# Patient Record
Sex: Male | Born: 1935 | Race: White | Hispanic: No | Marital: Married | Smoking: Former smoker
Health system: Southern US, Community
[De-identification: ages and names within clinical notes are randomized; demographics above are authoritative.]

## PROBLEM LIST (undated history)

## (undated) DIAGNOSIS — Z720 Tobacco use: Secondary | ICD-10-CM

## (undated) DIAGNOSIS — I10 Essential (primary) hypertension: Secondary | ICD-10-CM

## (undated) DIAGNOSIS — C801 Malignant (primary) neoplasm, unspecified: Secondary | ICD-10-CM

## (undated) DIAGNOSIS — R0602 Shortness of breath: Secondary | ICD-10-CM

## (undated) DIAGNOSIS — C19 Malignant neoplasm of rectosigmoid junction: Secondary | ICD-10-CM

## (undated) DIAGNOSIS — Z433 Encounter for attention to colostomy: Secondary | ICD-10-CM

## (undated) DIAGNOSIS — E1140 Type 2 diabetes mellitus with diabetic neuropathy, unspecified: Secondary | ICD-10-CM

## (undated) DIAGNOSIS — I519 Heart disease, unspecified: Secondary | ICD-10-CM

## (undated) DIAGNOSIS — I714 Abdominal aortic aneurysm, without rupture: Secondary | ICD-10-CM

## (undated) DIAGNOSIS — M1990 Unspecified osteoarthritis, unspecified site: Secondary | ICD-10-CM

## (undated) DIAGNOSIS — J449 Chronic obstructive pulmonary disease, unspecified: Secondary | ICD-10-CM

## (undated) DIAGNOSIS — C183 Malignant neoplasm of hepatic flexure: Secondary | ICD-10-CM

## (undated) DIAGNOSIS — N179 Acute kidney failure, unspecified: Secondary | ICD-10-CM

## (undated) DIAGNOSIS — I4891 Unspecified atrial fibrillation: Secondary | ICD-10-CM

## (undated) HISTORY — DX: Heart disease, unspecified: I51.9

## (undated) HISTORY — DX: Shortness of breath: R06.02

## (undated) HISTORY — DX: Malignant (primary) neoplasm, unspecified: C80.1

## (undated) HISTORY — DX: Malignant neoplasm of hepatic flexure: C18.3

## (undated) HISTORY — DX: Unspecified osteoarthritis, unspecified site: M19.90

## (undated) HISTORY — DX: Essential (primary) hypertension: I10

## (undated) HISTORY — PX: COSMETIC SURGERY: SHX468

## (undated) HISTORY — PX: BACK SURGERY: SHX140

## (undated) HISTORY — DX: Malignant neoplasm of rectosigmoid junction: C19

## (undated) HISTORY — PX: OTHER SURGICAL HISTORY: SHX169

---

## 1990-05-04 HISTORY — PX: OTHER SURGICAL HISTORY: SHX169

## 1998-10-15 ENCOUNTER — Ambulatory Visit (HOSPITAL_BASED_OUTPATIENT_CLINIC_OR_DEPARTMENT_OTHER): Admission: RE | Admit: 1998-10-15 | Discharge: 1998-10-15 | Payer: Self-pay | Admitting: Orthopaedic Surgery

## 2003-01-05 ENCOUNTER — Encounter: Payer: Self-pay | Admitting: Cardiovascular Disease

## 2003-01-05 ENCOUNTER — Encounter: Admission: RE | Admit: 2003-01-05 | Discharge: 2003-01-05 | Payer: Self-pay | Admitting: Cardiovascular Disease

## 2005-01-23 ENCOUNTER — Ambulatory Visit (HOSPITAL_COMMUNITY): Admission: RE | Admit: 2005-01-23 | Discharge: 2005-01-23 | Payer: Self-pay | Admitting: Neurology

## 2005-02-11 ENCOUNTER — Ambulatory Visit (HOSPITAL_COMMUNITY): Admission: RE | Admit: 2005-02-11 | Discharge: 2005-02-11 | Payer: Self-pay | Admitting: Internal Medicine

## 2009-12-20 ENCOUNTER — Encounter: Payer: Self-pay | Admitting: Internal Medicine

## 2010-02-01 HISTORY — PX: EXPLORATORY LAPAROTOMY: SUR591

## 2010-02-01 HISTORY — PX: OTHER SURGICAL HISTORY: SHX169

## 2010-02-01 HISTORY — PX: COLOSTOMY: SHX63

## 2010-02-06 ENCOUNTER — Ambulatory Visit: Payer: Self-pay | Admitting: Internal Medicine

## 2010-02-06 ENCOUNTER — Encounter (INDEPENDENT_AMBULATORY_CARE_PROVIDER_SITE_OTHER): Payer: Self-pay | Admitting: *Deleted

## 2010-02-06 DIAGNOSIS — K921 Melena: Secondary | ICD-10-CM

## 2010-02-06 DIAGNOSIS — R634 Abnormal weight loss: Secondary | ICD-10-CM

## 2010-02-06 DIAGNOSIS — K6289 Other specified diseases of anus and rectum: Secondary | ICD-10-CM

## 2010-02-06 DIAGNOSIS — R198 Other specified symptoms and signs involving the digestive system and abdomen: Secondary | ICD-10-CM

## 2010-02-06 LAB — CONVERTED CEMR LAB
ALT: 15 units/L (ref 0–53)
AST: 21 units/L (ref 0–37)
Albumin: 3.1 g/dL — ABNORMAL LOW (ref 3.5–5.2)
Alkaline Phosphatase: 69 units/L (ref 39–117)
BUN: 19 mg/dL (ref 6–23)
Basophils Absolute: 0.1 10*3/uL (ref 0.0–0.1)
Basophils Relative: 0.7 % (ref 0.0–3.0)
CO2: 28 meq/L (ref 19–32)
Calcium: 9 mg/dL (ref 8.4–10.5)
Chloride: 102 meq/L (ref 96–112)
Creatinine, Ser: 1.1 mg/dL (ref 0.4–1.5)
Eosinophils Absolute: 0.3 10*3/uL (ref 0.0–0.7)
Eosinophils Relative: 3.6 % (ref 0.0–5.0)
GFR calc non Af Amer: 73.22 mL/min (ref >60)
Glucose, Bld: 92 mg/dL (ref 70–99)
HCT: 35.3 % — ABNORMAL LOW (ref 39.0–52.0)
Hemoglobin: 12 g/dL — ABNORMAL LOW (ref 13.0–17.0)
INR: 3.2 — ABNORMAL HIGH (ref 0.8–1.0)
Lymphocytes Relative: 21.1 % (ref 12.0–46.0)
Lymphs Abs: 1.6 10*3/uL (ref 0.7–4.0)
MCHC: 34.1 g/dL (ref 30.0–36.0)
MCV: 90.9 fL (ref 78.0–100.0)
Monocytes Absolute: 0.6 10*3/uL (ref 0.1–1.0)
Monocytes Relative: 7.6 % (ref 3.0–12.0)
Neutro Abs: 5.2 10*3/uL (ref 1.4–7.7)
Neutrophils Relative %: 67 % (ref 43.0–77.0)
Platelets: 246 10*3/uL (ref 150.0–400.0)
Potassium: 4.5 meq/L (ref 3.5–5.1)
Prothrombin Time: 33.8 s — ABNORMAL HIGH (ref 9.7–11.8)
RBC: 3.88 M/uL — ABNORMAL LOW (ref 4.22–5.81)
RDW: 16 % — ABNORMAL HIGH (ref 11.5–14.6)
Sodium: 136 meq/L (ref 135–145)
Total Bilirubin: 0.5 mg/dL (ref 0.3–1.2)
Total Protein: 6.4 g/dL (ref 6.0–8.3)
WBC: 7.7 10*3/uL (ref 4.5–10.5)

## 2010-02-07 ENCOUNTER — Encounter: Payer: Self-pay | Admitting: Internal Medicine

## 2010-02-07 ENCOUNTER — Ambulatory Visit: Payer: Self-pay | Admitting: Pulmonary Disease

## 2010-02-07 ENCOUNTER — Ambulatory Visit: Payer: Self-pay | Admitting: Gastroenterology

## 2010-02-07 ENCOUNTER — Telehealth: Payer: Self-pay | Admitting: Internal Medicine

## 2010-02-07 ENCOUNTER — Encounter (INDEPENDENT_AMBULATORY_CARE_PROVIDER_SITE_OTHER): Payer: Self-pay | Admitting: Internal Medicine

## 2010-02-07 ENCOUNTER — Inpatient Hospital Stay (HOSPITAL_COMMUNITY): Admission: EM | Admit: 2010-02-07 | Discharge: 2010-02-24 | Payer: Self-pay | Admitting: Emergency Medicine

## 2010-02-09 ENCOUNTER — Encounter (INDEPENDENT_AMBULATORY_CARE_PROVIDER_SITE_OTHER): Payer: Self-pay | Admitting: Internal Medicine

## 2010-02-10 ENCOUNTER — Encounter (INDEPENDENT_AMBULATORY_CARE_PROVIDER_SITE_OTHER): Payer: Self-pay | Admitting: Internal Medicine

## 2010-02-10 ENCOUNTER — Ambulatory Visit: Payer: Self-pay | Admitting: Vascular Surgery

## 2010-02-11 ENCOUNTER — Ambulatory Visit: Payer: Self-pay | Admitting: Oncology

## 2010-02-11 ENCOUNTER — Encounter: Payer: Self-pay | Admitting: Gastroenterology

## 2010-02-13 ENCOUNTER — Encounter (INDEPENDENT_AMBULATORY_CARE_PROVIDER_SITE_OTHER): Payer: Self-pay | Admitting: Internal Medicine

## 2010-03-03 ENCOUNTER — Encounter: Payer: Self-pay | Admitting: Internal Medicine

## 2010-03-04 ENCOUNTER — Ambulatory Visit: Payer: Self-pay | Admitting: Oncology

## 2010-03-07 ENCOUNTER — Ambulatory Visit: Payer: Self-pay | Admitting: Oncology

## 2010-03-07 ENCOUNTER — Encounter: Payer: Self-pay | Admitting: Internal Medicine

## 2010-03-07 ENCOUNTER — Ambulatory Visit
Admission: RE | Admit: 2010-03-07 | Discharge: 2010-05-30 | Payer: Self-pay | Source: Home / Self Care | Attending: Radiation Oncology | Admitting: Radiation Oncology

## 2010-03-07 LAB — COMPREHENSIVE METABOLIC PANEL
ALT: 10 U/L (ref 0–53)
BUN: 16 mg/dL (ref 6–23)
CO2: 30 mEq/L (ref 19–32)
Creatinine, Ser: 1.25 mg/dL (ref 0.40–1.50)
Total Bilirubin: 0.5 mg/dL (ref 0.3–1.2)

## 2010-03-07 LAB — CBC WITH DIFFERENTIAL/PLATELET
BASO%: 0.8 % (ref 0.0–2.0)
EOS%: 4.1 % (ref 0.0–7.0)
HCT: 32 % — ABNORMAL LOW (ref 38.4–49.9)
LYMPH%: 21.2 % (ref 14.0–49.0)
MCH: 30.5 pg (ref 27.2–33.4)
MCHC: 33.1 g/dL (ref 32.0–36.0)
MONO#: 0.6 10*3/uL (ref 0.1–0.9)
NEUT%: 65.4 % (ref 39.0–75.0)
Platelets: 267 10*3/uL (ref 140–400)

## 2010-03-07 LAB — CEA: CEA: 12.7 ng/mL — ABNORMAL HIGH (ref 0.0–5.0)

## 2010-04-04 ENCOUNTER — Encounter: Payer: Self-pay | Admitting: Internal Medicine

## 2010-04-04 LAB — CBC WITH DIFFERENTIAL/PLATELET
BASO%: 0.8 % (ref 0.0–2.0)
EOS%: 3.2 % (ref 0.0–7.0)
HCT: 34.7 % — ABNORMAL LOW (ref 38.4–49.9)
LYMPH%: 24.3 % (ref 14.0–49.0)
MCH: 30.7 pg (ref 27.2–33.4)
MCHC: 33.5 g/dL (ref 32.0–36.0)
NEUT%: 62.2 % (ref 39.0–75.0)
Platelets: 171 10*3/uL (ref 140–400)

## 2010-04-04 LAB — COMPREHENSIVE METABOLIC PANEL
ALT: 12 U/L (ref 0–53)
AST: 15 U/L (ref 0–37)
Creatinine, Ser: 1.09 mg/dL (ref 0.40–1.50)
Total Bilirubin: 0.6 mg/dL (ref 0.3–1.2)

## 2010-04-18 ENCOUNTER — Ambulatory Visit: Payer: Self-pay | Admitting: Oncology

## 2010-04-24 ENCOUNTER — Encounter: Payer: Self-pay | Admitting: Internal Medicine

## 2010-05-16 ENCOUNTER — Encounter: Payer: Self-pay | Admitting: Internal Medicine

## 2010-05-16 LAB — PROTIME-INR
INR: 3.4 (ref 2.00–3.50)
Protime: 40.8 Seconds — ABNORMAL HIGH (ref 10.6–13.4)

## 2010-05-16 LAB — CBC WITH DIFFERENTIAL/PLATELET
BASO%: 0.5 % (ref 0.0–2.0)
Basophils Absolute: 0 10*3/uL (ref 0.0–0.1)
EOS%: 4.3 % (ref 0.0–7.0)
Eosinophils Absolute: 0.2 10*3/uL (ref 0.0–0.5)
HCT: 31.5 % — ABNORMAL LOW (ref 38.4–49.9)
HGB: 10.9 g/dL — ABNORMAL LOW (ref 13.0–17.1)
LYMPH%: 8.1 % — ABNORMAL LOW (ref 14.0–49.0)
MCH: 32.6 pg (ref 27.2–33.4)
MCHC: 34.6 g/dL (ref 32.0–36.0)
MCV: 94.3 fL (ref 79.3–98.0)
MONO#: 0.5 10*3/uL (ref 0.1–0.9)
MONO%: 10.8 % (ref 0.0–14.0)
NEUT#: 3.4 10*3/uL (ref 1.5–6.5)
NEUT%: 76.3 % — ABNORMAL HIGH (ref 39.0–75.0)
Platelets: 151 10*3/uL (ref 140–400)
RBC: 3.35 10*6/uL — ABNORMAL LOW (ref 4.20–5.82)
RDW: 19.9 % — ABNORMAL HIGH (ref 11.0–14.6)
WBC: 4.5 10*3/uL (ref 4.0–10.3)
lymph#: 0.4 10*3/uL — ABNORMAL LOW (ref 0.9–3.3)

## 2010-05-16 LAB — COMPREHENSIVE METABOLIC PANEL
ALT: 9 U/L (ref 0–53)
AST: 10 U/L (ref 0–37)
Albumin: 3.9 g/dL (ref 3.5–5.2)
Alkaline Phosphatase: 56 U/L (ref 39–117)
BUN: 19 mg/dL (ref 6–23)
CO2: 24 mEq/L (ref 19–32)
Calcium: 8.9 mg/dL (ref 8.4–10.5)
Chloride: 105 mEq/L (ref 96–112)
Creatinine, Ser: 1.14 mg/dL (ref 0.40–1.50)
Glucose, Bld: 116 mg/dL — ABNORMAL HIGH (ref 70–99)
Potassium: 4.1 mEq/L (ref 3.5–5.3)
Sodium: 140 mEq/L (ref 135–145)
Total Bilirubin: 0.7 mg/dL (ref 0.3–1.2)
Total Protein: 6.5 g/dL (ref 6.0–8.3)

## 2010-05-27 ENCOUNTER — Ambulatory Visit (HOSPITAL_COMMUNITY)
Admission: RE | Admit: 2010-05-27 | Discharge: 2010-05-27 | Payer: Self-pay | Source: Home / Self Care | Attending: Oncology | Admitting: Oncology

## 2010-05-27 ENCOUNTER — Ambulatory Visit: Payer: Self-pay | Admitting: Oncology

## 2010-05-29 ENCOUNTER — Encounter: Payer: Self-pay | Admitting: Internal Medicine

## 2010-05-29 LAB — CBC WITH DIFFERENTIAL/PLATELET
BASO%: 0.1 % (ref 0.0–2.0)
Basophils Absolute: 0 10*3/uL (ref 0.0–0.1)
EOS%: 3.7 % (ref 0.0–7.0)
Eosinophils Absolute: 0.1 10*3/uL (ref 0.0–0.5)
HCT: 31.3 % — ABNORMAL LOW (ref 38.4–49.9)
HGB: 10.8 g/dL — ABNORMAL LOW (ref 13.0–17.1)
MCH: 33.2 pg (ref 27.2–33.4)
MCHC: 34.5 g/dL (ref 32.0–36.0)
MCV: 96.2 fL (ref 79.3–98.0)
MONO%: 9 % (ref 0.0–14.0)
NEUT%: 80.1 % — ABNORMAL HIGH (ref 39.0–75.0)
Platelets: 114 10*3/uL — ABNORMAL LOW (ref 140–400)
RDW: 20.8 % — ABNORMAL HIGH (ref 11.0–14.6)
WBC: 3.6 10*3/uL — ABNORMAL LOW (ref 4.0–10.3)

## 2010-05-29 LAB — COMPREHENSIVE METABOLIC PANEL
ALT: 10 U/L (ref 0–53)
AST: 14 U/L (ref 0–37)
Albumin: 3.5 g/dL (ref 3.5–5.2)
Alkaline Phosphatase: 56 U/L (ref 39–117)
BUN: 14 mg/dL (ref 6–23)
CO2: 28 mEq/L (ref 19–32)
Calcium: 8.5 mg/dL (ref 8.4–10.5)
Chloride: 104 mEq/L (ref 96–112)
Creatinine, Ser: 1.13 mg/dL (ref 0.40–1.50)
Potassium: 3.7 mEq/L (ref 3.5–5.3)
Total Bilirubin: 0.6 mg/dL (ref 0.3–1.2)

## 2010-05-29 LAB — PROTIME-INR: INR: 1.8 — ABNORMAL LOW (ref 2.00–3.50)

## 2010-06-03 NOTE — Procedures (Signed)
Summary: Colonoscopy  Patient: [name] Note: All result statuses are Final unless otherwise noted.  Tests: (1) Colonoscopy (COL)   COL Colonoscopy           DONE     [facility]     [address] [place], [place]  [address]           COLONOSCOPY PROCEDURE REPORT           PATIENT:  Vincent [name], Vincent [name]  MR#:  [id number]     BIRTHDATE:  12-05-1932, 76 yrs. old  GENDER:  male     ENDOSCOPIST:  Judie Petit T. Russella Dar, MD, [facility]     Referred by:  Tracey Harries, M.D.     PROCEDURE DATE:  02/07/2010     PROCEDURE:  Incomplete colonoscopy with decompression and     decompression tube placement     ASA CLASS:  Class III     INDICATIONS:  1) Abnormal CT of abdomen with  rectosigmoid mass     and LBO  2) hematochezia     MEDICATIONS:   Fentanyl 25 mcg IV, Versed 1.5 mg IV     DESCRIPTION OF PROCEDURE:   After the risks benefits and     alternatives of the procedure were thoroughly explained, informed     consent was obtained.  Digital rectal exam was performed and     revealed no abnormalities.  The Pentax Colonoscope [id number]     endoscope was introduced through the anus and advanced to the     sigmoid colon, limited by poor preparation, unable to advance     beyond the sigmoid colon due to solid stool.  The quality of the     prep was poor, using MoviPrep.  The instrument was then slowly     withdrawn as the colon was fully examined.     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 It was ulcerated, irregularly shaped and malignant     appearing measuring about 5 cm in length. Multiple biopsies were     obtained and sent to pathology.  Normal colon otherwise in the     sigmoid colon with a large amout of solid stool compromising the     exam. Decompression was performed and then a 10 fr decompression     tube was placed 20-25 cm proximal to the mass.  Retroflexed views     in the rectum revealed no abnormalities.  The scope was  then     withdrawn from the patient and the procedure completed.           COMPLICATIONS:  None           ENDOSCOPIC IMPRESSION:     1) Obstructing malignant appearing mass at the rectosigmoid     junction     2) Normal sigmoid colon with decompression and decompression     tube placed           RECOMMENDATIONS:     1) Await pathology results     2) Surgical consult     3) Post procedure abd films           Malcolm T. Russella Dar, MD, [facility]           CC: Stan Head, MD           n.     Rosalie DoctorVenita Lick. Stark at 02/07/2010 02:44  PM           [name], [id number]  Note: An exclamation mark (!) indicates a result that was not dispersed into the flowsheet. Document Creation Date: 02/07/2010 2:45 PM _______________________________________________________________________  (1) Order result status: Final Collection or observation date-time: 02/07/2010 14:22 Requested date-time:  Receipt date-time:  Reported date-time:  Referring Physician:   Ordering Physician: Claudette Head [phone]) Specimen Source:  Source: Launa Grill Order Number: [id number] Lab site:

## 2010-06-03 NOTE — Letter (Signed)
Summary: [facility] Instructions  [facility]  [address] [place], [place] [address]   Phone: [phone]  Fax: [phone]       EMERIL STILLE    75-28-1934    MRN: [id number]      Procedure Day Dorna Bloom: Farrell Ours, 02/07/10     Arrival Time: 7:30 AM      Procedure Time: 8:00 AM    Location of Procedure:                    _X_  [facility] (4th Floor)  PREPARATION FOR COLONOSCOPY WITH MOVIPREP   Starting today do not take any fiber supplements (e.g. Metamucil, Citrucel, and Benefiber).  THE DAY BEFORE YOUR PROCEDURE         THURSDAY, 02/06/10  1.  Drink clear liquids the entire day-NO SOLID FOOD  2.  Do not drink anything colored red or purple.  Avoid juices with pulp.  No orange juice.  3.  Drink at least 64 oz. (8 glasses) of fluid/clear liquids during the day to prevent dehydration and help the prep work efficiently.  CLEAR LIQUIDS INCLUDE: Water Jello Ice Popsicles Tea (sugar ok, no milk/cream) Powdered fruit flavored drinks Coffee (sugar ok, no milk/cream) Gatorade Juice: apple, white grape, white cranberry  Lemonade Clear bullion, consomm, broth Carbonated beverages (any kind) Strained chicken noodle soup Hard Candy                           4.  In the morning, mix first dose of MoviPrep solution:    Empty 1 Pouch A and 1 Pouch B into the disposable container    Add lukewarm drinking water to the top line of the container. Mix to dissolve    Refrigerate (mixed solution should be used within 24 hrs)  5.  Begin drinking the prep at 5:00 p.m. The MoviPrep container is divided by 4 marks.   Every 15 minutes drink the solution down to the next mark (approximately 8 oz) until the full liter is complete.   6.  Follow completed prep with 16 oz of clear liquid of your choice (Nothing red or purple).  Continue to drink clear liquids.  7.  Mix second dose of MoviPrep solution:    Empty 1 Pouch A and 1 Pouch B into the disposable  container    Add lukewarm drinking water to the top line of the container. Mix to dissolve    Refrigerate  Beginning at 9:00 p.m. (5 hours before procedure):         1. Every 15 minutes, drink the solution down to the next mark (approx 8 oz) until the full liter is complete.         2. Follow completed prep with 16 oz. of clear liquid of your choice.    THE DAY OF YOUR PROCEDURE      FRIDAY, 02/07/10  1. You may drink clear liquids until 6:00AM (2 HOURS BEFORE PROCEDURE).  MEDICATION INSTRUCTIONS  Unless otherwise instructed, you should take regular prescription medications with a small sip of water   as early as possible the morning of your procedure.  Stop taking Coumadin on  _ _  (5 days before procedure).  PER DR. Leone Payor DO NOT STOP YOUR COUMADIN.       OTHER INSTRUCTIONS  You will need a responsible adult at least75 years of age to accompany you and drive you home.   This  person must remain in the waiting room during your procedure.  Wear loose fitting clothing that is easily removed.  Leave jewelry and other valuables at home.  However, you may wish to bring a book to read or  an iPod/MP3 player to listen to music as you wait for your procedure to start.  Remove all body piercing jewelry and leave at home.  Total time from sign-in until discharge is approximately 2-3 hours.  You should go home directly after your procedure and rest.  You can resume normal activities the  day after your procedure.  The day of your procedure you should not:   Drive   Make legal decisions   Operate machinery   Drink alcohol   Return to work  You will receive specific instructions about eating, activities and medications before you leave.  The above instructions have been reviewed and explained to me by   _______________________   I fully understand and can verbalize these instructions _____________________________ Date _________

## 2010-06-03 NOTE — Letter (Signed)
Summary: New Patient letter  [facility]  [address] [place], [place] [address]   Phone: [phone]  Fax: [phone]       12/20/2007 MRN: [id number]  Vincent [name] [address] [place], [place]  [address]  Dear Mr. BROADFOOT,  Welcome to the Gastroenterology Division at [facility].    You are scheduled to see Dr.  Leone Payor on 02-06-10 at 10am on the 3rd floor at [facility], [address]  We ask that you try to arrive at our office 15 minutes prior to your appointment time to allow for check-in.  We would like you to complete the enclosed self-administered evaluation form prior to your visit and bring it with you on the day of your appointment.  We will review it with you.  Also, please bring a complete list of all your medications or, if you prefer, bring the medication bottles and we will list them.  Please bring your insurance card so that we may make a copy of it.  If your insurance requires a referral to see a specialist, please bring your referral form from your primary care physician.  Co-payments are due at the time of your visit and may be paid by cash, check or credit card.     Your office visit will consist of a consult with your physician (includes a physical exam), any laboratory testing he/she may order, scheduling of any necessary diagnostic testing (e.g. x-ray, ultrasound, CT-scan), and scheduling of a procedure (e.g. Endoscopy, Colonoscopy) if required.  Please allow enough time on your schedule to allow for any/all of these possibilities.    If you cannot keep your appointment, please call [phone] to cancel or reschedule prior to your appointment date.  This allows [place] the opportunity to schedule an appointment for another patient in need of care.  If you do not cancel or reschedule by 5 p.m. the business day prior to your appointment date, you will be charged a $50.00 late cancellation/no-show fee.    Thank you for choosing [facility] for your medical needs.  We appreciate the opportunity to care for you.  Please visit [place] at our website  to learn more about our practice.                     Sincerely,                                                             The Gastroenterology Division

## 2010-06-03 NOTE — Letter (Signed)
Summary: [facility]  [facility]   Imported By: Sherian Rein 02/21/2010 08:38:05  _____________________________________________________________________  External Attachment:    Type:   Image     Comment:   External Document

## 2010-06-03 NOTE — Letter (Signed)
Summary: Medication List/[facility]  Medication List/[facility]   Imported By: Sherian Rein 02/21/2010 08:36:48  _____________________________________________________________________  External Attachment:    Type:   Image     Comment:   External Document

## 2010-06-03 NOTE — Assessment & Plan Note (Signed)
Summary: SCREEN FOR COLON-ON BLD THNRS/YF   History of Present Illness Visit Type: Initial Consult Primary GI MD: Stan Head MD [facility] Primary Provider: Tracey Harries, MD Requesting Provider: Tracey Harries, MD Chief Complaint: passing blood History of Present Illness:   77 yo wm with recent unusual bleeding inthat he passed a clump of "dried, clooted blood" Has had hemorroids since teenage years and has seen bright red blood on the yissue paper. Lately he has had increased frequency of stools, (new) loose and incomplete defecation. This disturbs sleep also. "I always have gas real bad". It is new and began in the past couple of months. No new meds and no antibiotics. No new pets or foreign travel. He says he was 288# 3 years ago and had lost 100# in that time. He is also having pain in coccyx and rectal area.    GI Review of Systems    Reports bloating and  weight loss.   Weight loss of 8 pounds over 1 week.   Denies abdominal pain, acid reflux, belching, chest pain, dysphagia with liquids, dysphagia with solids, heartburn, loss of appetite, nausea, vomiting, vomiting blood, and  weight gain.      Reports change in bowel habits, fecal incontinence, hemorrhoids, rectal bleeding, and  rectal pain.     Denies anal fissure, black tarry stools, constipation, diarrhea, diverticulosis, heme positive stool, irritable bowel syndrome, jaundice, light color stool, and  liver problems.    Current Medications (verified): 1)  Altace 2.5 Mg Caps (Ramipril) .... Once Daily 2)  Carvedilol 6.25 Mg Tabs (Carvedilol) .... Take 1 Tablet By Mouth Two Times A Day 3)  Aspirin 81 Mg Tabs (Aspirin) .... Take 1 Tablet By Mouth Once A Day 4)  Digoxin 0.125 Mg Tabs (Digoxin) .... Take 1 Tablet By Mouth Once A Day 5)  Warfarin Sodium 5 Mg Tabs (Warfarin Sodium) .... Take 1 Tablet By Mouth Once Daily 4 Days A Week, Take 1/2 Tablet By Mouth Once Daily 3 Days A Week 6)  Omega-3 1000 Mg Caps (Omega-3 Fatty Acids)  .... Take 1 Capsule By Mouth Once A Day 7)  Advair Diskus 250-50 Mcg/dose Aepb (Fluticasone-Salmeterol) .... One Inhalation Every 12 Hours 8)  Combivent 18-103 Mcg/act Aero (Ipratropium-Albuterol) .... Take Two Inhalations Four Times A Day 9)  Simvastatin 40 Mg Tabs (Simvastatin) .... Take 1 Tablet By Mouth Once A Day 10)  Mytussin Ac 100-10 Mg/68ml Syrp (Guaifenesin-Codeine) .... As Needed 11)  Tramadol Hcl 50 Mg Tabs (Tramadol Hcl) .... As Needed  Allergies (verified): 1)  ! * Swordfish  Past History:  Past Medical History: Reviewed history from 02/05/2010 and no changes required. Heart Disease-Cardiomyopathy Thrombocytopenia Arthritis Asthma Atrial Fibrillation COPD Hemorrhoids Hyperlipidemia Hypertension Sleep Apnea  Past Surgical History: Back Surgery 1973, 1986 Knee Arthroscopy 1993  Family History: Family History of Colon Cancer: Father died at 27, dx 12's Family History of Prostate Cancer: Father Family History of Diabetes: Son Family History of Heart Disease: Mother, MI-deceased 62   Social History: Married to [employer],  Part-Time, Engineering geologist for [employer] Daily Caffeine Use 2-4 glasses Patient is a former smoker. quit in 2004 Alcohol Use - no Illicit Drug Use - no Patient does not get regular exercise.   Review of Systems       The patient complains of arthritis/joint pain, fatigue, shortness of breath, sleeping problems, and swelling of feet/legs.         All other ROS negative except as per HPI.   Vital Signs:  Patient profile:   77 year old male Height:      71 inches Weight:      199 pounds BMI:     27.86 Pulse rate:   64 / minute Pulse rhythm:   regular BP sitting:   100 / 58  (left arm) Cuff size:   regular  Vitals Entered By: June McMurray CMA Duncan Dull) (February 06, 2010 9:59 AM) CC: passing blood   Physical Exam  General:  Well developed, well nourished, no acute distress. Eyes:  PERRLA, no icterus. Mouth:   dentures dry, erythematous mucosa Neck:  Supple; no masses or thyromegaly. Lungs:  decreased BS bilateral.  coarse BS bilateral Heart:  Regular rate and rhythm; no murmurs, rubs,  or bruits. Distant sounds Abdomen:  soft, doughy, no HSM/mass BS+ nontender except mild in groin Rectal:  perianal ok hard mass like-effect palpated at dstal edge of finger, watery brown stool  moderately tender Prostate:  not assessed Extremities:  trce edema ankles and pretibial bilateral Neurologic:  Alert and  oriented x3 Skin:  diffusely dry and scaly Cervical Nodes:  No significant cervical or supraclavicular adenopathy.  Inguinal Nodes:  No significant inguinal adenopathy. Psych:  Alert and cooperative. Normal mood and affect.  Labs from earlier in 2011 and office notes from Dr. Everlene Other have been reviewed.Will bve scanned into record. Hgb has been normal as late as 05/2009  Impression & Recommendations:  Problem # 1:  HEMATOCHEZIA (ICD-578.1) Assessment New He needs colonoscopy to sort out. At higher risk with Afib and warfarin. Rectal exam is suggesting rectal cancer. given overall situation and the palpated rectal mass, proceed to colonoscopy tomorrow. bxs should be ok on coumadin   Orders: Colonoscopy (Colon) Risks, benefits,and indications of endoscopic procedure(s) were reviewed with the patient and all questions answered. Increased risks due to warfarin have been explained.  Problem # 2:  RECTAL MASS (ICD-787.99) Assessment: New  Orders: Colonoscopy (Colon)  Problem # 3:  WEIGHT LOSS, ABNORMAL (ICD-783.21) await colonoscopy Orders: TLB-CBC Platelet - w/Differential (85025-CBCD) TLB-CMP (Comprehensive Metabolic Pnl) (80053-COMP)  Problem # 4:  RECTAL PAIN (TDD-220.25) Assessment: New if he hhas rectal cancer that would explain it  Problem # 5:  COUMADIN THERAPY (ICD-V58.61) Assessment: Comment Only Dr. Allyson Sabal coordinates this for his A fib.   Orders: TLB-PT (Protime)  (85610-PTP)  Patient Instructions: 1)  Please go to the basement to have your lab tests drawn today.  2)  We will see you at your procedure on 02/07/10. 3)  [facility] Patient Information Guide given to patient.  4)  Colonoscopy and Flexible Sigmoidoscopy brochure given.  5)  Copy sent to : Tracey Harries, MD 6)  The medication list was reviewed and reconciled.  All changed / newly prescribed medications were explained.  A complete medication list was provided to the patient / caregiver. also cc Dr. Nanetta Batty

## 2010-06-03 NOTE — Progress Notes (Signed)
Summary: cx fee?  Phone Note From Other Clinic   Caller: Patient Call For: Dr. Leone Payor Caller: Lillia Abed, nurse with [facility] ER Call For: Dr. Leone Payor Summary of Call: nurse called from [facility] ER to report that pt has been in ERall night and this morning for abd pain... pt will not be comingin for COL and will c/b at a later time to resch... pt reported that after taking prep he experienced severe abd pain and never urinated or had a BM since Dr. Leone Payor, do you wish to charge this pt a late cx fee? Initial call taken by: Vallarie Mare,  February 07, 2010 9:13 AM  Follow-up for Phone Call        NO Follow-up by: Iva Boop MD, [facility] Graham,  February 07, 2010 9:21 AM  Additional Follow-up for Phone Call Additional follow up Details #1::        Patient NOT BILLED. Additional Follow-up by: Leanor Kail [facility],  February 12, 2010 12:44 PM

## 2010-06-03 NOTE — Procedures (Signed)
Summary: Flexible Sigmoidoscopy  Patient: Vincent [name] Note: All result statuses are Final unless otherwise noted.  Tests: (1) Flexible Sigmoidoscopy (FLX)  FLX Flexible Sigmoidoscopy                             DONE     [facility]     [address] [place], [place]  [address]           FLEXIBLE SIGMOIDOSCOPY PROCEDURE REPORT           PATIENT:  Vincent, [name]  MR#:  [id number]     BIRTHDATE:  1933/01/07, 76 yrs. old  GENDER:  male     ENDOSCOPIST:  Judie Petit T. 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     MEDICATIONS:   Fentanyl 75 mcg IV, Versed 4 mg IV     DESCRIPTION OF PROCEDURE:   After the risks benefits and     alternatives of the procedure were thoroughly explained, informed     consent was obtained.  Digital rectal exam was performed and     revealed no abnormalities.   The  endoscope was introduced through     the anus and advanced to the descending colon, limited by fair     preparation.  The quality of the prep was fair with tubid liquid     stool above the mass. The instrument was then slowly withdrawn as     the mucosa was fully examined.     <<PROCEDUREIMAGES>>           A mass was found from 10-18 cm at the3 rectosigmoid junction. It     was obstructing, malignant and ulcerated. The indwelling     decompression tube was removed. A 90 mm x 22 mm uncovered     Wallstent was placed under fluoroscopy after marking the proximal     and distal ends of the tumor with  submucosal radiopaque dye. The     stent bridged the tumor by fluoroscopic and endoscopic     visualization. The proximal sigmoid colon and descending colon     were normal in appearance. Retroflexed views in the rectum     revealed no abnormalities.  The scope was then  withdrawn from the     patient and the procedure terminated.           COMPLICATIONS:  None           ENDOSCOPIC IMPRESSION:     1) Obstucting rectosigmoid mass, stented           RECOMMENDATIONS:     1) Surgery     2) Gentle laxatives           Iviona Hole T. Russella Dar, MD, [facility]           CC:  Tracey Harries, MD   Stan Head, MD   Darnell Level, MD           n.     Rosalie DoctorVenita Lick. Daviel Allegretto at 02/11/2010 01:33 PM           [name], [id number]  Note:  An exclamation mark (!) indicates a result that was not dispersed into the flowsheet. Document Creation Date: 02/11/2010 1:33 PM _______________________________________________________________________  (1) Order result status: Final Collection or observation date-time: 02/11/2010 13:20 Requested date-time:  Receipt date-time:  Reported date-time:  Referring Physician:   Ordering Physician: Claudette Head [phone]) Specimen Source:  Source: Launa Grill Order Number: [id number] Lab site:

## 2010-06-03 NOTE — Progress Notes (Signed)
  Phone Note Outgoing Call   Call placed by: Clide Cliff RN,  February 07, 2010 8:29 AM Summary of Call: Attempted to call patient to see shw he had a NOS this am.   No answer at his home phone with no ID.  Called the work phone and the lady stated that he won't be back until Tuesday morning.  Unable to reach patient. Initial call taken by: Clide Cliff RN,  February 07, 2010 8:31 AM

## 2010-06-03 NOTE — Letter (Signed)
Summary: [facility]   Imported By: Sherian Rein 04/02/2010 11:11:38  _____________________________________________________________________  External Attachment:    Type:   Image     Comment:   External Document

## 2010-06-03 NOTE — Letter (Signed)
Summary: [facility]  [facility]   Imported By: Lennie Odor 03/17/2010 14:21:14  _____________________________________________________________________  External Attachment:    Type:   Image     Comment:   External Document

## 2010-06-03 NOTE — Letter (Signed)
Summary: [facility]  [facility]   Imported By: Sherian Rein 04/11/2010 07:22:56  _____________________________________________________________________  External Attachment:    Type:   Image     Comment:   External Document

## 2010-06-04 ENCOUNTER — Ambulatory Visit: Payer: Self-pay | Admitting: Radiation Oncology

## 2010-06-04 ENCOUNTER — Ambulatory Visit (HOSPITAL_COMMUNITY)
Admission: RE | Admit: 2010-06-04 | Discharge: 2010-06-04 | Disposition: A | Discharge status: Home / Self Care | Payer: Medicare HMO | Attending: Surgery | Admitting: Surgery

## 2010-06-04 ENCOUNTER — Ambulatory Visit (HOSPITAL_COMMUNITY): Payer: Medicare HMO

## 2010-06-04 DIAGNOSIS — Z01818 Encounter for other preprocedural examination: Secondary | ICD-10-CM | POA: Insufficient documentation

## 2010-06-04 DIAGNOSIS — C20 Malignant neoplasm of rectum: Secondary | ICD-10-CM | POA: Insufficient documentation

## 2010-06-04 LAB — DIFFERENTIAL
Basophils Absolute: 0 10*3/uL (ref 0.0–0.1)
Basophils Relative: 1 % (ref 0–1)
Eosinophils Absolute: 0.2 10*3/uL (ref 0.0–0.7)
Eosinophils Relative: 4 % (ref 0–5)
Lymphocytes Relative: 8 % — ABNORMAL LOW (ref 12–46)
Lymphs Abs: 0.4 10*3/uL — ABNORMAL LOW (ref 0.7–4.0)
Monocytes Absolute: 0.5 10*3/uL (ref 0.1–1.0)
Monocytes Relative: 10 % (ref 3–12)
Neutro Abs: 3.6 10*3/uL (ref 1.7–7.7)
Neutrophils Relative %: 76 % (ref 43–77)

## 2010-06-04 LAB — PROTIME-INR
INR: 1.07 (ref 0.00–1.49)
Prothrombin Time: 14.1 seconds (ref 11.6–15.2)

## 2010-06-04 LAB — BASIC METABOLIC PANEL
Chloride: 104 mEq/L (ref 96–112)
GFR calc non Af Amer: >60 mL/min (ref >60)
Glucose, Bld: 134 mg/dL — ABNORMAL HIGH (ref 70–99)
Potassium: 4.2 mEq/L (ref 3.5–5.1)
Sodium: 141 mEq/L (ref 135–145)

## 2010-06-04 LAB — CBC
HCT: 36.5 % — ABNORMAL LOW (ref 39.0–52.0)
Hemoglobin: 11.9 g/dL — ABNORMAL LOW (ref 13.0–17.0)
MCH: 32.1 pg (ref 26.0–34.0)
MCHC: 32.6 g/dL (ref 30.0–36.0)
MCV: 98.4 fL (ref 78.0–100.0)
Platelets: 142 10*3/uL — ABNORMAL LOW (ref 150–400)
RBC: 3.71 MIL/uL — ABNORMAL LOW (ref 4.22–5.81)
RDW: 18.6 % — ABNORMAL HIGH (ref 11.5–15.5)
WBC: 4.7 10*3/uL (ref 4.0–10.5)

## 2010-06-04 LAB — SURGICAL PCR SCREEN: Staphylococcus aureus: POSITIVE — AB

## 2010-06-05 NOTE — Letter (Signed)
Summary: [facility]   Imported By: Lennie Odor 05/16/2010 17:13:14  _____________________________________________________________________  External Attachment:    Type:   Image     Comment:   External Document

## 2010-06-05 NOTE — Letter (Signed)
Summary: Antony Blackbird MD/[facility]  Antony Blackbird MD/[facility]   Imported By: Lester Lathrop 05/28/2010 10:56:50  _____________________________________________________________________  External Attachment:    Type:   Image     Comment:   External Document

## 2010-06-10 ENCOUNTER — Other Ambulatory Visit: Payer: Self-pay | Admitting: Oncology

## 2010-06-10 ENCOUNTER — Encounter (HOSPITAL_BASED_OUTPATIENT_CLINIC_OR_DEPARTMENT_OTHER): Payer: Medicare HMO | Admitting: Oncology

## 2010-06-10 DIAGNOSIS — Z7901 Long term (current) use of anticoagulants: Secondary | ICD-10-CM

## 2010-06-10 DIAGNOSIS — Z5111 Encounter for antineoplastic chemotherapy: Secondary | ICD-10-CM

## 2010-06-10 DIAGNOSIS — C19 Malignant neoplasm of rectosigmoid junction: Secondary | ICD-10-CM

## 2010-06-10 DIAGNOSIS — T451X5A Adverse effect of antineoplastic and immunosuppressive drugs, initial encounter: Secondary | ICD-10-CM

## 2010-06-10 LAB — CBC WITH DIFFERENTIAL/PLATELET
Basophils Absolute: 0 10*3/uL (ref 0.0–0.1)
Eosinophils Absolute: 0.2 10*3/uL (ref 0.0–0.5)
HGB: 11.8 g/dL — ABNORMAL LOW (ref 13.0–17.1)
MONO#: 0.4 10*3/uL (ref 0.1–0.9)
NEUT#: 3.3 10*3/uL (ref 1.5–6.5)
RBC: 3.65 10*6/uL — ABNORMAL LOW (ref 4.20–5.82)
RDW: 18 % — ABNORMAL HIGH (ref 11.0–14.6)
WBC: 4.4 10*3/uL (ref 4.0–10.3)
nRBC: 0 % (ref 0–0)

## 2010-06-10 LAB — COMPREHENSIVE METABOLIC PANEL
ALT: 8 U/L (ref 0–53)
Albumin: 3.5 g/dL (ref 3.5–5.2)
CO2: 26 mEq/L (ref 19–32)
Calcium: 8.8 mg/dL (ref 8.4–10.5)
Chloride: 104 mEq/L (ref 96–112)
Potassium: 3.8 mEq/L (ref 3.5–5.3)
Sodium: 141 mEq/L (ref 135–145)
Total Protein: 6.2 g/dL (ref 6.0–8.3)

## 2010-06-12 ENCOUNTER — Encounter (HOSPITAL_BASED_OUTPATIENT_CLINIC_OR_DEPARTMENT_OTHER): Payer: Medicare HMO | Admitting: Oncology

## 2010-06-12 DIAGNOSIS — C19 Malignant neoplasm of rectosigmoid junction: Secondary | ICD-10-CM

## 2010-06-19 ENCOUNTER — Ambulatory Visit: Payer: Medicare HMO | Attending: Radiation Oncology | Admitting: Radiation Oncology

## 2010-06-19 NOTE — Letter (Signed)
Summary: [facility]  [facility]   Imported By: Sherian Rein 06/09/2010 11:40:42  _____________________________________________________________________  External Attachment:    Type:   Image     Comment:   External Document

## 2010-06-24 ENCOUNTER — Encounter: Payer: Self-pay | Admitting: Internal Medicine

## 2010-06-24 ENCOUNTER — Other Ambulatory Visit: Payer: Self-pay | Admitting: Oncology

## 2010-06-24 ENCOUNTER — Encounter (HOSPITAL_BASED_OUTPATIENT_CLINIC_OR_DEPARTMENT_OTHER): Payer: Medicare HMO | Admitting: Oncology

## 2010-06-24 DIAGNOSIS — I4891 Unspecified atrial fibrillation: Secondary | ICD-10-CM

## 2010-06-24 DIAGNOSIS — Z5111 Encounter for antineoplastic chemotherapy: Secondary | ICD-10-CM

## 2010-06-24 DIAGNOSIS — D6481 Anemia due to antineoplastic chemotherapy: Secondary | ICD-10-CM

## 2010-06-24 DIAGNOSIS — C19 Malignant neoplasm of rectosigmoid junction: Secondary | ICD-10-CM

## 2010-06-24 DIAGNOSIS — T451X5A Adverse effect of antineoplastic and immunosuppressive drugs, initial encounter: Secondary | ICD-10-CM

## 2010-06-24 DIAGNOSIS — Z7901 Long term (current) use of anticoagulants: Secondary | ICD-10-CM

## 2010-06-24 LAB — COMPREHENSIVE METABOLIC PANEL
ALT: <8 U/L (ref 0–53)
AST: 14 U/L (ref 0–37)
Calcium: 9.2 mg/dL (ref 8.4–10.5)
Chloride: 108 mEq/L (ref 96–112)
Creatinine, Ser: 1.07 mg/dL (ref 0.40–1.50)

## 2010-06-24 LAB — CBC WITH DIFFERENTIAL/PLATELET
BASO%: 0.4 % (ref 0.0–2.0)
LYMPH%: 8.4 % — ABNORMAL LOW (ref 14.0–49.0)
MCHC: 33.8 g/dL (ref 32.0–36.0)
MONO#: 0.3 10*3/uL (ref 0.1–0.9)
Platelets: 101 10*3/uL — ABNORMAL LOW (ref 140–400)
RBC: 3.49 10*6/uL — ABNORMAL LOW (ref 4.20–5.82)
RDW: 17.1 % — ABNORMAL HIGH (ref 11.0–14.6)
WBC: 4.7 10*3/uL (ref 4.0–10.3)
lymph#: 0.4 10*3/uL — ABNORMAL LOW (ref 0.9–3.3)
nRBC: 0 % (ref 0–0)

## 2010-06-26 ENCOUNTER — Encounter (HOSPITAL_BASED_OUTPATIENT_CLINIC_OR_DEPARTMENT_OTHER): Payer: Medicare HMO | Admitting: Oncology

## 2010-06-26 DIAGNOSIS — C19 Malignant neoplasm of rectosigmoid junction: Secondary | ICD-10-CM

## 2010-06-26 NOTE — Op Note (Signed)
NAMEBARNIE, SOPKO NO.:  [id number]  MEDICAL RECORD NO.:  [id number]           PATIENT TYPE:  O  LOCATION:  SDSC                         FACILITY:  [facility]  PHYSICIAN:  Velora Heckler, MD      DATE OF BIRTH:  07/20/1932  DATE OF PROCEDURE:  06/04/2010 DATE OF DISCHARGE:                              OPERATIVE REPORT   PREOPERATIVE DIAGNOSIS:  Adenocarcinoma of the rectum.  POSTOPERATIVE DIAGNOSIS:  Adenocarcinoma of the rectum.  PROCEDURE:  Placement of left subclavian vein infusion port.  SURGEON:  Velora Heckler, MD  ANESTHESIA:  Local with intravenous sedation.  PREPARATION:  ChloraPrep.  COMPLICATIONS:  None.  BLOOD LOSS:  Minimal.  INDICATIONS:  The patient is a 77 year old white male with known locally advanced rectal carcinoma.  He has received adjuvant radiation therapy. He is now to begin adjuvant chemotherapy.  The Medical Oncology Service desires a chronic venous access device.  BODY OF REPORT:  Procedure was done in OR #5 at the [facility].  The patient was brought to the operating room, placed in a supine position on the operating room table.  Following administration of intravenous sedation, the patient was positioned and then prepped and draped in the usual strict aseptic fashion.  After ascertaining that an adequate level of sedation had been achieved, the skin beneath the left clavicle was anesthetized with local anesthetic. The patient was placed in Trendelenburg.  Using an 18-gauge Seldinger needle, the left subclavian vein was entered in a single pass.  A soft J- shaped guidewire was advanced into position and the needle removed.  Skin of the left chest wall was anesthetized with local anesthetic.  A 2- cm incision was made with a #15 blade.  Subcutaneous pocket was created to accommodate the infusion port.  An 8-French PowerPort single lumen was selected.  It was assembled and flushed with heparinized  saline. Port was inserted into the subcutaneous pocket.  Catheter was tunneled subcutaneously from the pocket to the site of guidewire insertion. Catheter was divided at 26 cm.  Using a dilator and peel-away sleeve, the catheter was introduced into the left subclavian vein without difficulty, and at this point the guidewire, dilator, and peel-away sleeve have all been removed.  C-arm fluoroscopy was employed to confirm guidewire placement and then to confirm catheter placement.  Catheter aspirates blood easily and flushes easily with heparinized saline.  Port was then flushed with 4 mL of 1000 units/mL heparin.  Port was sutured to the chest wall with two interrupted 2-0 Prolene sutures.  Subcutaneous tissues were closed with interrupted 3-0 Vicryl sutures.  Skin was closed with a running 4-0 Vicryl subcuticular suture. Wound was washed and dried, and Benzoin and Steri-Strips were applied. Sterile dressings were applied.  The patient was awakened from sedation and brought to the recovery room.  The patient tolerated the procedure well.     Velora Heckler, MD     TMG/MEDQ  D:  06/04/2010  T:  06/05/2010  Job:  [id number]  cc:   [facility].  Electronically Signed by Darnell Level  MD on 06/26/2010 10:31:02 AM

## 2010-07-01 NOTE — Letter (Signed)
Summary: [name] MD/[facility]  [name] MD/[facility]   Imported By: Lester [place] 06/24/2010 09:57:20  _____________________________________________________________________  External Attachment:    Type:   Image     Comment:   External Document

## 2010-07-08 ENCOUNTER — Encounter (HOSPITAL_BASED_OUTPATIENT_CLINIC_OR_DEPARTMENT_OTHER): Payer: Medicare HMO | Admitting: Oncology

## 2010-07-08 ENCOUNTER — Encounter: Payer: Self-pay | Admitting: Internal Medicine

## 2010-07-08 ENCOUNTER — Other Ambulatory Visit: Payer: Self-pay | Admitting: Oncology

## 2010-07-08 DIAGNOSIS — D6481 Anemia due to antineoplastic chemotherapy: Secondary | ICD-10-CM

## 2010-07-08 DIAGNOSIS — Z5111 Encounter for antineoplastic chemotherapy: Secondary | ICD-10-CM

## 2010-07-08 DIAGNOSIS — C19 Malignant neoplasm of rectosigmoid junction: Secondary | ICD-10-CM

## 2010-07-08 DIAGNOSIS — I4891 Unspecified atrial fibrillation: Secondary | ICD-10-CM

## 2010-07-08 DIAGNOSIS — T451X5A Adverse effect of antineoplastic and immunosuppressive drugs, initial encounter: Secondary | ICD-10-CM

## 2010-07-08 DIAGNOSIS — Z7901 Long term (current) use of anticoagulants: Secondary | ICD-10-CM

## 2010-07-08 LAB — COMPREHENSIVE METABOLIC PANEL
Albumin: 3.5 g/dL (ref 3.5–5.2)
BUN: 15 mg/dL (ref 6–23)
CO2: 21 mEq/L (ref 19–32)
Calcium: 8.6 mg/dL (ref 8.4–10.5)
Chloride: 106 mEq/L (ref 96–112)
Creatinine, Ser: 1.02 mg/dL (ref 0.40–1.50)
Glucose, Bld: 122 mg/dL — ABNORMAL HIGH (ref 70–99)
Potassium: 3.8 mEq/L (ref 3.5–5.3)

## 2010-07-08 LAB — CBC WITH DIFFERENTIAL/PLATELET
Basophils Absolute: 0 10*3/uL (ref 0.0–0.1)
Eosinophils Absolute: 0.1 10*3/uL (ref 0.0–0.5)
HCT: 32.7 % — ABNORMAL LOW (ref 38.4–49.9)
HGB: 11.1 g/dL — ABNORMAL LOW (ref 13.0–17.1)
MCH: 32.5 pg (ref 27.2–33.4)
MONO#: 0.4 10*3/uL (ref 0.1–0.9)
NEUT#: 3.1 10*3/uL (ref 1.5–6.5)
NEUT%: 74.1 % (ref 39.0–75.0)
lymph#: 0.5 10*3/uL — ABNORMAL LOW (ref 0.9–3.3)

## 2010-07-10 ENCOUNTER — Encounter (HOSPITAL_BASED_OUTPATIENT_CLINIC_OR_DEPARTMENT_OTHER): Payer: Medicare HMO | Admitting: Oncology

## 2010-07-10 DIAGNOSIS — Z7901 Long term (current) use of anticoagulants: Secondary | ICD-10-CM

## 2010-07-10 DIAGNOSIS — C19 Malignant neoplasm of rectosigmoid junction: Secondary | ICD-10-CM

## 2010-07-15 NOTE — Letter (Signed)
Summary: [facility]  [facility]   Imported By: Lennie Odor 07/10/2010 11:41:19  _____________________________________________________________________  External Attachment:    Type:   Image     Comment:   External Document

## 2010-07-16 LAB — CBC
HCT: 24.9 % — ABNORMAL LOW (ref 39.0–52.0)
HCT: 26.4 % — ABNORMAL LOW (ref 39.0–52.0)
HCT: 26.7 % — ABNORMAL LOW (ref 39.0–52.0)
HCT: 27.3 % — ABNORMAL LOW (ref 39.0–52.0)
Hemoglobin: 8.1 g/dL — ABNORMAL LOW (ref 13.0–17.0)
Hemoglobin: 8.3 g/dL — ABNORMAL LOW (ref 13.0–17.0)
Hemoglobin: 8.9 g/dL — ABNORMAL LOW (ref 13.0–17.0)
Hemoglobin: 9.2 g/dL — ABNORMAL LOW (ref 13.0–17.0)
MCHC: 33.8 g/dL (ref 30.0–36.0)
MCV: 90.8 fL (ref 78.0–100.0)
MCV: 91 fL (ref 78.0–100.0)
Platelets: 201 10*3/uL (ref 150–400)
Platelets: 218 10*3/uL (ref 150–400)
Platelets: 245 10*3/uL (ref 150–400)
RBC: 2.6 MIL/uL — ABNORMAL LOW (ref 4.22–5.81)
RBC: 2.66 MIL/uL — ABNORMAL LOW (ref 4.22–5.81)
RBC: 2.76 MIL/uL — ABNORMAL LOW (ref 4.22–5.81)
RBC: 2.93 MIL/uL — ABNORMAL LOW (ref 4.22–5.81)
RBC: 2.94 MIL/uL — ABNORMAL LOW (ref 4.22–5.81)
RDW: 16.2 % — ABNORMAL HIGH (ref 11.5–15.5)
RDW: 16.5 % — ABNORMAL HIGH (ref 11.5–15.5)
RDW: 16.5 % — ABNORMAL HIGH (ref 11.5–15.5)
WBC: 12.1 10*3/uL — ABNORMAL HIGH (ref 4.0–10.5)
WBC: 5.7 10*3/uL (ref 4.0–10.5)
WBC: 5.7 10*3/uL (ref 4.0–10.5)
WBC: 6.8 10*3/uL (ref 4.0–10.5)
WBC: 7.5 10*3/uL (ref 4.0–10.5)
WBC: 9.8 10*3/uL (ref 4.0–10.5)

## 2010-07-16 LAB — GLUCOSE, CAPILLARY
Glucose-Capillary: 105 mg/dL — ABNORMAL HIGH (ref 70–99)
Glucose-Capillary: 113 mg/dL — ABNORMAL HIGH (ref 70–99)
Glucose-Capillary: 114 mg/dL — ABNORMAL HIGH (ref 70–99)
Glucose-Capillary: 121 mg/dL — ABNORMAL HIGH (ref 70–99)
Glucose-Capillary: 130 mg/dL — ABNORMAL HIGH (ref 70–99)
Glucose-Capillary: 130 mg/dL — ABNORMAL HIGH (ref 70–99)
Glucose-Capillary: 135 mg/dL — ABNORMAL HIGH (ref 70–99)
Glucose-Capillary: 145 mg/dL — ABNORMAL HIGH (ref 70–99)
Glucose-Capillary: 155 mg/dL — ABNORMAL HIGH (ref 70–99)
Glucose-Capillary: 174 mg/dL — ABNORMAL HIGH (ref 70–99)
Glucose-Capillary: 175 mg/dL — ABNORMAL HIGH (ref 70–99)
Glucose-Capillary: 177 mg/dL — ABNORMAL HIGH (ref 70–99)
Glucose-Capillary: 184 mg/dL — ABNORMAL HIGH (ref 70–99)
Glucose-Capillary: 185 mg/dL — ABNORMAL HIGH (ref 70–99)
Glucose-Capillary: 193 mg/dL — ABNORMAL HIGH (ref 70–99)
Glucose-Capillary: 89 mg/dL (ref 70–99)
Glucose-Capillary: 92 mg/dL (ref 70–99)

## 2010-07-16 LAB — COMPREHENSIVE METABOLIC PANEL
ALT: 11 U/L (ref 0–53)
ALT: 24 U/L (ref 0–53)
ALT: 27 U/L (ref 0–53)
AST: 17 U/L (ref 0–37)
AST: 19 U/L (ref 0–37)
AST: 19 U/L (ref 0–37)
Albumin: 1.7 g/dL — ABNORMAL LOW (ref 3.5–5.2)
Albumin: 1.9 g/dL — ABNORMAL LOW (ref 3.5–5.2)
Alkaline Phosphatase: 36 U/L — ABNORMAL LOW (ref 39–117)
Alkaline Phosphatase: 39 U/L (ref 39–117)
Alkaline Phosphatase: 49 U/L (ref 39–117)
Alkaline Phosphatase: 66 U/L (ref 39–117)
Alkaline Phosphatase: 69 U/L (ref 39–117)
BUN: 15 mg/dL (ref 6–23)
BUN: 15 mg/dL (ref 6–23)
BUN: 18 mg/dL (ref 6–23)
CO2: 27 mEq/L (ref 19–32)
CO2: 28 mEq/L (ref 19–32)
Calcium: 7.6 mg/dL — ABNORMAL LOW (ref 8.4–10.5)
Calcium: 8.3 mg/dL — ABNORMAL LOW (ref 8.4–10.5)
Chloride: 94 mEq/L — ABNORMAL LOW (ref 96–112)
Chloride: 99 mEq/L (ref 96–112)
Creatinine, Ser: 1.02 mg/dL (ref 0.4–1.5)
GFR calc Af Amer: >60 mL/min (ref >60)
GFR calc Af Amer: >60 mL/min (ref >60)
GFR calc Af Amer: >60 mL/min (ref >60)
GFR calc non Af Amer: 58 mL/min — ABNORMAL LOW (ref >60)
GFR calc non Af Amer: 58 mL/min — ABNORMAL LOW (ref >60)
Glucose, Bld: 100 mg/dL — ABNORMAL HIGH (ref 70–99)
Glucose, Bld: 124 mg/dL — ABNORMAL HIGH (ref 70–99)
Glucose, Bld: 175 mg/dL — ABNORMAL HIGH (ref 70–99)
Glucose, Bld: 176 mg/dL — ABNORMAL HIGH (ref 70–99)
Potassium: 3.9 mEq/L (ref 3.5–5.1)
Potassium: 3.9 mEq/L (ref 3.5–5.1)
Potassium: 3.9 mEq/L (ref 3.5–5.1)
Potassium: 4.5 mEq/L (ref 3.5–5.1)
Potassium: 4.8 mEq/L (ref 3.5–5.1)
Sodium: 135 mEq/L (ref 135–145)
Sodium: 135 mEq/L (ref 135–145)
Total Bilirubin: 0.9 mg/dL (ref 0.3–1.2)
Total Bilirubin: 0.9 mg/dL (ref 0.3–1.2)
Total Protein: 3.5 g/dL — ABNORMAL LOW (ref 6.0–8.3)
Total Protein: 4 g/dL — ABNORMAL LOW (ref 6.0–8.3)
Total Protein: 4.5 g/dL — ABNORMAL LOW (ref 6.0–8.3)

## 2010-07-16 LAB — MAGNESIUM
Magnesium: 1.8 mg/dL (ref 1.5–2.5)
Magnesium: 1.8 mg/dL (ref 1.5–2.5)
Magnesium: 1.9 mg/dL (ref 1.5–2.5)
Magnesium: 1.9 mg/dL (ref 1.5–2.5)

## 2010-07-16 LAB — PROTIME-INR
INR: 1.27 (ref 0.00–1.49)
INR: 1.31 (ref 0.00–1.49)
INR: 1.38 (ref 0.00–1.49)
INR: 1.49 (ref 0.00–1.49)
Prothrombin Time: 14.8 seconds (ref 11.6–15.2)
Prothrombin Time: 15.5 seconds — ABNORMAL HIGH (ref 11.6–15.2)
Prothrombin Time: 16.1 seconds — ABNORMAL HIGH (ref 11.6–15.2)
Prothrombin Time: 17.2 seconds — ABNORMAL HIGH (ref 11.6–15.2)

## 2010-07-16 LAB — DIFFERENTIAL
Basophils Absolute: 0 10*3/uL (ref 0.0–0.1)
Basophils Relative: 0 % (ref 0–1)
Basophils Relative: 0 % (ref 0–1)
Basophils Relative: 1 % (ref 0–1)
Eosinophils Absolute: 0.2 10*3/uL (ref 0.0–0.7)
Eosinophils Relative: 2 % (ref 0–5)
Eosinophils Relative: 4 % (ref 0–5)
Lymphocytes Relative: 10 % — ABNORMAL LOW (ref 12–46)
Lymphocytes Relative: 8 % — ABNORMAL LOW (ref 12–46)
Lymphs Abs: 1.2 10*3/uL (ref 0.7–4.0)
Monocytes Absolute: 0.6 10*3/uL (ref 0.1–1.0)
Monocytes Relative: 12 % (ref 3–12)
Monocytes Relative: 6 % (ref 3–12)
Neutro Abs: 4.9 10*3/uL (ref 1.7–7.7)
Neutro Abs: 8.2 10*3/uL — ABNORMAL HIGH (ref 1.7–7.7)
Neutrophils Relative %: 62 % (ref 43–77)
Neutrophils Relative %: 77 % (ref 43–77)

## 2010-07-16 LAB — BLOOD GAS, ARTERIAL
Drawn by: 31276
O2 Content: 4 L/min
Patient temperature: 98.6
TCO2: 22.7 mmol/L (ref 0–100)
pCO2 arterial: 42.3 mmHg (ref 35.0–45.0)
pCO2 arterial: 49.5 mmHg — ABNORMAL HIGH (ref 35.0–45.0)
pH, Arterial: 7.305 — ABNORMAL LOW (ref 7.350–7.450)
pH, Arterial: 7.432 (ref 7.350–7.450)
pO2, Arterial: 75.3 mmHg — ABNORMAL LOW (ref 80.0–100.0)

## 2010-07-16 LAB — TRIGLYCERIDES: Triglycerides: 123 mg/dL (ref <150)

## 2010-07-16 LAB — PREALBUMIN
Prealbumin: 12.3 mg/dL — ABNORMAL LOW (ref 18.0–45.0)
Prealbumin: 7 mg/dL — ABNORMAL LOW (ref 18.0–45.0)

## 2010-07-16 LAB — BASIC METABOLIC PANEL
BUN: 15 mg/dL (ref 6–23)
BUN: 16 mg/dL (ref 6–23)
Creatinine, Ser: 1.03 mg/dL (ref 0.4–1.5)
GFR calc Af Amer: >60 mL/min (ref >60)
GFR calc non Af Amer: >60 mL/min (ref >60)
GFR calc non Af Amer: >60 mL/min (ref >60)
Potassium: 3.9 mEq/L (ref 3.5–5.1)
Potassium: 4.3 mEq/L (ref 3.5–5.1)
Sodium: 133 mEq/L — ABNORMAL LOW (ref 135–145)

## 2010-07-16 LAB — PROCALCITONIN: Procalcitonin: 1.24 ng/mL

## 2010-07-16 LAB — CHOLESTEROL, TOTAL
Cholesterol: 58 mg/dL (ref 0–200)
Cholesterol: 96 mg/dL (ref 0–200)

## 2010-07-16 LAB — HEMOGLOBIN AND HEMATOCRIT, BLOOD
HCT: 27.4 % — ABNORMAL LOW (ref 39.0–52.0)
Hemoglobin: 9.6 g/dL — ABNORMAL LOW (ref 13.0–17.0)

## 2010-07-16 LAB — PHOSPHORUS
Phosphorus: 2.8 mg/dL (ref 2.3–4.6)
Phosphorus: 3.5 mg/dL (ref 2.3–4.6)
Phosphorus: 3.6 mg/dL (ref 2.3–4.6)

## 2010-07-17 LAB — COMPREHENSIVE METABOLIC PANEL
ALT: 15 U/L (ref 0–53)
AST: 17 U/L (ref 0–37)
AST: 28 U/L (ref 0–37)
Albumin: 1.9 g/dL — ABNORMAL LOW (ref 3.5–5.2)
Albumin: 1.9 g/dL — ABNORMAL LOW (ref 3.5–5.2)
Albumin: 2.1 g/dL — ABNORMAL LOW (ref 3.5–5.2)
Albumin: 3.4 g/dL — ABNORMAL LOW (ref 3.5–5.2)
Alkaline Phosphatase: 47 U/L (ref 39–117)
Alkaline Phosphatase: 67 U/L (ref 39–117)
BUN: 14 mg/dL (ref 6–23)
BUN: 20 mg/dL (ref 6–23)
BUN: 25 mg/dL — ABNORMAL HIGH (ref 6–23)
BUN: 32 mg/dL — ABNORMAL HIGH (ref 6–23)
CO2: 27 mEq/L (ref 19–32)
CO2: 27 mEq/L (ref 19–32)
CO2: 28 mEq/L (ref 19–32)
Calcium: 7.7 mg/dL — ABNORMAL LOW (ref 8.4–10.5)
Calcium: 9 mg/dL (ref 8.4–10.5)
Chloride: 104 mEq/L (ref 96–112)
Chloride: 105 mEq/L (ref 96–112)
Creatinine, Ser: 0.98 mg/dL (ref 0.4–1.5)
Creatinine, Ser: 1.11 mg/dL (ref 0.4–1.5)
Creatinine, Ser: 1.28 mg/dL (ref 0.4–1.5)
GFR calc Af Amer: >60 mL/min (ref >60)
GFR calc Af Amer: >60 mL/min (ref >60)
GFR calc Af Amer: >60 mL/min (ref >60)
GFR calc non Af Amer: 55 mL/min — ABNORMAL LOW (ref >60)
GFR calc non Af Amer: 56 mL/min — ABNORMAL LOW (ref >60)
GFR calc non Af Amer: >60 mL/min (ref >60)
GFR calc non Af Amer: >60 mL/min (ref >60)
Glucose, Bld: 204 mg/dL — ABNORMAL HIGH (ref 70–99)
Glucose, Bld: 99 mg/dL (ref 70–99)
Potassium: 3.5 mEq/L (ref 3.5–5.1)
Potassium: 4.5 mEq/L (ref 3.5–5.1)
Sodium: 143 mEq/L (ref 135–145)
Total Bilirubin: 0.5 mg/dL (ref 0.3–1.2)
Total Bilirubin: 1.1 mg/dL (ref 0.3–1.2)
Total Bilirubin: 1.4 mg/dL — ABNORMAL HIGH (ref 0.3–1.2)
Total Protein: 5.3 g/dL — ABNORMAL LOW (ref 6.0–8.3)
Total Protein: 6.8 g/dL (ref 6.0–8.3)

## 2010-07-17 LAB — CBC
HCT: 32.2 % — ABNORMAL LOW (ref 39.0–52.0)
HCT: 32.5 % — ABNORMAL LOW (ref 39.0–52.0)
HCT: 37.8 % — ABNORMAL LOW (ref 39.0–52.0)
HCT: 38.6 % — ABNORMAL LOW (ref 39.0–52.0)
Hemoglobin: 10.8 g/dL — ABNORMAL LOW (ref 13.0–17.0)
Hemoglobin: 10.8 g/dL — ABNORMAL LOW (ref 13.0–17.0)
Hemoglobin: 12.8 g/dL — ABNORMAL LOW (ref 13.0–17.0)
MCH: 30.2 pg (ref 26.0–34.0)
MCH: 30.2 pg (ref 26.0–34.0)
MCH: 30.5 pg (ref 26.0–34.0)
MCH: 30.6 pg (ref 26.0–34.0)
MCHC: 33 g/dL (ref 30.0–36.0)
MCHC: 33.4 g/dL (ref 30.0–36.0)
MCHC: 33.5 g/dL (ref 30.0–36.0)
MCV: 90 fL (ref 78.0–100.0)
MCV: 90.3 fL (ref 78.0–100.0)
MCV: 90.4 fL (ref 78.0–100.0)
MCV: 90.9 fL (ref 78.0–100.0)
MCV: 92.1 fL (ref 78.0–100.0)
Platelets: 180 10*3/uL (ref 150–400)
Platelets: 191 10*3/uL (ref 150–400)
Platelets: 192 10*3/uL (ref 150–400)
Platelets: 199 10*3/uL (ref 150–400)
Platelets: 206 10*3/uL (ref 150–400)
RBC: 3.42 MIL/uL — ABNORMAL LOW (ref 4.22–5.81)
RBC: 3.47 MIL/uL — ABNORMAL LOW (ref 4.22–5.81)
RBC: 3.54 MIL/uL — ABNORMAL LOW (ref 4.22–5.81)
RBC: 3.64 MIL/uL — ABNORMAL LOW (ref 4.22–5.81)
RDW: 15.9 % — ABNORMAL HIGH (ref 11.5–15.5)
RDW: 16.1 % — ABNORMAL HIGH (ref 11.5–15.5)
RDW: 16.2 % — ABNORMAL HIGH (ref 11.5–15.5)
RDW: 16.4 % — ABNORMAL HIGH (ref 11.5–15.5)
RDW: 16.4 % — ABNORMAL HIGH (ref 11.5–15.5)
RDW: 16.7 % — ABNORMAL HIGH (ref 11.5–15.5)
WBC: 12.6 10*3/uL — ABNORMAL HIGH (ref 4.0–10.5)
WBC: 12.7 10*3/uL — ABNORMAL HIGH (ref 4.0–10.5)
WBC: 12.9 10*3/uL — ABNORMAL HIGH (ref 4.0–10.5)
WBC: 8.2 10*3/uL (ref 4.0–10.5)
WBC: 8.3 10*3/uL (ref 4.0–10.5)
WBC: 9.4 10*3/uL (ref 4.0–10.5)

## 2010-07-17 LAB — PROTIME-INR
INR: 1.21 (ref 0.00–1.49)
INR: 1.25 (ref 0.00–1.49)
INR: 1.28 (ref 0.00–1.49)
INR: 1.3 (ref 0.00–1.49)
INR: 1.36 (ref 0.00–1.49)
INR: 1.52 — ABNORMAL HIGH (ref 0.00–1.49)
INR: 2.08 — ABNORMAL HIGH (ref 0.00–1.49)
INR: 2.74 — ABNORMAL HIGH (ref 0.00–1.49)
Prothrombin Time: 15.9 seconds — ABNORMAL HIGH (ref 11.6–15.2)
Prothrombin Time: 16.4 seconds — ABNORMAL HIGH (ref 11.6–15.2)
Prothrombin Time: 23.5 seconds — ABNORMAL HIGH (ref 11.6–15.2)
Prothrombin Time: 29.1 seconds — ABNORMAL HIGH (ref 11.6–15.2)

## 2010-07-17 LAB — RENAL FUNCTION PANEL
Albumin: 1.9 g/dL — ABNORMAL LOW (ref 3.5–5.2)
Calcium: 8.1 mg/dL — ABNORMAL LOW (ref 8.4–10.5)
Creatinine, Ser: 0.93 mg/dL (ref 0.4–1.5)
GFR calc Af Amer: >60 mL/min (ref >60)
GFR calc non Af Amer: >60 mL/min (ref >60)

## 2010-07-17 LAB — URINALYSIS, ROUTINE W REFLEX MICROSCOPIC
Bilirubin Urine: NEGATIVE
Hgb urine dipstick: NEGATIVE
Nitrite: NEGATIVE
Specific Gravity, Urine: 1.023 (ref 1.005–1.030)
Urobilinogen, UA: 1 mg/dL (ref 0.0–1.0)

## 2010-07-17 LAB — CARDIAC PANEL(CRET KIN+CKTOT+MB+TROPI)
CK, MB: 1.7 ng/mL (ref 0.3–4.0)
Relative Index: INVALID (ref 0.0–2.5)
Total CK: 19 U/L (ref 7–232)
Total CK: 24 U/L (ref 7–232)
Troponin I: 0.02 ng/mL (ref 0.00–0.06)
Troponin I: 0.02 ng/mL (ref 0.00–0.06)

## 2010-07-17 LAB — BASIC METABOLIC PANEL
BUN: 14 mg/dL (ref 6–23)
BUN: 29 mg/dL — ABNORMAL HIGH (ref 6–23)
CO2: 29 mEq/L (ref 19–32)
Calcium: 7.9 mg/dL — ABNORMAL LOW (ref 8.4–10.5)
Chloride: 108 mEq/L (ref 96–112)
Creatinine, Ser: 0.88 mg/dL (ref 0.4–1.5)
GFR calc Af Amer: >60 mL/min (ref >60)
GFR calc non Af Amer: 56 mL/min — ABNORMAL LOW (ref >60)
GFR calc non Af Amer: 59 mL/min — ABNORMAL LOW (ref >60)
GFR calc non Af Amer: >60 mL/min (ref >60)
Glucose, Bld: 123 mg/dL — ABNORMAL HIGH (ref 70–99)
Potassium: 4.2 mEq/L (ref 3.5–5.1)
Sodium: 140 mEq/L (ref 135–145)
Sodium: 141 mEq/L (ref 135–145)

## 2010-07-17 LAB — DIFFERENTIAL
Basophils Absolute: 0 10*3/uL (ref 0.0–0.1)
Basophils Absolute: 0 10*3/uL (ref 0.0–0.1)
Basophils Relative: 0 % (ref 0–1)
Eosinophils Absolute: 0 10*3/uL (ref 0.0–0.7)
Eosinophils Relative: 1 % (ref 0–5)
Lymphocytes Relative: 7 % — ABNORMAL LOW (ref 12–46)
Lymphs Abs: 1 10*3/uL (ref 0.7–4.0)
Lymphs Abs: 1.4 10*3/uL (ref 0.7–4.0)
Monocytes Absolute: 0.1 10*3/uL (ref 0.1–1.0)
Monocytes Relative: 1 % — ABNORMAL LOW (ref 3–12)
Neutro Abs: 17.6 10*3/uL — ABNORMAL HIGH (ref 1.7–7.7)
Neutrophils Relative %: 88 % — ABNORMAL HIGH (ref 43–77)

## 2010-07-17 LAB — MRSA PCR SCREENING: MRSA by PCR: NEGATIVE

## 2010-07-17 LAB — POCT I-STAT, CHEM 8
BUN: 32 mg/dL — ABNORMAL HIGH (ref 6–23)
Calcium, Ion: 1.1 mmol/L — ABNORMAL LOW (ref 1.12–1.32)
Creatinine, Ser: 1.3 mg/dL (ref 0.4–1.5)
Glucose, Bld: 196 mg/dL — ABNORMAL HIGH (ref 70–99)
HCT: 42 % (ref 39.0–52.0)
Hemoglobin: 14.3 g/dL (ref 13.0–17.0)
Sodium: 140 mEq/L (ref 135–145)
TCO2: 28 mmol/L (ref 0–100)

## 2010-07-17 LAB — TSH: TSH: 1.421 u[IU]/mL (ref 0.350–4.500)

## 2010-07-17 LAB — TYPE AND SCREEN
Antibody Screen: NEGATIVE
Unit division: 0
Unit division: 0
Unit division: 0

## 2010-07-17 LAB — BLOOD GAS, ARTERIAL
Acid-base deficit: 1.6 mmol/L (ref 0.0–2.0)
Drawn by: 23588
O2 Content: 10 L/min
O2 Saturation: 96.8 %
TCO2: 24.4 mmol/L (ref 0–100)
pO2, Arterial: 104 mmHg — ABNORMAL HIGH (ref 80.0–100.0)

## 2010-07-17 LAB — CULTURE, ROUTINE-ABSCESS

## 2010-07-17 LAB — MAGNESIUM
Magnesium: 1.9 mg/dL (ref 1.5–2.5)
Magnesium: 2.1 mg/dL (ref 1.5–2.5)

## 2010-07-17 LAB — CEA: CEA: 5.4 ng/mL — ABNORMAL HIGH (ref 0.0–5.0)

## 2010-07-17 LAB — ANAEROBIC CULTURE

## 2010-07-17 LAB — HEMOGLOBIN A1C: Mean Plasma Glucose: 126 mg/dL — ABNORMAL HIGH (ref <117)

## 2010-07-17 LAB — DIGOXIN LEVEL: Digoxin Level: 0.3 ng/mL — ABNORMAL LOW (ref 0.8–2.0)

## 2010-07-17 LAB — LIPASE, BLOOD: Lipase: 22 U/L (ref 11–59)

## 2010-07-17 LAB — HEMOGLOBIN AND HEMATOCRIT, BLOOD: HCT: 32.6 % — ABNORMAL LOW (ref 39.0–52.0)

## 2010-07-22 ENCOUNTER — Encounter (HOSPITAL_BASED_OUTPATIENT_CLINIC_OR_DEPARTMENT_OTHER): Payer: Medicare HMO | Admitting: Oncology

## 2010-07-22 ENCOUNTER — Other Ambulatory Visit: Payer: Self-pay | Admitting: Oncology

## 2010-07-22 DIAGNOSIS — Z7901 Long term (current) use of anticoagulants: Secondary | ICD-10-CM

## 2010-07-22 DIAGNOSIS — C19 Malignant neoplasm of rectosigmoid junction: Secondary | ICD-10-CM

## 2010-07-22 DIAGNOSIS — Z5111 Encounter for antineoplastic chemotherapy: Secondary | ICD-10-CM

## 2010-07-22 LAB — CBC WITH DIFFERENTIAL/PLATELET
BASO%: 0.9 % (ref 0.0–2.0)
EOS%: 3.9 % (ref 0.0–7.0)
HCT: 31.7 % — ABNORMAL LOW (ref 38.4–49.9)
HGB: 10.4 g/dL — ABNORMAL LOW (ref 13.0–17.1)
MCH: 32.2 pg (ref 27.2–33.4)
MCHC: 32.8 g/dL (ref 32.0–36.0)
MONO#: 0.4 10*3/uL (ref 0.1–0.9)
NEUT%: 71.2 % (ref 39.0–75.0)
RDW: 17.2 % — ABNORMAL HIGH (ref 11.0–14.6)
WBC: 3.3 10*3/uL — ABNORMAL LOW (ref 4.0–10.3)
lymph#: 0.4 10*3/uL — ABNORMAL LOW (ref 0.9–3.3)

## 2010-07-24 ENCOUNTER — Encounter (HOSPITAL_BASED_OUTPATIENT_CLINIC_OR_DEPARTMENT_OTHER): Payer: Medicare HMO | Admitting: Oncology

## 2010-07-24 DIAGNOSIS — C19 Malignant neoplasm of rectosigmoid junction: Secondary | ICD-10-CM

## 2010-07-31 NOTE — Letter (Signed)
Summary: [facility]  [facility]   Imported By: Sherian Rein 07/21/2010 12:58:57  _____________________________________________________________________  External Attachment:    Type:   Image     Comment:   External Document

## 2010-08-02 ENCOUNTER — Observation Stay (HOSPITAL_COMMUNITY)
Admission: EM | Admit: 2010-08-02 | Discharge: 2010-08-04 | Disposition: A | Discharge status: Home / Self Care | Payer: Medicare HMO | Attending: Internal Medicine | Admitting: Internal Medicine

## 2010-08-02 DIAGNOSIS — R609 Edema, unspecified: Secondary | ICD-10-CM | POA: Insufficient documentation

## 2010-08-02 DIAGNOSIS — Z79899 Other long term (current) drug therapy: Secondary | ICD-10-CM | POA: Insufficient documentation

## 2010-08-02 DIAGNOSIS — I499 Cardiac arrhythmia, unspecified: Secondary | ICD-10-CM | POA: Insufficient documentation

## 2010-08-02 DIAGNOSIS — I4891 Unspecified atrial fibrillation: Secondary | ICD-10-CM | POA: Insufficient documentation

## 2010-08-02 DIAGNOSIS — G4730 Sleep apnea, unspecified: Secondary | ICD-10-CM | POA: Insufficient documentation

## 2010-08-02 DIAGNOSIS — S81009A Unspecified open wound, unspecified knee, initial encounter: Secondary | ICD-10-CM | POA: Insufficient documentation

## 2010-08-02 DIAGNOSIS — D649 Anemia, unspecified: Secondary | ICD-10-CM | POA: Insufficient documentation

## 2010-08-02 DIAGNOSIS — M25469 Effusion, unspecified knee: Secondary | ICD-10-CM | POA: Insufficient documentation

## 2010-08-02 DIAGNOSIS — W298XXA Contact with other powered powered hand tools and household machinery, initial encounter: Secondary | ICD-10-CM | POA: Insufficient documentation

## 2010-08-02 DIAGNOSIS — R5381 Other malaise: Secondary | ICD-10-CM | POA: Insufficient documentation

## 2010-08-02 DIAGNOSIS — R5383 Other fatigue: Secondary | ICD-10-CM | POA: Insufficient documentation

## 2010-08-02 DIAGNOSIS — I9589 Other hypotension: Principal | ICD-10-CM | POA: Insufficient documentation

## 2010-08-02 DIAGNOSIS — J449 Chronic obstructive pulmonary disease, unspecified: Secondary | ICD-10-CM | POA: Insufficient documentation

## 2010-08-02 DIAGNOSIS — D696 Thrombocytopenia, unspecified: Secondary | ICD-10-CM | POA: Insufficient documentation

## 2010-08-02 DIAGNOSIS — E785 Hyperlipidemia, unspecified: Secondary | ICD-10-CM | POA: Insufficient documentation

## 2010-08-02 DIAGNOSIS — C189 Malignant neoplasm of colon, unspecified: Secondary | ICD-10-CM | POA: Insufficient documentation

## 2010-08-02 DIAGNOSIS — Z7901 Long term (current) use of anticoagulants: Secondary | ICD-10-CM | POA: Insufficient documentation

## 2010-08-02 DIAGNOSIS — M25069 Hemarthrosis, unspecified knee: Secondary | ICD-10-CM | POA: Insufficient documentation

## 2010-08-02 DIAGNOSIS — I868 Varicose veins of other specified sites: Secondary | ICD-10-CM | POA: Insufficient documentation

## 2010-08-02 DIAGNOSIS — J4489 Other specified chronic obstructive pulmonary disease: Secondary | ICD-10-CM | POA: Insufficient documentation

## 2010-08-02 DIAGNOSIS — I10 Essential (primary) hypertension: Secondary | ICD-10-CM | POA: Insufficient documentation

## 2010-08-02 LAB — CBC
HCT: 25.9 % — ABNORMAL LOW (ref 39.0–52.0)
Hemoglobin: 8.5 g/dL — ABNORMAL LOW (ref 13.0–17.0)
MCV: 100 fL (ref 78.0–100.0)
RBC: 2.59 MIL/uL — ABNORMAL LOW (ref 4.22–5.81)
WBC: 4.1 10*3/uL (ref 4.0–10.5)

## 2010-08-02 LAB — BASIC METABOLIC PANEL
BUN: 24 mg/dL — ABNORMAL HIGH (ref 6–23)
CO2: 28 mEq/L (ref 19–32)
Chloride: 102 mEq/L (ref 96–112)
GFR calc non Af Amer: 49 mL/min — ABNORMAL LOW (ref >60)
Glucose, Bld: 127 mg/dL — ABNORMAL HIGH (ref 70–99)
Potassium: 3.7 mEq/L (ref 3.5–5.1)
Sodium: 135 mEq/L (ref 135–145)

## 2010-08-02 LAB — DIGOXIN LEVEL: Digoxin Level: 0.4 ng/mL — ABNORMAL LOW (ref 0.8–2.0)

## 2010-08-02 LAB — DIFFERENTIAL
Basophils Absolute: 0 10*3/uL (ref 0.0–0.1)
Eosinophils Relative: 3 % (ref 0–5)
Lymphocytes Relative: 9 % — ABNORMAL LOW (ref 12–46)
Lymphs Abs: 0.4 10*3/uL — ABNORMAL LOW (ref 0.7–4.0)
Neutro Abs: 2.8 10*3/uL (ref 1.7–7.7)

## 2010-08-02 LAB — OCCULT BLOOD, POC DEVICE: Fecal Occult Bld: NEGATIVE

## 2010-08-02 LAB — URINALYSIS, ROUTINE W REFLEX MICROSCOPIC
Hgb urine dipstick: NEGATIVE
Nitrite: NEGATIVE
Protein, ur: NEGATIVE mg/dL
Specific Gravity, Urine: 1.022 (ref 1.005–1.030)
Urobilinogen, UA: 1 mg/dL (ref 0.0–1.0)

## 2010-08-02 LAB — TROPONIN I: Troponin I: 0.02 ng/mL (ref 0.00–0.06)

## 2010-08-02 LAB — CK TOTAL AND CKMB (NOT AT ARMC): CK, MB: 0.9 ng/mL (ref 0.3–4.0)

## 2010-08-03 ENCOUNTER — Observation Stay (HOSPITAL_COMMUNITY): Payer: Medicare HMO

## 2010-08-03 LAB — COMPREHENSIVE METABOLIC PANEL
Albumin: 2.3 g/dL — ABNORMAL LOW (ref 3.5–5.2)
BUN: 19 mg/dL (ref 6–23)
Calcium: 7.9 mg/dL — ABNORMAL LOW (ref 8.4–10.5)
Glucose, Bld: 102 mg/dL — ABNORMAL HIGH (ref 70–99)
Sodium: 138 mEq/L (ref 135–145)
Total Protein: 5 g/dL — ABNORMAL LOW (ref 6.0–8.3)

## 2010-08-03 LAB — CBC
HCT: 23 % — ABNORMAL LOW (ref 39.0–52.0)
MCHC: 32.6 g/dL (ref 30.0–36.0)
MCV: 101.3 fL — ABNORMAL HIGH (ref 78.0–100.0)
Platelets: 95 10*3/uL — ABNORMAL LOW (ref 150–400)
RDW: 16.7 % — ABNORMAL HIGH (ref 11.5–15.5)

## 2010-08-03 LAB — CORTISOL-AM, BLOOD: Cortisol - AM: 3.2 ug/dL — ABNORMAL LOW (ref 4.3–22.4)

## 2010-08-03 MED ORDER — IOHEXOL 300 MG/ML  SOLN
125.0000 mL | Freq: Once | INTRAMUSCULAR | Status: AC | PRN
  Administered 2010-08-03: 125 mL via INTRAVENOUS

## 2010-08-04 LAB — CBC
HCT: 26.6 % — ABNORMAL LOW (ref 39.0–52.0)
HCT: 27.1 % — ABNORMAL LOW (ref 39.0–52.0)
Hemoglobin: 8.9 g/dL — ABNORMAL LOW (ref 13.0–17.0)
MCH: 31.9 pg (ref 26.0–34.0)
MCH: 31.9 pg (ref 26.0–34.0)
MCHC: 32.8 g/dL (ref 30.0–36.0)
MCHC: 33.1 g/dL (ref 30.0–36.0)
MCV: 96.4 fL (ref 78.0–100.0)
MCV: 97.1 fL (ref 78.0–100.0)
RBC: 2.79 MIL/uL — ABNORMAL LOW (ref 4.22–5.81)
RDW: 17.8 % — ABNORMAL HIGH (ref 11.5–15.5)

## 2010-08-04 LAB — BASIC METABOLIC PANEL
BUN: 13 mg/dL (ref 6–23)
CO2: 27 mEq/L (ref 19–32)
Calcium: 8.4 mg/dL (ref 8.4–10.5)
Chloride: 110 mEq/L (ref 96–112)
Creatinine, Ser: 1.27 mg/dL (ref 0.4–1.5)
Glucose, Bld: 123 mg/dL — ABNORMAL HIGH (ref 70–99)

## 2010-08-04 LAB — CROSSMATCH
ABO/RH(D): O POS
Unit division: 0

## 2010-08-04 LAB — APTT: aPTT: 58 seconds — ABNORMAL HIGH (ref 24–37)

## 2010-08-04 LAB — PROTIME-INR: INR: 2.41 — ABNORMAL HIGH (ref 0.00–1.49)

## 2010-08-04 NOTE — H&P (Signed)
[name], [name] NO.:  [id number]  MEDICAL RECORD NO.:  [id number]           PATIENT TYPE:  E  LOCATION:  WLED                         FACILITY:  [facility]  PHYSICIAN:  Mariea Stable, MD   DATE OF BIRTH:  [birth year]  DATE OF ADMISSION:  08/02/2010 DATE OF DISCHARGE:                             HISTORY & PHYSICAL   PRIMARY CARE PHYSICIAN:  Tracey Harries, M.D.  CHIEF COMPLAINT:  Feeling lousy.  HISTORY OF PRESENT ILLNESS:  Mr. Vincent [name] is a pleasant 77 year old gentleman with past medical history significant for locally advanced rectal cancer, status post sigmoid colectomy, Hartmann pouch procedure, now undergoing chemotherapy who presents with chief complaint as mentioned above.  The patient reports that he was seen in his primary care physician's office yesterday and was told that he should go get further evaluation because his blood pressure was low.  The patient decided not to and states that today he woke up feeling lousy.  This was further elucidated to be just a generalized weak feeling, easily fatigued, and actually fell asleep on the table this morning while reading the paper.  He denies any other symptoms associated with this. He does report that this morning he checked his blood pressure and noticed it was 74/35, which prompted the visit to the emergency department.  Of Wednesday, 3 days prior to admission, the patient cut his leg with a chain saw and required approximately 20 or 21 stitches.  He is doing fine from that standpoint using Vicodin p.r.n. pain.  Upon evaluation in the emergency department, he was noted to have a blood pressure of 87/48 with a heart rate of 71 that responded to fluids and came up to 117/39, but was noted to continue to have some orthostasis with lying blood pressure of 103/76 trending down to 89/42 and pulse that increased from 65 to 81 from lying to standing.  At that point, at [facility] was called to  admit for observation and ensure that orthostasis resolve.  Furthermore, he was noted to have a hemoglobin of 8.9, which appears to be somewhat close to his baseline when older records reviewed.  PAST MEDICAL HISTORY: 1. Locally advanced rectal cancer, status post sigmoid colectomy,     Hartmann pouch procedure, and currently on chemotherapy every other     Tuesday. 2. Paroxysmal atrial fibrillation. 3. Anemia. 4. COPD. 5. Obstructive sleep apnea on CPAP. 6. Hypertension. 7. Dyslipidemia.  MEDICATIONS: 1. Vicodin 7.5/325 mg p.o. p.r.n. pain. 2. Carvedilol 6.25 mg p.o. b.i.d. 3. Keflex 500 mg p.o. b.i.d. 4. Ramipril 2.5 mg p.o. daily. 5. Aspirin 81 mg p.o. daily. 6. Digoxin 0.125 mg p.o. daily. 7. Vitamin D3 8. Advair 1 puff b.i.d. 9. Combivent inhaled 4 times daily. 10.Simvastatin 40 mg p.o. daily. 11.Omega-3 daily.  ALLERGIES:  No known drug allergies.  SOCIAL HISTORY:  The patient is married.  He is a prior smoker, but quit about 4 years ago.  He denies virtually any alcohol use, reports maybe to beers a year and denies any drug use.  He is married, wife's phone number is [phone].  He has 2  children.  FAMILY HISTORY:  Noncontributory, father died of an unknown cancer.  REVIEW OF SYSTEMS:  As per HPI, all others reviewed and negative.  PHYSICAL EXAMINATION:  GENERAL:  This is an older gentleman who is very pleasant, lying in bed, in no acute distress. VITAL SIGNS:  Most recent vitals, temperature 97.6, blood pressure 100/42, pulse of 80, respiratory rate of 16.  O2 saturation 98% on room air. HEENT:  Normocephalic, atraumatic.  Pupils equally round and reactive to light.  Extraocular movements are intact.  Sclerae anicteric.  Mucous membranes are moist.  There are no oropharyngeal lesions. NECK:  Supple.  There is no JVD.  There are no carotid bruits. HEART:  Distant heart sounds with S1 and S2.  Regular rate and rhythm and no appreciable murmurs, gallops, or  rubs. LUNGS:  Rather distant breath sounds.  There are end expiratory wheezes noted. ABDOMEN:  Normal active bowel sounds.  Nontender and nondistended.  No guarding. EXTREMITIES:  There is no cyanosis, clubbing, or edema.  His right knee is bandaged and painful. NEUROLOGIC:  The patient is awake, alert, and oriented x3.  Cranial nerves II through XII are intact.  Motor is intact.  Sensation is grossly intact.  LABORATORY DATA:  WBC 4.1, hemoglobin 8.5, platelets 114.  Sodium 135, potassium 3.7, chloride 102, bicarbonate of 28, glucose 127, BUN 24, creatinine 1.4, calcium 8.4, CK of 33, CK-MB 0.9, troponin 0.02. Digoxin 0.4.  Urinalysis is negative and point-of-care fecal occult blood test is negative.  ASSESSMENT AND PLAN: 1. Hypotension, currently of unclear etiology, but presume that this     is secondary to possible decreased oral intake with the patient's     recent diagnosis of cancer and ongoing chemotherapy.  He also had     the accident where he sustained a cut with a chain saw to his right     lower extremity that required fishing.  His hemoglobin appears to     be stable compared to prior values, although the most recent CBC in     February had a documented hemoglobin of 11.9, which is     significantly higher than previous ones and unclear if this was a     spurious result or if he had actually had an increase in  his     hemoglobin to 11.9 and has had some acute blood loss anemia on top     of chronic component.  At this point, we will go ahead and hold     antihypertensive medications and hydrate gently given his ongoing     orthostatic vital signs.  The patient currently does not have any     symptoms to suggest infection and is afebrile and without a     leukocytosis.  If the patient's blood pressure responds to IV     fluids and hemoglobin remains stable, we will consider discharge     tomorrow. 2. Anemia, as mentioned above, it is unclear if this is his chronic      baseline or if there was an acute-on-chronic drop compared to the     value of February 1st.  I will go ahead and repeat hemoglobin in     the morning and check an anemia panel to further elucidate the     etiology of his anemia.  Of note, he has had surgery, which     probably contributed to some blood loss anemia and is currently on     chemotherapy, which may  be contributing. 3. Thrombocytopenia likely secondary to chemotherapy. 4. Rectal cancer, status post resection and currently on chemotherapy.     The patient is getting chemotherapy every other Tuesday and is due     for a another cycle this upcoming Tuesday.  We will hope that the     patient will be discharged prior to that. 5. Coronary artery disease.  We will continue the patient's aspirin 81     mg along with statin.  We will go ahead and temporarily hold his     carvedilol as well as his ramipril given hypotension.  We should be     continued once the patient's blood pressure stable.  Of note, the     patient is also on digoxin and his dig level was 0.4. 6. Paroxysmal atrial fibrillation.  The patient appears to be in     normal sinus rhythm at this point.  We will just go ahead and     monitor vitals. 7. Obstructive sleep apnea.  We will go ahead and continue with CPAP     q.h.s.  CODE STATUS:  The patient is a full code and wife's name is Ms. Kathie Rhodes, phone number is [phone].     Mariea Stable, MD     MA/MEDQ  D:  08/02/2010  T:  08/02/2010  Job:  [id number]  Electronically Signed by Mariea Stable MD on 08/04/2010 03:25:50 PM

## 2010-08-05 ENCOUNTER — Other Ambulatory Visit: Payer: Self-pay | Admitting: Oncology

## 2010-08-05 ENCOUNTER — Encounter (HOSPITAL_BASED_OUTPATIENT_CLINIC_OR_DEPARTMENT_OTHER): Payer: Medicare HMO | Admitting: Oncology

## 2010-08-05 DIAGNOSIS — Z7901 Long term (current) use of anticoagulants: Secondary | ICD-10-CM

## 2010-08-05 DIAGNOSIS — C19 Malignant neoplasm of rectosigmoid junction: Secondary | ICD-10-CM

## 2010-08-05 LAB — CBC WITH DIFFERENTIAL/PLATELET
BASO%: 0.6 % (ref 0.0–2.0)
Basophils Absolute: 0 10*3/uL (ref 0.0–0.1)
EOS%: 3 % (ref 0.0–7.0)
HCT: 34.2 % — ABNORMAL LOW (ref 38.4–49.9)
HGB: 11.5 g/dL — ABNORMAL LOW (ref 13.0–17.1)
MCH: 31.9 pg (ref 27.2–33.4)
MONO#: 0.8 10*3/uL (ref 0.1–0.9)
NEUT%: 75.1 % — ABNORMAL HIGH (ref 39.0–75.0)
RDW: 17.7 % — ABNORMAL HIGH (ref 11.0–14.6)
WBC: 5 10*3/uL (ref 4.0–10.3)
lymph#: 0.3 10*3/uL — ABNORMAL LOW (ref 0.9–3.3)

## 2010-08-14 NOTE — Discharge Summary (Signed)
NAMEORVA, Vincent [name] NO.:  [id number]  MEDICAL RECORD NO.:  [id number]           PATIENT TYPE:  O  LOCATION:  1341                         FACILITY:  [facility]  PHYSICIAN:  Osvaldo Shipper, MD     DATE OF BIRTH:  February 13, 1933  DATE OF ADMISSION:  08/02/2010 DATE OF DISCHARGE:                              DISCHARGE SUMMARY   PRIMARY CARE PHYSICIAN:  Tracey Harries, M.D.  CONSULTATION DURING THIS ADMISSION:  I discussed the patient's CT findings with Dr. Lajoyce Corners over the phone.  IMAGING STUDIES:  Imaging studies done include the following, CT of the right knee which showed a moderate-sized hemarthrosis.  Small-to- moderate size hematoma within and predominately superficial to the distal vastus medialis muscle and the adjacent portion of the medial patellar retinaculum.  Some spurring was noted, diffuse subcutaneous edema around the knee was seen.  Venous varicosities were noted. Degenerative articular space narrowing in the medial compartment of the knee was also seen.  LABORATORY DATA:  Pertinent labs include a hemoglobin initially of 8.5 which dropped to 7.5.  He was transfused 2 units and it is 8.9 this morning, platelet count is 103,000 this morning.  INR was 4.9 yesterday. He was given vitamin K 2.5 mg p.o. and then his INR is 2.41 this morning.  His BUN was 24, creatinine 1.4 yesterday and is normal today. Troponin was normal.  Cortisol level was 3.2 yesterday morning.  Digoxin level 0.4.  Occult blood testing was negative in the feces.  DISCHARGE DIAGNOSES: 1. Hypotension probably from acute blood loss, stable. 2. Anemia secondary to acute blood loss, status post transfusion. 3. Chain saw injury to the right knee with hemarthrosis, requiring no     intervention. 4. History of atrial fibrillation, on Coumadin. 5. History of colon cancer, currently on chemotherapy.  BRIEF HOSPITAL COURSE: 1. Hypotension.  This is a 77 year old Caucasian male who presented to     the hospital for hypotension and weakness.  He was sent over from     his doctor's office.  Blood pressure in the 70s in the ED, was     given fluids and it came up temporarily but had to be admitted for     further workup and evaluation.  When I saw him yesterday morning,     his right knee was significantly swollen.  When I asked him about     this, he told me that he had an incident with a chain saw a few     days ago and as a result required stitches and whole knee was     dressed.  I removed the dressing, the knee was significantly     enlarged compared to the left.  This prompted a CT which has been     discussed above.  Because of the CT findings, I called Dr. Lajoyce Corners who     was the orthopedic on-call yesterday, discussed the CT findings     with him.  He said there was no role for intervention at this time     since this was 3 days post event.  He  recommended keeping the leg     elevated.  He mentioned that antibiotics may be helpful and the     patient is already on Keflex.  I have provided the patient with Dr.     Audrie Lia phone number in case his knee starts getting worse.  He is     able to ambulate on the knee at this time. 2. Anemia, likely secondary to acute blood loss.  His hemoglobin when     he came in appeared to be close to his baseline but then it dropped     to 7.5 yesterday morning.  Because of the significant hemarthrosis     and hematoma, he was transfused 2 units of blood.  His hemoglobin     came up to 8.8 and 8.9 this morning. 3. Supratherapeutic INR.  The patient has a history of AFIB and is on     Coumadin.  When he saw his doctor on Thursday, his INR was     elevated.  He was asked to stop Coumadin.  We rechecked the INR     yesterday, was still at 4.9, which is extremely surprising but     probably related to antibiotic use.  We gave him a dose of vitamin     K.  His INR is down to 2.4 this morning.  Dr. Lajoyce Corners mentioned that     it was okay to resume  Coumadin.  However, I will defer this issue     to the patient's PCP whom he is seeing this afternoon. 4. History of colon cancer.  He is status post resection last year and     is currently on chemotherapeutic regimen.  He has this usual     chemotherapy tomorrow that is Tuesday.  I will let Dr. Clelia Croft know     of this patient. 5. He has a history of hyperlipidemia, sleep apnea, all of which are     quite stable. 6. He had a cortisol level of 3.2 which was ordered because of     hypotension.  However, we do have other reasons for hypotension.  I     am not quite sure why the cortisol level is low at this point.  I     do not plan to do any other inpatient workup.  PCP to please     followup on this. 7. Chronic thrombocytopenia which is stable. 8. On the day of discharge the patient is feeling well.  He is keen on     going home.  Denies any new complaints.  Denied any shortness of     breath.  PHYSICAL EXAMINATION ON DISCHARGE:  VITAL SIGNS:  His vital signs revealed that his temperature is 99.2.  His pulse rate is 81.  His respiratory rate is 18, blood pressure 133/66, saturation 94% on room air. LUNGS:  Clear to auscultation bilaterally with diminished air entry at the bases.  No wheezing is present.  CARDIOVASCULAR:  S1 and S2 is irregular.  No S3, S4 rubs, murmurs or bruit. ABDOMEN:  Soft, nontender, nondistended.  Bowel sounds are present.  No masses or organomegaly appreciated. GU: Deferred. MUSCULOSKELETAL:  His right leg is swollen with good peripheral pulses. NEUROLOGIC:  He is alert, oriented x 3.  No focal neurological deficits are present.  LABORATORY DATA:  Discussed above.  We did order a venous Doppler yesterday initially but considering his therapeutic INR and the abnormal CT findings, we decided that this was not likely  repeat, so we cancelled the Doppler study.  DISCHARGE MEDICATIONS: 1. Aspirin 81 mg p.o. daily, resume only after discussing with PMD. 2.  Coumadin, resume only after discussing with PMD. 3. Advair Diskus 250/50 every 12 hours. 4. Combivent 2 puffs 4 times daily as needed. 5. Coreg 6.25 mg twice daily with meals. 6. Digoxin 0.125 mg daily. 7. Hydrocodone/acetaminophen 7.5/325 every 4 hours as needed for pain. 8. Keflex 500 mg p.o. b.i.d. as before. 9. Omega-3 twice daily. 10.Ramipril 2.5 mg once daily. 11.Simvastatin 40 mg once daily. 12.Vitamin D 50,000 units weekly.  FOLLOWUP: 1. Follow up with Dr. Everlene Other, the patient has an appointment at 1     o'clock this afternoon. 2. With Dr. Clelia Croft for chemo tomorrow. 3. We have given him phone number for Dr. Audrie Lia home.  He may call if     the patient has trouble with his right knee.  DIET:  Heart-healthy.  PHYSICAL ACTIVITY:  Increase activity slowly.  No exertion.  No heavy lifting.  Total time on this discharge encounter is 35 minutes.   Osvaldo Shipper, MD     GK/MEDQ  D:  08/04/2010  T:  08/04/2010  Job:  [id number]  cc:   Blenda Nicely. Clelia Croft, M.D. Fax: [phone]  Tracey Harries, M.D. Fax: [phone]  Nadara Mustard, MD Fax: [phone]  Electronically Signed by Osvaldo Shipper MD on 08/14/2010 11:23:33 PM

## 2010-08-19 ENCOUNTER — Other Ambulatory Visit: Payer: Self-pay | Admitting: Oncology

## 2010-08-19 ENCOUNTER — Encounter (HOSPITAL_BASED_OUTPATIENT_CLINIC_OR_DEPARTMENT_OTHER): Payer: Medicare HMO | Admitting: Oncology

## 2010-08-19 DIAGNOSIS — Z5111 Encounter for antineoplastic chemotherapy: Secondary | ICD-10-CM

## 2010-08-19 DIAGNOSIS — I4891 Unspecified atrial fibrillation: Secondary | ICD-10-CM

## 2010-08-19 DIAGNOSIS — C19 Malignant neoplasm of rectosigmoid junction: Secondary | ICD-10-CM

## 2010-08-19 DIAGNOSIS — Z7901 Long term (current) use of anticoagulants: Secondary | ICD-10-CM

## 2010-08-19 LAB — COMPREHENSIVE METABOLIC PANEL
ALT: 12 U/L (ref 0–53)
Albumin: 3.6 g/dL (ref 3.5–5.2)
CO2: 24 mEq/L (ref 19–32)
Calcium: 9 mg/dL (ref 8.4–10.5)
Chloride: 105 mEq/L (ref 96–112)
Glucose, Bld: 95 mg/dL (ref 70–99)
Sodium: 137 mEq/L (ref 135–145)
Total Protein: 5.9 g/dL — ABNORMAL LOW (ref 6.0–8.3)

## 2010-08-19 LAB — CBC WITH DIFFERENTIAL/PLATELET
Basophils Absolute: 0.1 10*3/uL (ref 0.0–0.1)
Eosinophils Absolute: 0.2 10*3/uL (ref 0.0–0.5)
HGB: 10.6 g/dL — ABNORMAL LOW (ref 13.0–17.1)
LYMPH%: 11.8 % — ABNORMAL LOW (ref 14.0–49.0)
MCV: 98.5 fL — ABNORMAL HIGH (ref 79.3–98.0)
MONO#: 0.4 10*3/uL (ref 0.1–0.9)
NEUT#: 3 10*3/uL (ref 1.5–6.5)
Platelets: 159 10*3/uL (ref 140–400)
RBC: 3.28 10*6/uL — ABNORMAL LOW (ref 4.20–5.82)
RDW: 16.9 % — ABNORMAL HIGH (ref 11.0–14.6)
WBC: 4.2 10*3/uL (ref 4.0–10.3)
nRBC: 0 % (ref 0–0)

## 2010-08-21 ENCOUNTER — Encounter (HOSPITAL_BASED_OUTPATIENT_CLINIC_OR_DEPARTMENT_OTHER): Payer: Medicare HMO | Admitting: Oncology

## 2010-08-21 DIAGNOSIS — C19 Malignant neoplasm of rectosigmoid junction: Secondary | ICD-10-CM

## 2010-09-01 ENCOUNTER — Other Ambulatory Visit: Payer: Self-pay | Admitting: Oncology

## 2010-09-01 ENCOUNTER — Encounter (HOSPITAL_BASED_OUTPATIENT_CLINIC_OR_DEPARTMENT_OTHER): Payer: Medicare HMO | Admitting: Oncology

## 2010-09-01 DIAGNOSIS — Z7901 Long term (current) use of anticoagulants: Secondary | ICD-10-CM

## 2010-09-01 DIAGNOSIS — Z5111 Encounter for antineoplastic chemotherapy: Secondary | ICD-10-CM

## 2010-09-01 DIAGNOSIS — C19 Malignant neoplasm of rectosigmoid junction: Secondary | ICD-10-CM

## 2010-09-01 LAB — COMPREHENSIVE METABOLIC PANEL
ALT: 13 U/L (ref 0–53)
AST: 16 U/L (ref 0–37)
Calcium: 9.3 mg/dL (ref 8.4–10.5)
Chloride: 108 mEq/L (ref 96–112)
Creatinine, Ser: 1.2 mg/dL (ref 0.40–1.50)
Sodium: 143 mEq/L (ref 135–145)
Total Protein: 5.9 g/dL — ABNORMAL LOW (ref 6.0–8.3)

## 2010-09-01 LAB — CBC WITH DIFFERENTIAL/PLATELET
BASO%: 1.3 % (ref 0.0–2.0)
LYMPH%: 14.4 % (ref 14.0–49.0)
MCHC: 33.3 g/dL (ref 32.0–36.0)
MONO#: 0.5 10*3/uL (ref 0.1–0.9)
NEUT#: 2.7 10*3/uL (ref 1.5–6.5)
RBC: 3.34 10*6/uL — ABNORMAL LOW (ref 4.20–5.82)
RDW: 17.1 % — ABNORMAL HIGH (ref 11.0–14.6)
WBC: 3.9 10*3/uL — ABNORMAL LOW (ref 4.0–10.3)
lymph#: 0.6 10*3/uL — ABNORMAL LOW (ref 0.9–3.3)
nRBC: 0 % (ref 0–0)

## 2010-09-02 ENCOUNTER — Encounter (HOSPITAL_BASED_OUTPATIENT_CLINIC_OR_DEPARTMENT_OTHER): Payer: Medicare HMO | Admitting: Oncology

## 2010-09-02 DIAGNOSIS — Z5111 Encounter for antineoplastic chemotherapy: Secondary | ICD-10-CM

## 2010-09-02 DIAGNOSIS — C19 Malignant neoplasm of rectosigmoid junction: Secondary | ICD-10-CM

## 2010-09-03 ENCOUNTER — Encounter (INDEPENDENT_AMBULATORY_CARE_PROVIDER_SITE_OTHER): Payer: Self-pay | Admitting: General Surgery

## 2010-09-04 ENCOUNTER — Encounter (HOSPITAL_BASED_OUTPATIENT_CLINIC_OR_DEPARTMENT_OTHER): Payer: Medicare HMO | Admitting: Oncology

## 2010-09-04 DIAGNOSIS — Z452 Encounter for adjustment and management of vascular access device: Secondary | ICD-10-CM

## 2010-09-04 DIAGNOSIS — C19 Malignant neoplasm of rectosigmoid junction: Secondary | ICD-10-CM

## 2010-09-16 ENCOUNTER — Other Ambulatory Visit: Payer: Self-pay | Admitting: Oncology

## 2010-09-16 ENCOUNTER — Encounter (HOSPITAL_BASED_OUTPATIENT_CLINIC_OR_DEPARTMENT_OTHER): Payer: Medicare HMO | Admitting: Oncology

## 2010-09-16 DIAGNOSIS — Z5111 Encounter for antineoplastic chemotherapy: Secondary | ICD-10-CM

## 2010-09-16 DIAGNOSIS — C189 Malignant neoplasm of colon, unspecified: Secondary | ICD-10-CM

## 2010-09-16 DIAGNOSIS — I4891 Unspecified atrial fibrillation: Secondary | ICD-10-CM

## 2010-09-16 DIAGNOSIS — Z7901 Long term (current) use of anticoagulants: Secondary | ICD-10-CM

## 2010-09-16 DIAGNOSIS — D696 Thrombocytopenia, unspecified: Secondary | ICD-10-CM

## 2010-09-16 DIAGNOSIS — C19 Malignant neoplasm of rectosigmoid junction: Secondary | ICD-10-CM

## 2010-09-16 LAB — CBC WITH DIFFERENTIAL/PLATELET
BASO%: 1.1 % (ref 0.0–2.0)
EOS%: 2.4 % (ref 0.0–7.0)
HCT: 33.9 % — ABNORMAL LOW (ref 38.4–49.9)
LYMPH%: 10.7 % — ABNORMAL LOW (ref 14.0–49.0)
MCH: 32.9 pg (ref 27.2–33.4)
MCHC: 33.6 g/dL (ref 32.0–36.0)
NEUT%: 71.6 % (ref 39.0–75.0)
Platelets: 94 10*3/uL — ABNORMAL LOW (ref 140–400)
lymph#: 0.4 10*3/uL — ABNORMAL LOW (ref 0.9–3.3)

## 2010-09-16 LAB — COMPREHENSIVE METABOLIC PANEL
AST: 17 U/L (ref 0–37)
Alkaline Phosphatase: 55 U/L (ref 39–117)
BUN: 17 mg/dL (ref 6–23)
Creatinine, Ser: 1.16 mg/dL (ref 0.40–1.50)

## 2010-09-18 ENCOUNTER — Encounter (HOSPITAL_BASED_OUTPATIENT_CLINIC_OR_DEPARTMENT_OTHER): Payer: Medicare HMO | Admitting: Oncology

## 2010-09-18 DIAGNOSIS — C19 Malignant neoplasm of rectosigmoid junction: Secondary | ICD-10-CM

## 2010-09-18 DIAGNOSIS — Z452 Encounter for adjustment and management of vascular access device: Secondary | ICD-10-CM

## 2010-10-01 ENCOUNTER — Other Ambulatory Visit: Payer: Self-pay | Admitting: Oncology

## 2010-10-01 ENCOUNTER — Encounter (HOSPITAL_BASED_OUTPATIENT_CLINIC_OR_DEPARTMENT_OTHER): Payer: Medicare HMO | Admitting: Oncology

## 2010-10-01 DIAGNOSIS — I4891 Unspecified atrial fibrillation: Secondary | ICD-10-CM

## 2010-10-01 DIAGNOSIS — Z7901 Long term (current) use of anticoagulants: Secondary | ICD-10-CM

## 2010-10-01 DIAGNOSIS — Z5111 Encounter for antineoplastic chemotherapy: Secondary | ICD-10-CM

## 2010-10-01 DIAGNOSIS — C19 Malignant neoplasm of rectosigmoid junction: Secondary | ICD-10-CM

## 2010-10-01 DIAGNOSIS — D696 Thrombocytopenia, unspecified: Secondary | ICD-10-CM

## 2010-10-01 LAB — CBC WITH DIFFERENTIAL/PLATELET
Basophils Absolute: 0.1 10*3/uL (ref 0.0–0.1)
Eosinophils Absolute: 0.1 10*3/uL (ref 0.0–0.5)
HGB: 10.7 g/dL — ABNORMAL LOW (ref 13.0–17.1)
LYMPH%: 15.5 % (ref 14.0–49.0)
MCV: 98.2 fL — ABNORMAL HIGH (ref 79.3–98.0)
MONO%: 16.1 % — ABNORMAL HIGH (ref 0.0–14.0)
NEUT#: 2.1 10*3/uL (ref 1.5–6.5)
Platelets: 106 10*3/uL — ABNORMAL LOW (ref 140–400)

## 2010-10-01 LAB — COMPREHENSIVE METABOLIC PANEL
CO2: 22 mEq/L (ref 19–32)
Creatinine, Ser: 1.27 mg/dL (ref 0.40–1.50)
Glucose, Bld: 197 mg/dL — ABNORMAL HIGH (ref 70–99)
Sodium: 139 mEq/L (ref 135–145)
Total Bilirubin: 0.5 mg/dL (ref 0.3–1.2)
Total Protein: 5.8 g/dL — ABNORMAL LOW (ref 6.0–8.3)

## 2010-10-03 ENCOUNTER — Encounter (HOSPITAL_BASED_OUTPATIENT_CLINIC_OR_DEPARTMENT_OTHER): Payer: Medicare HMO | Admitting: Oncology

## 2010-10-03 DIAGNOSIS — Z452 Encounter for adjustment and management of vascular access device: Secondary | ICD-10-CM

## 2010-10-03 DIAGNOSIS — C19 Malignant neoplasm of rectosigmoid junction: Secondary | ICD-10-CM

## 2010-10-03 DIAGNOSIS — Z5111 Encounter for antineoplastic chemotherapy: Secondary | ICD-10-CM

## 2010-10-20 ENCOUNTER — Other Ambulatory Visit: Payer: Self-pay | Admitting: Oncology

## 2010-10-20 ENCOUNTER — Encounter (HOSPITAL_BASED_OUTPATIENT_CLINIC_OR_DEPARTMENT_OTHER): Payer: Medicare HMO | Admitting: Oncology

## 2010-10-20 ENCOUNTER — Ambulatory Visit (HOSPITAL_COMMUNITY)
Admission: RE | Admit: 2010-10-20 | Discharge: 2010-10-20 | Disposition: A | Discharge status: Home / Self Care | Payer: Medicare HMO | Source: Ambulatory Visit | Attending: Oncology | Admitting: Oncology

## 2010-10-20 ENCOUNTER — Encounter (HOSPITAL_COMMUNITY): Payer: Self-pay

## 2010-10-20 DIAGNOSIS — I714 Abdominal aortic aneurysm, without rupture, unspecified: Secondary | ICD-10-CM | POA: Insufficient documentation

## 2010-10-20 DIAGNOSIS — K7689 Other specified diseases of liver: Secondary | ICD-10-CM | POA: Insufficient documentation

## 2010-10-20 DIAGNOSIS — C19 Malignant neoplasm of rectosigmoid junction: Secondary | ICD-10-CM

## 2010-10-20 DIAGNOSIS — R5381 Other malaise: Secondary | ICD-10-CM | POA: Insufficient documentation

## 2010-10-20 DIAGNOSIS — C189 Malignant neoplasm of colon, unspecified: Secondary | ICD-10-CM | POA: Insufficient documentation

## 2010-10-20 DIAGNOSIS — D72819 Decreased white blood cell count, unspecified: Secondary | ICD-10-CM | POA: Insufficient documentation

## 2010-10-20 DIAGNOSIS — K5730 Diverticulosis of large intestine without perforation or abscess without bleeding: Secondary | ICD-10-CM | POA: Insufficient documentation

## 2010-10-20 LAB — CMP (CANCER CENTER ONLY)
ALT(SGPT): 21 U/L (ref 10–47)
Albumin: 3.1 g/dL — ABNORMAL LOW (ref 3.3–5.5)
CO2: 32 mEq/L (ref 18–33)
Calcium: 9.1 mg/dL (ref 8.0–10.3)
Chloride: 105 mEq/L (ref 98–108)
Potassium: 4.2 mEq/L (ref 3.3–4.7)
Sodium: 140 mEq/L (ref 128–145)
Total Protein: 6.1 g/dL — ABNORMAL LOW (ref 6.4–8.1)

## 2010-10-20 LAB — CBC WITH DIFFERENTIAL/PLATELET
Eosinophils Absolute: 0.1 10*3/uL (ref 0.0–0.5)
LYMPH%: 15.5 % (ref 14.0–49.0)
MONO#: 0.6 10*3/uL (ref 0.1–0.9)
NEUT#: 1.2 10*3/uL — ABNORMAL LOW (ref 1.5–6.5)
Platelets: 121 10*3/uL — ABNORMAL LOW (ref 140–400)
RBC: 3.25 10*6/uL — ABNORMAL LOW (ref 4.20–5.82)
RDW: 17.6 % — ABNORMAL HIGH (ref 11.0–14.6)
WBC: 2.2 10*3/uL — ABNORMAL LOW (ref 4.0–10.3)
lymph#: 0.3 10*3/uL — ABNORMAL LOW (ref 0.9–3.3)
nRBC: 0 % (ref 0–0)

## 2010-10-20 LAB — CEA: CEA: 2.5 ng/mL (ref 0.0–5.0)

## 2010-10-20 MED ORDER — IOHEXOL 300 MG/ML  SOLN
100.0000 mL | Freq: Once | INTRAMUSCULAR | Status: AC | PRN
  Administered 2010-10-20: 100 mL via INTRAVENOUS

## 2010-10-21 ENCOUNTER — Encounter (HOSPITAL_BASED_OUTPATIENT_CLINIC_OR_DEPARTMENT_OTHER): Payer: Medicare HMO | Admitting: Oncology

## 2010-10-21 DIAGNOSIS — I4891 Unspecified atrial fibrillation: Secondary | ICD-10-CM

## 2010-10-21 DIAGNOSIS — I10 Essential (primary) hypertension: Secondary | ICD-10-CM

## 2010-10-21 DIAGNOSIS — C19 Malignant neoplasm of rectosigmoid junction: Secondary | ICD-10-CM

## 2010-12-04 ENCOUNTER — Encounter (HOSPITAL_BASED_OUTPATIENT_CLINIC_OR_DEPARTMENT_OTHER): Payer: Medicare HMO | Admitting: Oncology

## 2010-12-04 DIAGNOSIS — C19 Malignant neoplasm of rectosigmoid junction: Secondary | ICD-10-CM

## 2010-12-04 DIAGNOSIS — Z452 Encounter for adjustment and management of vascular access device: Secondary | ICD-10-CM

## 2011-01-12 ENCOUNTER — Encounter (INDEPENDENT_AMBULATORY_CARE_PROVIDER_SITE_OTHER): Payer: Self-pay | Admitting: Surgery

## 2011-01-12 ENCOUNTER — Ambulatory Visit (INDEPENDENT_AMBULATORY_CARE_PROVIDER_SITE_OTHER): Payer: Medicare HMO | Admitting: Surgery

## 2011-01-12 VITALS — BP 132/66 | HR 62 | Temp 96.8°F | Wt 206.8 lb

## 2011-01-12 DIAGNOSIS — C20 Malignant neoplasm of rectum: Secondary | ICD-10-CM

## 2011-01-12 NOTE — Progress Notes (Signed)
Visit Diagnoses: 1. Rectal carcinoma     HISTORY: Patient is a 77 year old white male who underwent exploratory laparotomy and low anterior resection with incomplete resection of a large locally advanced rectal carcinoma. Patient did have positive lymph nodes. Postoperatively he received adjuvant radiation therapy and chemotherapy. Recently his CEA level remains normal at 2.5. A CT scan of the chest abdomen and pelvis from May 2012 shows no evidence of recurrent disease. He returns today for scheduled followup.   PERTINENT REVIEW OF SYSTEMS: Patient denies pain. He remains anemic but without evident blood loss. He does have some clear thin discharge from the rectum on occasion.  He has minor incontinence of rectal secretions.   EXAM: HEENT: normocephalic; pupils equal and reactive; sclerae clear; dentition good; mucous membranes moist NECK:  symmetric on extension; no palpable anterior or posterior cervical lymphadenopathy; no supraclavicular masses; no tenderness CHEST: clear to auscultation bilaterally without rales, rhonchi, or wheezes CARDIAC: regular rate and rhythm without significant murmur; peripheral pulses are full EXT:  non-tender without edema; no deformity NEURO: no gross focal deficits; no sign of tremor RECTAL: No palpable mass; minor discomfort; no sign of blood   IMPRESSION: #1 Personal history of rectal carcinoma with extensive tumor and lymph node metastasis, no evidence of persistent or recurrent disease on present staging. #2 probable mild rectal incontinence #3 chronic anemia   PLAN: The patient and I again discussed the possibility of reversal of his colostomy with complete resection of the upper rectum. Certainly this would be an extensive and challenging procedure for this debilitated patient. At this point the patient is not interested in further surgery and is doing well with management of his colostomy.  Patient will continue close followup with medical  oncology he will continued to be monitored closely for signs of recurrence.  Patient will return to see me in 6 months.   Velora Heckler, MD, FACS General & Endocrine Surgery [facility]

## 2011-01-15 ENCOUNTER — Encounter (HOSPITAL_BASED_OUTPATIENT_CLINIC_OR_DEPARTMENT_OTHER): Payer: Medicare HMO | Admitting: Oncology

## 2011-01-15 ENCOUNTER — Other Ambulatory Visit: Payer: Self-pay | Admitting: Oncology

## 2011-01-15 DIAGNOSIS — I4891 Unspecified atrial fibrillation: Secondary | ICD-10-CM

## 2011-01-15 DIAGNOSIS — I10 Essential (primary) hypertension: Secondary | ICD-10-CM

## 2011-01-15 DIAGNOSIS — C19 Malignant neoplasm of rectosigmoid junction: Secondary | ICD-10-CM

## 2011-01-15 DIAGNOSIS — Z452 Encounter for adjustment and management of vascular access device: Secondary | ICD-10-CM

## 2011-01-15 DIAGNOSIS — C20 Malignant neoplasm of rectum: Secondary | ICD-10-CM

## 2011-01-15 LAB — CBC WITH DIFFERENTIAL/PLATELET
Basophils Absolute: 0 10*3/uL (ref 0.0–0.1)
Eosinophils Absolute: 0.1 10*3/uL (ref 0.0–0.5)
HCT: 35.5 % — ABNORMAL LOW (ref 38.4–49.9)
HGB: 12.3 g/dL — ABNORMAL LOW (ref 13.0–17.1)
MCH: 34.2 pg — ABNORMAL HIGH (ref 27.2–33.4)
MCV: 98.9 fL — ABNORMAL HIGH (ref 79.3–98.0)
NEUT#: 2.6 10*3/uL (ref 1.5–6.5)
NEUT%: 74.9 % (ref 39.0–75.0)
RDW: 14.4 % (ref 11.0–14.6)
lymph#: 0.5 10*3/uL — ABNORMAL LOW (ref 0.9–3.3)

## 2011-01-15 LAB — CEA: CEA: 1.4 ng/mL (ref 0.0–5.0)

## 2011-01-15 LAB — COMPREHENSIVE METABOLIC PANEL
Albumin: 3.8 g/dL (ref 3.5–5.2)
BUN: 20 mg/dL (ref 6–23)
Calcium: 9.2 mg/dL (ref 8.4–10.5)
Chloride: 104 mEq/L (ref 96–112)
Creatinine, Ser: 1.34 mg/dL (ref 0.50–1.35)
Glucose, Bld: 210 mg/dL — ABNORMAL HIGH (ref 70–99)
Potassium: 4 mEq/L (ref 3.5–5.3)

## 2011-03-31 ENCOUNTER — Encounter: Payer: Self-pay | Admitting: *Deleted

## 2011-03-31 ENCOUNTER — Telehealth: Payer: Self-pay | Admitting: *Deleted

## 2011-03-31 NOTE — Telephone Encounter (Signed)
Called and asked patient to come in earlier than his ct scan to have labs drawn, patient to come in at 7:45 on 04-09-11 for labs, per c.l. Hickerson, patient notified.

## 2011-04-03 ENCOUNTER — Encounter: Payer: Self-pay | Admitting: *Deleted

## 2011-04-09 ENCOUNTER — Encounter (HOSPITAL_COMMUNITY)
Admission: RE | Admit: 2011-04-09 | Discharge: 2011-04-09 | Disposition: A | Discharge status: Home / Self Care | Payer: Medicare HMO | Source: Ambulatory Visit | Attending: Oncology | Admitting: Oncology

## 2011-04-09 ENCOUNTER — Other Ambulatory Visit (HOSPITAL_BASED_OUTPATIENT_CLINIC_OR_DEPARTMENT_OTHER): Payer: Medicare HMO | Admitting: Lab

## 2011-04-09 ENCOUNTER — Other Ambulatory Visit: Payer: Self-pay | Admitting: Medical

## 2011-04-09 ENCOUNTER — Other Ambulatory Visit: Payer: Self-pay | Admitting: Oncology

## 2011-04-09 DIAGNOSIS — R0602 Shortness of breath: Secondary | ICD-10-CM | POA: Insufficient documentation

## 2011-04-09 DIAGNOSIS — I10 Essential (primary) hypertension: Secondary | ICD-10-CM

## 2011-04-09 DIAGNOSIS — C19 Malignant neoplasm of rectosigmoid junction: Secondary | ICD-10-CM

## 2011-04-09 DIAGNOSIS — K5730 Diverticulosis of large intestine without perforation or abscess without bleeding: Secondary | ICD-10-CM | POA: Insufficient documentation

## 2011-04-09 DIAGNOSIS — D696 Thrombocytopenia, unspecified: Secondary | ICD-10-CM

## 2011-04-09 DIAGNOSIS — I4891 Unspecified atrial fibrillation: Secondary | ICD-10-CM

## 2011-04-09 DIAGNOSIS — I723 Aneurysm of iliac artery: Secondary | ICD-10-CM | POA: Insufficient documentation

## 2011-04-09 DIAGNOSIS — C20 Malignant neoplasm of rectum: Secondary | ICD-10-CM

## 2011-04-09 DIAGNOSIS — I714 Abdominal aortic aneurysm, without rupture, unspecified: Secondary | ICD-10-CM | POA: Insufficient documentation

## 2011-04-09 DIAGNOSIS — K7689 Other specified diseases of liver: Secondary | ICD-10-CM | POA: Insufficient documentation

## 2011-04-09 DIAGNOSIS — K828 Other specified diseases of gallbladder: Secondary | ICD-10-CM | POA: Insufficient documentation

## 2011-04-09 DIAGNOSIS — D1779 Benign lipomatous neoplasm of other sites: Secondary | ICD-10-CM | POA: Insufficient documentation

## 2011-04-09 DIAGNOSIS — R609 Edema, unspecified: Secondary | ICD-10-CM | POA: Insufficient documentation

## 2011-04-09 DIAGNOSIS — K4090 Unilateral inguinal hernia, without obstruction or gangrene, not specified as recurrent: Secondary | ICD-10-CM | POA: Insufficient documentation

## 2011-04-09 DIAGNOSIS — Z933 Colostomy status: Secondary | ICD-10-CM | POA: Insufficient documentation

## 2011-04-09 DIAGNOSIS — R197 Diarrhea, unspecified: Secondary | ICD-10-CM | POA: Insufficient documentation

## 2011-04-09 DIAGNOSIS — Z452 Encounter for adjustment and management of vascular access device: Secondary | ICD-10-CM

## 2011-04-09 LAB — CMP (CANCER CENTER ONLY)
Albumin: 3.2 g/dL — ABNORMAL LOW (ref 3.3–5.5)
Alkaline Phosphatase: 67 U/L (ref 26–84)
Calcium: 8.8 mg/dL (ref 8.0–10.3)
Chloride: 98 mEq/L (ref 98–108)
Glucose, Bld: 136 mg/dL — ABNORMAL HIGH (ref 73–118)
Potassium: 4.6 mEq/L (ref 3.3–4.7)
Sodium: 133 mEq/L (ref 128–145)
Total Protein: 6.5 g/dL (ref 6.4–8.1)

## 2011-04-09 LAB — CBC WITH DIFFERENTIAL/PLATELET
Eosinophils Absolute: 0.2 10*3/uL (ref 0.0–0.5)
HGB: 13.8 g/dL (ref 13.0–17.1)
MCV: 97.7 fL (ref 79.3–98.0)
MONO#: 0.4 10*3/uL (ref 0.1–0.9)
MONO%: 8.4 % (ref 0.0–14.0)
NEUT#: 3.8 10*3/uL (ref 1.5–6.5)
RBC: 4.13 10*6/uL — ABNORMAL LOW (ref 4.20–5.82)
RDW: 15 % — ABNORMAL HIGH (ref 11.0–14.6)
WBC: 5 10*3/uL (ref 4.0–10.3)
lymph#: 0.6 10*3/uL — ABNORMAL LOW (ref 0.9–3.3)

## 2011-04-09 MED ORDER — IOHEXOL 300 MG/ML  SOLN
100.0000 mL | Freq: Once | INTRAMUSCULAR | Status: AC | PRN
  Administered 2011-04-09: 100 mL via INTRAVENOUS

## 2011-04-16 ENCOUNTER — Telehealth: Payer: Self-pay | Admitting: Oncology

## 2011-04-16 ENCOUNTER — Ambulatory Visit (HOSPITAL_BASED_OUTPATIENT_CLINIC_OR_DEPARTMENT_OTHER): Payer: Medicare HMO | Admitting: Oncology

## 2011-04-16 VITALS — BP 114/65 | HR 70 | Temp 97.7°F | Ht 71.0 in | Wt 207.3 lb

## 2011-04-16 DIAGNOSIS — C19 Malignant neoplasm of rectosigmoid junction: Secondary | ICD-10-CM

## 2011-04-16 DIAGNOSIS — I4891 Unspecified atrial fibrillation: Secondary | ICD-10-CM

## 2011-04-16 DIAGNOSIS — R5381 Other malaise: Secondary | ICD-10-CM

## 2011-04-16 DIAGNOSIS — K6289 Other specified diseases of anus and rectum: Secondary | ICD-10-CM

## 2011-04-16 MED ORDER — SODIUM CHLORIDE 0.9 % IJ SOLN
10.0000 mL | INTRAMUSCULAR | Status: DC | PRN
  Administered 2011-04-16: 10 mL via INTRAVENOUS
  Filled 2011-04-16: qty 10

## 2011-04-16 MED ORDER — HEPARIN SOD (PORK) LOCK FLUSH 100 UNIT/ML IV SOLN
500.0000 [IU] | Freq: Once | INTRAVENOUS | Status: AC | PRN
  Administered 2011-04-16: 500 [IU] via INTRAVENOUS
  Filled 2011-04-16: qty 5

## 2011-04-16 NOTE — Progress Notes (Signed)
Hematology and Oncology Follow Up Visit  Vincent [name] [id number] 01-08-1933 77 y.o. 04/16/2011 12:28 PM  CC: Velora Heckler, MD  Tracey Harries, M.D.  Iva Boop, MD,FACG  Billie Lade, Ph.D., M.D.    Principle Diagnosis: This is a 77 year old gentleman diagnosed with adenocarcinoma of the rectum.  He had T4a N2 disease diagnosed in 2011.  He presented obstruction of the distal rectal area.   Prior Therapy: 1. Status post rectosigmoid resection and segmental resection on February 13, 2010.  Tumor was poorly differentiated adenocarcinoma, 8 out of 16 lymph nodes involved, tumor not completely resected at that time.  2. The patient received radiation therapy adamantly concomitantly with Xeloda.  Therapy concluded in January 2012.  3. Treated with adjuvant FOLFOX for a total of 7 cycles of therapy.  The therapy concluded Sep 16, 2010.  Current therapy: Active surveillance and watchful observation.  Interim History:  Vincent [name] presents today for an office followup visit.  Overall he reports that he is doing quite well.  He still continues to have some fatigue, however, he is able to go in and work at times without difficulty.  Every now and then he has to take a break and rest.  He is not reporting any worsening peripheral neuropathy.  He is not reporting any major complications.  He is not reporting any nausea, vomiting, diarrhea, or constipation, any type of the abdominal pain or distention.  Overall his performance status and activity level remain stable.  He denies any headaches, any visual changes, any dysphagia, odynophagia, any nausea, vomiting, diarrhea, constipation, chest pain, shortness of breath, productive cough, abdominal pain, abdominal swelling, lower extremity paresthesia, bowel or bladder incontinence, any obvious bleeding such as melena, hematochezia, or hematuria.  He denies any fevers, chills, or night sweats, any type of weight loss or anorexia, any palpable lymph node  swelling.  Overall reports he has a good appetite and is eating and drinking quite well.  Medications: I have reviewed the patient's current medications. Current outpatient prescriptions:albuterol-ipratropium (COMBIVENT) 18-103 MCG/ACT inhaler, Inhale 1 puff into the lungs 4 (four) times daily.  , Disp: , Rfl: ;  aspirin 81 MG tablet, Take 81 mg by mouth daily.  , Disp: , Rfl: ;  carvedilol (COREG) 6.25 MG tablet, Take 6.25 mg by mouth 2 (two) times daily with a meal.  , Disp: , Rfl: ;  digoxin (LANOXIN) 0.125 MG tablet, Take 125 mcg by mouth daily.  , Disp: , Rfl:  diphenoxylate-atropine (LOMOTIL) 2.5-0.025 MG per tablet, Take 1 tablet by mouth 4 (four) times daily as needed.  , Disp: , Rfl: ;  fish oil-omega-3 fatty acids 1000 MG capsule, Take 1 g by mouth daily.  , Disp: , Rfl: ;  Fluticasone-Salmeterol (ADVAIR) 250-50 MCG/DOSE AEPB, Inhale 1 puff into the lungs every 12 (twelve) hours.  , Disp: , Rfl: ;  lidocaine-prilocaine (EMLA) cream, Apply 1 application topically as needed.  , Disp: , Rfl:  ondansetron (ZOFRAN) 8 MG tablet, Take by mouth every 8 (eight) hours as needed.  , Disp: , Rfl: ;  simvastatin (ZOCOR) 40 MG tablet, Take 40 mg by mouth at bedtime.  , Disp: , Rfl: ;  traMADol (ULTRAM) 50 MG tablet, Take 50 mg by mouth every 6 (six) hours as needed. Maximum dose= 8 tablets per day , Disp: , Rfl: ;  warfarin (COUMADIN) 2.5 MG tablet, Take 2.5 mg by mouth daily.  , Disp: , Rfl:  Current facility-administered medications:heparin lock flush 100  unit/mL, 500 Units, Intravenous, Once PRN, [name], MD;  sodium chloride 0.9 % injection 10 mL, 10 mL, Intravenous, PRN, [name], MD  Allergies: No Known Allergies  Past Medical History, Surgical history, Social history, and Family History were reviewed and updated.  Review of Systems: Constitutional:  Negative for fever, chills, night sweats, anorexia, weight loss, pain. Cardiovascular: no chest pain or dyspnea on exertion Respiratory: no  cough, shortness of breath, or wheezing Neurological: no TIA or stroke symptoms Dermatological: negative ENT: negative Skin: Negative. Gastrointestinal: no abdominal pain, change in bowel habits, or black or bloody stools Genito-Urinary: no dysuria, trouble voiding, or hematuria Hematological and Lymphatic: negative Breast: negative Musculoskeletal: negative Remaining ROS negative. Physical Exam: Blood pressure 114/65, pulse 70, temperature 97.7 F (36.5 C), temperature source Oral, height 5\' 11"  (1.803 m), weight 207 lb 4.8 oz (94.031 kg). ECOG: 1 General appearance: alert Head: Normocephalic, without obvious abnormality, atraumatic Neck: no adenopathy, no carotid bruit, no JVD, supple, symmetrical, trachea midline and thyroid not enlarged, symmetric, no tenderness/mass/nodules Lymph nodes: Cervical, supraclavicular, and axillary nodes normal. Heart:regular rate and rhythm, S1, S2 normal, no murmur, click, rub or gallop Lung:chest clear, no wheezing, rales, normal symmetric air entry Abdomin: soft, non-tender, without masses or organomegaly EXT:no erythema, induration, or nodules   Lab Results: Lab Results  Component Value Date   WBC 5.0 04/09/2011   HGB 13.8 04/09/2011   HCT 40.4 04/09/2011   MCV 97.7 04/09/2011   PLT 126* 04/09/2011     Chemistry      Component Value Date/Time   NA 133 04/09/2011 0838   NA 141 01/15/2011 0913   NA 141 01/15/2011 0913   NA 141 01/15/2011 0913   K 4.6 04/09/2011 0838   K 4.0 01/15/2011 0913   K 4.0 01/15/2011 0913   K 4.0 01/15/2011 0913   CL 98 04/09/2011 0838   CL 104 01/15/2011 0913   CL 104 01/15/2011 0913   CL 104 01/15/2011 0913   CO2 31 04/09/2011 0838   CO2 25 01/15/2011 0913   CO2 25 01/15/2011 0913   CO2 25 01/15/2011 0913   BUN 18 04/09/2011 0838   BUN 20 01/15/2011 0913   BUN 20 01/15/2011 0913   BUN 20 01/15/2011 0913   CREATININE 1.3* 04/09/2011 0838   CREATININE 1.34 01/15/2011 0913   CREATININE 1.34 01/15/2011 0913   CREATININE 1.34  01/15/2011 0913      Component Value Date/Time   CALCIUM 8.8 04/09/2011 0838   CALCIUM 9.2 01/15/2011 0913   CALCIUM 9.2 01/15/2011 0913   CALCIUM 9.2 01/15/2011 0913   ALKPHOS 67 04/09/2011 0838   ALKPHOS 54 01/15/2011 0913   ALKPHOS 54 01/15/2011 0913   ALKPHOS 54 01/15/2011 0913   AST 18 04/09/2011 0838   AST 13 01/15/2011 0913   AST 13 01/15/2011 0913   AST 13 01/15/2011 0913   ALT 10 01/15/2011 0913   ALT 10 01/15/2011 0913   ALT 10 01/15/2011 0913   BILITOT 0.70 04/09/2011 0838   BILITOT 0.6 01/15/2011 0913   BILITOT 0.6 01/15/2011 0913   BILITOT 0.6 01/15/2011 0913     Results for Vincent [name], Vincent [name] (MRN [id number]) as of 04/16/2011 12:05  Ref. Range 04/09/2011 08:03  CEA Latest Range: 0.0-5.0 ng/mL 2.5    Radiological Studies: 04/09/2011  CT CHEST, ABDOMEN AND PELVIS WITH CONTRAST  Technique: Contiguous axial images of the chest abdomen and pelvis  were obtained after IV contrast administration.  Contrast: 100 ml Omnipaque-300  Comparison:  10/20/2010  CT CHEST  Findings: Lung windows demonstrate no nodules or airspace  opacities.  Soft tissue windows demonstrate a left-sided Port-A-Cath which  terminates at the mid to low SVC. Mild lipomatous hypertrophy of  the interatrial septum. Normal heart size with coronary artery  atherosclerosis. Focal anterior pericardial fluid/thickening is  unchanged. No pleural fluid. Stable small mediastinal lymph nodes  without adenopathy. No central pulmonary embolism, on this non-  dedicated study.  A right intercostal muscular lipoma on image 40 is unchanged at 2.3  cm.  IMPRESSION:  1. No acute process or evidence of metastatic disease in the chest.  2. Similar anterior pericardial fluid/thickening.  CT ABDOMEN AND PELVIS  Findings: Scattered too small to characterize liver lesions which  are unchanged. All present back 10/11 and likely small cysts.  Normal spleen. Underdistended gastric body. Normal pancreas,  gallbladder, biliary tract, adrenal  glands. Interpolar left renal  cyst. Normal right kidney. Infrarenal abdominal aortic aneurysm  is not significantly changed. Somewhat bilobed, measuring 3.3 cm  in its superior portion and 4.3 cm just above the bifurcation. No  surrounding hemorrhage. No retroperitoneal or retrocrural  adenopathy.  Perianal/perirectal edema is moderate and not significantly  changed. Hartmann's pouch. Persistent moderate wall thickening of  the rectum just inferior to the surgical sutures. A perirectal  node at the 10 o'clock position measures 6 mm on image 114 and is  not definitely present on the prior. 8 mm perirectal node on image  116 at the 9 o'clock position is also new or enlarged since the  prior. Overall perirectal nodal burden is, however decreased from  05/27/2010.  Left lower quadrant colostomy. Extensive colonic diverticulosis.  Normal terminal ileum. Normal small bowel without abdominal  ascites.  The bilateral common iliac arteries measure 1.4 cm maximally,  unchanged. A tiny fat containing left inguinal hernia. No pelvic  sidewall adenopathy. 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  1.4 cm on image 105. Unchanged  Bladder wall thickening and pericystic edema are similar. Normal  prostate, without significant free pelvic fluid.  Prior lumbar laminectomy.  IMPRESSION:  1. Redemonstration of rectal and perirectal edema, presumably  related to radiation change. Increased size of perirectal nodes  since 10/20/2010. Slightly decreased since 05/27/2010. Although  these could be radiation related, localized nodal disease cannot be  excluded.  2. Otherwise, no evidence of metastatic disease within the abdomen  or pelvis.  3. Similar infrarenal abdominal aortic aneurysm. Left internal  iliac artery aneurysm. Bilateral common iliac artery ectasia.  4. Edema within and surrounding urinary bladder is likely related  to radiation cystitis.    Impression and Plan: This is a pleasant  77 year old gentleman with the following issues:   1. Advanced rectal carcinoma, presented with a T4 N2 disease.  He presented with a complete obstruction.  After segmental resection, he underwent radiation therapy with Xeloda and subsequently systemic chemotherapy.  At this time, he has really no evidence of any recurrent disease, at least that is grossly evident.  He is certainly at high risk of local recurrence, as well as systemic recurrence.  His last CT scanof the chest, abdomen, and pelvis did not reveal any type of recurrent or metastatic disease.  His next CT scan is due in 6-8 months. 2. Port-A-Cath management will be  Flushed every 6-8 weeks.   3. Atrial fibrillation.  He is currently anticoagulated on Coumadin.  4. Hypertension.  This is stable.  5. Followup:  Vincent [name] will follow up in 4  months.   [facility], MD 12/13/201212:28 PM

## 2011-04-16 NOTE — Telephone Encounter (Signed)
gve the pt his feb,april 2013 appt calendar °

## 2011-06-17 ENCOUNTER — Ambulatory Visit (HOSPITAL_BASED_OUTPATIENT_CLINIC_OR_DEPARTMENT_OTHER): Payer: Medicare Other

## 2011-06-17 DIAGNOSIS — C20 Malignant neoplasm of rectum: Secondary | ICD-10-CM

## 2011-06-17 MED ORDER — HEPARIN SOD (PORK) LOCK FLUSH 100 UNIT/ML IV SOLN
500.0000 [IU] | Freq: Once | INTRAVENOUS | Status: AC
  Administered 2011-06-17: 500 [IU] via INTRAVENOUS
  Filled 2011-06-17: qty 5

## 2011-06-17 MED ORDER — SODIUM CHLORIDE 0.9 % IJ SOLN
10.0000 mL | INTRAMUSCULAR | Status: DC | PRN
  Administered 2011-06-17: 10 mL via INTRAVENOUS
  Filled 2011-06-17: qty 10

## 2011-07-09 ENCOUNTER — Encounter (INDEPENDENT_AMBULATORY_CARE_PROVIDER_SITE_OTHER): Payer: Self-pay | Admitting: Surgery

## 2011-07-13 ENCOUNTER — Encounter (INDEPENDENT_AMBULATORY_CARE_PROVIDER_SITE_OTHER): Payer: Self-pay | Admitting: Surgery

## 2011-07-13 ENCOUNTER — Ambulatory Visit (INDEPENDENT_AMBULATORY_CARE_PROVIDER_SITE_OTHER): Payer: Medicare Other | Admitting: Surgery

## 2011-07-13 VITALS — BP 116/62 | HR 70 | Temp 96.8°F | Resp 18 | Ht 71.0 in | Wt 214.2 lb

## 2011-07-13 DIAGNOSIS — C20 Malignant neoplasm of rectum: Secondary | ICD-10-CM

## 2011-07-13 NOTE — Progress Notes (Signed)
Visit Diagnoses: 1. Rectal carcinoma     HISTORY: Patient returns for a scheduled followup. Vincent [name] continues to do well. He is scheduled to be seen at the cancer Center next week. His only complaint today is mucous discharge from the rectum with physical activity. On rare occasion he is seen a small amount of blood in the discharge. He denies any rectal pain. Colostomy is working well. He does take Vicodin on occasion for low back pain. He is asked for a refill of his prescription today. His last prescription lasted for several months.  CT scans in December 2012 showed some adenopathy in the pelvis. There was edema around the rectum. There was no gross tumor. There was no sign of metastatic disease.  PERTINENT REVIEW OF SYSTEMS: Occasional mucus discharge from the rectum. Occasional discomfort at the site of Port-A-Cath left chest wall  EXAM: HEENT: normocephalic; pupils equal and reactive; sclerae clear; dentition good; mucous membranes moist NECK:  symmetric on extension; no palpable anterior or posterior cervical lymphadenopathy; no supraclavicular masses; no tenderness CHEST: clear to auscultation bilaterally without rales, rhonchi, or wheezes;  Slight tenderness at Port-A-Cath due to underlying suture material CARDIAC: regular rate and rhythm without significant murmur; peripheral pulses are full ABDOMEN: Soft without distention. Midline incision well healed without evidence of hernia. Stoma is viable. No sign of ulceration. Small parastomal hernia which is spontaneously reducible. EXT:  non-tender without edema; no deformity NEURO: no gross focal deficits; no sign of tremor   IMPRESSION: Rectal carcinoma, clinical remission following adjuvant chemotherapy and radiation treatment  PLAN: At this point, the patient is going well overall. I do not have a good remedy for the mucus discharge from the rectum. I suspect this as a consequence of residual tumor and radiation change. The  patient is not interested in colostomy reversal at this time.  Patient is scheduled to be seen at the cancer Center next week.  Patient will return to see me as needed.  Velora Heckler, MD, FACS General & Endocrine Surgery [facility]

## 2011-07-27 IMAGING — CR DG CHEST 1V PORT
1 series · 1 of 1 positions shown · non-contrast
Comparison: 02/16/2010

CLINICAL DATA: COPD, colon mass.

PORTABLE CHEST - 1 VIEW

[series [date]]
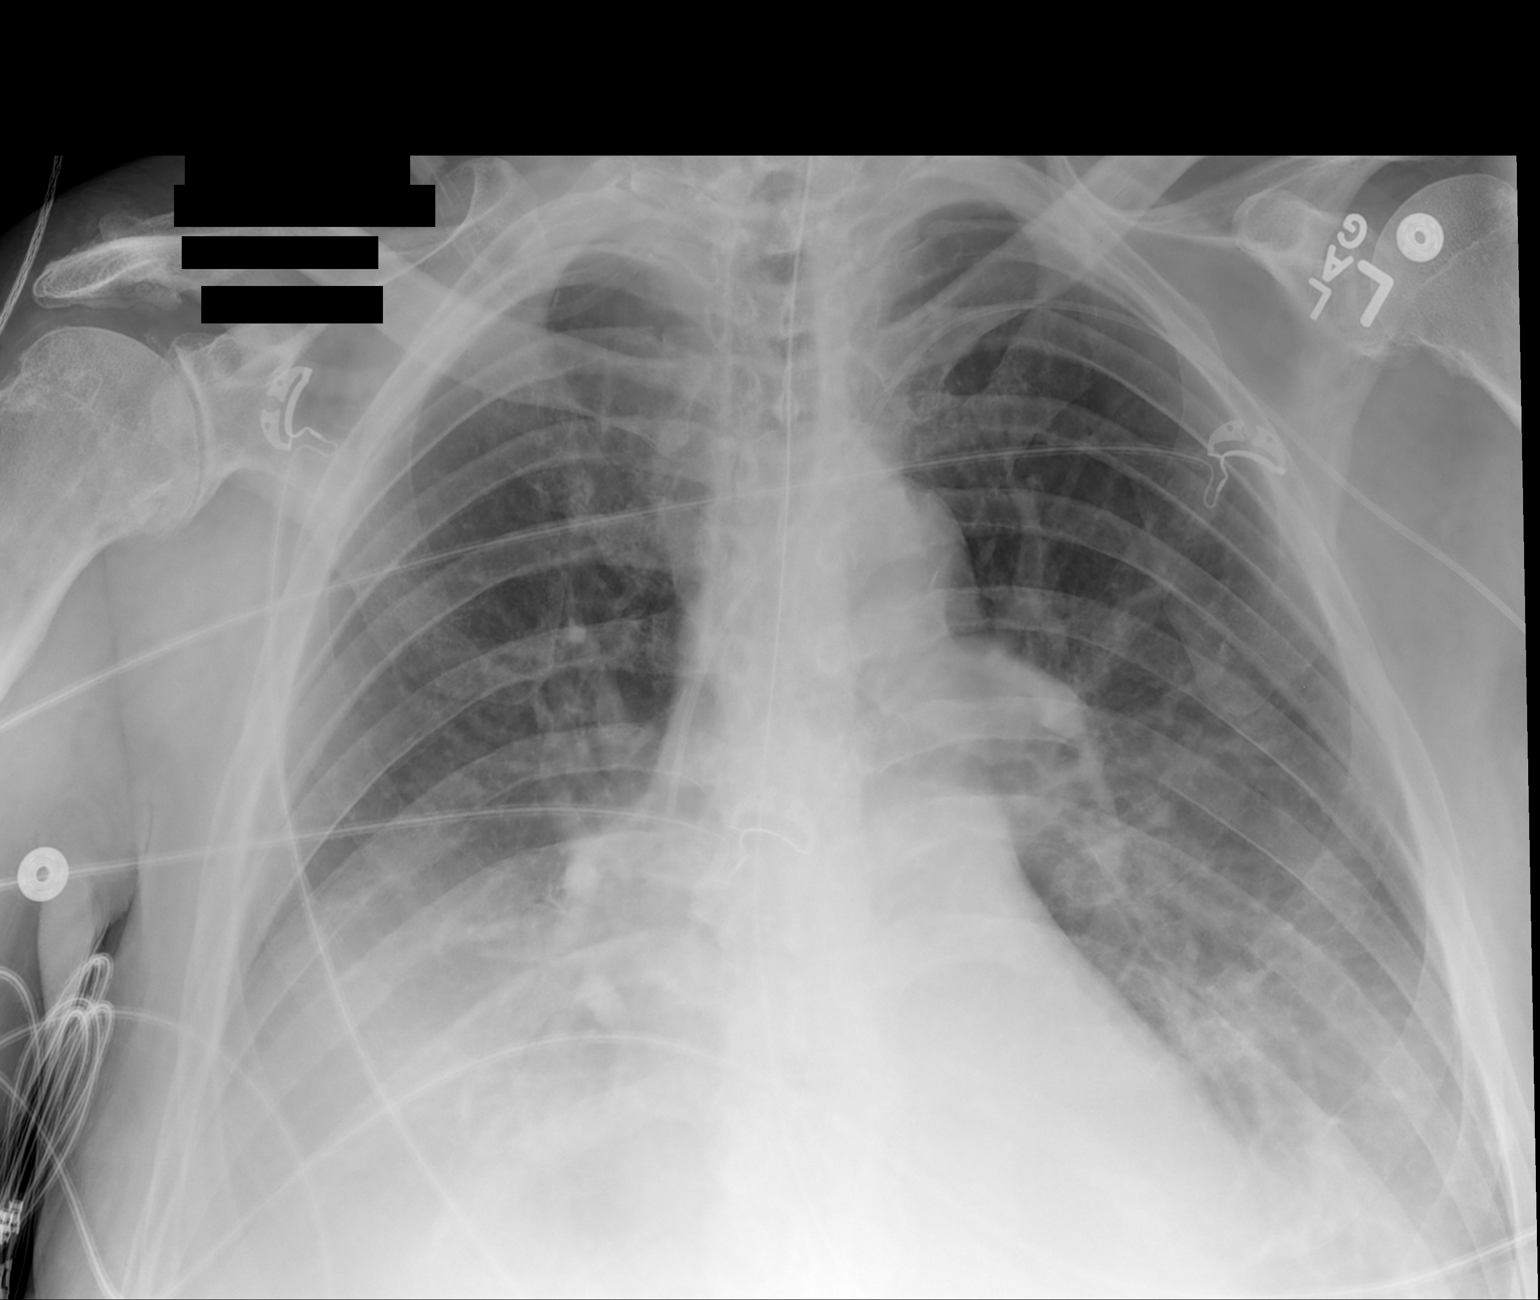

[1 of 1 positions shown; findings below may reference images not displayed]

FINDINGS: NG tube remains in the stomach.  Continued bibasilar
atelectasis and bilateral effusions, unchanged.  Mild vascular
congestion noted.
IMPRESSION: No significant change since prior study.

## 2011-07-28 IMAGING — CR DG CHEST 1V PORT
1 series · 1 of 1 positions shown · non-contrast
Comparison: 1 day prior

CLINICAL DATA: Airspace disease.  COPD.  Pleural effusions.

PORTABLE CHEST - 1 VIEW

[series 1]
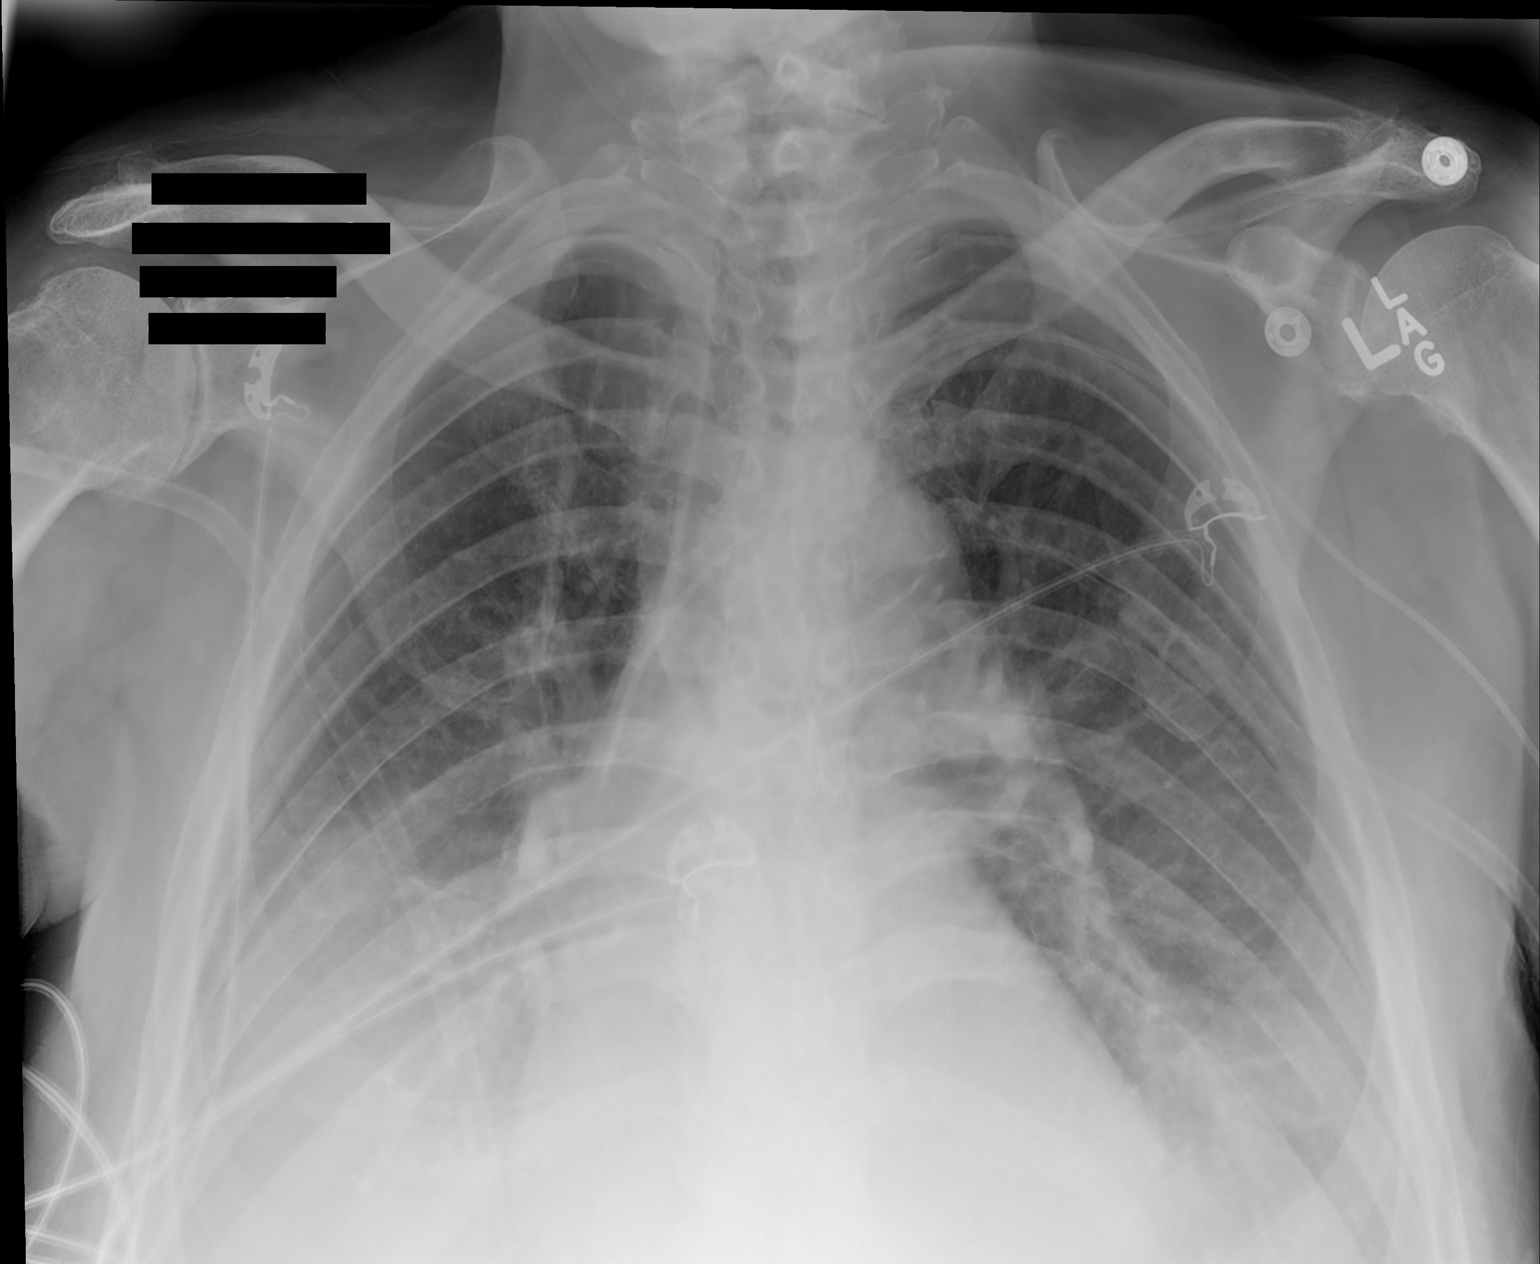

[1 of 1 positions shown; findings below may reference images not displayed]

FINDINGS: Left-sided PICC line unchanged.  Interval removal of
nasogastric tube.  Underlying hyperinflation.  Mild cardiomegaly.
Small right greater than left pleural effusions and bibasilar
airspace disease are similar. No pneumothorax.
IMPRESSION: 1. No significant change since one day prior.
2.  Bilateral pleural effusions and bibasilar airspace disease.
3.  Interval removal of nasogastric tube.

## 2011-08-19 ENCOUNTER — Other Ambulatory Visit (HOSPITAL_BASED_OUTPATIENT_CLINIC_OR_DEPARTMENT_OTHER): Payer: Medicare Other | Admitting: Lab

## 2011-08-19 ENCOUNTER — Ambulatory Visit (HOSPITAL_BASED_OUTPATIENT_CLINIC_OR_DEPARTMENT_OTHER): Payer: Medicare Other | Admitting: Oncology

## 2011-08-19 ENCOUNTER — Telehealth: Payer: Self-pay | Admitting: Oncology

## 2011-08-19 VITALS — BP 112/62 | HR 72 | Temp 96.9°F | Ht 71.0 in | Wt 211.9 lb

## 2011-08-19 DIAGNOSIS — I10 Essential (primary) hypertension: Secondary | ICD-10-CM

## 2011-08-19 DIAGNOSIS — K6289 Other specified diseases of anus and rectum: Secondary | ICD-10-CM

## 2011-08-19 DIAGNOSIS — C20 Malignant neoplasm of rectum: Secondary | ICD-10-CM

## 2011-08-19 DIAGNOSIS — I4891 Unspecified atrial fibrillation: Secondary | ICD-10-CM

## 2011-08-19 DIAGNOSIS — Z7901 Long term (current) use of anticoagulants: Secondary | ICD-10-CM

## 2011-08-19 DIAGNOSIS — R232 Flushing: Secondary | ICD-10-CM

## 2011-08-19 LAB — CBC WITH DIFFERENTIAL/PLATELET
BASO%: 0.7 % (ref 0.0–2.0)
LYMPH%: 10.7 % — ABNORMAL LOW (ref 14.0–49.0)
MCHC: 34.1 g/dL (ref 32.0–36.0)
MCV: 98.1 fL — ABNORMAL HIGH (ref 79.3–98.0)
MONO#: 0.6 10*3/uL (ref 0.1–0.9)
MONO%: 8.7 % (ref 0.0–14.0)
Platelets: 147 10*3/uL (ref 140–400)
RBC: 3.93 10*6/uL — ABNORMAL LOW (ref 4.20–5.82)
WBC: 6.3 10*3/uL (ref 4.0–10.3)
nRBC: 0 % (ref 0–0)

## 2011-08-19 LAB — COMPREHENSIVE METABOLIC PANEL
ALT: 11 U/L (ref 0–53)
AST: 13 U/L (ref 0–37)
Creatinine, Ser: 1.21 mg/dL (ref 0.50–1.35)
Total Bilirubin: 0.7 mg/dL (ref 0.3–1.2)

## 2011-08-19 MED ORDER — SODIUM CHLORIDE 0.9 % IJ SOLN
10.0000 mL | Freq: Once | INTRAMUSCULAR | Status: AC
  Administered 2011-08-19: 10 mL via INTRAVENOUS
  Filled 2011-08-19: qty 10

## 2011-08-19 MED ORDER — HEPARIN SOD (PORK) LOCK FLUSH 100 UNIT/ML IV SOLN
500.0000 [IU] | Freq: Once | INTRAVENOUS | Status: AC
  Administered 2011-08-19: 500 [IU] via INTRAVENOUS
  Filled 2011-08-19: qty 5

## 2011-08-19 NOTE — Telephone Encounter (Signed)
Gave pt calendar for June ,port flush, then pt will see MD on 12/18/11 after CT/lab//port flush. Gave pt oral contrast, NPO after 7am

## 2011-08-19 NOTE — Progress Notes (Signed)
Hematology and Oncology Follow Up Visit  Vincent [name] [id number] 1932-08-24 78 y.o. 08/19/2011 12:02 PM  CC: Velora Heckler, MD  Tracey Harries, M.D.  Iva Boop, MD,FACG  Billie Lade, Ph.D., M.D.    Principle Diagnosis: This is a 78 year old gentleman diagnosed with adenocarcinoma of the rectum.  He had T4a N2 disease diagnosed in 2011.  He presented obstruction of the distal rectal area.   Prior Therapy: 1. Status post rectosigmoid resection and segmental resection on February 13, 2010.  Tumor was poorly differentiated adenocarcinoma, 8 out of 16 lymph nodes involved, tumor not completely resected at that time.  2. The patient received radiation therapy adamantly concomitantly with Xeloda.  Therapy concluded in January 2012.  3. Treated with adjuvant FOLFOX for a total of 7 cycles of therapy.  The therapy concluded Sep 16, 2010.  Current therapy: Active surveillance and watchful observation.  Interim History:  Vincent [name] presents today for an office followup visit.  Overall he reports that he is doing quite well.  He still continues to have some fatigue, however, he is able to go in and work at times without difficulty.  He is not reporting any worsening peripheral neuropathy.  He is not reporting any major complications.  He is not reporting any nausea, vomiting, diarrhea, he is reporting constipation, but no abdominal pain or distention.  Overall his performance status and activity level remain stable.  He denies any headaches, any visual changes, any dysphagia, odynophagia, any nausea, vomiting, diarrhea, constipation, chest pain, shortness of breath, productive cough, abdominal pain, abdominal swelling, lower extremity paresthesia, bowel or bladder incontinence, any obvious bleeding such as melena, hematochezia, or hematuria.  He denies any fevers, chills, or night sweats, any type of weight loss or anorexia, any palpable lymph node swelling.  Overall reports he has a good appetite and  is eating and drinking quite well.  Medications: I have reviewed the patient's current medications. Current outpatient prescriptions:albuterol-ipratropium (COMBIVENT) 18-103 MCG/ACT inhaler, Inhale 1 puff into the lungs 4 (four) times daily.  , Disp: , Rfl: ;  aspirin 81 MG tablet, Take 81 mg by mouth daily.  , Disp: , Rfl: ;  carvedilol (COREG) 6.25 MG tablet, Take 6.25 mg by mouth 2 (two) times daily with a meal.  , Disp: , Rfl: ;  digoxin (LANOXIN) 0.125 MG tablet, Take 125 mcg by mouth daily.  , Disp: , Rfl:  diphenoxylate-atropine (LOMOTIL) 2.5-0.025 MG per tablet, Take 1 tablet by mouth 4 (four) times daily as needed.  , Disp: , Rfl: ;  fish oil-omega-3 fatty acids 1000 MG capsule, Take 1 g by mouth daily.  , Disp: , Rfl: ;  Fluticasone-Salmeterol (ADVAIR) 250-50 MCG/DOSE AEPB, Inhale 1 puff into the lungs every 12 (twelve) hours.  , Disp: , Rfl: ;  lidocaine-prilocaine (EMLA) cream, Apply 1 application topically as needed.  , Disp: , Rfl:  ondansetron (ZOFRAN) 8 MG tablet, Take by mouth every 8 (eight) hours as needed.  , Disp: , Rfl: ;  simvastatin (ZOCOR) 40 MG tablet, Take 40 mg by mouth at bedtime.  , Disp: , Rfl: ;  traMADol (ULTRAM) 50 MG tablet, Take 50 mg by mouth every 6 (six) hours as needed. Maximum dose= 8 tablets per day , Disp: , Rfl: ;  warfarin (COUMADIN) 2.5 MG tablet, Take 2.5 mg by mouth daily.  , Disp: , Rfl:  Current facility-administered medications:heparin lock flush 100 unit/mL, 500 Units, Intravenous, Once, Benjiman Core, MD, 500 Units at 08/19/11  1200;  sodium chloride 0.9 % injection 10 mL, 10 mL, Intravenous, Once, Benjiman Core, MD, 10 mL at 08/19/11 1200  Allergies: No Known Allergies  Past Medical History, Surgical history, Social history, and Family History were reviewed and updated.  Review of Systems: Constitutional:  Negative for fever, chills, night sweats, anorexia, weight loss, pain. Cardiovascular: no chest pain or dyspnea on exertion Respiratory: no  cough, shortness of breath, or wheezing Neurological: no TIA or stroke symptoms Dermatological: negative ENT: negative Skin: Negative. Gastrointestinal: no abdominal pain, change in bowel habits, or black or bloody stools Genito-Urinary: no dysuria, trouble voiding, or hematuria Hematological and Lymphatic: negative Breast: negative Musculoskeletal: negative Remaining ROS negative. Physical Exam: Blood pressure 112/62, pulse 72, temperature 96.9 F (36.1 C), temperature source Oral, height 5\' 11"  (1.803 m), weight 211 lb 14.4 oz (96.117 kg). ECOG: 1 General appearance: alert Head: Normocephalic, without obvious abnormality, atraumatic Neck: no adenopathy, no carotid bruit, no JVD, supple, symmetrical, trachea midline and thyroid not enlarged, symmetric, no tenderness/mass/nodules Lymph nodes: Cervical, supraclavicular, and axillary nodes normal. Heart:regular rate and rhythm, S1, S2 normal, no murmur, click, rub or gallop Lung:chest clear, no wheezing, rales, normal symmetric air entry Abdomin: soft, non-tender, without masses or organomegaly EXT:no erythema, induration, or nodules   Lab Results: Lab Results  Component Value Date   WBC 6.3 08/19/2011   HGB 13.1 08/19/2011   HCT 38.6 08/19/2011   MCV 98.1* 08/19/2011   PLT 147 08/19/2011     Ref. Range 04/09/2011 08:03  CEA Latest Range: 0.0-5.0 ng/mL 2.5     Impression and Plan: This is a pleasant 78 year old gentleman with the following issues:   1. Advanced rectal carcinoma, presented with a T4 N2 disease.  He presented with a complete obstruction.  After segmental resection, he underwent radiation therapy with Xeloda and subsequently systemic chemotherapy.  At this time, he has really no evidence of any recurrent disease, at least that is grossly evident.  He is certainly at high risk of local recurrence, as well as systemic recurrence.  His last CT scanof the chest, abdomen, and pelvis did not reveal any type of recurrent or  metastatic disease.  His next CT scan is due in 4 months. 2. Port-A-Cath management will be  Flushed today and every 6-8 weeks.   3. Atrial fibrillation.  He is currently anticoagulated on Coumadin.  4. Hypertension.  This is stable.  5. Followup:  Vincent [name] will follow up in 4 months.   [facility], MD 4/17/201312:02 PM

## 2011-12-03 HISTORY — PX: COLON SURGERY: SHX602

## 2011-12-11 ENCOUNTER — Emergency Department (HOSPITAL_COMMUNITY): Payer: Medicare Other

## 2011-12-11 ENCOUNTER — Inpatient Hospital Stay (HOSPITAL_COMMUNITY)
Admission: EM | Admit: 2011-12-11 | Discharge: 2011-12-28 | DRG: 374 | Disposition: A | Discharge status: Home Health | Payer: Medicare Other | Attending: Internal Medicine | Admitting: Internal Medicine

## 2011-12-11 ENCOUNTER — Encounter (HOSPITAL_COMMUNITY): Payer: Self-pay | Admitting: Emergency Medicine

## 2011-12-11 DIAGNOSIS — T45515A Adverse effect of anticoagulants, initial encounter: Secondary | ICD-10-CM | POA: Diagnosis present

## 2011-12-11 DIAGNOSIS — Z9049 Acquired absence of other specified parts of digestive tract: Secondary | ICD-10-CM

## 2011-12-11 DIAGNOSIS — Z7901 Long term (current) use of anticoagulants: Secondary | ICD-10-CM

## 2011-12-11 DIAGNOSIS — K651 Peritoneal abscess: Secondary | ICD-10-CM | POA: Diagnosis present

## 2011-12-11 DIAGNOSIS — R531 Weakness: Secondary | ICD-10-CM | POA: Diagnosis present

## 2011-12-11 DIAGNOSIS — G893 Neoplasm related pain (acute) (chronic): Secondary | ICD-10-CM | POA: Diagnosis present

## 2011-12-11 DIAGNOSIS — E876 Hypokalemia: Secondary | ICD-10-CM | POA: Diagnosis not present

## 2011-12-11 DIAGNOSIS — D72829 Elevated white blood cell count, unspecified: Secondary | ICD-10-CM | POA: Diagnosis not present

## 2011-12-11 DIAGNOSIS — K921 Melena: Secondary | ICD-10-CM

## 2011-12-11 DIAGNOSIS — I4891 Unspecified atrial fibrillation: Secondary | ICD-10-CM | POA: Diagnosis present

## 2011-12-11 DIAGNOSIS — K625 Hemorrhage of anus and rectum: Secondary | ICD-10-CM | POA: Diagnosis not present

## 2011-12-11 DIAGNOSIS — B965 Pseudomonas (aeruginosa) (mallei) (pseudomallei) as the cause of diseases classified elsewhere: Secondary | ICD-10-CM | POA: Diagnosis present

## 2011-12-11 DIAGNOSIS — R634 Abnormal weight loss: Secondary | ICD-10-CM

## 2011-12-11 DIAGNOSIS — I10 Essential (primary) hypertension: Secondary | ICD-10-CM | POA: Diagnosis present

## 2011-12-11 DIAGNOSIS — E119 Type 2 diabetes mellitus without complications: Secondary | ICD-10-CM | POA: Diagnosis present

## 2011-12-11 DIAGNOSIS — D696 Thrombocytopenia, unspecified: Secondary | ICD-10-CM

## 2011-12-11 DIAGNOSIS — Z79899 Other long term (current) drug therapy: Secondary | ICD-10-CM

## 2011-12-11 DIAGNOSIS — C20 Malignant neoplasm of rectum: Principal | ICD-10-CM | POA: Diagnosis present

## 2011-12-11 DIAGNOSIS — E871 Hypo-osmolality and hyponatremia: Secondary | ICD-10-CM | POA: Diagnosis present

## 2011-12-11 DIAGNOSIS — D6959 Other secondary thrombocytopenia: Secondary | ICD-10-CM | POA: Diagnosis present

## 2011-12-11 DIAGNOSIS — K6289 Other specified diseases of anus and rectum: Secondary | ICD-10-CM

## 2011-12-11 DIAGNOSIS — R791 Abnormal coagulation profile: Secondary | ICD-10-CM | POA: Diagnosis present

## 2011-12-11 DIAGNOSIS — D509 Iron deficiency anemia, unspecified: Secondary | ICD-10-CM | POA: Diagnosis present

## 2011-12-11 DIAGNOSIS — D6832 Hemorrhagic disorder due to extrinsic circulating anticoagulants: Secondary | ICD-10-CM | POA: Diagnosis present

## 2011-12-11 DIAGNOSIS — Z923 Personal history of irradiation: Secondary | ICD-10-CM

## 2011-12-11 DIAGNOSIS — R109 Unspecified abdominal pain: Secondary | ICD-10-CM

## 2011-12-11 DIAGNOSIS — R5381 Other malaise: Secondary | ICD-10-CM | POA: Diagnosis present

## 2011-12-11 DIAGNOSIS — Z85038 Personal history of other malignant neoplasm of large intestine: Secondary | ICD-10-CM

## 2011-12-11 DIAGNOSIS — D62 Acute posthemorrhagic anemia: Secondary | ICD-10-CM | POA: Diagnosis not present

## 2011-12-11 DIAGNOSIS — Z9221 Personal history of antineoplastic chemotherapy: Secondary | ICD-10-CM

## 2011-12-11 DIAGNOSIS — E236 Other disorders of pituitary gland: Secondary | ICD-10-CM | POA: Diagnosis present

## 2011-12-11 DIAGNOSIS — D649 Anemia, unspecified: Secondary | ICD-10-CM | POA: Diagnosis present

## 2011-12-11 DIAGNOSIS — Z933 Colostomy status: Secondary | ICD-10-CM

## 2011-12-11 DIAGNOSIS — C775 Secondary and unspecified malignant neoplasm of intrapelvic lymph nodes: Secondary | ICD-10-CM | POA: Diagnosis present

## 2011-12-11 HISTORY — DX: Unspecified atrial fibrillation: I48.91

## 2011-12-11 LAB — URINALYSIS, ROUTINE W REFLEX MICROSCOPIC
Glucose, UA: NEGATIVE mg/dL
Ketones, ur: NEGATIVE mg/dL
pH: 5.5 (ref 5.0–8.0)

## 2011-12-11 LAB — CBC WITH DIFFERENTIAL/PLATELET
Basophils Absolute: 0 10*3/uL (ref 0.0–0.1)
Basophils Relative: 0 % (ref 0–1)
Eosinophils Absolute: 0.1 10*3/uL (ref 0.0–0.7)
MCH: 32.6 pg (ref 26.0–34.0)
MCHC: 34.9 g/dL (ref 30.0–36.0)
Monocytes Relative: 11 % (ref 3–12)
Neutrophils Relative %: 82 % — ABNORMAL HIGH (ref 43–77)
Platelets: 157 10*3/uL (ref 150–400)
RDW: 14.8 % (ref 11.5–15.5)

## 2011-12-11 LAB — COMPREHENSIVE METABOLIC PANEL
Albumin: 2.8 g/dL — ABNORMAL LOW (ref 3.5–5.2)
Alkaline Phosphatase: 74 U/L (ref 39–117)
BUN: 21 mg/dL (ref 6–23)
Potassium: 3.7 mEq/L (ref 3.5–5.1)
Sodium: 132 mEq/L — ABNORMAL LOW (ref 135–145)
Total Protein: 6.7 g/dL (ref 6.0–8.3)

## 2011-12-11 LAB — URINE MICROSCOPIC-ADD ON

## 2011-12-11 MED ORDER — FLUTICASONE-SALMETEROL 500-50 MCG/DOSE IN AEPB
1.0000 | INHALATION_SPRAY | Freq: Two times a day (BID) | RESPIRATORY_TRACT | Status: DC
  Administered 2011-12-11 – 2011-12-28 (×33): 1 via RESPIRATORY_TRACT
  Filled 2011-12-11 (×3): qty 14

## 2011-12-11 MED ORDER — ONDANSETRON HCL 4 MG/2ML IJ SOLN
4.0000 mg | Freq: Four times a day (QID) | INTRAMUSCULAR | Status: DC | PRN
  Administered 2011-12-13 – 2011-12-28 (×10): 4 mg via INTRAVENOUS
  Filled 2011-12-11 (×10): qty 2

## 2011-12-11 MED ORDER — IPRATROPIUM-ALBUTEROL 18-103 MCG/ACT IN AERO
1.0000 | INHALATION_SPRAY | Freq: Four times a day (QID) | RESPIRATORY_TRACT | Status: DC
  Filled 2011-12-11: qty 14.7

## 2011-12-11 MED ORDER — SIMVASTATIN 40 MG PO TABS
40.0000 mg | ORAL_TABLET | Freq: Every day | ORAL | Status: DC
  Administered 2011-12-12 – 2011-12-19 (×9): 40 mg via ORAL
  Filled 2011-12-11 (×10): qty 1

## 2011-12-11 MED ORDER — WARFARIN - PHARMACIST DOSING INPATIENT
Freq: Every day | Status: DC
  Administered 2011-12-18: 18:00:00

## 2011-12-11 MED ORDER — AMPICILLIN-SULBACTAM SODIUM 1.5 (1-0.5) G IJ SOLR
1.5000 g | Freq: Four times a day (QID) | INTRAMUSCULAR | Status: DC
  Administered 2011-12-12 – 2011-12-26 (×57): 1.5 g via INTRAVENOUS
  Filled 2011-12-11 (×60): qty 1.5

## 2011-12-11 MED ORDER — WARFARIN SODIUM 5 MG PO TABS
5.0000 mg | ORAL_TABLET | ORAL | Status: DC
  Filled 2011-12-11: qty 1

## 2011-12-11 MED ORDER — ALBUTEROL SULFATE HFA 108 (90 BASE) MCG/ACT IN AERS
2.0000 | INHALATION_SPRAY | RESPIRATORY_TRACT | Status: DC | PRN
  Administered 2011-12-18: 2 via RESPIRATORY_TRACT
  Filled 2011-12-11: qty 6.7

## 2011-12-11 MED ORDER — HYDROMORPHONE HCL PF 1 MG/ML IJ SOLN
0.5000 mg | INTRAMUSCULAR | Status: DC | PRN
  Administered 2011-12-11 – 2011-12-12 (×5): 1 mg via INTRAVENOUS
  Administered 2011-12-12: 0.5 mg via INTRAVENOUS
  Administered 2011-12-12 – 2011-12-17 (×22): 1 mg via INTRAVENOUS
  Administered 2011-12-17: 0.5 mg via INTRAVENOUS
  Administered 2011-12-17 – 2011-12-18 (×3): 1 mg via INTRAVENOUS
  Administered 2011-12-18: 0.5 mg via INTRAVENOUS
  Administered 2011-12-18 – 2011-12-23 (×28): 1 mg via INTRAVENOUS
  Filled 2011-12-11 (×63): qty 1

## 2011-12-11 MED ORDER — IPRATROPIUM-ALBUTEROL 20-100 MCG/ACT IN AERS
1.0000 | INHALATION_SPRAY | Freq: Four times a day (QID) | RESPIRATORY_TRACT | Status: DC
  Administered 2011-12-11 – 2011-12-13 (×6): 1 via RESPIRATORY_TRACT
  Filled 2011-12-11: qty 4

## 2011-12-11 MED ORDER — OXYCODONE HCL 5 MG PO TABS
5.0000 mg | ORAL_TABLET | ORAL | Status: DC | PRN
  Administered 2011-12-12 – 2011-12-27 (×11): 5 mg via ORAL
  Filled 2011-12-11 (×13): qty 1

## 2011-12-11 MED ORDER — IOHEXOL 300 MG/ML  SOLN
100.0000 mL | Freq: Once | INTRAMUSCULAR | Status: AC | PRN
  Administered 2011-12-11: 100 mL via INTRAVENOUS

## 2011-12-11 MED ORDER — ACETAMINOPHEN 650 MG RE SUPP: 650.0000 mg | Freq: Four times a day (QID) | RECTAL | Status: DC | PRN

## 2011-12-11 MED ORDER — IPRATROPIUM-ALBUTEROL 18-103 MCG/ACT IN AERO
2.0000 | INHALATION_SPRAY | Freq: Four times a day (QID) | RESPIRATORY_TRACT | Status: DC | PRN
  Filled 2011-12-11: qty 14.7

## 2011-12-11 MED ORDER — WARFARIN SODIUM 2.5 MG PO TABS
2.5000 mg | ORAL_TABLET | ORAL | Status: DC
  Administered 2011-12-12: 2.5 mg via ORAL
  Filled 2011-12-11: qty 1

## 2011-12-11 MED ORDER — ONDANSETRON HCL 4 MG PO TABS: 4.0000 mg | ORAL_TABLET | Freq: Four times a day (QID) | ORAL | Status: DC | PRN

## 2011-12-11 MED ORDER — SODIUM CHLORIDE 0.9 % IV SOLN: INTRAVENOUS | Status: DC

## 2011-12-11 MED ORDER — SODIUM CHLORIDE 0.9 % IV SOLN
INTRAVENOUS | Status: DC
  Administered 2011-12-12 – 2011-12-13 (×4): via INTRAVENOUS
  Administered 2011-12-16 – 2011-12-18 (×3): 1000 mL via INTRAVENOUS
  Administered 2011-12-19: 20:00:00 via INTRAVENOUS

## 2011-12-11 MED ORDER — MORPHINE SULFATE 4 MG/ML IJ SOLN
4.0000 mg | Freq: Once | INTRAMUSCULAR | Status: AC
  Administered 2011-12-11: 4 mg via INTRAVENOUS
  Filled 2011-12-11: qty 1

## 2011-12-11 MED ORDER — IPRATROPIUM-ALBUTEROL 20-100 MCG/ACT IN AERS
1.0000 | INHALATION_SPRAY | Freq: Four times a day (QID) | RESPIRATORY_TRACT | Status: DC | PRN
  Administered 2011-12-18: 1 via RESPIRATORY_TRACT
  Filled 2011-12-11: qty 4

## 2011-12-11 MED ORDER — ZOLPIDEM TARTRATE 5 MG PO TABS
5.0000 mg | ORAL_TABLET | Freq: Every evening | ORAL | Status: DC | PRN
  Administered 2011-12-22: 5 mg via ORAL
  Filled 2011-12-11: qty 1

## 2011-12-11 MED ORDER — ALUM & MAG HYDROXIDE-SIMETH 200-200-20 MG/5ML PO SUSP
30.0000 mL | Freq: Four times a day (QID) | ORAL | Status: DC | PRN
  Administered 2011-12-14 – 2011-12-27 (×3): 30 mL via ORAL
  Filled 2011-12-11 (×3): qty 30

## 2011-12-11 MED ORDER — DIGOXIN 125 MCG PO TABS
125.0000 ug | ORAL_TABLET | Freq: Every day | ORAL | Status: DC
  Administered 2011-12-12 – 2011-12-28 (×17): 125 ug via ORAL
  Filled 2011-12-11 (×17): qty 1

## 2011-12-11 MED ORDER — ASPIRIN EC 81 MG PO TBEC
81.0000 mg | DELAYED_RELEASE_TABLET | Freq: Every day | ORAL | Status: DC
  Administered 2011-12-12 – 2011-12-21 (×10): 81 mg via ORAL
  Filled 2011-12-11 (×10): qty 1

## 2011-12-11 MED ORDER — DEXTROSE 5 % IV SOLN
1.0000 g | Freq: Once | INTRAVENOUS | Status: AC
  Administered 2011-12-11: 1 g via INTRAVENOUS
  Filled 2011-12-11: qty 10

## 2011-12-11 MED ORDER — ONDANSETRON HCL 4 MG/2ML IJ SOLN
4.0000 mg | INTRAMUSCULAR | Status: DC | PRN
  Administered 2011-12-11 (×2): 4 mg via INTRAVENOUS
  Filled 2011-12-11 (×2): qty 2

## 2011-12-11 MED ORDER — WARFARIN SODIUM 2.5 MG PO TABS
2.5000 mg | ORAL_TABLET | ORAL | Status: DC
  Filled 2011-12-11: qty 1

## 2011-12-11 MED ORDER — ACETAMINOPHEN 325 MG PO TABS: 650.0000 mg | ORAL_TABLET | Freq: Four times a day (QID) | ORAL | Status: DC | PRN

## 2011-12-11 MED ORDER — METFORMIN HCL 500 MG PO TABS
500.0000 mg | ORAL_TABLET | Freq: Every day | ORAL | Status: DC
  Administered 2011-12-12 – 2011-12-22 (×12): 500 mg via ORAL
  Filled 2011-12-11 (×13): qty 1

## 2011-12-11 MED ORDER — OMEGA-3-ACID ETHYL ESTERS 1 G PO CAPS
1.0000 g | ORAL_CAPSULE | Freq: Every day | ORAL | Status: DC
  Administered 2011-12-12 – 2011-12-21 (×3): 1 g via ORAL
  Filled 2011-12-11 (×17): qty 1

## 2011-12-11 NOTE — ED Notes (Signed)
ZOX:[id number]<AV> Expected date:12/11/11<BR> Expected time:12:03 PM<BR> Means of arrival:Ambulance<BR> Comments:<BR> Difficulty urinating

## 2011-12-11 NOTE — Progress Notes (Signed)
ANTICOAGULATION CONSULT NOTE - Initial Consult  Pharmacy Consult for warfarin Indication: VTE treatment  No Known Allergies  Patient Measurements: Height: 5\' 11"  (180.3 cm) Weight: 201 lb 9.6 oz (91.445 kg) IBW/kg (Calculated) : 75.3  Heparin Dosing Weight:   Vital Signs: Temp: 99.2 F (37.3 C) (08/09 2124) Temp src: Oral (08/09 2124) BP: 135/67 mmHg (08/09 2124) Pulse Rate: 89  (08/09 2124)  Labs:  Basename 12/11/11 1423  HGB 11.1*  HCT 31.8*  PLT 157  APTT --  LABPROT 28.6*  INR 2.64*  HEPARINUNFRC --  CREATININE 1.08  CKTOTAL --  CKMB --  TROPONINI --    Estimated Creatinine Clearance: 67.2 ml/min (by C-G formula based on Cr of 1.08).   Medical History: Past Medical History  Diagnosis Date  . Hypertension   . Arthritis   . Heart disease   . SOB (shortness of breath)   . Malignant neoplasm of hepatic flexure   . Malignant neoplasm of rectosigmoid junction   . Cancer     and/or colon polyp  . Malignant neoplasm of hepatic flexure   . Malignant neoplasm of rectosigmoid junction   . Diabetes mellitus     Medications:  Prescriptions prior to admission  Medication Sig Dispense Refill  . albuterol (PROAIR HFA) 108 (90 BASE) MCG/ACT inhaler Inhale 2 puffs into the lungs every 4 (four) hours as needed. WHEEZING      . albuterol-ipratropium (COMBIVENT) 18-103 MCG/ACT inhaler Inhale 1 puff into the lungs 4 (four) times daily. WHEEZING      . aspirin 81 MG tablet Take 81 mg by mouth daily.        . digoxin (LANOXIN) 0.125 MG tablet Take 125 mcg by mouth daily.        . fish oil-omega-3 fatty acids 1000 MG capsule Take 1 g by mouth daily.        . Fluticasone-Salmeterol (ADVAIR DISKUS) 500-50 MCG/DOSE AEPB Inhale 1 puff into the lungs every 12 (twelve) hours.      . metFORMIN (GLUCOPHAGE) 500 MG tablet Take 500 mg by mouth at bedtime.      . simvastatin (ZOCOR) 40 MG tablet Take 40 mg by mouth at bedtime.        Marland Kitchen warfarin (COUMADIN) 5 MG tablet Take 2.5-5 mg  by mouth daily. Pt takes 5 mg every day except on Monday and Friday pt takes 2.5mg  ( 1/2 tab of 5 mg tablet)        Assessment: Patient with chronic warfarin use.  INR at goal.  Goal of Therapy:  INR 2-3 Monitor platelets by anticoagulation protocol: Yes   Plan:  Continue home dose.  Daily INR.  Aleene Davidson Crowford 12/11/2011,11:09 PM

## 2011-12-11 NOTE — ED Provider Notes (Signed)
History     CSN: [id number]  Arrival date & time 12/11/11  1223   First MD Initiated Contact with Patient 12/11/11 1358      Chief Complaint  Patient presents with  . Abdominal Pain   HPI 78 yo male with h/o rectal cancer 2 years ago s/p colectomy, chemo-radiation who presents with suprapubic abdominal pain that started last Tuesday and has been getting worst since then. Rates pain as a 10/10, constant dull pain with sharp shooting intermittent pain and cramping. Pain is severe to the point of not being able to walk. Denies any nausea or vomiting. Output from ostomy has been unchanged in color, consistency and frequency. He did note some hematuria 2 days ago with clots. Dysuria present as well. Reports Difficulty completely voiding is present but is chronic and unchanged from baseline. Also reports noticing blood clots from rectum 4 days ago. No blood from ostomy output however.  Denies any chills, or fevers.   Oncologist: Dr. Clelia Croft PCP: Dr. Everlene Other  Past Medical History  Diagnosis Date  . Hypertension   . Arthritis   . Heart disease   . SOB (shortness of breath)   . Malignant neoplasm of hepatic flexure   . Malignant neoplasm of rectosigmoid junction   . Cancer     and/or colon polyp  . Malignant neoplasm of hepatic flexure   . Malignant neoplasm of rectosigmoid junction   . Diabetes mellitus     Past Surgical History  Procedure Date  . Back surgery [phone]  . Arthroscopic knee 1992  . Adenocarcinoma of the rectum [id number]  . Exploratory laparotomy 02/2010  . Rectosigmoid colon resection 02/2010  . Colostomy 02/2010  . Drainage of intra-abdominal abscess 02/2010    History reviewed. No pertinent family history.  History  Substance Use Topics  . Smoking status: Never Smoker   . Smokeless tobacco: Never Used  . Alcohol Use: No      Review of Systems  All other systems reviewed and are negative.    Allergies  Review of patient's allergies indicates no known  allergies.  Home Medications   Current Outpatient Rx  Name Route Sig Dispense Refill  . ALBUTEROL SULFATE HFA 108 (90 BASE) MCG/ACT IN AERS Inhalation Inhale 2 puffs into the lungs every 4 (four) hours as needed. WHEEZING    . IPRATROPIUM-ALBUTEROL 18-103 MCG/ACT IN AERO Inhalation Inhale 1 puff into the lungs 4 (four) times daily. WHEEZING    . ASPIRIN 81 MG PO TABS Oral Take 81 mg by mouth daily.      Marland Kitchen DIGOXIN 0.125 MG PO TABS Oral Take 125 mcg by mouth daily.      . OMEGA-3 FATTY ACIDS 1000 MG PO CAPS Oral Take 1 g by mouth daily.      Marland Kitchen FLUTICASONE-SALMETEROL 500-50 MCG/DOSE IN AEPB Inhalation Inhale 1 puff into the lungs every 12 (twelve) hours.    Marland Kitchen METFORMIN HCL 500 MG PO TABS Oral Take 500 mg by mouth at bedtime.    Marland Kitchen SIMVASTATIN 40 MG PO TABS Oral Take 40 mg by mouth at bedtime.      . WARFARIN SODIUM 5 MG PO TABS Oral Take 2.5-5 mg by mouth daily. Pt takes 5 mg every day except on Monday and Friday pt takes 2.5mg  ( 1/2 tab of 5 mg tablet)      BP 104/48  Pulse 83  Temp 98.6 F (37 C) (Oral)  Resp 20  SpO2 96%  Physical Exam  Vitals reviewed. Constitutional: He  is oriented to person, place, and time.  Cardiovascular: Normal rate, regular rhythm and normal heart sounds.   Pulmonary/Chest:       Labored breathing, scattered breath sounds throughout lung fields.   Abdominal: Soft.       Hypoactive bowel sounds Tender along mid lower and left lower quadrant around ostomy site. No erythema or warmth. Dark green liquid present in ostomy.  Negative CVA.  Suprapubic distention  Neurological: He is alert and oriented to person, place, and time. No cranial nerve deficit.  Skin: Skin is warm and dry.    ED Course  Procedures (including critical care time)  Labs Reviewed  CBC WITH DIFFERENTIAL - Abnormal; Notable for the following:    RBC 3.40 (*)     Hemoglobin 11.1 (*)     HCT 31.8 (*)     Neutrophils Relative 82 (*)     Neutro Abs 8.4 (*)     Lymphocytes Relative 7  (*)     Monocytes Absolute 1.1 (*)     All other components within normal limits  COMPREHENSIVE METABOLIC PANEL - Abnormal; Notable for the following:    Sodium 132 (*)     Glucose, Bld 124 (*)     Albumin 2.8 (*)     GFR calc non Af Amer 65 (*)     GFR calc Af Amer 75 (*)     All other components within normal limits  URINALYSIS, ROUTINE W REFLEX MICROSCOPIC - Abnormal; Notable for the following:    Color, Urine AMBER (*)  BIOCHEMICALS MAY BE AFFECTED BY COLOR   Bilirubin Urine SMALL (*)     Protein, ur 30 (*)     Leukocytes, UA SMALL (*)     All other components within normal limits  URINE MICROSCOPIC-ADD ON - Abnormal; Notable for the following:    Squamous Epithelial / LPF FEW (*)     Bacteria, UA FEW (*)     All other components within normal limits  PROTIME-INR   Dg Chest 2 View  12/11/2011  *RADIOLOGY REPORT*  Clinical Data: Shortness of breath.  Generalized weakness. Abdominal pain.  History of colorectal cancer.  CHEST - 2 VIEW  Comparison: CT chest 04/09/2011, 10/20/2010, and portable chest x- ray 06/04/2010.  Findings: Cardiac silhouette normal in size, unchanged.  Thoracic aorta mildly atherosclerotic, unchanged.  Hilar and mediastinal contours otherwise unremarkable.  Lungs mildly hyperinflated but clear.  Bronchovascular markings normal.  No pleural effusions. Degenerative changes involving the thoracic spine.  Left subclavian Port-A-Cath tip in the mid SVC.  Degenerative changes involving the right shoulder.  IMPRESSION: Mild hyperinflation consistent with COPD and/or asthma.  No acute cardiopulmonary disease.  Stable examination.  Original Report Authenticated By: Arnell Sieving, M.D.     No diagnosis found.    MDM   With suprapubic distention, obtained bladder scan which showed 500cc. Foley placed with drainage of ~ 350cc of urine. Patient still having abdominal pain after foley placed. In the context of history of colon cancer, will obtain CT abdomen and pelvis  to rule out any obvious metastatic lesion.  Urine positive for WBC's. Urine culture sent. Ceftriaxone 1gm x1 given.  If abdominal CT normal, will discharge patient home with UTI treatment and foley with follow up with urology.    Marena Chancy, PGY-2 [facility]         Lonia Skinner, MD 12/11/11 [phone]

## 2011-12-11 NOTE — ED Notes (Signed)
States has been weak in past two days , but feels stronger now than yesterday

## 2011-12-11 NOTE — ED Notes (Signed)
Started two weeks ago, worse past two nights, abd pain with hematuria. To ED via [facility] medic 100 from [employer] [facility].  Hx colon Ca with colostomy, port-a-cath left chest area-- not sure when was flushed last

## 2011-12-11 NOTE — H&P (Signed)
[facility] History and Physical  Vincent [name] YNW:[id number] DOB: 06-24-1932 DOA: 12/11/2011  Referring physician:  PCP: Aura Dials, MD   Chief Complaint: ABD Pain  HPI: Vincent [name] is an 78 y.o. Male with a history of Rectal Cancer diagnosed in 02/2010 S/P surgical resection and colostomy along with Chemotherapy and Radiation treatments who presents with 2 weeks of worsening diffuse ABD Pain along with generalized weakness.  He denies weight loss, fevers chills and nausea and vomiting.  He was evaluated in the ED and found to found to have recurrence of cancer on Ct scan of the ABD and Pelvis.  He was referred for admission. Dr. Gerrit Friends (General Surgery) was consulted.      Review of Systems:  The patient denies anorexia, fever, weight loss, vision loss, decreased hearing, hoarseness, chest pain, syncope, dyspnea on exertion, peripheral edema, balance deficits, hemoptysis, melena, hematochezia, severe indigestion/heartburn, hematuria, incontinence, genital sores, suspicious skin lesions, transient blindness, difficulty walking, depression, unusual weight change, abnormal bleeding, enlarged lymph nodes, angioedema, and breast masses.    Past Medical History  Diagnosis Date  . Hypertension   . Arthritis   . Heart disease   . SOB (shortness of breath)   . Malignant neoplasm of hepatic flexure   . Malignant neoplasm of rectosigmoid junction   . Cancer     and/or colon polyp  . Malignant neoplasm of hepatic flexure   . Malignant neoplasm of rectosigmoid junction   . Diabetes mellitus    Past Surgical History  Procedure Date  . Back surgery [id number]  . Arthroscopic knee 1992  . Adenocarcinoma of the rectum [id number]  . Exploratory laparotomy 02/2010  . Rectosigmoid colon resection 02/2010  . Colostomy 02/2010  . Drainage of intra-abdominal abscess 02/2010     Prior to Admission medications   Medication Sig Start Date End Date Taking? Authorizing Provider  albuterol  (PROAIR HFA) 108 (90 BASE) MCG/ACT inhaler Inhale 2 puffs into the lungs every 4 (four) hours as needed. WHEEZING   Yes Historical Provider, MD  albuterol-ipratropium (COMBIVENT) 18-103 MCG/ACT inhaler Inhale 1 puff into the lungs 4 (four) times daily. WHEEZING   Yes Historical Provider, MD  aspirin 81 MG tablet Take 81 mg by mouth daily.     Yes Historical Provider, MD  digoxin (LANOXIN) 0.125 MG tablet Take 125 mcg by mouth daily.     Yes Historical Provider, MD  fish oil-omega-3 fatty acids 1000 MG capsule Take 1 g by mouth daily.     Yes Historical Provider, MD  Fluticasone-Salmeterol (ADVAIR DISKUS) 500-50 MCG/DOSE AEPB Inhale 1 puff into the lungs every 12 (twelve) hours.   Yes Historical Provider, MD  metFORMIN (GLUCOPHAGE) 500 MG tablet Take 500 mg by mouth at bedtime.   Yes Historical Provider, MD  simvastatin (ZOCOR) 40 MG tablet Take 40 mg by mouth at bedtime.     Yes Historical Provider, MD  warfarin (COUMADIN) 5 MG tablet Take 2.5-5 mg by mouth daily. Pt takes 5 mg every day except on Monday and Friday pt takes 2.5mg  ( 1/2 tab of 5 mg tablet)   Yes Historical Provider, MD    Allergies:  No Known Allergies   Social History:  reports that he has never smoked. He has never used smokeless tobacco. He reports that he does not drink alcohol or use illicit drugs.    History reviewed. No pertinent family history.     Physical Exam:  GEN:  Pleasant  78 year old well nourished and well  developed Caucasian male examined and in discomfort but no acute distress; cooperative with exam Filed Vitals:   12/11/11 1238 12/11/11 1544 12/11/11 1910  BP: 104/48 133/71 122/54  Pulse: 83 80 78  Temp: 98.6 F (37 C)    TempSrc: Oral    Resp: 20 22 16   SpO2: 96% 95% 95%   Blood pressure 122/54, pulse 78, temperature 98.6 F (37 C), temperature source Oral, resp. rate 16, SpO2 95.00%. PSYCH: SHe is alert and oriented x4; does not appear anxious does not appear depressed; affect is  normal HEENT: Normocephalic and Atraumatic, Mucous membranes pink; PERRLA; EOM intact; Fundi:  Benign;  No scleral icterus, Nares: Patent, Oropharynx: Clear, Edentulous with Full Dentures, Neck:  FROM, no cervical lymphadenopathy nor thyromegaly or carotid bruit; no JVD; Breasts:: Not examined CHEST WALL: No tenderness CHEST: Normal respiration, clear to auscultation bilaterally HEART: Regular rate and rhythm; no murmurs rubs or gallops BACK: No kyphosis or scoliosis; no CVA tenderness ABDOMEN: Positive Bowel Sounds, soft non-tender; no masses, no organomegaly.   Rectal Exam: Not done EXTREMITIES: No bone or joint deformity; age-appropriate arthropathy of the hands and knees; no cyanosis, clubbing or edema; no ulcerations. Genitalia: not examined PULSES: 2+ and symmetric SKIN: Normal hydration no rash or ulceration CNS: Cranial nerves 2-12 grossly intact no focal neurologic deficit     Labs on Admission:  Basic Metabolic Panel:  Lab 12/11/11 1610  NA 132*  K 3.7  CL 96  CO2 27  GLUCOSE 124*  BUN 21  CREATININE 1.08  CALCIUM 9.0  MG --  PHOS --   Liver Function Tests:  Lab 12/11/11 1423  AST 25  ALT 22  ALKPHOS 74  BILITOT 0.7  PROT 6.7  ALBUMIN 2.8*   No results found for this basename: LIPASE:5,AMYLASE:5 in the last 168 hours No results found for this basename: AMMONIA:5 in the last 168 hours CBC:  Lab 12/11/11 1423  WBC 10.2  NEUTROABS 8.4*  HGB 11.1*  HCT 31.8*  MCV 93.5  PLT 157   Cardiac Enzymes: No results found for this basename: CKTOTAL:5,CKMB:5,CKMBINDEX:5,TROPONINI:5 in the last 168 hours   Radiological Exams on Admission: Dg Chest 2 View  12/11/2011  *RADIOLOGY REPORT*  Clinical Data: Shortness of breath.  Generalized weakness. Abdominal pain.  History of colorectal cancer.  CHEST - 2 VIEW  Comparison: CT chest 04/09/2011, 10/20/2010, and portable chest x- ray 06/04/2010.  Findings: Cardiac silhouette normal in size, unchanged.  Thoracic aorta  mildly atherosclerotic, unchanged.  Hilar and mediastinal contours otherwise unremarkable.  Lungs mildly hyperinflated but clear.  Bronchovascular markings normal.  No pleural effusions. Degenerative changes involving the thoracic spine.  Left subclavian Port-A-Cath tip in the mid SVC.  Degenerative changes involving the right shoulder.  IMPRESSION: Mild hyperinflation consistent with COPD and/or asthma.  No acute cardiopulmonary disease.  Stable examination.  Original Report Authenticated By: Arnell Sieving, M.D.   Ct Abdomen Pelvis W Contrast  12/11/2011  *RADIOLOGY REPORT*  Clinical Data: Abdominal pain, hematuria, history of colon cancer status post resection in 2011  CT ABDOMEN AND PELVIS WITH CONTRAST  Technique:  Multidetector CT imaging of the abdomen and pelvis was performed following the standard protocol during bolus administration of intravenous contrast.  Contrast: OMNIPAQUE IOHEXOL 300 MG/ML  SOLN  Comparison: 04/09/2011  Findings: Lung bases are clear.  Multiple probable hepatic cysts, unchanged.  Spleen, pancreas, and adrenal glands within normal limits.  Gallbladder is unremarkable.  No intrahepatic or extrahepatic ductal dilatation.  1.4 cm posterior  interpolar left renal cyst (series 2/image 27). Right kidney is unremarkable.  No hydronephrosis.  No evidence of bowel obstruction.  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  Normal appendix.  Left lower quadrant colostomy.  Moderate left parastomal hernia containing fat and a loop of nondilated small bowel (series 2/image 39).  Again seen is rectal wall thickening, likely mildly progressed (series 2/image 70), suspicious for recurrent tumor.  Adjacent 3.0 x 2.0 cm right perirectal node (series 2/image 61), previously 8 mm, compatible with nodal metastasis.  3.3 x 2.4 cm fluid collection arising from the left rectal wall (series 2/image 66).   Adjacent/contiguous 3.5 x 2.0 cm fluid collection in the left pelvis (series 2/image 60), with associated thin enhancing rim, worrisome for possible abscess.  Bilobed infrarenal abdominal aortic aneurysm measuring 3.4 and 3.7 cm in AP diameter.  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 Indwelling Foley catheter.  Prostate is unremarkable.  Direct extension of tumor into the right seminal vesicle is difficult to exclude (series 4/image 22).  Degenerative changes of the visualized thoracolumbar spine.  IMPRESSION: Recurrent rectal tumor with associated 3.0 x 2.0 cm right perirectal nodal metastasis.  Additional left pelvic/perirectal fluid collections, as described above, worrisome for possible small abscess.  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  Left lower quadrant colostomy.  Parastomal hernia containing fat and a loop of nondilated small bowel.  No evidence of bowel obstruction.  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 Indwelling Foley catheter.  Original Report Authenticated By: Charline Bills, M.D.    Assessment: Principal Problem:  *Abdominal pain Active Problems:  Rectal carcinoma  Anemia  Thrombocytopenia  Hyponatremia  Warfarin-induced coagulopathy  Weakness generalized   PLAN:    Admit to med/Surg Bed General Surgery: Dr. Dorothey Baseman, and to see Needs Oncology Consultation Pain Control Anemia Panel Reconcile Medications Pharmacy management of Coumadin.     Code Status:  FULL CODE Family Communication: N/A Disposition Plan: RETURN HOME  Time spent:  Ron Parker [facility] Pager [phone]  If 7PM-7AM, please contact night-coverage [email or web address] Password [email or web address] 12/11/2011, 8:30 PM

## 2011-12-11 NOTE — ED Notes (Signed)
Paged to provide support to pt's son Roe Coombs).  Provided brief anticipatory grief support around pt's illness.   Pt's son spoke with chaplain extensively about tension within marriage.  Provided emotional and spiritual support, grief support around losses in marriage and recommended counselor or LMFT.    Will continue to follow as pt is at [facility].

## 2011-12-11 NOTE — ED Notes (Signed)
Talking about family, wife and kids-telling stories from past. States probably will have to stay because wife doesn't drive and wont drive in the dark.

## 2011-12-11 NOTE — ED Provider Notes (Addendum)
Assumed care from Dr. Patria Mane.  Reviewed.  The chart and reexamined.  The patient.  He still in pain.  CT shows evidence of recurrent cancer, and possible intra-abdominal abscess.  We'll consult surgery for admission.  I spoke with Dr. Gerrit Friends.  He says not surgical candidate. He agrees with admission and will consult.  Cheri Guppy, MD 12/11/11 1906  Spoke with dr. Lovell Sheehan.  She will admit.  Cheri Guppy, MD 12/11/11 [phone]

## 2011-12-12 ENCOUNTER — Other Ambulatory Visit: Payer: Self-pay | Admitting: Oncology

## 2011-12-12 ENCOUNTER — Telehealth: Payer: Self-pay | Admitting: Oncology

## 2011-12-12 DIAGNOSIS — C20 Malignant neoplasm of rectum: Secondary | ICD-10-CM

## 2011-12-12 DIAGNOSIS — R109 Unspecified abdominal pain: Secondary | ICD-10-CM

## 2011-12-12 DIAGNOSIS — C19 Malignant neoplasm of rectosigmoid junction: Secondary | ICD-10-CM

## 2011-12-12 LAB — BASIC METABOLIC PANEL
CO2: 25 mEq/L (ref 19–32)
Chloride: 98 mEq/L (ref 96–112)
GFR calc non Af Amer: 68 mL/min — ABNORMAL LOW (ref >90)
Glucose, Bld: 148 mg/dL — ABNORMAL HIGH (ref 70–99)
Potassium: 4 mEq/L (ref 3.5–5.1)
Sodium: 130 mEq/L — ABNORMAL LOW (ref 135–145)

## 2011-12-12 LAB — CBC
Hemoglobin: 10.8 g/dL — ABNORMAL LOW (ref 13.0–17.0)
MCH: 32 pg (ref 26.0–34.0)
MCV: 95.5 fL (ref 78.0–100.0)
RBC: 3.37 MIL/uL — ABNORMAL LOW (ref 4.22–5.81)

## 2011-12-12 LAB — IRON AND TIBC
Saturation Ratios: 8 % — ABNORMAL LOW (ref 20–55)
TIBC: 221 ug/dL (ref 215–435)

## 2011-12-12 LAB — PROTIME-INR: INR: 3.11 — ABNORMAL HIGH (ref 0.00–1.49)

## 2011-12-12 LAB — VITAMIN B12: Vitamin B-12: 206 pg/mL — ABNORMAL LOW (ref 211–911)

## 2011-12-12 LAB — RETICULOCYTES: Retic Ct Pct: 1.2 % (ref 0.4–3.1)

## 2011-12-12 MED ORDER — CHLORHEXIDINE GLUCONATE 0.12 % MT SOLN
15.0000 mL | Freq: Two times a day (BID) | OROMUCOSAL | Status: DC
  Administered 2011-12-13 – 2011-12-28 (×19): 15 mL via OROMUCOSAL
  Filled 2011-12-12 (×33): qty 15

## 2011-12-12 MED ORDER — BIOTENE DRY MOUTH MT LIQD
15.0000 mL | Freq: Two times a day (BID) | OROMUCOSAL | Status: DC
  Administered 2011-12-13 – 2011-12-28 (×27): 15 mL via OROMUCOSAL

## 2011-12-12 NOTE — Progress Notes (Signed)
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) -pain management, IVF - supportive care and follow  Active Problems:  Rectal carcinoma with recurrence -follow up on CEA, surgery suspecting developing carcinomatosis -onc consulted, dicussed pt with Dr Darrold Span and she recommends holding off PET while inpt -pt was scheduled for CT abd and chest on 8/13 per Dr Clelia Croft, CT abd done and results as above, will order CT chest - Dr Clelia Croft to follow Anemia  -hgb stable, follow consult consult gi if further rectal bleeding reported/ decrease hgb H/o PAF -On chronic coumadin, INR supratherapeutic and coumadin on hold today, pharmacy following ?intraabdominal abscess  -per CT, on empiric ABX - per surgery- little to offer from surgical standpoint, follow and discuss need for further abx with sugery Thrombocytopenia  -plt count stable, wnl at 184 today 8/10 Hyponatremia  -vol depletion vs SIADH -Follow recheck on ivf , if not improving obtain ivf follow and further eval. Warfarin-induced coagulopathy  -hold coumadin as above, follow and recheck Weakness generalized -likely secondary to above Code Status: full Family Communication: wife via [phone]) Disposition Plan: to home when medically stable   Brief narrative: The pt is [name] with a history of Rectal Cancer diagnosed in 02/2010 S/P surgical resection and colostomy along with Chemotherapy and Radiation treatments who presents with 2 weeks of worsening diffuse ABD Pain along with generalized weakness.  He was evaluated in the ED and found to found to have recurrence of cancer on Ct scan of the ABD and Pelvis. He was   Admitted for further evaluation and management.  General Surgery/Dr Gerrit Friends was consulted   Consultants:  surgery  Procedures:  none  Antibiotics:  unasyn  HPI/Subjective: Pt sleepy but easily aroused,states abd pain about the same, denies n/v. No rectal bleeding reported  Objective: Filed Vitals:   12/12/11 1028 12/12/11 1400 12/12/11 1713 12/12/11 1803  BP: 113/65 117/68  115/71  Pulse: 75 90  90  Temp: 98.8 F (37.1 C) 98 F (36.7 C)  98.9 F (37.2 C)  TempSrc: Oral Oral  Oral  Resp: 18 18  18   Height:      Weight:      SpO2: 96% 97% 97% 98%    Intake/Output Summary (Last 24 hours) at 12/12/11 1824 Last data filed at 12/12/11 0900  Gross per 24 hour  Intake  597.5 ml  Output   1250 ml  Net -652.5 ml   Filed Weights   12/11/11 2124  Weight: 91.445 kg (201 lb 9.6 oz)    Exam:   General:  Somnolent but easily aroused, in no apparent distress  Cardiovascular: RRR, nl S1s2  Respiratory:decreased Bs at bases  Abdomen: soft, +Bs, tenderness present in lower abd.+costomy with brown stool on L.abd  EXT: no cyanosis, no edema  Data Reviewed: Basic Metabolic Panel:  Lab 12/12/11 4696 12/11/11 1423  NA 130* 132*  K 4.0 3.7  CL 98 96  CO2 25 27  GLUCOSE 148* 124*  BUN 20 21  CREATININE 1.03 1.08  CALCIUM 8.4 9.0  MG -- --  PHOS -- --   Liver Function Tests:  Lab 12/11/11 1423  AST 25  ALT 22  ALKPHOS 74  BILITOT 0.7  PROT 6.7  ALBUMIN 2.8*   No results found  for this basename: LIPASE:5,AMYLASE:5 in the last 168 hours No results found for this basename: AMMONIA:5 in the last 168 hours CBC:  Lab 12/12/11 0408 12/11/11 1423  WBC 11.8* 10.2  NEUTROABS -- 8.4*  HGB 10.8* 11.1*  HCT 32.2* 31.8*  MCV 95.5 93.5  PLT 184 157   Cardiac Enzymes: No results found for this basename: CKTOTAL:5,CKMB:5,CKMBINDEX:5,TROPONINI:5 in the last 168 hours BNP (last 3 results) No results found for this basename: PROBNP:3 in the last 8760 hours CBG: No  results found for this basename: GLUCAP:5 in the last 168 hours  No results found for this or any previous visit (from the past 240 hour(s)).   Studies: Dg Chest 2 View  12/11/2011  *RADIOLOGY REPORT*  Clinical Data: Shortness of breath.  Generalized weakness. Abdominal pain.  History of colorectal cancer.  CHEST - 2 VIEW  Comparison: CT chest 04/09/2011, 10/20/2010, and portable chest x- ray 06/04/2010.  Findings: Cardiac silhouette normal in size, unchanged.  Thoracic aorta mildly atherosclerotic, unchanged.  Hilar and mediastinal contours otherwise unremarkable.  Lungs mildly hyperinflated but clear.  Bronchovascular markings normal.  No pleural effusions. Degenerative changes involving the thoracic spine.  Left subclavian Port-A-Cath tip in the mid SVC.  Degenerative changes involving the right shoulder.  IMPRESSION: Mild hyperinflation consistent with COPD and/or asthma.  No acute cardiopulmonary disease.  Stable examination.  Original Report Authenticated By: Arnell Sieving, M.D.   Ct Abdomen Pelvis W Contrast  12/11/2011  *RADIOLOGY REPORT*  Clinical Data: Abdominal pain, hematuria, history of colon cancer status post resection in 2011  CT ABDOMEN AND PELVIS WITH CONTRAST  Technique:  Multidetector CT imaging of the abdomen and pelvis was performed following the standard protocol during bolus administration of intravenous contrast.  Contrast: OMNIPAQUE IOHEXOL 300 MG/ML  SOLN  Comparison: 04/09/2011  Findings: Lung bases are clear.  Multiple probable hepatic cysts, unchanged.  Spleen, pancreas, and adrenal glands within normal limits.  Gallbladder is unremarkable.  No intrahepatic or extrahepatic ductal dilatation.  1.4 cm posterior interpolar left renal cyst (series 2/image 27). Right kidney is unremarkable.  No hydronephrosis.  No evidence of bowel obstruction.  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   Normal appendix.  Left lower quadrant colostomy.  Moderate left parastomal hernia containing fat and a loop of nondilated small bowel (series 2/image 39).  Again seen is rectal wall thickening, likely mildly progressed (series 2/image 70), suspicious for recurrent tumor.  Adjacent 3.0 x 2.0 cm right perirectal node (series 2/image 61), previously 8 mm, compatible with nodal metastasis.  3.3 x 2.4 cm fluid collection arising from the left rectal wall (series 2/image 66).  Adjacent/contiguous 3.5 x 2.0 cm fluid collection in the left pelvis (series 2/image 60), with associated thin enhancing rim, worrisome for possible abscess.  Bilobed infrarenal abdominal aortic aneurysm measuring 3.4 and 3.7 cm in AP diameter.  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 Indwelling Foley catheter.  Prostate is unremarkable.  Direct extension of tumor into the right seminal vesicle is difficult to exclude (series 4/image 22).  Degenerative changes of the visualized thoracolumbar spine.  IMPRESSION: Recurrent rectal tumor with associated 3.0 x 2.0 cm right perirectal nodal metastasis.  Additional left pelvic/perirectal fluid collections, as described above, worrisome for possible small abscess.  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  Left lower quadrant colostomy.  Parastomal hernia containing fat and a loop of  nondilated small bowel.  No evidence of bowel obstruction.  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 Indwelling Foley catheter.  Original Report Authenticated By: Charline Bills, M.D.    Scheduled Meds:   . ampicillin-sulbactam (UNASYN) IV  1.5 g Intravenous Q6H  . aspirin EC  81 mg Oral Daily  . cefTRIAXone (ROCEPHIN)  IV  1 g Intravenous Once  . digoxin  125 mcg Oral Daily  . Fluticasone-Salmeterol  1 puff Inhalation Q12H  . Ipratropium-Albuterol  1 puff Inhalation QID  . metFORMIN  500 mg Oral QHS  .   morphine injection  4 mg Intravenous Once  . omega-3 acid ethyl esters  1 g Oral Daily  . simvastatin  40 mg Oral QHS  . Warfarin - Pharmacist Dosing Inpatient   Does not apply q1800  . DISCONTD: sodium chloride   Intravenous STAT  . DISCONTD: albuterol-ipratropium  1 puff Inhalation QID  . DISCONTD: warfarin  2.5 mg Oral Custom  . DISCONTD: warfarin  2.5 mg Oral Custom  . DISCONTD: warfarin  5 mg Oral Custom   Continuous Infusions:   . sodium chloride 75 mL/hr at 12/12/11 1530    Principal Problem:  *Abdominal pain Active Problems:  Rectal carcinoma  Anemia  Thrombocytopenia  Hyponatremia  Warfarin-induced coagulopathy  Weakness generalized    Time spent:    [facility]  [facility] Pager [phone]. If 8PM-8AM, please contact night-coverage at [email or web address], password [facility] 12/12/2011, 6:24 PM  LOS: 1 day

## 2011-12-12 NOTE — Progress Notes (Signed)
ANTICOAGULATION CONSULT NOTE  Pharmacy Consult for warfarin Indication: VTE treatment  No Known Allergies  Patient Measurements: Height: 5\' 11"  (180.3 cm) Weight: 201 lb 9.6 oz (91.445 kg) IBW/kg (Calculated) : 75.3    Vital Signs: Temp: 98.8 F (37.1 C) (08/10 1028) Temp src: Oral (08/10 1028) BP: 113/65 mmHg (08/10 1028) Pulse Rate: 75  (08/10 1028)  Labs:  Basename 12/12/11 0408 12/11/11 1423  HGB 10.8* 11.1*  HCT 32.2* 31.8*  PLT 184 157  APTT -- --  LABPROT 32.5* 28.6*  INR 3.11* 2.64*  HEPARINUNFRC -- --  CREATININE 1.03 1.08  CKTOTAL -- --  CKMB -- --  TROPONINI -- --    Estimated Creatinine Clearance: 70.5 ml/min (by C-G formula based on Cr of 1.03).    Medications:     . ampicillin-sulbactam (UNASYN) IV  1.5 g Intravenous Q6H  . aspirin EC  81 mg Oral Daily  . cefTRIAXone (ROCEPHIN)  IV  1 g Intravenous Once  . digoxin  125 mcg Oral Daily  . Fluticasone-Salmeterol  1 puff Inhalation Q12H  . Ipratropium-Albuterol  1 puff Inhalation QID  . metFORMIN  500 mg Oral QHS  .  morphine injection  4 mg Intravenous Once  .  morphine injection  4 mg Intravenous Once  . omega-3 acid ethyl esters  1 g Oral Daily  . simvastatin  40 mg Oral QHS  . warfarin  2.5 mg Oral Custom  . warfarin  5 mg Oral Custom  . Warfarin - Pharmacist Dosing Inpatient   Does not apply q1800  . DISCONTD: sodium chloride   Intravenous STAT  . DISCONTD: albuterol-ipratropium  1 puff Inhalation QID  . DISCONTD: warfarin  2.5 mg Oral Custom     Assessment:  78 yo M admit with abdominal pain, history of rectal cancer with recurrence and possible intra-abdominal abscess seen on CT scan.  PTA on chronic warfarin 5 mg daily, except on Monday and Friday 2.5mg . Last dose 8/8.  INR is slightly supratherapeutic after continuing home dose 2.5mg   Hgb with small decrease; pt reports occasional bleeding per rectum, worse over the past two weeks  Goal of Therapy:  INR 2-3   Plan:    Hold warfarin today  Daily PT/INR   Lynann Beaver PharmD, BCPS Pager [phone] 12/12/2011 12:12 PM

## 2011-12-12 NOTE — Progress Notes (Signed)
Pt seems very depressed; making comments such as "come on in to my wake,"  "join the funeral arrangements"; family at bedside with patient. Will talk with family who is trying to remain positive. Will continue to monitor patient,

## 2011-12-12 NOTE — Progress Notes (Signed)
Medical Oncology On Call  Hospitalist had requested our service see if possible  prior to Dr Clelia Croft being available next week; no family is here presently and patient is sleeping quietly.  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 He had RT by Dr Roselind Messier with  xeloda thru Jan 2012, then FOLFOX x 7 cycles thru May 2012. He has been on observation by Dr Clelia Croft since then, with unremarkable CT CAP in Dec 2012. He is scheduled for outpatient CT CAP 12-15-11 and to see Dr Clelia Croft at [facility] on 12-18-11.  EMR reviewed. He has reportedly had rectal bleeding and weight loss of ~ 10 lbs since he saw Dr Clelia Croft last in April, tho I cannot tell that he has contacted our office or [facility] about these problems. Hemoglobin is down to 10.8 from  13.1 in April, with low iron studies also now. Patient was admitted to hospitalist service with abdominal pain on 12-11-11. CT AP shows apparent progression of rectal wall thickening with perirectal node 3x2 cm and left pelvic/perirectal fluid collection, small bowel in pelvis with possible radiation changes, no bowel obstruction. He has been seen by general surgery, with no surgical interventions recommended now. CXR does not show obvious pulmonary mets.   Dr Clelia Croft obviously will be best to discuss treatment options and decide treatment plan with patient and family, whether this be palliative interventions only or additional treatment. If he has ongoing bleeding, would ask [facility] to consider lower endoscopy.  Please call tomorrow if our service can assist then. I will let Dr Clelia Croft know of situation.  Jama Flavors, MD [phone]

## 2011-12-12 NOTE — Consult Note (Signed)
Reason for Consult: recurrent rectal carcinoma, abdominal pain  Referring Physician: Dr. Ashley Mariner, ER; Dr. Rito Ehrlich, [facility]  Oncologist:  Dr. Sterling Big Vincent [name] is an 78 y.o. male.   HPI: patient admitted to medical service from ER for abdominal pain, failure to thrive.  Patient with hx of locally advanced rectal carcinoma.  Exploratory laparotomy with partial resection and diverting end colostomy in Nov 2011.  Received some chemoRx and radiation therapy post-op.  Good functional status for several months.  Now with 3-4 months of increasing abdominal discomfort, occasional bleeding per rectum, worse over the past two weeks.  Patient seen and evaluated at bedside.  Discussed with two sons who are present this morning.  Past Medical History  Diagnosis Date  . Hypertension   . Arthritis   . Heart disease   . SOB (shortness of breath)   . Malignant neoplasm of hepatic flexure   . Malignant neoplasm of rectosigmoid junction   . Cancer     and/or colon polyp  . Malignant neoplasm of hepatic flexure   . Malignant neoplasm of rectosigmoid junction   . Diabetes mellitus     Past Surgical History  Procedure Date  . Back surgery [phone]  . Arthroscopic knee 1992  . 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 Exploratory laparotomy 02/2010  . Rectosigmoid colon resection 02/2010  . Colostomy 02/2010  . Drainage of intra-abdominal abscess 02/2010    History reviewed. No pertinent family history.  Social History:  reports that he has never smoked. He has never used smokeless tobacco. He reports that he does not drink alcohol or use illicit drugs.  Allergies: No Known Allergies  Medications: I have reviewed the patient's current medications.  Results for orders placed during the hospital encounter of 12/11/11 (from the past 48 hour(s))  CBC WITH DIFFERENTIAL     Status: Abnormal   Collection Time   12/11/11  2:23 PM      Component Value Range Comment   WBC 10.2  4.0 -  10.5 K/uL    RBC 3.40 (*) 4.22 - 5.81 MIL/uL    Hemoglobin 11.1 (*) 13.0 - 17.0 g/dL    HCT 14.7 (*) 82.9 - 52.0 %    MCV 93.5  78.0 - 100.0 fL    MCH 32.6  26.0 - 34.0 pg    MCHC 34.9  30.0 - 36.0 g/dL    RDW 56.2  13.0 - 86.5 %    Platelets 157  150 - 400 K/uL    Neutrophils Relative 82 (*) 43 - 77 %    Neutro Abs 8.4 (*) 1.7 - 7.7 K/uL    Lymphocytes Relative 7 (*) 12 - 46 %    Lymphs Abs 0.7  0.7 - 4.0 K/uL    Monocytes Relative 11  3 - 12 %    Monocytes Absolute 1.1 (*) 0.1 - 1.0 K/uL    Eosinophils Relative 1  0 - 5 %    Eosinophils Absolute 0.1  0.0 - 0.7 K/uL    Basophils Relative 0  0 - 1 %    Basophils Absolute 0.0  0.0 - 0.1 K/uL   COMPREHENSIVE METABOLIC PANEL     Status: Abnormal   Collection Time   12/11/11  2:23 PM      Component Value Range Comment   Sodium 132 (*) 135 - 145 mEq/L    Potassium 3.7  3.5 - 5.1 mEq/L    Chloride 96  96 - 112 mEq/L  CO2 27  19 - 32 mEq/L    Glucose, Bld 124 (*) 70 - 99 mg/dL    BUN 21  6 - 23 mg/dL    Creatinine, Ser 3.08  0.50 - 1.35 mg/dL    Calcium 9.0  8.4 - 65.7 mg/dL    Total Protein 6.7  6.0 - 8.3 g/dL    Albumin 2.8 (*) 3.5 - 5.2 g/dL    AST 25  0 - 37 U/L    ALT 22  0 - 53 U/L    Alkaline Phosphatase 74  39 - 117 U/L    Total Bilirubin 0.7  0.3 - 1.2 mg/dL    GFR calc non Af Amer 65 (*) >90 mL/min    GFR calc Af Amer 75 (*) >90 mL/min   PROTIME-INR     Status: Abnormal   Collection Time   12/11/11  2:23 PM      Component Value Range Comment   Prothrombin Time 28.6 (*) 11.6 - 15.2 seconds    INR 2.64 (*) 0.00 - 1.49   URINALYSIS, ROUTINE W REFLEX MICROSCOPIC     Status: Abnormal   Collection Time   12/11/11  2:24 PM      Component Value Range Comment   Color, Urine AMBER (*) YELLOW BIOCHEMICALS MAY BE AFFECTED BY COLOR   APPearance CLEAR  CLEAR    Specific Gravity, Urine 1.021  1.005 - 1.030    pH 5.5  5.0 - 8.0    Glucose, UA NEGATIVE  NEGATIVE mg/dL    Hgb urine dipstick NEGATIVE  NEGATIVE    Bilirubin Urine  SMALL (*) NEGATIVE    Ketones, ur NEGATIVE  NEGATIVE mg/dL    Protein, ur 30 (*) NEGATIVE mg/dL    Urobilinogen, UA 1.0  0.0 - 1.0 mg/dL    Nitrite NEGATIVE  NEGATIVE    Leukocytes, UA SMALL (*) NEGATIVE   URINE MICROSCOPIC-ADD ON     Status: Abnormal   Collection Time   12/11/11  2:24 PM      Component Value Range Comment   Squamous Epithelial / LPF FEW (*) RARE    WBC, UA 11-20  <3 WBC/hpf    Bacteria, UA FEW (*) RARE    Urine-Other MUCOUS PRESENT     BASIC METABOLIC PANEL     Status: Abnormal   Collection Time   12/12/11  4:08 AM      Component Value Range Comment   Sodium 130 (*) 135 - 145 mEq/L    Potassium 4.0  3.5 - 5.1 mEq/L    Chloride 98  96 - 112 mEq/L    CO2 25  19 - 32 mEq/L    Glucose, Bld 148 (*) 70 - 99 mg/dL    BUN 20  6 - 23 mg/dL    Creatinine, Ser 8.46  0.50 - 1.35 mg/dL    Calcium 8.4  8.4 - 96.2 mg/dL    GFR calc non Af Amer 68 (*) >90 mL/min    GFR calc Af Amer 79 (*) >90 mL/min   CBC     Status: Abnormal   Collection Time   12/12/11  4:08 AM      Component Value Range Comment   WBC 11.8 (*) 4.0 - 10.5 K/uL    RBC 3.37 (*) 4.22 - 5.81 MIL/uL    Hemoglobin 10.8 (*) 13.0 - 17.0 g/dL    HCT 95.2 (*) 84.1 - 52.0 %    MCV 95.5  78.0 - 100.0 fL  MCH 32.0  26.0 - 34.0 pg    MCHC 33.5  30.0 - 36.0 g/dL    RDW 98.1  19.1 - 47.8 %    Platelets 184  150 - 400 K/uL   RETICULOCYTES     Status: Abnormal   Collection Time   12/12/11  4:08 AM      Component Value Range Comment   Retic Ct Pct 1.2  0.4 - 3.1 %    RBC. 3.37 (*) 4.22 - 5.81 MIL/uL    Retic Count, Manual 40.4  19.0 - 186.0 K/uL   PROTIME-INR     Status: Abnormal   Collection Time   12/12/11  4:08 AM      Component Value Range Comment   Prothrombin Time 32.5 (*) 11.6 - 15.2 seconds    INR 3.11 (*) 0.00 - 1.49     Dg Chest 2 View  12/11/2011  *RADIOLOGY REPORT*  Clinical Data: Shortness of breath.  Generalized weakness. Abdominal pain.  History of colorectal cancer.  CHEST - 2 VIEW  Comparison: CT  chest 04/09/2011, 10/20/2010, and portable chest x- ray 06/04/2010.  Findings: Cardiac silhouette normal in size, unchanged.  Thoracic aorta mildly atherosclerotic, unchanged.  Hilar and mediastinal contours otherwise unremarkable.  Lungs mildly hyperinflated but clear.  Bronchovascular markings normal.  No pleural effusions. Degenerative changes involving the thoracic spine.  Left subclavian Port-A-Cath tip in the mid SVC.  Degenerative changes involving the right shoulder.  IMPRESSION: Mild hyperinflation consistent with COPD and/or asthma.  No acute cardiopulmonary disease.  Stable examination.  Original Report Authenticated By: Arnell Sieving, M.D.   Ct Abdomen Pelvis W Contrast  12/11/2011  *RADIOLOGY REPORT*  Clinical Data: Abdominal pain, hematuria, history of colon cancer status post resection in 2011  CT ABDOMEN AND PELVIS WITH CONTRAST  Technique:  Multidetector CT imaging of the abdomen and pelvis was performed following the standard protocol during bolus administration of intravenous contrast.  Contrast: OMNIPAQUE IOHEXOL 300 MG/ML  SOLN  Comparison: 04/09/2011  Findings: Lung bases are clear.  Multiple probable hepatic cysts, unchanged.  Spleen, pancreas, and adrenal glands within normal limits.  Gallbladder is unremarkable.  No intrahepatic or extrahepatic ductal dilatation.  1.4 cm posterior interpolar left renal cyst (series 2/image 27). Right kidney is unremarkable.  No hydronephrosis.  No evidence of bowel obstruction.  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  Normal appendix.  Left lower quadrant colostomy.  Moderate left parastomal hernia containing fat and a loop of nondilated small bowel (series 2/image 39).  Again seen is rectal wall thickening, likely mildly progressed (series 2/image 70), suspicious for recurrent tumor.  Adjacent 3.0 x 2.0 cm right perirectal node (series 2/image 61), previously 8  mm, compatible with nodal metastasis.  3.3 x 2.4 cm fluid collection arising from the left rectal wall (series 2/image 66).  Adjacent/contiguous 3.5 x 2.0 cm fluid collection in the left pelvis (series 2/image 60), with associated thin enhancing rim, worrisome for possible abscess.  Bilobed infrarenal abdominal aortic aneurysm measuring 3.4 and 3.7 cm in AP diameter.  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 Indwelling Foley catheter.  Prostate is unremarkable.  Direct extension of tumor into the right seminal vesicle is difficult to exclude (series 4/image 22).  Degenerative changes of the visualized thoracolumbar spine.  IMPRESSION: Recurrent rectal tumor with associated 3.0 x 2.0 cm right perirectal nodal metastasis.  Additional left pelvic/perirectal fluid collections, as described above, worrisome  for possible small abscess.  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  Left lower quadrant colostomy.  Parastomal hernia containing fat and a loop of nondilated small bowel.  No evidence of bowel obstruction.  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 Indwelling Foley catheter.  Original Report Authenticated By: Charline Bills, M.D.    Review of Systems  Constitutional: Positive for weight loss and malaise/fatigue. Negative for fever, chills and diaphoresis.  HENT: Negative.   Eyes: Negative.   Respiratory: Negative.   Cardiovascular: Negative.   Gastrointestinal: Positive for abdominal pain (diffuse, poorly localized abdominal pain) and blood in stool (bloody mucous discharge from rectum). Negative for nausea, vomiting, diarrhea and constipation.  Genitourinary: Negative.   Musculoskeletal: Positive for back pain.  Skin: Negative.   Neurological: Positive for weakness.  Endo/Heme/Allergies: Bruises/bleeds easily (on Coumadin).  Psychiatric/Behavioral: Negative.    Blood pressure 113/65,  pulse 75, temperature 98.8 F (37.1 C), temperature source Oral, resp. rate 18, height 5\' 11"  (1.803 m), weight 201 lb 9.6 oz (91.445 kg), SpO2 96.00%. Physical Exam  Constitutional: He is oriented to person, place, and time.       Cachetic appearing, lethargic  HENT:  Head: Normocephalic and atraumatic.  Right Ear: External ear normal.  Left Ear: External ear normal.  Mouth/Throat: Oropharynx is clear and moist.  Eyes: Conjunctivae and EOM are normal. Pupils are equal, round, and reactive to light. No scleral icterus.  Neck: Normal range of motion. Neck supple. No tracheal deviation present. No thyromegaly present.  Cardiovascular: Normal rate, regular rhythm and normal heart sounds.   No murmur heard. Respiratory: Effort normal and breath sounds normal. No respiratory distress. He has no wheezes.  GI: Bowel sounds are normal. He exhibits distension (mild distension). He exhibits no mass. There is tenderness (mild diffuse tenderness, poorly localized). There is no rebound and no guarding.  Musculoskeletal: Normal range of motion. He exhibits no edema.  Neurological: He is oriented to person, place, and time.  Skin: Skin is warm and dry.  Psychiatric: He has a normal mood and affect. His behavior is normal. Judgment and thought content normal.    Assessment/Plan: Recurrent rectal carcinoma  - will order CEA level for comparison to prior  - pain control per medical service  - palliative care consult  - please notify Dr. Clelia Croft of admission and room number - patient has follow up appt scheduled at [facility] this week  - discussed with sons today - little to offer from surgical standpoint.  No evidence of obstruction.  Suspect developing carcinomatosis not seen on CT.  PET scan may be more telling in this regard.  Will defer to medical oncology.  Velora Heckler, MD, [facility] Office: [phone]    Ryley Teater Judie Petit 12/12/2011, 12:00 PM

## 2011-12-12 NOTE — Telephone Encounter (Signed)
On call:  notified by hospitalist service that patient was admitted with abdominal pain, with CT showing apparent recurrent rectal ca and possible radiation enteritis. He has been seen by surgery with no interventions needed now. Patient is receiving IVF, prn pain meds and is taking some po's; there are no acute problems. We reviewed Dr Alver Fisher most recent office note and see that patient is on observation since completing FOLFOX in May 2012, previously RT and xeloda thru Jan 2012. He is already scheduled to see Dr Clelia Croft on 12-18-11 with outpatient CT "body" ordered for  12-15-11. Treatment decisions will be best made by Dr Clelia Croft, tho our service is glad to see if acute problems this weekend, or otherwise if on call MDs are able to do that. I suggested hospitalist service call radiology to see what scans are ordered for 12-15-11, tho I would not get inpatient PET unless emergent.  Jama Flavors, MD

## 2011-12-13 ENCOUNTER — Inpatient Hospital Stay (HOSPITAL_COMMUNITY): Payer: Medicare Other

## 2011-12-13 DIAGNOSIS — R109 Unspecified abdominal pain: Secondary | ICD-10-CM

## 2011-12-13 LAB — CBC
HCT: 34.6 % — ABNORMAL LOW (ref 39.0–52.0)
Hemoglobin: 11.7 g/dL — ABNORMAL LOW (ref 13.0–17.0)
MCV: 94.5 fL (ref 78.0–100.0)
RDW: 14.8 % (ref 11.5–15.5)
WBC: 11.9 10*3/uL — ABNORMAL HIGH (ref 4.0–10.5)

## 2011-12-13 LAB — URINE CULTURE

## 2011-12-13 LAB — BASIC METABOLIC PANEL
BUN: 22 mg/dL (ref 6–23)
CO2: 24 mEq/L (ref 19–32)
Chloride: 97 mEq/L (ref 96–112)
Creatinine, Ser: 1.09 mg/dL (ref 0.50–1.35)
GFR calc Af Amer: 74 mL/min — ABNORMAL LOW (ref >90)
Potassium: 4.1 mEq/L (ref 3.5–5.1)

## 2011-12-13 LAB — PROTIME-INR
INR: 4.32 — ABNORMAL HIGH (ref 0.00–1.49)
Prothrombin Time: 42 seconds — ABNORMAL HIGH (ref 11.6–15.2)

## 2011-12-13 MED ORDER — IPRATROPIUM-ALBUTEROL 20-100 MCG/ACT IN AERS
1.0000 | INHALATION_SPRAY | Freq: Four times a day (QID) | RESPIRATORY_TRACT | Status: DC
  Administered 2011-12-13 – 2011-12-28 (×59): 1 via RESPIRATORY_TRACT
  Filled 2011-12-13: qty 4

## 2011-12-13 MED ORDER — FERROUS SULFATE 325 (65 FE) MG PO TABS
325.0000 mg | ORAL_TABLET | Freq: Every day | ORAL | Status: DC
  Administered 2011-12-14 – 2011-12-28 (×13): 325 mg via ORAL
  Filled 2011-12-13 (×16): qty 1

## 2011-12-13 MED ORDER — IOHEXOL 300 MG/ML  SOLN
100.0000 mL | Freq: Once | INTRAMUSCULAR | Status: AC | PRN
  Administered 2011-12-13: 100 mL via INTRAVENOUS

## 2011-12-13 NOTE — Progress Notes (Signed)
ANTICOAGULATION CONSULT NOTE  Pharmacy Consult for warfarin Indication: VTE treatment  No Known Allergies  Patient Measurements: Height: 5\' 11"  (180.3 cm) Weight: 201 lb 9.6 oz (91.445 kg) IBW/kg (Calculated) : 75.3    Vital Signs: Temp: 98.1 F (36.7 C) (08/11 0504) Temp src: Oral (08/11 0504) BP: 142/68 mmHg (08/11 0504) Pulse Rate: 70  (08/11 0504)  Labs:  Basename 12/13/11 0355 12/12/11 0408 12/11/11 1423  HGB 11.7* 10.8* --  HCT 34.6* 32.2* 31.8*  PLT 205 184 157  APTT -- -- --  LABPROT 42.0* 32.5* 28.6*  INR 4.32* 3.11* 2.64*  HEPARINUNFRC -- -- --  CREATININE 1.09 1.03 1.08  CKTOTAL -- -- --  CKMB -- -- --  TROPONINI -- -- --   Estimated Creatinine Clearance: 66.6 ml/min (by C-G formula based on Cr of 1.09).   Medications:     . ampicillin-sulbactam (UNASYN) IV  1.5 g Intravenous Q6H  . antiseptic oral rinse  15 mL Mouth Rinse q12n4p  . aspirin EC  81 mg Oral Daily  . chlorhexidine  15 mL Mouth Rinse BID  . digoxin  125 mcg Oral Daily  . Fluticasone-Salmeterol  1 puff Inhalation Q12H  . Ipratropium-Albuterol  1 puff Inhalation QID  . metFORMIN  500 mg Oral QHS  . omega-3 acid ethyl esters  1 g Oral Daily  . simvastatin  40 mg Oral QHS  . Warfarin - Pharmacist Dosing Inpatient   Does not apply q1800  . DISCONTD: warfarin  2.5 mg Oral Custom  . DISCONTD: warfarin  5 mg Oral Custom     Assessment:  78 yo M admit with abdominal pain, history of rectal cancer with recurrence and possible intra-abdominal abscess seen on CT scan.  PTA on chronic warfarin 5 mg daily, except on Monday and Friday 2.5mg   Pt reports occasional bleeding per rectum, worse over the past two weeks;  Hgb improved today.  Follow.  INR was supratherapeutic and has continued to increase despite holding warfarin. (Last dose 8/9 2300)  Potential drug interaction with Unasyn (may increase INR)  Goal of Therapy:  INR 2-3   Plan:   Hold warfarin today  Daily  PT/INR   Lynann Beaver PharmD, BCPS Pager [phone] 12/13/2011 9:13 AM

## 2011-12-13 NOTE — Progress Notes (Signed)
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) -pain management, IVF - supportive care and follow  Active Problems:  Rectal carcinoma with recurrence - CEA still peding- follow, surgery suspecting developing carcinomatosis -onc consulted, dicussed pt with Dr Darrold Span and she recommends holding off PET while inpt -pt was scheduled for CT abd and chest on 8/13 per Dr Clelia Croft, CT abd done and results as above, CT chest with no evidence of metastatic dz- onc/Dr Clelia Croft to follow Anemia, Fe deficiency -hgb stable, no further rectal bleeding reported/ decrease hgb -will start supplemental Fe H/o PAF -On chronic coumadin, INR Still supratherapeutic  -coumadin, hold today, pharmacy following ?intraabdominal abscess  -per CT, on empiric ABX - per surgery- little to offer from surgical standpoint,  -will ask JY:NWGN for further abx in am Thrombocytopenia  -plt count improved and remains stable Hyponatremia  -vol depletion vs SIADH -decrease IVF, follow and recheck in am Warfarin-induced coagulopathy  -hold coumadin as above, follow and recheck -no gross bleeding Weakness generalized -likely secondary to above Code Status: full Family Communication: wife via [phone]) Disposition Plan: to home when medically stable   Brief narrative: The pt is [name] with a history of Rectal Cancer diagnosed in 02/2010 S/P surgical resection and colostomy along with Chemotherapy and Radiation treatments who presents with 2 weeks of worsening diffuse ABD Pain along with generalized weakness.  He was evaluated in the ED and found to found to have recurrence of cancer on Ct scan of  the ABD and Pelvis. He was  Admitted for further evaluation and management.  General Surgery/Dr Gerrit Friends was consulted   Consultants:  surgery  Procedures:  none  Antibiotics:  unasyn  HPI/Subjective: Pt denies rectal bleeding, wife at bedside and states he might have have some rectal bleeding on the day of admission. Pt  States abd pain about the same, with nausea intermittently, he wants to stay on the liquids for now.  Objective: Filed Vitals:   12/13/11 0504 12/13/11 0801 12/13/11 1015 12/13/11 1444  BP: 142/68  128/72 113/73  Pulse: 70  68 80  Temp: 98.1 F (36.7 C)  98.7 F (37.1 C) 98.5 F (36.9 C)  TempSrc: Oral  Oral Oral  Resp: 18  19 18   Height:      Weight:      SpO2: 94% 91% 92% 94%    Intake/Output Summary (Last 24 hours) at 12/13/11 1623 Last data filed at 12/13/11 0504  Gross per 24 hour  Intake    300 ml  Output    475 ml  Net   -175 ml   Filed Weights   12/11/11 2124  Weight: 91.445 kg (201 lb 9.6 oz)    Exam:   General:  Somnolent but easily aroused, in no apparent distress  Cardiovascular: RRR, nl S1s2  Respiratory:decreased Bs at bases  Abdomen: soft, +Bs, tenderness present in lower abd.+costomy with brown stool on L.abd  EXT: no cyanosis, no edema  Data Reviewed: Basic Metabolic Panel:  Lab 12/13/11 6962 12/12/11 0408 12/11/11 1423  NA 130* 130* 132*  K 4.1 4.0 3.7  CL 97 98 96  CO2 24 25 27   GLUCOSE 160* 148* 124*  BUN 22 20 21   CREATININE 1.09 1.03 1.08  CALCIUM 8.2* 8.4 9.0  MG -- -- --  PHOS -- -- --   Liver Function Tests:  Lab 12/11/11 1423  AST 25  ALT 22  ALKPHOS 74  BILITOT 0.7  PROT 6.7  ALBUMIN 2.8*   No results found for this basename: LIPASE:5,AMYLASE:5 in the last 168 hours No results found for this basename: AMMONIA:5 in the last 168 hours CBC:  Lab 12/13/11 0355 12/12/11 0408 12/11/11 1423  WBC 11.9* 11.8* 10.2  NEUTROABS -- -- 8.4*  HGB 11.7* 10.8* 11.1*  HCT 34.6* 32.2* 31.8*  MCV 94.5  95.5 93.5  PLT 205 184 157   Cardiac Enzymes: No results found for this basename: CKTOTAL:5,CKMB:5,CKMBINDEX:5,TROPONINI:5 in the last 168 hours BNP (last 3 results) No results found for this basename: PROBNP:3 in the last 8760 hours CBG: No results found for this basename: GLUCAP:5 in the last 168 hours  Recent Results (from the past 240 hour(s))  URINE CULTURE     Status: Normal   Collection Time   12/11/11  2:24 PM      Component Value Range Status Comment   Specimen Description URINE, RANDOM   Final    Special Requests NONE   Final    Culture  Setup Time 12/12/2011 02:01   Final    Colony Count 8,000 COLONIES/ML   Final    Culture INSIGNIFICANT GROWTH   Final    Report Status 12/13/2011 FINAL   Final      Studies: Dg Chest 2 View  12/11/2011  *RADIOLOGY REPORT*  Clinical Data: Shortness of breath.  Generalized weakness. Abdominal pain.  History of colorectal cancer.  CHEST - 2 VIEW  Comparison: CT chest 04/09/2011, 10/20/2010, and portable chest x- ray 06/04/2010.  Findings: Cardiac silhouette normal in size, unchanged.  Thoracic aorta mildly atherosclerotic, unchanged.  Hilar and mediastinal contours otherwise unremarkable.  Lungs mildly hyperinflated but clear.  Bronchovascular markings normal.  No pleural effusions. Degenerative changes involving the thoracic spine.  Left subclavian Port-A-Cath tip in the mid SVC.  Degenerative changes involving the right shoulder.  IMPRESSION: Mild hyperinflation consistent with COPD and/or asthma.  No acute cardiopulmonary disease.  Stable examination.  Original Report Authenticated By: Arnell Sieving, M.D.   Ct Abdomen Pelvis W Contrast  12/11/2011  *RADIOLOGY REPORT*  Clinical Data: Abdominal pain, hematuria, history of colon cancer status post resection in 2011  CT ABDOMEN AND PELVIS WITH CONTRAST  Technique:  Multidetector CT imaging of the abdomen and pelvis was performed following the standard protocol during bolus administration of  intravenous contrast.  Contrast: OMNIPAQUE IOHEXOL 300 MG/ML  SOLN  Comparison: 04/09/2011  Findings: Lung bases are clear.  Multiple probable hepatic cysts, unchanged.  Spleen, pancreas, and adrenal glands within normal limits.  Gallbladder is unremarkable.  No intrahepatic or extrahepatic ductal dilatation.  1.4 cm posterior interpolar left renal cyst (series 2/image 27). Right kidney is unremarkable.  No hydronephrosis.  No evidence of bowel obstruction.  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  Normal appendix.  Left lower quadrant colostomy.  Moderate left parastomal hernia containing fat and a loop of nondilated small bowel (series 2/image 39).  Again seen is rectal wall thickening, likely mildly progressed (series 2/image 70), suspicious for recurrent tumor.  Adjacent 3.0 x 2.0 cm right perirectal node (series 2/image 61), previously 8 mm, compatible with nodal metastasis.  3.3 x 2.4 cm fluid collection arising from the left rectal wall (series 2/image 66).  Adjacent/contiguous 3.5 x  2.0 cm fluid collection in the left pelvis (series 2/image 60), with associated thin enhancing rim, worrisome for possible abscess.  Bilobed infrarenal abdominal aortic aneurysm measuring 3.4 and 3.7 cm in AP diameter.  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 Indwelling Foley catheter.  Prostate is unremarkable.  Direct extension of tumor into the right seminal vesicle is difficult to exclude (series 4/image 22).  Degenerative changes of the visualized thoracolumbar spine.  IMPRESSION: Recurrent rectal tumor with associated 3.0 x 2.0 cm right perirectal nodal metastasis.  Additional left pelvic/perirectal fluid collections, as described above, worrisome for possible small abscess.  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  Left lower quadrant colostomy.   Parastomal hernia containing fat and a loop of nondilated small bowel.  No evidence of bowel obstruction.  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 Indwelling Foley catheter.  Original Report Authenticated By: Charline Bills, M.D.    Scheduled Meds:    . ampicillin-sulbactam (UNASYN) IV  1.5 g Intravenous Q6H  . antiseptic oral rinse  15 mL Mouth Rinse q12n4p  . aspirin EC  81 mg Oral Daily  . chlorhexidine  15 mL Mouth Rinse BID  . digoxin  125 mcg Oral Daily  . Fluticasone-Salmeterol  1 puff Inhalation Q12H  . Ipratropium-Albuterol  1 puff Inhalation QID  . metFORMIN  500 mg Oral QHS  . omega-3 acid ethyl esters  1 g Oral Daily  . simvastatin  40 mg Oral QHS  . Warfarin - Pharmacist Dosing Inpatient   Does not apply q1800  . DISCONTD: Ipratropium-Albuterol  1 puff Inhalation QID   Continuous Infusions:    . sodium chloride 75 mL/hr at 12/13/11 1610    Principal Problem:  *Abdominal pain Active Problems:  Rectal carcinoma  Anemia  Thrombocytopenia  Hyponatremia  Warfarin-induced coagulopathy  Weakness generalized    Time spent:    Kela Millin  [facility] Pager [phone]. If 8PM-8AM, please contact night-coverage at [email or web address], password [facility] 12/13/2011, 4:23 PM  LOS: 2 days

## 2011-12-13 NOTE — ED Provider Notes (Signed)
I saw and evaluated the patient, reviewed the resident's note and I agree with the findings and plan.  Patient has some degree of urinary retention.  Foley catheter placed a 500 cc of urine drained.  The patient remained with lower abdominal pain and a history of prior cancer.  CT scan pending to evaluate for recurrent cancer and/or intra-abdominal process. Care to Dr Payton Mccallum  Lyanne Co, MD 12/13/11 2325

## 2011-12-14 DIAGNOSIS — I4891 Unspecified atrial fibrillation: Secondary | ICD-10-CM

## 2011-12-14 LAB — PROTIME-INR: INR: 5.45 (ref 0.00–1.49)

## 2011-12-14 LAB — BASIC METABOLIC PANEL
BUN: 21 mg/dL (ref 6–23)
CO2: 26 mEq/L (ref 19–32)
Chloride: 98 mEq/L (ref 96–112)
Creatinine, Ser: 1.03 mg/dL (ref 0.50–1.35)
Glucose, Bld: 142 mg/dL — ABNORMAL HIGH (ref 70–99)
Potassium: 3.8 mEq/L (ref 3.5–5.1)

## 2011-12-14 LAB — CBC
HCT: 33.5 % — ABNORMAL LOW (ref 39.0–52.0)
Hemoglobin: 11.3 g/dL — ABNORMAL LOW (ref 13.0–17.0)
MCV: 95.2 fL (ref 78.0–100.0)
WBC: 10.9 10*3/uL — ABNORMAL HIGH (ref 4.0–10.5)

## 2011-12-14 MED ORDER — METOCLOPRAMIDE HCL 5 MG/ML IJ SOLN
5.0000 mg | Freq: Three times a day (TID) | INTRAMUSCULAR | Status: DC
  Administered 2011-12-14 – 2011-12-15 (×3): 5 mg via INTRAVENOUS
  Filled 2011-12-14 (×3): qty 1
  Filled 2011-12-14: qty 2
  Filled 2011-12-14: qty 1
  Filled 2011-12-14 (×2): qty 2

## 2011-12-14 NOTE — Progress Notes (Signed)
Dellia Nims Pharmacist also informed of critical INR, no new orders received.

## 2011-12-14 NOTE — Progress Notes (Signed)
ANTICOAGULATION CONSULT NOTE  Pharmacy Consult for warfarin Indication: VTE treatment  No Known Allergies  Patient Measurements: Height: 5\' 11"  (180.3 cm) Weight: 201 lb 9.6 oz (91.445 kg) IBW/kg (Calculated) : 75.3    Vital Signs: Temp: 97.6 F (36.4 C) (08/12 0514) Temp src: Oral (08/12 0514) BP: 145/71 mmHg (08/12 0514) Pulse Rate: 89  (08/12 0514)  Labs:  Basename 12/14/11 0340 12/13/11 0355 12/12/11 0408  HGB 11.3* 11.7* --  HCT 33.5* 34.6* 32.2*  PLT 237 205 184  APTT -- -- --  LABPROT 50.4* 42.0* 32.5*  INR 5.45* 4.32* 3.11*  HEPARINUNFRC -- -- --  CREATININE 1.03 1.09 1.03  CKTOTAL -- -- --  CKMB -- -- --  TROPONINI -- -- --   Estimated Creatinine Clearance: 70.5 ml/min (by C-G formula based on Cr of 1.03).   Medications:     . ampicillin-sulbactam (UNASYN) IV  1.5 g Intravenous Q6H  . antiseptic oral rinse  15 mL Mouth Rinse q12n4p  . aspirin EC  81 mg Oral Daily  . chlorhexidine  15 mL Mouth Rinse BID  . digoxin  125 mcg Oral Daily  . ferrous sulfate  325 mg Oral Q breakfast  . Fluticasone-Salmeterol  1 puff Inhalation Q12H  . Ipratropium-Albuterol  1 puff Inhalation QID  . metFORMIN  500 mg Oral QHS  . omega-3 acid ethyl esters  1 g Oral Daily  . simvastatin  40 mg Oral QHS  . Warfarin - Pharmacist Dosing Inpatient   Does not apply q1800  . DISCONTD: Ipratropium-Albuterol  1 puff Inhalation QID     Assessment:  78 yo M admit with abdominal pain, history of rectal cancer with recurrence and possible intra-abdominal abscess seen on CT scan.  PTA on chronic warfarin 5 mg daily, except on Monday and Friday 2.5mg   Had reports of rectal bleeding on admission, no further reports of bleeding  H/H stable  INR was supratherapeutic, continues to increase despite holding warfarin. (Last dose 8/10 @0012 , 2.5mg )  Potential drug interaction with Unasyn (may increase INR)  Goal of Therapy:  INR 2-3   Plan:   Hold warfarin today  Daily  PT/INR  Gwen Her PharmD  [phone] 12/14/2011 8:50 AM '

## 2011-12-14 NOTE — Evaluation (Signed)
Physical Therapy Evaluation Patient Details Name: Vincent [name] MRN: [id number] DOB: March 29, 1933 Today's Date: 12/14/2011 Time: 7829-5621 PT Time Calculation (min): 14 min  PT Assessment / Plan / Recommendation Clinical Impression  78 yo male admitted with abdominal pain found to have a recurrence of rectal cancer.  He has been vomiting and is showing signs of deconditioing.  He will benefit from PT to increase strength and activity tolerance and educate in exercise program to prepare for upcoming chemo and radiation treatment    PT Assessment  Patient needs continued PT services    Follow Up Recommendations  No PT follow up    Barriers to Discharge        Equipment Recommendations  None recommended by PT    Recommendations for Other Services     Frequency Min 3X/week    Precautions / Restrictions     Pertinent Vitals/Pain Pt with nausea today      Mobility  Bed Mobility Bed Mobility: Supine to Sit Supine to Sit: 4: Min assist Details for Bed Mobility Assistance: did not roll due pt with nausea this am Transfers Transfers: Sit to Stand;Stand to Sit Sit to Stand: 4: Min assist Stand to Sit: 4: Min assist Ambulation/Gait Ambulation/Gait Assistance: 4: Min Environmental consultant (Feet): 75 Feet Assistive device: Rolling walker Ambulation/Gait Assistance Details: pt needed some assist controlling RW Gait Pattern: Within Functional Limits Gait velocity: decreased General Gait Details: pt generally does not feel well today, but he was able to ambulate with RW without much difficulty    Exercises     PT Diagnosis: Difficulty walking;Generalized weakness  PT Problem List: Decreased strength;Decreased activity tolerance;Decreased knowledge of use of DME PT Treatment Interventions: DME instruction;Gait training;Functional mobility training;Therapeutic exercise   PT Goals Acute Rehab PT Goals PT Goal Formulation: With patient Time For Goal Achievement:  12/28/11 Potential to Achieve Goals: Good Pt will go Supine/Side to Sit: Independently PT Goal: Supine/Side to Sit - Progress: Goal set today Pt will go Sit to Stand: Independently PT Goal: Sit to Stand - Progress: Goal set today Pt will Ambulate: >150 feet;with modified independence;with least restrictive assistive device PT Goal: Ambulate - Progress: Goal set today Pt will Perform Home Exercise Program: Independently PT Goal: Perform Home Exercise Program - Progress: Goal set today  Visit Information  Last PT Received On: 12/14/11 Assistance Needed: +1    Subjective Data  Subjective: this is the first time I've walked in 3 days Patient Stated Goal: none stated   Prior Functioning  Home Living Lives With: Family Available Help at Discharge: Family Type of Home: House Home Access: Level entry Home Layout: One level Home Adaptive Equipment: Wheelchair - manual;Walker - rolling Prior Function Level of Independence: Independent Communication Communication: No difficulties    Cognition  Overall Cognitive Status: Appears within functional limits for tasks assessed/performed Arousal/Alertness: Awake/alert Orientation Level: Appears intact for tasks assessed Behavior During Session: [facility] for tasks performed Cognition - Other Comments: pt appears discouraged at the thought of upcoming chemo and radiation    Extremity/Trunk Assessment Right Lower Extremity Assessment RLE ROM/Strength/Tone: Within functional levels Left Lower Extremity Assessment LLE ROM/Strength/Tone: Within functional levels Trunk Assessment Trunk Assessment: Normal   Balance Balance Balance Assessed: No  End of Session PT - End of Session Activity Tolerance: Patient limited by fatigue Patient left: in chair  GP     Donnetta Hail 12/14/2011, 11:36 AM

## 2011-12-14 NOTE — Progress Notes (Signed)
IP PROGRESS NOTE  Subjective:   Principle Diagnosis: This is a 78 year old gentleman diagnosed with adenocarcinoma of the rectum. He had T4a N2 disease diagnosed in 2011. He presented obstruction of the distal rectal area.   Prior Therapy:  1. Status post rectosigmoid resection and segmental resection on February 13, 2010. Tumor was poorly differentiated adenocarcinoma, 8 out of 16 lymph nodes involved, tumor not completely resected at that time.  2. The patient received radiation therapy adamantly concomitantly with Xeloda. Therapy concluded in January 2012.  3. Treated with adjuvant FOLFOX for a total of 7 cycles of therapy. The therapy concluded Sep 16, 2010.  Patient was admitted on 8/9 with complaints of abdominal pain and FTT. CT scan of abdomin and pelvis on 8/9 showed:  Recurrent rectal tumor with associated 3.0 x 2.0 cm right  perirectal nodal metastasis.  Additional left pelvic/perirectal fluid collections, as described  above, worrisome for possible small abscess.  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  Patient still in pain this am with very limited po intake. His pain is rather diffuse in nature.   Objective:  Vital signs in last 24 hours: Temp:  [97.6 F (36.4 C)-98.7 F (37.1 C)] 97.6 F (36.4 C) (08/12 0514) Pulse Rate:  [68-93] 89  (08/12 0514) Resp:  [18-21] 19  (08/12 0514) BP: (113-146)/(64-73) 145/71 mmHg (08/12 0514) SpO2:  [91 %-96 %] 92 % (08/12 0514) Weight change:  Last BM Date: 12/13/11  Intake/Output from previous day: 08/11 0701 - 08/12 0700 In: 300 [P.O.:300] Out: 500 [Urine:500]  Mouth: mucous membranes moist, pharynx normal without lesions Resp: clear to auscultation bilaterally Cardio: regular rate and rhythm, S1, S2 normal, no murmur, click, rub or gallop GI: soft, non-tender; bowel sounds normal; no masses,  no organomegaly Extremities: extremities normal, atraumatic, no  cyanosis or edema  Portacath/PICC-without erythema  Lab Results:  Basename 12/14/11 0340 12/13/11 0355  WBC 10.9* 11.9*  HGB 11.3* 11.7*  HCT 33.5* 34.6*  PLT 237 205    BMET  Basename 12/14/11 0340 12/13/11 0355  NA 132* 130*  K 3.8 4.1  CL 98 97  CO2 26 24  GLUCOSE 142* 160*  BUN 21 22  CREATININE 1.03 1.09  CALCIUM 8.5 8.2*    Studies/Results: Ct Chest W Contrast  12/13/2011  *RADIOLOGY REPORT*  Clinical Data: Restaging for metastases.  Recurrent rectal cancer.  CT CHEST WITH CONTRAST  Technique:  Multidetector CT imaging of the chest was performed following the standard protocol during bolus administration of intravenous contrast.  Contrast: OMNIPAQUE IOHEXOL 300 MG/ML  SOLN  Comparison: CT chest dated 04/09/2011  Findings: Trace bilateral pleural effusions with minimal dependent atelectasis in the posterior right upper and bilateral lower lobes. No suspicious pulmonary nodules.  No pleural effusion or pneumothorax.  Visualized thyroid is unremarkable.  The heart is normal in size.  No pericardial effusion.  Mild atherosclerotic calcifications of the aortic arch.  Small mediastinal lymph nodes measuring up to 8 mm short axis, which do not meet pathologic CT size criteria.  No suspicious mediastinal or axillary lymphadenopathy.  Left chest port.  Visualized upper abdomen is notable for multiple tiny probable hepatic cysts, better visualized on recent CT abdomen pelvis.  Mild degenerative changes of the visualized thoracolumbar spine.  IMPRESSION: No evidence of metastatic disease in the chest.  Trace bilateral pleural effusions with associated minimal dependent atelectasis.  Original Report Authenticated By: Charline Bills, M.D.    Medications:  I have reviewed the patient's current medications.  Assessment/Plan:  This is a pleasant 78 year old gentleman with the following issues:   1. Advanced rectal carcinoma, presented with a T4 N2 disease in 2011. He presented with  a complete obstruction. After segmental resection, he underwent radiation therapy with Xeloda and subsequently systemic chemotherapy. At this time, he has really no evidence of any recurrent disease, at least that is grossly evident. He has always been certainly at high risk of local recurrence, as well as systemic recurrence. His last CT chest, abdomen, and pelvis showed Recurrent rectal tumor with associated 3.0 x 2.0 cm right perirectal nodal metastasis. No diffuse disease noted.  He certainly could have carcinomatosis but I do not think a PET/CT would add anything at this point. He will need salvage chemotherapy in the neat future once  His clinical status stabilizes and preferably as out patient.      2. Abdominal pain: this could be in addition to his cancer related to enteritis. I agree with supportive management like you are doing. I hope his pain will improve and he can tolerate po and can be discharged home and can treat his cancer as out patient.    3. Port-A-Cath management will be Flushed today and every 6-8 weeks. No being used now.   4 . Atrial fibrillation. INR levels are noted. Warfarin-induced coagulopathy being addressed by the primary team.   Will follow with you.         LOS: 3 days   [facility] 12/14/2011, 7:46 AM

## 2011-12-14 NOTE — Progress Notes (Signed)
CRITICAL VALUE ALERT  Critical value received:  INR 5.45  Date of notification:  8/12.13  Time of notification:  0437  Critical value read back:yes  Nurse who received alert:  Hedda Slade  MD notified (1st page):  Benedetto Coons  Time of first page:  0440  MD notified (2nd page):  Time of second page:  Responding MD:  Claiborne Billings  Time MD responded:  [phone]

## 2011-12-14 NOTE — Progress Notes (Signed)
OT Cancellation Note  Treatment cancelled today due to medical issues with patient which prohibited therapy. Pt with critical INR of 5.45. Will check back another time.  India Jolin A OTR/L [phone] 12/14/2011, 10:20 AM

## 2011-12-14 NOTE — Progress Notes (Signed)
UR done. 

## 2011-12-14 NOTE — Progress Notes (Signed)
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 Clelia Croft input, chemotherapy planned for outpatient Anemia, Fe deficiency -hgb stable, no further rectal bleeding reported/ decrease hgb -will start supplemental Fe H/o PAF -On chronic coumadin, INR Still supratherapeutic  -coumadin, hold today, pharmacy following ?intraabdominal abscess  -per CT, on empiric ABX - per surgery- little to offer from surgical standpoint,  -Discussed with surgery and recommend antibiotics if patient has a white cell count or fever. He did have a mild leukocytosis to 11.9 following admission-so will continue empiric antibiotics-10-14 day course Thrombocytopenia  -plt count improved and remains stable Hyponatremia  -vol depletion vs SIADH -improved, follow Warfarin-induced coagulopathy  -INR is still trending up despite holding Coumadin, no gross bleeding and hemoglobin stable.  -We'll continue to hold Coumadin follow and recheck. Weakness generalized -likely secondary to above Code Status: full Family Communication: wife via  [phone]) Disposition Plan: to home when medically stable   Brief narrative: The pt is [name] with a history of Rectal Cancer diagnosed in 02/2010 S/P surgical resection and colostomy along with Chemotherapy and Radiation treatments who presents with 2 weeks of worsening diffuse ABD Pain along with generalized weakness.  He was evaluated in the ED and found to found to have recurrence of cancer on Ct scan of the ABD and Pelvis. He was  Admitted for further evaluation and management.  General Surgery/Dr Gerrit Friends was consulted   Consultants:  Surgery  Oncology  Procedures:  none  Antibiotics:  unasyn  HPI/Subjective: Pt still with nausea and vomiting-x2 so far this am, denies rectal bleeding, abdominal pain about the same. Objective: Filed Vitals:   12/14/11 0514 12/14/11 0745 12/14/11 1023 12/14/11 1124  BP: 145/71  133/74   Pulse: 89  88   Temp: 97.6 F (36.4 C)  97.9 F (36.6 C)   TempSrc: Oral  Oral   Resp: 19  18   Height:      Weight:      SpO2: 92% 91% 90% 94%    Intake/Output Summary (Last 24 hours) at 12/14/11 1312 Last data filed at 12/14/11 0935  Gross per 24 hour  Intake    420 ml  Output    500 ml  Net    -80 ml   Filed Weights   12/11/11 2124  Weight: 91.445 kg (201 lb 9.6 oz)    Exam:   General:  Somnolent but easily aroused, in no apparent distress  Cardiovascular: RRR, nl S1s2  Respiratory:decreased Bs at bases  Abdomen: soft, +Bs, tenderness present in lower, also left upper quadrant tenderness .+costomy with brown stool on L.abd  EXT: no cyanosis, no edema  Data Reviewed: Basic Metabolic Panel:  Lab 12/14/11 4696 12/13/11 0355 12/12/11 0408 12/11/11  1423  NA 132* 130* 130* 132*  K 3.8 4.1 4.0 3.7  CL 98 97 98 96  CO2 26 24 25 27   GLUCOSE 142* 160* 148* 124*  BUN 21 22 20 21   CREATININE 1.03 1.09 1.03 1.08  CALCIUM 8.5 8.2* 8.4 9.0  MG -- -- -- --  PHOS -- -- -- --   Liver Function Tests:  Lab 12/11/11 1423    AST 25  ALT 22  ALKPHOS 74  BILITOT 0.7  PROT 6.7  ALBUMIN 2.8*   No results found for this basename: LIPASE:5,AMYLASE:5 in the last 168 hours No results found for this basename: AMMONIA:5 in the last 168 hours CBC:  Lab 12/14/11 0340 12/13/11 0355 12/12/11 0408 12/11/11 1423  WBC 10.9* 11.9* 11.8* 10.2  NEUTROABS -- -- -- 8.4*  HGB 11.3* 11.7* 10.8* 11.1*  HCT 33.5* 34.6* 32.2* 31.8*  MCV 95.2 94.5 95.5 93.5  PLT 237 205 184 157   Cardiac Enzymes: No results found for this basename: CKTOTAL:5,CKMB:5,CKMBINDEX:5,TROPONINI:5 in the last 168 hours BNP (last 3 results) No results found for this basename: PROBNP:3 in the last 8760 hours CBG: No results found for this basename: GLUCAP:5 in the last 168 hours  Recent Results (from the past 240 hour(s))  URINE CULTURE     Status: Normal   Collection Time   12/11/11  2:24 PM      Component Value Range Status Comment   Specimen Description URINE, RANDOM   Final    Special Requests NONE   Final    Culture  Setup Time 12/12/2011 02:01   Final    Colony Count 8,000 COLONIES/ML   Final    Culture INSIGNIFICANT GROWTH   Final    Report Status 12/13/2011 FINAL   Final      Studies: Dg Chest 2 View  12/11/2011  *RADIOLOGY REPORT*  Clinical Data: Shortness of breath.  Generalized weakness. Abdominal pain.  History of colorectal cancer.  CHEST - 2 VIEW  Comparison: CT chest 04/09/2011, 10/20/2010, and portable chest x- ray 06/04/2010.  Findings: Cardiac silhouette normal in size, unchanged.  Thoracic aorta mildly atherosclerotic, unchanged.  Hilar and mediastinal contours otherwise unremarkable.  Lungs mildly hyperinflated but clear.  Bronchovascular markings normal.  No pleural effusions. Degenerative changes involving the thoracic spine.  Left subclavian Port-A-Cath tip in the mid SVC.  Degenerative changes involving the right shoulder.  IMPRESSION: Mild hyperinflation consistent with COPD and/or asthma.  No acute cardiopulmonary disease.   Stable examination.  Original Report Authenticated By: Arnell Sieving, M.D.   Ct Abdomen Pelvis W Contrast  12/11/2011  *RADIOLOGY REPORT*  Clinical Data: Abdominal pain, hematuria, history of colon cancer status post resection in 2011  CT ABDOMEN AND PELVIS WITH CONTRAST  Technique:  Multidetector CT imaging of the abdomen and pelvis was performed following the standard protocol during bolus administration of intravenous contrast.  Contrast: OMNIPAQUE IOHEXOL 300 MG/ML  SOLN  Comparison: 04/09/2011  Findings: Lung bases are clear.  Multiple probable hepatic cysts, unchanged.  Spleen, pancreas, and adrenal glands within normal limits.  Gallbladder is unremarkable.  No intrahepatic or extrahepatic ductal dilatation.  1.4 cm posterior interpolar left renal cyst (series 2/image 27). Right kidney is unremarkable.  No hydronephrosis.  No evidence of bowel obstruction.  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  Normal appendix.  Left lower quadrant colostomy.  Moderate left parastomal hernia containing fat and a loop of nondilated  small bowel (series 2/image 39).  Again seen is rectal wall thickening, likely mildly progressed (series 2/image 70), suspicious for recurrent tumor.  Adjacent 3.0 x 2.0 cm right perirectal node (series 2/image 61), previously 8 mm, compatible with nodal metastasis.  3.3 x 2.4 cm fluid collection arising from the left rectal wall (series 2/image 66).  Adjacent/contiguous 3.5 x 2.0 cm fluid collection in the left pelvis (series 2/image 60), with associated thin enhancing rim, worrisome for possible abscess.  Bilobed infrarenal abdominal aortic aneurysm measuring 3.4 and 3.7 cm in AP diameter.  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 Indwelling Foley catheter.  Prostate is unremarkable.  Direct extension of tumor into the right seminal vesicle is difficult to exclude (series  4/image 22).  Degenerative changes of the visualized thoracolumbar spine.  IMPRESSION: Recurrent rectal tumor with associated 3.0 x 2.0 cm right perirectal nodal metastasis.  Additional left pelvic/perirectal fluid collections, as described above, worrisome for possible small abscess.  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  Left lower quadrant colostomy.  Parastomal hernia containing fat and a loop of nondilated small bowel.  No evidence of bowel obstruction.  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 Indwelling Foley catheter.  Original Report Authenticated By: Charline Bills, M.D.    Scheduled Meds:    . ampicillin-sulbactam (UNASYN) IV  1.5 g Intravenous Q6H  . antiseptic oral rinse  15 mL Mouth Rinse q12n4p  . aspirin EC  81 mg Oral Daily  . chlorhexidine  15 mL Mouth Rinse BID  . digoxin  125 mcg Oral Daily  . ferrous sulfate  325 mg Oral Q breakfast  . Fluticasone-Salmeterol  1 puff Inhalation Q12H  . Ipratropium-Albuterol  1 puff Inhalation QID  . metFORMIN  500 mg Oral QHS  . omega-3 acid ethyl esters  1 g Oral Daily  . simvastatin  40 mg Oral QHS  . Warfarin - Pharmacist Dosing Inpatient   Does not apply q1800   Continuous Infusions:    . sodium chloride 20 mL/hr at 12/13/11 2139    Principal Problem:  *Abdominal pain Active Problems:  Rectal carcinoma  Anemia  Thrombocytopenia  Hyponatremia  Warfarin-induced coagulopathy  Weakness generalized    Time spent:    Kela Millin  [facility] Pager [phone]. If 8PM-8AM, please contact night-coverage at [email or web address], password [facility] 12/14/2011, 1:12 PM  LOS: 3 days

## 2011-12-15 ENCOUNTER — Inpatient Hospital Stay (HOSPITAL_COMMUNITY): Admission: RE | Admit: 2011-12-15 | Payer: Medicare Other | Source: Ambulatory Visit

## 2011-12-15 ENCOUNTER — Other Ambulatory Visit (HOSPITAL_BASED_OUTPATIENT_CLINIC_OR_DEPARTMENT_OTHER): Payer: Medicare Other

## 2011-12-15 ENCOUNTER — Other Ambulatory Visit: Payer: Self-pay

## 2011-12-15 ENCOUNTER — Ambulatory Visit: Payer: Medicare Other

## 2011-12-15 DIAGNOSIS — K921 Melena: Secondary | ICD-10-CM

## 2011-12-15 DIAGNOSIS — K6289 Other specified diseases of anus and rectum: Secondary | ICD-10-CM

## 2011-12-15 DIAGNOSIS — R109 Unspecified abdominal pain: Secondary | ICD-10-CM

## 2011-12-15 DIAGNOSIS — D649 Anemia, unspecified: Secondary | ICD-10-CM

## 2011-12-15 DIAGNOSIS — R1110 Vomiting, unspecified: Secondary | ICD-10-CM

## 2011-12-15 DIAGNOSIS — C20 Malignant neoplasm of rectum: Principal | ICD-10-CM

## 2011-12-15 LAB — HEMOGLOBIN AND HEMATOCRIT, BLOOD
HCT: 35.1 % — ABNORMAL LOW (ref 39.0–52.0)
HCT: 31.9 % — ABNORMAL LOW (ref 39.0–52.0)
Hemoglobin: 11.9 g/dL — ABNORMAL LOW (ref 13.0–17.0)

## 2011-12-15 LAB — CBC
HCT: 36 % — ABNORMAL LOW (ref 39.0–52.0)
Hemoglobin: 12.1 g/dL — ABNORMAL LOW (ref 13.0–17.0)
MCH: 31.6 pg (ref 26.0–34.0)
MCV: 94 fL (ref 78.0–100.0)
Platelets: 312 10*3/uL (ref 150–400)
RBC: 3.83 MIL/uL — ABNORMAL LOW (ref 4.22–5.81)
WBC: 12.8 10*3/uL — ABNORMAL HIGH (ref 4.0–10.5)

## 2011-12-15 LAB — BASIC METABOLIC PANEL
CO2: 28 mEq/L (ref 19–32)
Chloride: 96 mEq/L (ref 96–112)
Creatinine, Ser: 0.99 mg/dL (ref 0.50–1.35)
Glucose, Bld: 164 mg/dL — ABNORMAL HIGH (ref 70–99)

## 2011-12-15 LAB — LIPASE, BLOOD: Lipase: 20 U/L (ref 11–59)

## 2011-12-15 MED ORDER — METOCLOPRAMIDE HCL 5 MG/ML IJ SOLN
10.0000 mg | Freq: Three times a day (TID) | INTRAMUSCULAR | Status: DC
  Administered 2011-12-15 – 2011-12-28 (×50): 10 mg via INTRAVENOUS
  Filled 2011-12-15 (×56): qty 2

## 2011-12-15 MED ORDER — PHYTONADIONE 5 MG PO TABS
5.0000 mg | ORAL_TABLET | Freq: Once | ORAL | Status: AC
  Administered 2011-12-15: 5 mg via ORAL
  Filled 2011-12-15: qty 1

## 2011-12-15 MED ORDER — VITAMINS A & D EX OINT
TOPICAL_OINTMENT | CUTANEOUS | Status: AC
  Administered 2011-12-15: 15:00:00
  Filled 2011-12-15: qty 5

## 2011-12-15 MED ORDER — VITAMIN K1 10 MG/ML IJ SOLN
5.0000 mg | Freq: Once | INTRAMUSCULAR | Status: AC
  Administered 2011-12-15: 5 mg via SUBCUTANEOUS
  Filled 2011-12-15: qty 0.5

## 2011-12-15 MED ORDER — PROMETHAZINE HCL 25 MG/ML IJ SOLN
12.5000 mg | Freq: Four times a day (QID) | INTRAMUSCULAR | Status: DC | PRN
  Administered 2011-12-17: 12.5 mg via INTRAVENOUS
  Administered 2011-12-18: 6.25 mg via INTRAVENOUS
  Filled 2011-12-15 (×2): qty 1

## 2011-12-15 NOTE — Progress Notes (Signed)
CRITICAL VALUE ALERT  Critical value received:  INR 6.14  Date of notification:  12/15/11  Time of notification:  0445  Critical value read back:yes  Nurse who received alert:  Hedda Slade  MD notified (1st page):  Craige Cotta  Time of first page:  0525  MD notified (2nd page):  Time of second page:  Responding MD:  Craige Cotta  Time MD responded:  [phone]

## 2011-12-15 NOTE — Progress Notes (Signed)
Pt did not arrive for appointment.  Called and left message on home voice mail.  pof to scheduler

## 2011-12-15 NOTE — Progress Notes (Signed)
IP PROGRESS NOTE  Subjective:   Patient still C/O abdominal pain. Vomiting as well.  No improvement.   Objective:  Vital signs in last 24 hours: Temp:  [97.4 F (36.3 C)-98.7 F (37.1 C)] 98.7 F (37.1 C) (08/13 0700) Pulse Rate:  [87-94] 89  (08/13 0700) Resp:  [17-18] 18  (08/13 0700) BP: (116-146)/(68-77) 146/77 mmHg (08/13 0700) SpO2:  [90 %-97 %] 95 % (08/13 0724) Weight change:  Last BM Date: 12/14/11  Intake/Output from previous day: 08/12 0701 - 08/13 0700 In: 1149 [P.O.:360; I.V.:789] Out: 1601 [Urine:950; Stool:651]  Mouth: mucous membranes moist, pharynx normal without lesions Resp: clear to auscultation bilaterally Cardio: regular rate and rhythm, S1, S2 normal, no murmur, click, rub or gallop GI: soft, non-tender; bowel sounds normal; no masses,  no organomegaly. Tender on palpation.  Extremities: extremities normal, atraumatic, no cyanosis or edema  Portacath/PICC-without erythema  Lab Results:  Basename 12/15/11 0330 12/14/11 0340  WBC 12.8* 10.9*  HGB 12.1* 11.3*  HCT 36.0* 33.5*  PLT 312 237    BMET  Basename 12/15/11 0330 12/14/11 0340  NA 135 132*  K 3.8 3.8  CL 96 98  CO2 28 26  GLUCOSE 164* 142*  BUN 26* 21  CREATININE 0.99 1.03  CALCIUM 8.9 8.5    Studies/Results: No results found.  Medications: I have reviewed the patient's current medications.  Assessment/Plan:  This is a pleasant 78 year old gentleman with the following issues:   1. Advanced rectal carcinoma, presented with a T4 N2 disease in 2011. He presented with a complete obstruction. After segmental resection, he underwent radiation therapy with Xeloda and subsequently systemic chemotherapy. At this time, he has really no evidence of any recurrent disease, at least that is grossly evident. He has always been certainly at high risk of local recurrence, as well as systemic recurrence. His last CT chest, abdomen, and pelvis showed Recurrent rectal tumor with associated 3.0 x  2.0 cm right perirectal nodal metastasis. No diffuse disease noted.  He certainly could have carcinomatosis but I do not think a PET/CT would add anything at this point. He will need salvage chemotherapy in the neat future once  His clinical status stabilizes and preferably as out patient.   This could be considered as inpt if no improvement in the next few days.    2. Abdominal pain: this could be in addition to his cancer related to enteritis. I agree with supportive management like you are doing. I hope his pain will improve and he can tolerate po and can be discharged home and can treat his cancer as out patient if possible.    3. Port-A-Cath management will be Flushed today and every 6-8 weeks. Not being used now.   4 . Atrial fibrillation. INR levels are noted. Warfarin-induced coagulopathy being addressed by the primary team.   Will follow with you.         LOS: 4 days   Arwa Yero 12/15/2011, 8:06 AM

## 2011-12-15 NOTE — Progress Notes (Signed)
OT Cancellation Note  Treatment cancelled today due to medical issues with patient which prohibited therapy. Pt receiving plasma. INR still remains high at 6.14. Will check back another day.  Petrina Melby A OTR/L [phone] 12/15/2011, 3:05 PM

## 2011-12-15 NOTE — Consult Note (Signed)
Referring Provider: No ref. provider found Primary Care Physician:  Aura Dials, MD Primary Gastroenterologist:  Dr.  Jaquita Rector for Consultation:  Rectal bleeding  HPI: Vincent [name] is a 78 y.o. male who has a history of rectosigmoid adenoCA who underwent exploratory laparotomy and low anterior resection with incomplete resection of a large locally advanced rectal carcinoma in 2011.  He subsequently received chemo and radiation as well.  Patient presented to [facility] ED with complaints of abdominal pain on 12/11/2011.  CT scan showed the following:  Recurrent rectal tumor with associated 3.0 x 2.0 cm right perirectal nodal metastasis.  Additional left pelvic/perirectal fluid collections, worrisome for possible small abscess.  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  Left lower quadrant colostomy. Parastomal hernia containing fat and a loop of nondilated small bowel. No evidence of bowel obstruction.  While he was here he was he was noted to have rectal bleeding.  Patient states that he has been seeing blood on and off for a couple of months mixed with mucus, however, it has worsened within the last couple of weeks.  Says that there are clots at times.  He is on coumadin, and his INR has been supratherapeutic for the last few days; is going to receive FFP and Vitamin K to reverse.  Hgb is stable at 12.1 grams, actually increased slightly without transfusion.  Complains of diffuse abdominal pain, but greatest in the LLQ; cannot even stand up due to the pain.  Positive nausea.  Patient is on Unasyn with regards to the possible abscesses.  According to surgery and oncology notes, there is no surgery planned at this time, but there is plan for salvage chemotherapy, according to Dr. Alver Fisher notes.   Past Medical History  Diagnosis Date  . Hypertension   . Arthritis   . Heart disease   . SOB (shortness of breath)   . Malignant neoplasm of hepatic  flexure   . Cancer     and/or colon polyp  . Malignant neoplasm of rectosigmoid junction   . Diabetes mellitus     Past Surgical History  Procedure Date  . Back surgery [phone]  . Arthroscopic knee 1992  . 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 Exploratory laparotomy 02/2010  . Rectosigmoid colon resection 02/2010  . Colostomy 02/2010  . Drainage of intra-abdominal abscess 02/2010    Prior to Admission medications   Medication Sig Start Date End Date Taking? Authorizing Provider  albuterol (PROAIR HFA) 108 (90 BASE) MCG/ACT inhaler Inhale 2 puffs into the lungs every 4 (four) hours as needed. WHEEZING   Yes Historical Provider, MD  albuterol-ipratropium (COMBIVENT) 18-103 MCG/ACT inhaler Inhale 1 puff into the lungs 4 (four) times daily. WHEEZING   Yes Historical Provider, MD  aspirin 81 MG tablet Take 81 mg by mouth daily.     Yes Historical Provider, MD  digoxin (LANOXIN) 0.125 MG tablet Take 125 mcg by mouth daily.     Yes Historical Provider, MD  fish oil-omega-3 fatty acids 1000 MG capsule Take 1 g by mouth daily.     Yes Historical Provider, MD  Fluticasone-Salmeterol (ADVAIR DISKUS) 500-50 MCG/DOSE AEPB Inhale 1 puff into the lungs every 12 (twelve) hours.   Yes Historical Provider, MD  metFORMIN (GLUCOPHAGE) 500 MG tablet Take 500 mg by mouth at bedtime.   Yes Historical Provider, MD  simvastatin (ZOCOR) 40 MG tablet Take 40 mg by mouth at bedtime.     Yes Historical  Provider, MD  warfarin (COUMADIN) 5 MG tablet Take 2.5-5 mg by mouth daily. Pt takes 5 mg every day except on Monday and Friday pt takes 2.5mg  ( 1/2 tab of 5 mg tablet)   Yes Historical Provider, MD    Current Facility-Administered Medications  Medication Dose Route Frequency Provider Last Rate Last Dose  . 0.9 %  sodium chloride infusion   Intravenous Continuous Kela Millin, MD 50 mL/hr at 12/15/11 1052    . acetaminophen (TYLENOL) tablet 650 mg  650 mg Oral Q6H PRN Ron Parker, MD       Or  .  acetaminophen (TYLENOL) suppository 650 mg  650 mg Rectal Q6H PRN Harvette Velora Heckler, MD      . albuterol (PROVENTIL HFA;VENTOLIN HFA) 108 (90 BASE) MCG/ACT inhaler 2 puff  2 puff Inhalation Q4H PRN Ron Parker, MD      . alum & mag hydroxide-simeth (MAALOX/MYLANTA) 200-200-20 MG/5ML suspension 30 mL  30 mL Oral Q6H PRN Ron Parker, MD   30 mL at 12/14/11 0542  . ampicillin-sulbactam (UNASYN) 1.5 g in sodium chloride 0.9 % 50 mL IVPB  1.5 g Intravenous Q6H Ron Parker, MD   1.5 g at 12/15/11 0522  . antiseptic oral rinse (BIOTENE) solution 15 mL  15 mL Mouth Rinse q12n4p Adeline C Viyuoh, MD   15 mL at 12/13/11 1252  . aspirin EC tablet 81 mg  81 mg Oral Daily Ron Parker, MD   81 mg at 12/15/11 1052  . chlorhexidine (PERIDEX) 0.12 % solution 15 mL  15 mL Mouth Rinse BID Kela Millin, MD   15 mL at 12/15/11 0808  . digoxin (LANOXIN) tablet 125 mcg  125 mcg Oral Daily Ron Parker, MD   125 mcg at 12/15/11 1052  . ferrous sulfate tablet 325 mg  325 mg Oral Q breakfast Kela Millin, MD   325 mg at 12/15/11 0808  . Fluticasone-Salmeterol (ADVAIR) 500-50 MCG/DOSE inhaler 1 puff  1 puff Inhalation Q12H Ron Parker, MD   1 puff at 12/15/11 0723  . HYDROmorphone (DILAUDID) injection 0.5-1 mg  0.5-1 mg Intravenous Q3H PRN Ron Parker, MD   1 mg at 12/15/11 1047  . Ipratropium-Albuterol (COMBIVENT) respimat 1 puff  1 puff Inhalation Q6H PRN Hollice Espy, MD      . Ipratropium-Albuterol (COMBIVENT) respimat 1 puff  1 puff Inhalation QID Kela Millin, MD   1 puff at 12/15/11 0723  . metFORMIN (GLUCOPHAGE) tablet 500 mg  500 mg Oral QHS Ron Parker, MD   500 mg at 12/14/11 2105  . metoCLOPramide (REGLAN) injection 10 mg  10 mg Intravenous TID AC & HS Adeline C Viyuoh, MD      . omega-3 acid ethyl esters (LOVAZA) capsule 1 g  1 g Oral Daily Ron Parker, MD   1 g at 12/13/11 1025  . ondansetron (ZOFRAN) tablet 4 mg  4 mg Oral Q6H PRN  Ron Parker, MD       Or  . ondansetron (ZOFRAN) injection 4 mg  4 mg Intravenous Q6H PRN Ron Parker, MD   4 mg at 12/15/11 0853  . oxyCODONE (Oxy IR/ROXICODONE) immediate release tablet 5 mg  5 mg Oral Q4H PRN Ron Parker, MD   5 mg at 12/13/11 1836  . phytonadione (VITAMIN K) SQ injection 5 mg  5 mg Subcutaneous Once Kela Millin, MD   5 mg at 12/15/11 1048  .  phytonadione (VITAMIN K) tablet 5 mg  5 mg Oral Once Caroline More, NP   5 mg at 12/15/11 0542  . promethazine (PHENERGAN) injection 12.5 mg  12.5 mg Intravenous Q6H PRN Adeline C Viyuoh, MD      . simvastatin (ZOCOR) tablet 40 mg  40 mg Oral QHS Ron Parker, MD   40 mg at 12/14/11 2105  . Warfarin - Pharmacist Dosing Inpatient   Does not apply q1800 Hollice Espy, MD      . zolpidem (AMBIEN) tablet 5 mg  5 mg Oral QHS PRN Ron Parker, MD      . DISCONTD: metoCLOPramide (REGLAN) injection 5 mg  5 mg Intravenous TID AC & HS Adeline C Viyuoh, MD   5 mg at 12/15/11 0809    Allergies as of 12/11/2011  . (No Known Allergies)    History reviewed. No pertinent family history.  History   Social History  . Marital Status: Married    Spouse Name: N/A    Number of Children: N/A  . Years of Education: N/A   Occupational History  . Not on file.   Social History Main Topics  . Smoking status: Never Smoker   . Smokeless tobacco: Never Used  . Alcohol Use: No  . Drug Use: No  . Sexually Active: Not on file   Other Topics Concern  . Not on file   Social History Narrative  . No narrative on file    Review of Systems otherwise negative except as mentioned in HPI.  Physical Exam: Vital signs in last 24 hours: Temp:  [97.4 F (36.3 C)-98.7 F (37.1 C)] 97.9 F (36.6 C) (08/13 0925) Pulse Rate:  [78-94] 78  (08/13 0925) Resp:  [17-18] 18  (08/13 0925) BP: (116-148)/(68-77) 148/77 mmHg (08/13 0925) SpO2:  [92 %-97 %] 96 % (08/13 0925) Last BM Date: 12/14/11 General:   Alert,   Well-developed, well-nourished, pleasant and cooperative in NAD; appears uncomfortable and tired Head:  Normocephalic and atraumatic. Eyes:  Sclera clear, no icterus.   Conjunctiva pink. Ears:  Normal auditory acuity. Mouth:  No deformity or lesions.   Lungs:  Clear throughout to auscultation.   No wheezes, crackles, or rhonchi. Heart:  Regular rate and rhythm; no murmurs, clicks, rubs, or gallops. Abdomen:  Soft, nondistended.  Diffuse TTP > in the LLQ, BS active, nonpalp mass or hsm.  Colostomy noted in the LLQ with soft brown stool, no blood.  Laparotomy scar noted.  Rectal:  No external abnormalities.  DRE revealed extreme tenderness.  Mass felt.  Trace/minimal specks of blood on glove. Msk:  Symmetrical without gross deformities. . Pulses:  Normal pulses noted. Extremities:  Without clubbing or edema. Neurologic:  Alert and  oriented x4;  grossly normal neurologically. Skin:  Intact without significant lesions or rashes.. Psych:  Alert and cooperative. Normal mood and affect.  Intake/Output from previous day: 08/12 0701 - 08/13 0700 In: 1149 [P.O.:360; I.V.:789] Out: 1601 [Urine:950; Stool:651] Intake/Output this shift: Total I/O In: 0  Out: 200 [Stool:200]  Lab Results: Vit B12 206, iron 18, % sat 8, CEA pending, TIBC, ferritin, and folate WNL's  Basename 12/15/11 0330 12/14/11 0340 12/13/11 0355  WBC 12.8* 10.9* 11.9*  HGB 12.1* 11.3* 11.7*  HCT 36.0* 33.5* 34.6*  PLT 312 237 205   BMET  Basename 12/15/11 0330 12/14/11 0340 12/13/11 0355  NA 135 132* 130*  K 3.8 3.8 4.1  CL 96 98 97  CO2 28 26 24  GLUCOSE 164* 142* 160*  BUN 26* 21 22  CREATININE 0.99 1.03 1.09  CALCIUM 8.9 8.5 8.2*   PT/INR  Basename 12/15/11 0330 12/14/11 0340  LABPROT 55.3* 50.4*  INR 6.14* 5.45*    IMPRESSION:  1.  Abdominal pain-likely multifactorial due to the several abdominal abnormalities seen on CT scan 2.  History of rectosigmoid adenoCA s/p incomplete resection with colostomy;  apparent recurrence of tumor on recent CT scan; CEA pending 3.  Rectal bleeding-likely secondary to #2; rectal exam did not reveal any significant bleeding currently 4.  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  Perirectal fluid collections worrisome for abscesses seen on CT-patient is on Unasyn 6.  Anemia-Hgb has improved without transfusion 7.  Coumadin coagulopathy with supratherapeutic INR of 6.14 today-receiving FFP and vitamin K 8.  Afib  PLAN: 1.  Pain control, IVF's, antiemetics, and other symptomatic care. 2.  Monitor labs including blood counts and INR. 3.  Agree with antibiotics; is on Unasyn. 4.  Will plan for flex sig tomorrow if INR is ok in the morning to see if there is anything amenable to endoscopic therapy, ie APC, etc.   Brynn Mulgrew D.  12/15/2011, 10:57 AM

## 2011-12-15 NOTE — Progress Notes (Signed)
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 -Lipase done and within normal limits Active Problems:  Rectal carcinoma with recurrence - CEA still peding- follow, surgery suspecting developing carcinomatosis -onc consulted, dicussed pt with Vincent [name] and she recommends holding off PET while inpt -pt was scheduled for CT abd and chest on 8/13 per Vincent [name], CT abd done and results as above, CT chest with no evidence of metastatic dz- onc/Vincent [name] to follow -Appreciate Vincent. Alver [name] input-to follow and decide timing of chemotherapy. Rectal bleeding -Likely secondary to #1 in setting of supratherapeutic INR -Will give fresh frozen and  vitamin K  -Have consulted GI for further recommendations-patient seen by Vincent [name] GI in the past the -Increase IV fluids for hydration Anemia, Fe deficiency -hgb stable for now, will monitor q. 8 today  and transfuse PRBCs as appropriate -continue supplemental Fe H/o PAF -On chronic coumadin, INR Still supratherapeutic  -Will DC Coumadin for now, and with the rectal bleeding as above FFP and vitamin K follow  ?intraabdominal abscess  -per CT, on empiric ABX-unasyn - Appreciate surgery input- little to offer from surgical standpoint,  -Discussed with surgery and recommend antibiotics if patient has a white cell count or fever. He does have leukocytosis following admission-so will continue empiric antibiotics-10-14 day total Thrombocytopenia  -plt count improved and remains stable Hyponatremia  -vol  depletion vs SIADH -resolved, follow Warfarin-induced coagulopathy  -INR is still trending up despite holding Coumadin, no gross bleeding and hemoglobin stable.  -Discussed above in setting of rectal bleeding today and will give fresh frozen and vitamin K follow. Weakness generalized -likely secondary to above Code Status: full Family Communication: wife via [phone]) Disposition Plan: to home when medically stable   Brief narrative: The pt is [name] with a history of Rectal Cancer diagnosed in 02/2010 S/P surgical resection and colostomy along with Chemotherapy and Radiation treatments who presents with 2 weeks of worsening diffuse ABD Pain along with generalized weakness.  He was evaluated in the ED and found to found to have recurrence of cancer on Ct scan of the ABD and Pelvis. He was  Admitted for further evaluation and management.  General Surgery/Vincent [name] was consulted   Consultants:  Surgery- Vincent [name]  Oncology- Clelia [name]  Procedures:  none  Antibiotics:  unasyn  HPI/Subjective: Per nursing staff rectal bleeding today with blood clots-about 20-30 cc on underpads, nurse also states had some rectal bleeding yesterday. No blood reported in colostomy bag. Patient still with nausea vomiting and abdominal pain. Objective: Filed Vitals:   12/15/11 0150 12/15/11 0700 12/15/11 0724 12/15/11 0925  BP: 136/73 146/77  148/77  Pulse: 94 89  78  Temp: 97.8 F (36.6 C) 98.7 F (37.1 C)  97.9 F (36.6 C)  TempSrc: Oral Oral  Oral  Resp: 18 18  18   Height:      Weight:      SpO2: 92% 97% 95% 96%    Intake/Output Summary (Last 24 hours) at 12/15/11 1033 Last data filed at 12/15/11 0800  Gross per 24 hour  Intake   1029 ml  Output   1801 ml  Net   -  772 ml   Filed Weights   12/11/11 2124  Weight: 91.445 kg (201 lb 9.6 oz)    Exam:   General:  Alert in no apparent distress  Cardiovascular: RRR, nl S1s2  Respiratory:decreased Bs at bases  Abdomen:  soft, +Bs, tenderness present in lower, also left upper quadrant tenderness .+costomy with brown stool on L.abd-no gross blood in bag  EXT: no cyanosis, no edema  Data Reviewed: Basic Metabolic Panel:  Lab 12/15/11 1610 12/14/11 0340 12/13/11 0355 12/12/11 0408 12/11/11 1423  NA 135 132* 130* 130* 132*  K 3.8 3.8 4.1 4.0 3.7  CL 96 98 97 98 96  CO2 28 26 24 25 27   GLUCOSE 164* 142* 160* 148* 124*  BUN 26* 21 22 20 21   CREATININE 0.99 1.03 1.09 1.03 1.08  CALCIUM 8.9 8.5 8.2* 8.4 9.0  MG -- -- -- -- --  PHOS -- -- -- -- --   Liver Function Tests:  Lab 12/11/11 1423  AST 25  ALT 22  ALKPHOS 74  BILITOT 0.7  PROT 6.7  ALBUMIN 2.8*    Lab 12/15/11 0330  LIPASE 20  AMYLASE --   No results found for this basename: AMMONIA:5 in the last 168 hours CBC:  Lab 12/15/11 0330 12/14/11 0340 12/13/11 0355 12/12/11 0408 12/11/11 1423  WBC 12.8* 10.9* 11.9* 11.8* 10.2  NEUTROABS -- -- -- -- 8.4*  HGB 12.1* 11.3* 11.7* 10.8* 11.1*  HCT 36.0* 33.5* 34.6* 32.2* 31.8*  MCV 94.0 95.2 94.5 95.5 93.5  PLT 312 237 205 184 157   Cardiac Enzymes: No results found for this basename: CKTOTAL:5,CKMB:5,CKMBINDEX:5,TROPONINI:5 in the last 168 hours BNP (last 3 results) No results found for this basename: PROBNP:3 in the last 8760 hours CBG: No results found for this basename: GLUCAP:5 in the last 168 hours  Recent Results (from the past 240 hour(s))  URINE CULTURE     Status: Normal   Collection Time   12/11/11  2:24 PM      Component Value Range Status Comment   Specimen Description URINE, RANDOM   Final    Special Requests NONE   Final    Culture  Setup Time 12/12/2011 02:01   Final    Colony Count 8,000 COLONIES/ML   Final    Culture INSIGNIFICANT GROWTH   Final    Report Status 12/13/2011 FINAL   Final      Studies: Dg Chest 2 View  12/11/2011  *RADIOLOGY REPORT*  Clinical Data: Shortness of breath.  Generalized weakness. Abdominal pain.  History of colorectal cancer.  CHEST - 2  VIEW  Comparison: CT chest 04/09/2011, 10/20/2010, and portable chest x- ray 06/04/2010.  Findings: Cardiac silhouette normal in size, unchanged.  Thoracic aorta mildly atherosclerotic, unchanged.  Hilar and mediastinal contours otherwise unremarkable.  Lungs mildly hyperinflated but clear.  Bronchovascular markings normal.  No pleural effusions. Degenerative changes involving the thoracic spine.  Left subclavian Port-A-Cath tip in the mid SVC.  Degenerative changes involving the right shoulder.  IMPRESSION: Mild hyperinflation consistent with COPD and/or asthma.  No acute cardiopulmonary disease.  Stable examination.  Original Report Authenticated By: Arnell Sieving, M.D.   Ct Abdomen Pelvis W Contrast  12/11/2011  *RADIOLOGY REPORT*  Clinical Data: Abdominal pain, hematuria, history of colon cancer status post resection in 2011  CT ABDOMEN AND PELVIS WITH CONTRAST  Technique:  Multidetector CT imaging of the abdomen and pelvis was performed following the standard protocol during bolus administration of intravenous contrast.  Contrast:  OMNIPAQUE IOHEXOL 300 MG/ML  SOLN  Comparison: 04/09/2011  Findings: Lung bases are clear.  Multiple probable hepatic cysts, unchanged.  Spleen, pancreas, and adrenal glands within normal limits.  Gallbladder is unremarkable.  No intrahepatic or extrahepatic ductal dilatation.  1.4 cm posterior interpolar left renal cyst (series 2/image 27). Right kidney is unremarkable.  No hydronephrosis.  No evidence of bowel obstruction.  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  Normal appendix.  Left lower quadrant colostomy.  Moderate left parastomal hernia containing fat and a loop of nondilated small bowel (series 2/image 39).  Again seen is rectal wall thickening, likely mildly progressed (series 2/image 70), suspicious for recurrent tumor.  Adjacent 3.0 x 2.0 cm right perirectal node (series  2/image 61), previously 8 mm, compatible with nodal metastasis.  3.3 x 2.4 cm fluid collection arising from the left rectal wall (series 2/image 66).  Adjacent/contiguous 3.5 x 2.0 cm fluid collection in the left pelvis (series 2/image 60), with associated thin enhancing rim, worrisome for possible abscess.  Bilobed infrarenal abdominal aortic aneurysm measuring 3.4 and 3.7 cm in AP diameter.  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 Indwelling Foley catheter.  Prostate is unremarkable.  Direct extension of tumor into the right seminal vesicle is difficult to exclude (series 4/image 22).  Degenerative changes of the visualized thoracolumbar spine.  IMPRESSION: Recurrent rectal tumor with associated 3.0 x 2.0 cm right perirectal nodal metastasis.  Additional left pelvic/perirectal fluid collections, as described above, worrisome for possible small abscess.  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  Left lower quadrant colostomy.  Parastomal hernia containing fat and a loop of nondilated small bowel.  No evidence of bowel obstruction.  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 Indwelling Foley catheter.  Original Report Authenticated By: Charline Bills, M.D.    Scheduled Meds:    . ampicillin-sulbactam (UNASYN) IV  1.5 g Intravenous Q6H  . antiseptic oral rinse  15 mL Mouth Rinse q12n4p  . aspirin EC  81 mg Oral Daily  . chlorhexidine  15 mL Mouth Rinse BID  . digoxin  125 mcg Oral Daily  . ferrous sulfate  325 mg Oral Q breakfast  . Fluticasone-Salmeterol  1 puff Inhalation Q12H  . Ipratropium-Albuterol  1 puff Inhalation QID  . metFORMIN  500 mg Oral QHS  . metoCLOPramide (REGLAN) injection  10 mg Intravenous TID AC & HS  . omega-3 acid ethyl esters  1 g Oral Daily  . phytonadione  5 mg Subcutaneous Once  . phytonadione  5 mg Oral Once  . simvastatin  40 mg Oral QHS  . Warfarin -  Pharmacist Dosing Inpatient   Does not apply q1800  . DISCONTD: metoCLOPramide (REGLAN) injection  5 mg Intravenous TID AC & HS   Continuous Infusions:    . sodium chloride 20 mL/hr at 12/13/11 2139    Principal Problem:  *Abdominal pain Active Problems:  Rectal carcinoma  Anemia  Thrombocytopenia  Hyponatremia  Warfarin-induced coagulopathy  Weakness generalized    Time spent:    [facility]  [facility] Pager [phone]. If 8PM-8AM, please contact night-coverage at [email or web address], password [facility] 12/15/2011, 10:33 AM  LOS: 4 days

## 2011-12-15 NOTE — Progress Notes (Signed)
ANTICOAGULATION CONSULT NOTE  Pharmacy Consult for warfarin Indication: VTE treatment  No Known Allergies  Patient Measurements: Height: 5\' 11"  (180.3 cm) Weight: 201 lb 9.6 oz (91.445 kg) IBW/kg (Calculated) : 75.3    Vital Signs: Temp: 97.9 F (36.6 C) (08/13 0925) Temp src: Oral (08/13 0925) BP: 148/77 mmHg (08/13 0925) Pulse Rate: 78  (08/13 0925)  Labs:  Basename 12/15/11 0330 12/14/11 0340 12/13/11 0355  HGB 12.1* 11.3* --  HCT 36.0* 33.5* 34.6*  PLT 312 237 205  APTT -- -- --  LABPROT 55.3* 50.4* 42.0*  INR 6.14* 5.45* 4.32*  HEPARINUNFRC -- -- --  CREATININE 0.99 1.03 1.09  CKTOTAL -- -- --  CKMB -- -- --  TROPONINI -- -- --   Estimated Creatinine Clearance: 73.4 ml/min (by C-G formula based on Cr of 0.99).   Medications:     . ampicillin-sulbactam (UNASYN) IV  1.5 g Intravenous Q6H  . antiseptic oral rinse  15 mL Mouth Rinse q12n4p  . aspirin EC  81 mg Oral Daily  . chlorhexidine  15 mL Mouth Rinse BID  . digoxin  125 mcg Oral Daily  . ferrous sulfate  325 mg Oral Q breakfast  . Fluticasone-Salmeterol  1 puff Inhalation Q12H  . Ipratropium-Albuterol  1 puff Inhalation QID  . metFORMIN  500 mg Oral QHS  . metoCLOPramide (REGLAN) injection  10 mg Intravenous TID AC & HS  . omega-3 acid ethyl esters  1 g Oral Daily  . phytonadione  5 mg Subcutaneous Once  . phytonadione  5 mg Oral Once  . simvastatin  40 mg Oral QHS  . Warfarin - Pharmacist Dosing Inpatient   Does not apply q1800  . DISCONTD: metoCLOPramide (REGLAN) injection  5 mg Intravenous TID AC & HS     Assessment:  78 yo M admit with abdominal pain, history of rectal cancer with recurrence and possible intra-abdominal abscess seen on CT scan.  PTA on chronic warfarin 5 mg daily, except on Monday and Friday 2.5mg   Had reports of rectal bleeding on admission, no further reports of bleeding  H/H stable  INR was supratherapeutic, continues to increase despite holding warfarin. (Last dose  8/10 @0012 , 2.5mg )  Potential drug interaction with Unasyn (may increase INR)  Goal of Therapy:  INR 2-3   Plan:   Hold warfarin today  Daily PT/INR  Tammy Sours, Pharm.D. Clinical Oncology Pharmacist  Pager # [phone]  12/15/2011 10:21 AM '

## 2011-12-15 NOTE — Progress Notes (Signed)
Patient ID: Vincent [name], male   DOB: December 30, 1932, 78 y.o.   MRN: [id number]  General Surgery - [facility] - Progress Note  Subjective: Patient up in bed.  Complains of lower abdominal pain.  Objective: Vital signs in last 24 hours: Temp:  [97.4 F (36.3 C)-98.7 F (37.1 C)] 98.7 F (37.1 C) (08/13 0700) Pulse Rate:  [87-94] 89  (08/13 0700) Resp:  [17-18] 18  (08/13 0700) BP: (116-146)/(68-77) 146/77 mmHg (08/13 0700) SpO2:  [90 %-97 %] 95 % (08/13 0724) Last BM Date: 12/14/11  Intake/Output from previous day: 08/12 0701 - 08/13 0700 In: 1149 [P.O.:360; I.V.:789] Out: 1601 [Urine:950; Stool:651]  Exam: HEENT - clear, not icteric Neck - soft Chest - clear bilaterally Cor - RRR, no murmur Abd - soft, mild distension; parastomal hernia, reducible, soft, non-tender; mild bilat lower quadrant tenderness; liquid stool in ostomy bag; no significant midline hernia Ext - no significant edema Neuro - grossly intact, no focal deficits  Lab Results:   Basename 12/15/11 0330 12/14/11 0340  WBC 12.8* 10.9*  HGB 12.1* 11.3*  HCT 36.0* 33.5*  PLT 312 237     Basename 12/15/11 0330 12/14/11 0340  NA 135 132*  K 3.8 3.8  CL 96 98  CO2 28 26  GLUCOSE 164* 142*  BUN 26* 21  CREATININE 0.99 1.03  CALCIUM 8.9 8.5    Studies/Results: No results found.  Assessment / Plan: 1.  Metastatic rectal carcinoma  - on Unasyn empirically for pelvic 3 cm fluid collection - ? Abscess versus necrotic tumor  - abdominal pain - ? Etiology - present for a few months now  - consider repeat CT abdomen after 5-7 days on abx Rx  - plans for adjuvant chemotherapy per medical oncology  - will follow - no role for surgical intervention at present  Velora Heckler, MD, [facility] Office: [phone]  12/15/2011

## 2011-12-15 NOTE — Consult Note (Signed)
Patient seen, examined, and I agree with the above documentation, including the assessment and plan. Patient with rectal bleeding which is likely bleeding from his known rectal cancer. CT shows complication with abscess is which is likely contributing to his abdominal pain. Also there is enteritis which is likely radiation-induced, also likely cause of pain. He is on IV antibiotics, and at this point none of these collections look drainable. Surgery has seen the patient For now we need to correct his INR which will likely be the best therapy to prevent bleeding. We can perform flexible sigmoidoscopy to examine the rectal stump, but endoscopic therapy likely limited if this is indeed tumor bleeding Pain control, anti-medics, and other supportive care as you are

## 2011-12-16 ENCOUNTER — Encounter (HOSPITAL_COMMUNITY): Payer: Self-pay | Admitting: *Deleted

## 2011-12-16 ENCOUNTER — Encounter (HOSPITAL_COMMUNITY)
Admission: EM | Disposition: A | Discharge status: Home Health | Payer: Self-pay | Source: Home / Self Care | Attending: Internal Medicine

## 2011-12-16 DIAGNOSIS — D689 Coagulation defect, unspecified: Secondary | ICD-10-CM

## 2011-12-16 DIAGNOSIS — T50904A Poisoning by unspecified drugs, medicaments and biological substances, undetermined, initial encounter: Secondary | ICD-10-CM

## 2011-12-16 DIAGNOSIS — T50901A Poisoning by unspecified drugs, medicaments and biological substances, accidental (unintentional), initial encounter: Secondary | ICD-10-CM

## 2011-12-16 LAB — CBC
Hemoglobin: 11.5 g/dL — ABNORMAL LOW (ref 13.0–17.0)
Platelets: 278 10*3/uL (ref 150–400)
RDW: 14.3 % (ref 11.5–15.5)

## 2011-12-16 LAB — BASIC METABOLIC PANEL
BUN: 26 mg/dL — ABNORMAL HIGH (ref 6–23)
CO2: 28 mEq/L (ref 19–32)
Chloride: 102 mEq/L (ref 96–112)
Creatinine, Ser: 0.93 mg/dL (ref 0.50–1.35)
GFR calc Af Amer: >90 mL/min (ref >90)
Potassium: 3.6 mEq/L (ref 3.5–5.1)

## 2011-12-16 LAB — PROTIME-INR: Prothrombin Time: 30.1 seconds — ABNORMAL HIGH (ref 11.6–15.2)

## 2011-12-16 SURGERY — SIGMOIDOSCOPY, FLEXIBLE
Anesthesia: Moderate Sedation

## 2011-12-16 NOTE — Progress Notes (Signed)
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 -Lipase done and within normal limits - beginning to improve, follw Active Problems:  Rectal carcinoma with recurrence - CEA still peding- follow, surgery suspecting developing carcinomatosis -onc consulted, dicussed pt with Dr Darrold Span and she recommends holding off PET while inpt -pt was scheduled for CT abd and chest on 8/13 per Dr Clelia Croft, CT abd done and results as above, CT chest with no evidence of metastatic dz- onc/Dr Clelia Croft to follow -Appreciate Dr. Alver Fisher input-planning chemo as outpt. Rectal bleeding -Likely secondary to #1 in setting of supratherapeutic INR -s/p fresh frozen and  vitamin K on 8/13 -appreciate GI input, continue suppotrive care -hgb stable, follow Anemia, Fe deficiency -hgb stable for now, follow and transfuse PRBCs as appropriate -continue supplemental Fe H/o PAF -off Coumadin for now, s/p FFP and vitamin K follow and INR now down to 2.82 ?intraabdominal abscess  -per CT, continue empiric ABX-unasyn - Appreciate surgery input- little to offer from surgical standpoint,  -Discussed with surgery and recommend antibiotics if patient has a white cell count or fever. He does have leukocytosis following admission-so will continue empiric antibiotics-10-14 day total Thrombocytopenia  -plt count improved and remains stable Hyponatremia  -vol depletion vs SIADH -resolved, follow Warfarin-induced coagulopathy  -INR now down to 2.82 s/p coumadin reversal on 8/13 as above -?appropriateness of chronic  coumadin in this pt to be discussed with pt's cardiologist as well as GI and onc per rounding hospitalist  Weakness generalized -likely secondary to above Code Status: full Family Communication: wife via [phone]) Disposition Plan: to home when medically stable   Brief narrative: The pt is [name] with a history of Rectal Cancer diagnosed in 02/2010 S/P surgical resection and colostomy along with Chemotherapy and Radiation treatments who presents with 2 weeks of worsening diffuse ABD Pain along with generalized weakness.  He was evaluated in the ED and found to found to have recurrence of cancer on Ct scan of the ABD and Pelvis. He was  Admitted for further evaluation and management.  General Surgery/Dr Vincent [name] was consulted   Consultants:  Surgery- Gerkin  Oncology- Vincent [name]  GI- Dr Vincent [name]  Procedures:  none  Antibiotics:  unasyn  HPI/Subjective: States he feels better today- no further n/v or rectal bleeding. Decreased abd. Pain and tolerating clears today. Objective: Filed Vitals:   12/16/11 0900 12/16/11 1225 12/16/11 1421 12/16/11 1800  BP: 136/53  128/76 134/72  Pulse: 78  78 89  Temp:   97.5 F (36.4 C) 98.2 F (36.8 C)  TempSrc:   Oral Oral  Resp:   18 18  Height:      Weight:      SpO2:  97% 98% 97%    Intake/Output Summary (Last 24 hours) at 12/16/11 2045 Last data filed at 12/16/11 1853  Gross per 24 hour  Intake 2038.1 ml  Output   1250 ml  Net  788.1 ml   Filed Weights   12/11/11 2124  Weight: 91.445 kg (201 lb 9.6 oz)    Exam:   General:  Alert in no apparent distress  Cardiovascular: RRR, nl S1s2  Respiratory:decreased Bs at bases  Abdomen: soft, +Bs, decreased tenderness  in lower, also left upper quadrant tenderness .+costomy with brown stool on L.abd-no gross blood in bag  EXT: no cyanosis, no edema  Data Reviewed: Basic Metabolic Panel:  Lab 12/16/11 1610 12/15/11 0330 12/14/11 0340 12/13/11 0355 12/12/11 0408  NA  138 135 132* 130* 130*  K 3.6 3.8 3.8 4.1 4.0  CL 102 96 98 97 98  CO2 28 28 26 24 25   GLUCOSE 142* 164* 142* 160* 148*  BUN 26* 26* 21 22 20   CREATININE 0.93 0.99 1.03 1.09 1.03  CALCIUM 8.6 8.9 8.5 8.2* 8.4  MG -- -- -- -- --  PHOS -- -- -- -- --   Liver Function Tests:  Lab 12/11/11 1423  AST 25  ALT 22  ALKPHOS 74  BILITOT 0.7  PROT 6.7  ALBUMIN 2.8*    Lab 12/15/11 0330  LIPASE 20  AMYLASE --   No results found for this basename: AMMONIA:5 in the last 168 hours CBC:  Lab 12/16/11 0333 12/15/11 1956 12/15/11 1120 12/15/11 0330 12/14/11 0340 12/13/11 0355 12/12/11 0408 12/11/11 1423  WBC 7.7 -- -- 12.8* 10.9* 11.9* 11.8* --  NEUTROABS -- -- -- -- -- -- -- 8.4*  HGB 11.5* 10.8* 11.9* 12.1* 11.3* -- -- --  HCT 35.0* 31.9* 35.1* 36.0* 33.5* -- -- --  MCV 95.4 -- -- 94.0 95.2 94.5 95.5 --  PLT 278 -- -- 312 237 205 184 --   Cardiac Enzymes: No results found for this basename: CKTOTAL:5,CKMB:5,CKMBINDEX:5,TROPONINI:5 in the last 168 hours BNP (last 3 results) No results found for this basename: PROBNP:3 in the last 8760 hours CBG: No results found for this basename: GLUCAP:5 in the last 168 hours  Recent Results (from the past 240 hour(s))  URINE CULTURE     Status: Normal   Collection Time   12/11/11  2:24 PM      Component Value Range Status Comment   Specimen Description URINE, RANDOM   Final    Special Requests NONE   Final    Culture  Setup Time 12/12/2011 02:01   Final    Colony Count 8,000 COLONIES/ML   Final    Culture INSIGNIFICANT GROWTH   Final    Report Status 12/13/2011 FINAL   Final      Studies: Dg Chest 2 View  12/11/2011  *RADIOLOGY REPORT*  Clinical Data: Shortness of breath.  Generalized weakness. Abdominal pain.  History of colorectal cancer.  CHEST - 2 VIEW  Comparison: CT chest 04/09/2011, 10/20/2010, and portable chest x- ray 06/04/2010.  Findings: Cardiac silhouette normal in size, unchanged.  Thoracic aorta mildly atherosclerotic,  unchanged.  Hilar and mediastinal contours otherwise unremarkable.  Lungs mildly hyperinflated but clear.  Bronchovascular markings normal.  No pleural effusions. Degenerative changes involving the thoracic spine.  Left subclavian Port-A-Cath tip in the mid SVC.  Degenerative changes involving the right shoulder.  IMPRESSION: Mild hyperinflation consistent with COPD and/or asthma.  No acute cardiopulmonary disease.  Stable examination.  Original Report Authenticated By: Arnell Sieving, M.D.   Ct Abdomen Pelvis W Contrast  12/11/2011  *RADIOLOGY REPORT*  Clinical Data: Abdominal pain, hematuria, history of colon cancer status post resection in 2011  CT ABDOMEN AND PELVIS WITH CONTRAST  Technique:  Multidetector CT imaging of the abdomen and pelvis was performed following the standard protocol during bolus administration of intravenous contrast.  Contrast: OMNIPAQUE IOHEXOL 300 MG/ML  SOLN  Comparison: 04/09/2011  Findings: Lung bases are clear.  Multiple  probable hepatic cysts, unchanged.  Spleen, pancreas, and adrenal glands within normal limits.  Gallbladder is unremarkable.  No intrahepatic or extrahepatic ductal dilatation.  1.4 cm posterior interpolar left renal cyst (series 2/image 27). Right kidney is unremarkable.  No hydronephrosis.  No evidence of bowel obstruction.  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  Normal appendix.  Left lower quadrant colostomy.  Moderate left parastomal hernia containing fat and a loop of nondilated small bowel (series 2/image 39).  Again seen is rectal wall thickening, likely mildly progressed (series 2/image 70), suspicious for recurrent tumor.  Adjacent 3.0 x 2.0 cm right perirectal node (series 2/image 61), previously 8 mm, compatible with nodal metastasis.  3.3 x 2.4 cm fluid collection arising from the left rectal wall (series 2/image 66).  Adjacent/contiguous 3.5 x 2.0 cm fluid  collection in the left pelvis (series 2/image 60), with associated thin enhancing rim, worrisome for possible abscess.  Bilobed infrarenal abdominal aortic aneurysm measuring 3.4 and 3.7 cm in AP diameter.  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 Indwelling Foley catheter.  Prostate is unremarkable.  Direct extension of tumor into the right seminal vesicle is difficult to exclude (series 4/image 22).  Degenerative changes of the visualized thoracolumbar spine.  IMPRESSION: Recurrent rectal tumor with associated 3.0 x 2.0 cm right perirectal nodal metastasis.  Additional left pelvic/perirectal fluid collections, as described above, worrisome for possible small abscess.  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  Left lower quadrant colostomy.  Parastomal hernia containing fat and a loop of nondilated small bowel.  No evidence of bowel obstruction.  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 Indwelling Foley catheter.  Original Report Authenticated By: Charline Bills, M.D.    Scheduled Meds:    . ampicillin-sulbactam (UNASYN) IV  1.5 g Intravenous Q6H  . antiseptic oral rinse  15 mL Mouth Rinse q12n4p  . aspirin EC  81 mg Oral Daily  . chlorhexidine  15 mL Mouth Rinse BID  . digoxin  125 mcg Oral Daily  . ferrous sulfate  325 mg Oral Q breakfast  . Fluticasone-Salmeterol  1 puff Inhalation Q12H  . Ipratropium-Albuterol  1 puff Inhalation QID  . metFORMIN  500 mg Oral QHS  . metoCLOPramide (REGLAN) injection  10 mg Intravenous TID AC & HS  . omega-3 acid ethyl esters  1 g Oral Daily  . simvastatin  40 mg Oral QHS  . Warfarin - Pharmacist Dosing Inpatient   Does not apply q1800   Continuous Infusions:    . sodium chloride 1,000 mL (12/16/11 0740)    Principal Problem:  *Abdominal pain Active Problems:  Rectal carcinoma  Anemia  Thrombocytopenia  Hyponatremia  Warfarin-induced  coagulopathy  Weakness generalized    Time spent:    [facility]  [facility] Pager [phone]. If 8PM-8AM, please contact night-coverage at [email or web address], password [email or web address] 12/16/2011, 8:45 PM  LOS: 5 days

## 2011-12-16 NOTE — Progress Notes (Signed)
IP PROGRESS NOTE  Subjective:   Patient feels a little better. Wants to eat.   Objective:  Vital signs in last 24 hours: Temp:  [97.6 F (36.4 C)-98.5 F (36.9 C)] 97.8 F (36.6 C) (08/14 0535) Pulse Rate:  [78-93] 88  (08/14 0535) Resp:  [16-20] 16  (08/14 0535) BP: (125-156)/(63-80) 153/63 mmHg (08/14 0535) SpO2:  [94 %-99 %] 97 % (08/14 0535) Weight change:  Last BM Date: 12/15/11  Intake/Output from previous day: 08/13 0701 - 08/14 0700 In: 1874.1 [P.O.:200; I.V.:1205.1; Blood:317; IV Piggyback:152] Out: 1575 [Urine:1375; Stool:200]  Mouth: mucous membranes moist, pharynx normal without lesions Resp: clear to auscultation bilaterally Cardio: regular rate and rhythm, S1, S2 normal, no murmur, click, rub or gallop GI: soft, non-tender; bowel sounds normal; no masses,  no organomegaly. Tender on palpation.  Extremities: extremities normal, atraumatic, no cyanosis or edema  Portacath/PICC-without erythema  Lab Results:  Basename 12/16/11 0333 12/15/11 1956 12/15/11 0330  WBC 7.7 -- 12.8*  HGB 11.5* 10.8* --  HCT 35.0* 31.9* --  PLT 278 -- 312    BMET  Basename 12/16/11 0333 12/15/11 0330  NA 138 135  K 3.6 3.8  CL 102 96  CO2 28 28  GLUCOSE 142* 164*  BUN 26* 26*  CREATININE 0.93 0.99  CALCIUM 8.6 8.9    Studies/Results: No results found.  Medications: I have reviewed the patient's current medications.  Assessment/Plan:  This is a pleasant 78 year old gentleman with the following issues:   1. Advanced rectal carcinoma, presented with a T4 N2 disease in 2011. He presented with a complete obstruction. After segmental resection, he underwent radiation therapy with Xeloda and subsequently systemic chemotherapy. At this time, he has really no evidence of any recurrent disease, at least that is grossly evident. He has always been certainly at high risk of local recurrence, as well as systemic recurrence. His last CT chest, abdomen, and pelvis showed Recurrent  rectal tumor with associated 3.0 x 2.0 cm right perirectal nodal metastasis. No diffuse disease noted.   He will need salvage chemotherapy in the neat future once  His clinical status stabilizes and preferably as out patient.     2. Abdominal pain: this could be in addition to his cancer related to enteritis. I agree with supportive management like you are doing. I hope his pain will improve and he can tolerate po and can be discharged home and can treat his cancer as out patient if possible.     3. Atrial fibrillation. INR levels are noted. Warfarin-induced coagulopathy being addressed by the primary team.           LOS: 5 days   [facility] 12/16/2011, 8:11 AM

## 2011-12-16 NOTE — Progress Notes (Signed)
OT Note: Checked with patient.  He doesn't feel he needs OT.  He has family assist, if needed, but believes he will be independent with BADLs.  Also has a shower chair at home. Pt did well with PT yesterday.   [place], OTR/L [phone] 12/16/2011

## 2011-12-16 NOTE — Progress Notes (Signed)
[facility] Progress Note  Subjective:  Pain is slightly better today; patient is unsure if it is better from pain meds or if he is actually getting a little better.  Not aware of any bleeding O/N.  Drank minimal amount of clear liquids this AM.  Objective:  Vital signs in last 24 hours: Temp:  [97.6 F (36.4 C)-98.5 F (36.9 C)] 97.8 F (36.6 C) (08/14 0535) Pulse Rate:  [78-93] 88  (08/14 0535) Resp:  [16-20] 16  (08/14 0535) BP: (125-156)/(63-80) 153/63 mmHg (08/14 0535) SpO2:  [94 %-99 %] 96 % (08/14 0822) Last BM Date: 12/16/11 General:   Alert,  Well-developed, in NAD; very tired Heart:  Regular rate and rhythm; no murmurs Pulm:  CTAB; No W/R/R Abdomen: Soft, nondistended.  Mild TTP > in the LLQ and suprapubic region, BS active, nonpalp mass or hsm. Colostomy noted in the LLQ with soft brown stool, no blood. Laparotomy scar noted.  Extremities:  Without edema. Neurologic:  Alert and  oriented x4;  grossly normal neurologically. Psych:  Alert and cooperative. Normal mood and affect.  Intake/Output from previous day: 08/13 0701 - 08/14 0700 In: 1874.1 [P.O.:200; I.V.:1205.1; Blood:317; IV Piggyback:152] Out: 1575 [Urine:1375; Stool:200]  Lab Results:  CEA 4.8  Basename 12/16/11 0333 12/15/11 1956 12/15/11 1120 12/15/11 0330 12/14/11 0340  WBC 7.7 -- -- 12.8* 10.9*  HGB 11.5* 10.8* 11.9* -- --  HCT 35.0* 31.9* 35.1* -- --  PLT 278 -- -- 312 237   BMET  Basename 12/16/11 0333 12/15/11 0330 12/14/11 0340  NA 138 135 132*  K 3.6 3.8 3.8  CL 102 96 98  CO2 28 28 26   GLUCOSE 142* 164* 142*  BUN 26* 26* 21  CREATININE 0.93 0.99 1.03  CALCIUM 8.6 8.9 8.5   PT/INR  Basename 12/16/11 0333 12/15/11 0330  LABPROT 30.1* 55.3*  INR 2.82* 6.14*   Assessment / Plan: 1. Abdominal pain-likely multifactorial due to the several abdominal abnormalities seen on CT scan  2. 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 Rectal bleeding-likely secondary to #2 4. 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 Perirectal fluid collections worrisome for abscesses seen on CT-patient is on Unasyn  6. Anemia-Hgb has improved without transfusion  7. Coumadin coagulopathy with supratherapeutic INR still at 2.82 today after receiving FFP and vitamin K yesterday 8. Afib   -Continue pain control, IVF's, antiemetics, and other symptomatic care.  -Monitor labs including blood counts and INR.  -Will plan for flex sig tomorrow, tentatively, if INR is ok in the morning (1.5 or less) to see if there is anything amenable to endoscopic therapy, ie APC, etc.   **Will not give enemas to prep him for his procedure since he only has a rectal stump and a diverting colostomy.  Also he was exquisitely tender on rectal exam yesterday.  Principal Problem:  *Abdominal pain Active Problems:  Rectal carcinoma  Anemia  Thrombocytopenia  Hyponatremia  Warfarin-induced coagulopathy  Weakness generalized     LOS: 5 days   ZEHR, JESSICA D.  12/16/2011, 8:48 AM

## 2011-12-16 NOTE — Progress Notes (Signed)
Follow up with pt on [address] [place].   Family was not present with pt at time of visit.  Pt spoke with chaplain about tension within family system.  Described son Roe Coombs) "giving up on" him and attempting to arrange so he would inherit significant portion of pt's estate.  Pt expressed frustration and anger and that he has "no use for him" Roe Coombs).   Pt describes daughter as primary support, as Roe Coombs has not been to see pt at hospital after admission to ED.    Chaplain introduced spiritual care as resource, provided empathic presence and space for pt to voice present frustrations.  Will continue to follow pt and family and assess for support - attempting to make contact with pt's daughter and grandson, who have been visiting pt.    Belva Crome  MDiv, Chaplain    12/16/11 1300  Clinical Encounter Type  Visited With Patient  Visit Type Psychological support;Social support;Spiritual support;Follow-up  Referral From Nurse  Consult/Referral To Nurse;Chaplain  Recommendations Continue to follow for support around family system tension   Spiritual Encounters  Spiritual Needs Emotional  Stress Factors  Patient Stress Factors Family relationships;Health changes;Major life changes  Family Stress Factors Family relationships

## 2011-12-16 NOTE — Progress Notes (Signed)
ANTICOAGULATION CONSULT NOTE  Pharmacy Consult for warfarin (currently on hold) Indication: VTE treatment  No Known Allergies  Patient Measurements: Height: 5\' 11"  (180.3 cm) Weight: 201 lb 9.6 oz (91.445 kg) IBW/kg (Calculated) : 75.3    Vital Signs: Temp: 97.8 F (36.6 C) (08/14 0535) Temp src: Oral (08/14 0535) BP: 153/63 mmHg (08/14 0535) Pulse Rate: 88  (08/14 0535)  Labs:  Basename 12/16/11 0333 12/15/11 1956 12/15/11 1120 12/15/11 0330 12/14/11 0340  HGB 11.5* 10.8* -- -- --  HCT 35.0* 31.9* 35.1* -- --  PLT 278 -- -- 312 237  APTT -- -- -- -- --  LABPROT 30.1* -- -- 55.3* 50.4*  INR 2.82* -- -- 6.14* 5.45*  HEPARINUNFRC -- -- -- -- --  CREATININE 0.93 -- -- 0.99 1.03  CKTOTAL -- -- -- -- --  CKMB -- -- -- -- --  TROPONINI -- -- -- -- --   Estimated Creatinine Clearance: 78.1 ml/min (by C-G formula based on Cr of 0.93).   Medications:     . ampicillin-sulbactam (UNASYN) IV  1.5 g Intravenous Q6H  . antiseptic oral rinse  15 mL Mouth Rinse q12n4p  . aspirin EC  81 mg Oral Daily  . chlorhexidine  15 mL Mouth Rinse BID  . digoxin  125 mcg Oral Daily  . ferrous sulfate  325 mg Oral Q breakfast  . Fluticasone-Salmeterol  1 puff Inhalation Q12H  . Ipratropium-Albuterol  1 puff Inhalation QID  . metFORMIN  500 mg Oral QHS  . metoCLOPramide (REGLAN) injection  10 mg Intravenous TID AC & HS  . omega-3 acid ethyl esters  1 g Oral Daily  . phytonadione  5 mg Subcutaneous Once  . simvastatin  40 mg Oral QHS  . vitamin A & D      . Warfarin - Pharmacist Dosing Inpatient   Does not apply q1800     Assessment:  78 yo M admit with abdominal pain, history of rectal cancer with recurrence and possible intra-abdominal abscess seen on CT scan.  PTA on chronic warfarin 5 mg daily, except on Monday and Friday 2.5mg   Had reports of rectal bleeding on admission, no further reports of bleeding  H/H stable  INR now therapeutic after FFP and vitamin K yesterday with  plan to do flex sig per notes tomorrow if INR <= 1.5  Potential drug interaction with Unasyn (may increase INR)  Goal of Therapy:  INR 2-3   Plan:   Continue to hold warfarin today in preparation for flex sig  Daily PT/INR   Hessie Knows, PharmD, BCPS Pager [phone] 12/16/2011 10:15 AM

## 2011-12-16 NOTE — Progress Notes (Signed)
I agree with the above documentation, including the assessment and plan. 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 Will plan flex sig to eval rectal stump to see if any endo-therapy possible to limit bleeding.  Likely improvement in INR will help as much as any other intervention. Onc following and planning for outpt salvage chemo.

## 2011-12-17 DIAGNOSIS — R634 Abnormal weight loss: Secondary | ICD-10-CM

## 2011-12-17 LAB — CBC
Hemoglobin: 10.7 g/dL — ABNORMAL LOW (ref 13.0–17.0)
MCH: 31.8 pg (ref 26.0–34.0)
Platelets: 278 10*3/uL (ref 150–400)
RBC: 3.36 MIL/uL — ABNORMAL LOW (ref 4.22–5.81)
WBC: 8.3 10*3/uL (ref 4.0–10.5)

## 2011-12-17 LAB — BASIC METABOLIC PANEL
CO2: 31 mEq/L (ref 19–32)
Chloride: 100 mEq/L (ref 96–112)
Glucose, Bld: 131 mg/dL — ABNORMAL HIGH (ref 70–99)
Sodium: 136 mEq/L (ref 135–145)

## 2011-12-17 LAB — PROTIME-INR: Prothrombin Time: 18.5 seconds — ABNORMAL HIGH (ref 11.6–15.2)

## 2011-12-17 NOTE — Progress Notes (Signed)
General Surgery [facility]   I continue to follow the patient via Epic with intermittent visits as in-patient.  Await flex sig exam.  CEA level noted.  On abx.  Would repeat CT abd/pelvis near end of abx course.  Will follow.  Velora Heckler, MD, [facility] Office: [phone]

## 2011-12-17 NOTE — Progress Notes (Signed)
PT Cancellation Note  Treatment cancelled today due to declining therapy this morning since he was tired and upon checking back, pt was asleep.  Will check back as schedule permits.  Brylea Pita,KATHrine E 12/17/2011, 3:35 PM Pager: [phone]

## 2011-12-17 NOTE — Progress Notes (Signed)
IP PROGRESS NOTE  Subjective:   Patient feels a little better. His pain is improving and eating well this am.   Objective:  Vital signs in last 24 hours: Temp:  [97.5 F (36.4 C)-98.2 F (36.8 C)] 97.7 F (36.5 C) (08/15 0550) Pulse Rate:  [64-89] 82  (08/15 0550) Resp:  [15-18] 17  (08/15 0550) BP: (123-136)/(53-76) 123/68 mmHg (08/15 0550) SpO2:  [96 %-98 %] 98 % (08/15 0550) Weight change:  Last BM Date: 12/16/11  Intake/Output from previous day: 08/14 0701 - 08/15 0700 In: 2279.7 [P.O.:830; I.V.:1229.7; IV Piggyback:220] Out: 1200 [Urine:1200]  Mouth: mucous membranes moist, pharynx normal without lesions Resp: clear to auscultation bilaterally Cardio: regular rate and rhythm, S1, S2 normal, no murmur, click, rub or gallop GI: soft, non-tender; bowel sounds normal; no masses,  no organomegaly. Tender on palpation.  Extremities: extremities normal, atraumatic, no cyanosis or edema  Portacath/PICC-without erythema  Lab Results:  Basename 12/17/11 0423 12/16/11 0333  WBC 8.3 7.7  HGB 10.7* 11.5*  HCT 32.5* 35.0*  PLT 278 278    BMET  Basename 12/17/11 0423 12/16/11 0333  NA 136 138  K 3.5 3.6  CL 100 102  CO2 31 28  GLUCOSE 131* 142*  BUN 19 26*  CREATININE 0.92 0.93  CALCIUM 8.6 8.6    Studies/Results: No results found.  Medications: I have reviewed the patient's current medications.  Assessment/Plan:  This is a pleasant 78 year old gentleman with the following issues:   1. Advanced rectal carcinoma, presented with a T4 N2 disease in 2011. He presented with a complete obstruction. After segmental resection, he underwent radiation therapy with Xeloda and subsequently systemic chemotherapy. At this time, he has really no evidence of any recurrent disease, at least that is grossly evident. He has always been certainly at high risk of local recurrence, as well as systemic recurrence. His last CT chest, abdomen, and pelvis showed Recurrent rectal tumor  with associated 3.0 x 2.0 cm right perirectal nodal metastasis. No diffuse disease noted.   He will need salvage chemotherapy in the future as out patient.   We we arrange for follow up upon discharge.   2. Abdominal pain: this could be in addition to his cancer related to enteritis. Improving daily.     3. Atrial fibrillation. INR levels are noted. Warfarin-induced coagulopathy being addressed by the primary team. It seems better at this time.       LOS: 6 days   [facility] 12/17/2011, 7:34 AM

## 2011-12-17 NOTE — Progress Notes (Signed)
Patient had been NPO for endoscopic procedure today. Had NPO sign on door to room. BUT dietary representative took patient a house tray into room this am. RN was unaware this had happened until after patient had already eaten grits, ice cream, and coffee. Called Endoscopy to make them aware. They to speak to Dr Rhea Belton.

## 2011-12-17 NOTE — Progress Notes (Signed)
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  -Lipase done and within normal limits  - beginning to improve, tolerable with current pain regimen reportedly  Active Problems:  Rectal carcinoma with recurrence  -Appreciate Dr. Alver Fisher input-planning chemo as outpt.  - GI and General surgery on board.  Will follow up with their recommendations.   - Pt to be NPO tonight for endoscopy tomorrow.  Rectal bleeding  -Likely secondary to #1 in setting of supratherapeutic INR  -s/p fresh frozen and vitamin K on 8/13  -appreciate GI input, continue suppotrive care  -hgb stable, follow  Anemia, Fe deficiency  -hgb stable for now, follow and transfuse PRBCs as appropriate  -continue supplemental Fe   H/o PAF  -off Coumadin for now, s/p FFP and vitamin K follow and INR now down to 1.51   ?intraabdominal abscess  -per CT, continue empiric ABX-unasyn  - Appreciate surgery input- little to offer from surgical standpoint,  -Discussed with surgery and recommend antibiotics if patient has a white cell count or fever. He does have leukocytosis following admission-so will continue empiric antibiotics-10-14 day total. Per surgery's note we are to consider CT at the end of antibiotic course.  Thrombocytopenia  -plt count improved and remains stable   Hyponatremia  -vol depletion vs SIADH  -resolved, follow   Warfarin-induced coagulopathy  -INR now down to 1.51 s/p coumadin reversal on 8/13 as above  -?appropriateness of chronic coumadin in this pt to be discussed with pt's  cardiologist as well as GI and onc per rounding hospitalist   Weakness generalized  -likely secondary to above   Code Status: full  Family Communication: wife via [phone])  Disposition Plan: to home when medically stable   Brief narrative:  The pt is [name] with a history of Rectal Cancer diagnosed in 02/2010 S/P surgical resection and colostomy along with Chemotherapy and Radiation treatments who presents with 2 weeks of worsening diffuse ABD Pain along with generalized weakness. He was evaluated in the ED and found to found to have recurrence of cancer on Ct scan of the ABD and Pelvis. He was Admitted for further evaluation and management. General Surgery/Dr Gerrit Friends was consulted   Consultants:  Surgery- Gerkin  Oncology- Shadad  GI- Dr Rhea Belton Procedures:  none Antibiotics:  unasyn HPI/Subjective: Pt mentions that abdominal discomfort currently is tolerable.  No acute issues reported overnight.  Denies any active bleeding.  Denies any nausea or emesis  Objective: Filed Vitals:   12/17/11 0145 12/17/11 0550 12/17/11 1000 12/17/11 1400  BP: 131/74 123/68 148/68 143/70  Pulse: 85 82 86 84  Temp: 97.8 F (36.6 C) 97.7 F (36.5 C) 98.4 F (36.9 C) 98.4 F (36.9 C)  TempSrc: Oral Oral Oral Oral  Resp: 15 17 18 20   Height:      Weight:      SpO2: 96% 98% 96% 97%    Intake/Output Summary (Last 24 hours) at 12/17/11 1651 Last data filed at 12/17/11 1400  Gross per 24 hour  Intake 1624.65 ml  Output   1150 ml  Net 474.65 ml   Filed Weights   12/11/11 2124  Weight: 91.445 kg (  201 lb 9.6 oz)    Exam:   General:  Pt in NAD, Alert and Awake  Cardiovascular: RRR, No MRG  Respiratory: CTA BL anteriorly, no wheezes  Abdomen: soft, NT, ostomy bag in place no blood in bag  Data Reviewed: Basic Metabolic Panel:  Lab 12/17/11 5784 12/16/11 0333 12/15/11 0330 12/14/11 0340 12/13/11 0355  NA 136 138 135 132* 130*  K 3.5 3.6 3.8 3.8 4.1  CL 100 102 96 98  97  CO2 31 28 28 26 24   GLUCOSE 131* 142* 164* 142* 160*  BUN 19 26* 26* 21 22  CREATININE 0.92 0.93 0.99 1.03 1.09  CALCIUM 8.6 8.6 8.9 8.5 8.2*  MG -- -- -- -- --  PHOS -- -- -- -- --   Liver Function Tests:  Lab 12/11/11 1423  AST 25  ALT 22  ALKPHOS 74  BILITOT 0.7  PROT 6.7  ALBUMIN 2.8*    Lab 12/15/11 0330  LIPASE 20  AMYLASE --   No results found for this basename: AMMONIA:5 in the last 168 hours CBC:  Lab 12/17/11 0423 12/16/11 0333 12/15/11 1956 12/15/11 1120 12/15/11 0330 12/14/11 0340 12/13/11 0355 12/11/11 1423  WBC 8.3 7.7 -- -- 12.8* 10.9* 11.9* --  NEUTROABS -- -- -- -- -- -- -- 8.4*  HGB 10.7* 11.5* 10.8* 11.9* 12.1* -- -- --  HCT 32.5* 35.0* 31.9* 35.1* 36.0* -- -- --  MCV 96.7 95.4 -- -- 94.0 95.2 94.5 --  PLT 278 278 -- -- 312 237 205 --   Cardiac Enzymes: No results found for this basename: CKTOTAL:5,CKMB:5,CKMBINDEX:5,TROPONINI:5 in the last 168 hours BNP (last 3 results) No results found for this basename: PROBNP:3 in the last 8760 hours CBG: No results found for this basename: GLUCAP:5 in the last 168 hours  Recent Results (from the past 240 hour(s))  URINE CULTURE     Status: Normal   Collection Time   12/11/11  2:24 PM      Component Value Range Status Comment   Specimen Description URINE, RANDOM   Final    Special Requests NONE   Final    Culture  Setup Time 12/12/2011 02:01   Final    Colony Count 8,000 COLONIES/ML   Final    Culture INSIGNIFICANT GROWTH   Final    Report Status 12/13/2011 FINAL   Final      Studies: Dg Chest 2 View  12/11/2011  *RADIOLOGY REPORT*  Clinical Data: Shortness of breath.  Generalized weakness. Abdominal pain.  History of colorectal cancer.  CHEST - 2 VIEW  Comparison: CT chest 04/09/2011, 10/20/2010, and portable chest x- ray 06/04/2010.  Findings: Cardiac silhouette normal in size, unchanged.  Thoracic aorta mildly atherosclerotic, unchanged.  Hilar and mediastinal contours otherwise unremarkable.  Lungs  mildly hyperinflated but clear.  Bronchovascular markings normal.  No pleural effusions. Degenerative changes involving the thoracic spine.  Left subclavian Port-A-Cath tip in the mid SVC.  Degenerative changes involving the right shoulder.  IMPRESSION: Mild hyperinflation consistent with COPD and/or asthma.  No acute cardiopulmonary disease.  Stable examination.  Original Report Authenticated By: Arnell Sieving, M.D.   Ct Chest W Contrast  12/13/2011  *RADIOLOGY REPORT*  Clinical Data: Restaging for metastases.  Recurrent rectal cancer.  CT CHEST WITH CONTRAST  Technique:  Multidetector CT imaging of the chest was performed following the standard protocol during bolus administration of intravenous contrast.  Contrast: OMNIPAQUE IOHEXOL 300 MG/ML  SOLN  Comparison: CT chest dated 04/09/2011  Findings:  Trace bilateral pleural effusions with minimal dependent atelectasis in the posterior right upper and bilateral lower lobes. No suspicious pulmonary nodules.  No pleural effusion or pneumothorax.  Visualized thyroid is unremarkable.  The heart is normal in size.  No pericardial effusion.  Mild atherosclerotic calcifications of the aortic arch.  Small mediastinal lymph nodes measuring up to 8 mm short axis, which do not meet pathologic CT size criteria.  No suspicious mediastinal or axillary lymphadenopathy.  Left chest port.  Visualized upper abdomen is notable for multiple tiny probable hepatic cysts, better visualized on recent CT abdomen pelvis.  Mild degenerative changes of the visualized thoracolumbar spine.  IMPRESSION: No evidence of metastatic disease in the chest.  Trace bilateral pleural effusions with associated minimal dependent atelectasis.  Original Report Authenticated By: Charline Bills, M.D.   Ct Abdomen Pelvis W Contrast  12/11/2011  *RADIOLOGY REPORT*  Clinical Data: Abdominal pain, hematuria, history of colon cancer status post resection in 2011  CT ABDOMEN AND PELVIS WITH CONTRAST   Technique:  Multidetector CT imaging of the abdomen and pelvis was performed following the standard protocol during bolus administration of intravenous contrast.  Contrast: OMNIPAQUE IOHEXOL 300 MG/ML  SOLN  Comparison: 04/09/2011  Findings: Lung bases are clear.  Multiple probable hepatic cysts, unchanged.  Spleen, pancreas, and adrenal glands within normal limits.  Gallbladder is unremarkable.  No intrahepatic or extrahepatic ductal dilatation.  1.4 cm posterior interpolar left renal cyst (series 2/image 27). Right kidney is unremarkable.  No hydronephrosis.  No evidence of bowel obstruction.  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  Normal appendix.  Left lower quadrant colostomy.  Moderate left parastomal hernia containing fat and a loop of nondilated small bowel (series 2/image 39).  Again seen is rectal wall thickening, likely mildly progressed (series 2/image 70), suspicious for recurrent tumor.  Adjacent 3.0 x 2.0 cm right perirectal node (series 2/image 61), previously 8 mm, compatible with nodal metastasis.  3.3 x 2.4 cm fluid collection arising from the left rectal wall (series 2/image 66).  Adjacent/contiguous 3.5 x 2.0 cm fluid collection in the left pelvis (series 2/image 60), with associated thin enhancing rim, worrisome for possible abscess.  Bilobed infrarenal abdominal aortic aneurysm measuring 3.4 and 3.7 cm in AP diameter.  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 Indwelling Foley catheter.  Prostate is unremarkable.  Direct extension of tumor into the right seminal vesicle is difficult to exclude (series 4/image 22).  Degenerative changes of the visualized thoracolumbar spine.  IMPRESSION: Recurrent rectal tumor with associated 3.0 x 2.0 cm right perirectal nodal metastasis.  Additional left pelvic/perirectal fluid collections, as described above, worrisome for possible small  abscess.  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  Left lower quadrant colostomy.  Parastomal hernia containing fat and a loop of nondilated small bowel.  No evidence of bowel obstruction.  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 Indwelling Foley catheter.  Original Report Authenticated By: Charline Bills, M.D.    Scheduled Meds:   . ampicillin-sulbactam (UNASYN) IV  1.5 g Intravenous Q6H  . antiseptic oral rinse  15 mL Mouth Rinse q12n4p  . aspirin EC  81 mg Oral Daily  . chlorhexidine  15 mL Mouth Rinse BID  . digoxin  125 mcg Oral Daily  . ferrous sulfate  325 mg Oral Q breakfast  . Fluticasone-Salmeterol  1 puff Inhalation Q12H  . Ipratropium-Albuterol  1 puff Inhalation QID  . metFORMIN  500 mg Oral QHS  . metoCLOPramide (REGLAN) injection  10 mg Intravenous TID AC & HS  . omega-3 acid ethyl esters  1 g Oral Daily  . simvastatin  40 mg Oral QHS  . Warfarin - Pharmacist Dosing Inpatient   Does not apply q1800   Continuous Infusions:   . sodium chloride 1,000 mL (12/17/11 0428)    Principal Problem:  *Abdominal pain Active Problems:  Rectal carcinoma  Anemia  Thrombocytopenia  Hyponatremia  Warfarin-induced coagulopathy  Weakness generalized    Time spent: > 30 minutes    Penny Pia  [facility] Pager [phone]. If 8PM-8AM, please contact night-coverage at [email or web address], password [facility] 12/17/2011, 4:51 PM  LOS: 6 days

## 2011-12-17 NOTE — Progress Notes (Signed)
ANTICOAGULATION CONSULT NOTE  Pharmacy Consult for warfarin (currently on hold) Indication: VTE treatment  No Known Allergies  Patient Measurements: Height: 5\' 11"  (180.3 cm) Weight: 201 lb 9.6 oz (91.445 kg) IBW/kg (Calculated) : 75.3   Vital Signs: Temp: 98.4 F (36.9 C) (08/15 1000) Temp src: Oral (08/15 1000) BP: 148/68 mmHg (08/15 1000) Pulse Rate: 86  (08/15 1000)  Labs:  Basename 12/17/11 0423 12/16/11 0333 12/15/11 1956 12/15/11 0330  HGB 10.7* 11.5* -- --  HCT 32.5* 35.0* 31.9* --  PLT 278 278 -- 312  APTT -- -- -- --  LABPROT 18.5* 30.1* -- 55.3*  INR 1.51* 2.82* -- 6.14*  HEPARINUNFRC -- -- -- --  CREATININE 0.92 0.93 -- 0.99  CKTOTAL -- -- -- --  CKMB -- -- -- --  TROPONINI -- -- -- --   Estimated Creatinine Clearance: 78.9 ml/min (by C-G formula based on Cr of 0.92).   Medications:     . ampicillin-sulbactam (UNASYN) IV  1.5 g Intravenous Q6H  . antiseptic oral rinse  15 mL Mouth Rinse q12n4p  . aspirin EC  81 mg Oral Daily  . chlorhexidine  15 mL Mouth Rinse BID  . digoxin  125 mcg Oral Daily  . ferrous sulfate  325 mg Oral Q breakfast  . Fluticasone-Salmeterol  1 puff Inhalation Q12H  . Ipratropium-Albuterol  1 puff Inhalation QID  . metFORMIN  500 mg Oral QHS  . metoCLOPramide (REGLAN) injection  10 mg Intravenous TID AC & HS  . omega-3 acid ethyl esters  1 g Oral Daily  . simvastatin  40 mg Oral QHS  . Warfarin - Pharmacist Dosing Inpatient   Does not apply q1800     Assessment:  78 yo M admit with abdominal pain, history of rectal cancer with recurrence and possible intra-abdominal abscess seen on CT scan.  PTA on chronic warfarin 5 mg daily, except on Monday and Friday 2.5mg   Had reports of rectal bleeding on admission, no further reports of bleeding  H/H stable  INR decreased to 1.51 today after FFP and vitamin K yesterday   Plan Flex Sigmoidoscopy 8/16  Potential drug interaction with Unasyn (may increase INR)  Goal of  Therapy:  INR 2-3   Plan:   Continue to hold warfarin in preparation for flex sig 8/16  Daily PT/INR   Otho Bellows , PharmD,  Pager [phone] 12/17/2011 1:01 PM

## 2011-12-17 NOTE — Progress Notes (Signed)
[facility] Progress Note  Subjective:  Feeling better today.  Ate some grits and ice cream for breakfast, so flex sig will be rescheduled for tomorrow.  Patient denies any further rectal bleeding at this time.  Objective:  Vital signs in last 24 hours: Temp:  [97.5 F (36.4 C)-98.2 F (36.8 C)] 97.7 F (36.5 C) (08/15 0550) Pulse Rate:  [64-89] 82  (08/15 0550) Resp:  [15-18] 17  (08/15 0550) BP: (123-136)/(53-76) 123/68 mmHg (08/15 0550) SpO2:  [96 %-98 %] 98 % (08/15 0550) Last BM Date: 12/16/11 General:   Alert,  Well-developed, in NAD Heart:  Regular rate and rhythm; no murmurs Pulm:  CTAB. No W/R/R Abdomen:  Soft, nondistended. Normal bowel sounds, without guarding, and without rebound.  Mild TTP in lower abdomen.  Colostomy bag noted in LLQ.  Laparotomy scar noted.   Extremities:  Without edema. Neurologic:  Alert and  oriented x4;  grossly normal neurologically. Psych:  Alert and cooperative. Normal mood and affect.  Intake/Output from previous day: 08/14 0701 - 08/15 0700 In: 2281.7 [P.O.:830; I.V.:1229.7; IV Piggyback:222] Out: 1200 [Urine:1200] Intake/Output this shift: Total I/O In: 240 [P.O.:240] Out: -   Lab Results:  Basename 12/17/11 0423 12/16/11 0333 12/15/11 1956 12/15/11 0330  WBC 8.3 7.7 -- 12.8*  HGB 10.7* 11.5* 10.8* --  HCT 32.5* 35.0* 31.9* --  PLT 278 278 -- 312   BMET  Basename 12/17/11 0423 12/16/11 0333 12/15/11 0330  NA 136 138 135  K 3.5 3.6 3.8  CL 100 102 96  CO2 31 28 28   GLUCOSE 131* 142* 164*  BUN 19 26* 26*  CREATININE 0.92 0.93 0.99  CALCIUM 8.6 8.6 8.9   PT/INR  Basename 12/17/11 0423 12/16/11 0333  LABPROT 18.5* 30.1*  INR 1.51* 2.82*   Assessment / Plan: 1. Abdominal pain-likely multifactorial due to the several abdominal abnormalities seen on CT scan  2. 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 Rectal  bleeding-likely secondary to #2; resolved for now since INR correction 4. 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 Perirectal fluid collections worrisome for abscesses seen on CT-patient is on Unasyn  6. Anemia-Hgb down slightly today but patient denies bleeding 7. Coumadin coagulopathy with supratherapeutic INR; now improved post FFP and Vitamin K  -Continue pain control, IVF's, antiemetics, and other symptomatic care.  -Monitor labs including blood counts and INR.  -Will reschedule flex sig for tomorrow, since patient had breakfast today, to see if there is anything amenable to endoscopic therapy, ie APC, etc. -Outpatient salvage chemo per oncology.   **Will not give enemas to prep him for his procedure since he only has a rectal stump and a diverting colostomy.  Principal Problem:  *Abdominal pain Active Problems:  Rectal carcinoma  Anemia  Thrombocytopenia  Hyponatremia  Warfarin-induced coagulopathy  Weakness generalized    LOS: 6 days   Carly Applegate D.  12/17/2011, 8:54 AM

## 2011-12-17 NOTE — Progress Notes (Signed)
I agree with the above documentation, including the assessment and plan. Plan sedated flex sigmoidoscopy tomorrow morning.  Pt ate full breakfast this am.  Fortunately bleeding has slowed and resolved for now, likely due in part to improved INR

## 2011-12-18 ENCOUNTER — Other Ambulatory Visit: Payer: Self-pay | Admitting: Oncology

## 2011-12-18 ENCOUNTER — Encounter (HOSPITAL_COMMUNITY)
Admission: EM | Disposition: A | Discharge status: Home Health | Payer: Self-pay | Source: Home / Self Care | Attending: Internal Medicine

## 2011-12-18 ENCOUNTER — Encounter (HOSPITAL_COMMUNITY): Payer: Self-pay | Admitting: Internal Medicine

## 2011-12-18 ENCOUNTER — Ambulatory Visit: Payer: Medicare Other | Admitting: Oncology

## 2011-12-18 DIAGNOSIS — C20 Malignant neoplasm of rectum: Secondary | ICD-10-CM

## 2011-12-18 DIAGNOSIS — C775 Secondary and unspecified malignant neoplasm of intrapelvic lymph nodes: Secondary | ICD-10-CM

## 2011-12-18 HISTORY — PX: FLEXIBLE SIGMOIDOSCOPY: SHX5431

## 2011-12-18 LAB — GLUCOSE, CAPILLARY: Glucose-Capillary: 166 mg/dL — ABNORMAL HIGH (ref 70–99)

## 2011-12-18 LAB — PROTIME-INR
INR: 1.32 (ref 0.00–1.49)
Prothrombin Time: 16.6 seconds — ABNORMAL HIGH (ref 11.6–15.2)

## 2011-12-18 SURGERY — SIGMOIDOSCOPY, FLEXIBLE
Anesthesia: Moderate Sedation

## 2011-12-18 MED ORDER — FENTANYL CITRATE 0.05 MG/ML IJ SOLN
INTRAMUSCULAR | Status: DC | PRN
  Administered 2011-12-18: 25 ug via INTRAVENOUS

## 2011-12-18 MED ORDER — MIDAZOLAM HCL 10 MG/2ML IJ SOLN
INTRAMUSCULAR | Status: AC
  Filled 2011-12-18: qty 2

## 2011-12-18 MED ORDER — FENTANYL CITRATE 0.05 MG/ML IJ SOLN
INTRAMUSCULAR | Status: AC
  Filled 2011-12-18: qty 2

## 2011-12-18 MED ORDER — GUAIFENESIN ER 600 MG PO TB12
600.0000 mg | ORAL_TABLET | Freq: Two times a day (BID) | ORAL | Status: DC | PRN
  Filled 2011-12-18: qty 1

## 2011-12-18 MED ORDER — ENOXAPARIN SODIUM 40 MG/0.4ML ~~LOC~~ SOLN
40.0000 mg | SUBCUTANEOUS | Status: DC
  Administered 2011-12-18 – 2011-12-27 (×10): 40 mg via SUBCUTANEOUS
  Filled 2011-12-18 (×11): qty 0.4

## 2011-12-18 MED ORDER — WARFARIN SODIUM 1 MG PO TABS
1.0000 mg | ORAL_TABLET | Freq: Once | ORAL | Status: AC
  Administered 2011-12-18: 1 mg via ORAL
  Filled 2011-12-18: qty 1

## 2011-12-18 MED ORDER — MIDAZOLAM HCL 10 MG/2ML IJ SOLN
INTRAMUSCULAR | Status: DC | PRN
  Administered 2011-12-18 (×2): 1 mg via INTRAVENOUS

## 2011-12-18 NOTE — Progress Notes (Signed)
[facility] PROGRESS NOTE  DONN ZANETTI JYN:[id number] DOB: 1932-09-10 DOA: 12/11/2011 PCP: Aura Dials, MD  Assessment/Plan: Principal Problem:  *Abdominal pain Active Problems:  Rectal carcinoma  Anemia  Thrombocytopenia  Hyponatremia  Warfarin-induced coagulopathy  Weakness generalized   The pt is [name] with a history of Rectal Cancer diagnosed in 02/2010 S/P surgical resection and colostomy along with Chemotherapy and Radiation treatments who presents with 2 weeks of worsening diffuse ABD Pain along with generalized weakness. He was evaluated in the ED and found to found to have recurrence of cancer on Ct scan of the ABD and Pelvis. He was Admitted for further evaluation and management. General Surgery/Dr Gerrit Friends was consulted     Rectal carcinoma with recurrence  -Appreciate Dr. Alver Fisher input-planning chemo as outpt.  - GI and General surgery on board. Will follow up with their recommendations.  - endoscopy shows: Rectal cancer nearly filling the rectal stump. This is the  source of recent rectal bleeding.  Rectal bleeding  -Likely secondary to #1 in setting of supratherapeutic INR  -s/p fresh frozen and vitamin K on 8/13  -appreciate GI input, continue suppotrive care  -hgb stable, follow  Anemia, Fe deficiency  -hgb stable for now, follow and transfuse PRBCs as appropriate  -continue supplemental Fe  -on coumadin for a fib- attempted to find out cardiologist as patient unable to tell me (left message for wife) - plan to resume coumadin (cautiously) for now until it can be discussed with cardiology, watch for bleeding-   H/o PAF  -off Coumadin for now, s/p FFP and vitamin K   ?intraabdominal abscess  -per CT, continue empiric ABX-unasyn  - Appreciate surgery input- little to offer from surgical standpoint,  -Discussed with surgery and recommend antibiotics if patient has a white cell count or fever. He does have leukocytosis following admission-so  will continue empiric antibiotics-10-14 day total. Per surgery's note we are to consider CT at the end of antibiotic course.   Thrombocytopenia  -plt count improved and remains stable   Hyponatremia  -vol depletion vs SIADH  -resolved, follow   Weakness generalized  -likely secondary to above  -PT consult  Code Status: full Family Communication: LM for wife Disposition Plan: home to follow up with heme-onc     Consultants:  GI  Heme onc   HPI/Subjective: Patient sleepy No cp No SOB No fever No chills   Objective: Filed Vitals:   12/18/11 0950 12/18/11 1000 12/18/11 1010 12/18/11 1020  BP: 117/69 128/68 133/74 134/76  Pulse:      Temp:      TempSrc:      Resp: 25 21 21 22   Height:      Weight:      SpO2: 93% 94% 94% 98%    Intake/Output Summary (Last 24 hours) at 12/18/11 1440 Last data filed at 12/18/11 8657  Gross per 24 hour  Intake 1423.92 ml  Output    800 ml  Net 623.92 ml   Filed Weights   12/11/11 2124 12/18/11 0620  Weight: 91.445 kg (201 lb 9.6 oz) 93.985 kg (207 lb 3.2 oz)    Exam:   General:  Sleepy- just back from procedure  Cardiovascular: rrr  Respiratory: clear anterior, no wheezing  Abdomen: +BS, soft, NT/ND  Data Reviewed: Basic Metabolic Panel:  Lab 12/17/11 8469 12/16/11 0333 12/15/11 0330 12/14/11 0340 12/13/11 0355  NA 136 138 135 132* 130*  K 3.5 3.6 3.8 3.8 4.1  CL 100 102 96  98 97  CO2 31 28 28 26 24   GLUCOSE 131* 142* 164* 142* 160*  BUN 19 26* 26* 21 22  CREATININE 0.92 0.93 0.99 1.03 1.09  CALCIUM 8.6 8.6 8.9 8.5 8.2*  MG -- -- -- -- --  PHOS -- -- -- -- --   Liver Function Tests: No results found for this basename: AST:5,ALT:5,ALKPHOS:5,BILITOT:5,PROT:5,ALBUMIN:5 in the last 168 hours  Lab 12/15/11 0330  LIPASE 20  AMYLASE --   No results found for this basename: AMMONIA:5 in the last 168 hours CBC:  Lab 12/17/11 0423 12/16/11 0333 12/15/11 1956 12/15/11 1120 12/15/11 0330 12/14/11 0340  12/13/11 0355  WBC 8.3 7.7 -- -- 12.8* 10.9* 11.9*  NEUTROABS -- -- -- -- -- -- --  HGB 10.7* 11.5* 10.8* 11.9* 12.1* -- --  HCT 32.5* 35.0* 31.9* 35.1* 36.0* -- --  MCV 96.7 95.4 -- -- 94.0 95.2 94.5  PLT 278 278 -- -- 312 237 205   Cardiac Enzymes: No results found for this basename: CKTOTAL:5,CKMB:5,CKMBINDEX:5,TROPONINI:5 in the last 168 hours BNP (last 3 results) No results found for this basename: PROBNP:3 in the last 8760 hours CBG:  Lab 12/18/11 0852  GLUCAP 166*    Recent Results (from the past 240 hour(s))  URINE CULTURE     Status: Normal   Collection Time   12/11/11  2:24 PM      Component Value Range Status Comment   Specimen Description URINE, RANDOM   Final    Special Requests NONE   Final    Culture  Setup Time 12/12/2011 02:01   Final    Colony Count 8,000 COLONIES/ML   Final    Culture INSIGNIFICANT GROWTH   Final    Report Status 12/13/2011 FINAL   Final      Studies: Dg Chest 2 View  12/11/2011  *RADIOLOGY REPORT*  Clinical Data: Shortness of breath.  Generalized weakness. Abdominal pain.  History of colorectal cancer.  CHEST - 2 VIEW  Comparison: CT chest 04/09/2011, 10/20/2010, and portable chest x- ray 06/04/2010.  Findings: Cardiac silhouette normal in size, unchanged.  Thoracic aorta mildly atherosclerotic, unchanged.  Hilar and mediastinal contours otherwise unremarkable.  Lungs mildly hyperinflated but clear.  Bronchovascular markings normal.  No pleural effusions. Degenerative changes involving the thoracic spine.  Left subclavian Port-A-Cath tip in the mid SVC.  Degenerative changes involving the right shoulder.  IMPRESSION: Mild hyperinflation consistent with COPD and/or asthma.  No acute cardiopulmonary disease.  Stable examination.  Original Report Authenticated By: Arnell Sieving, M.D.   Ct Chest W Contrast  12/13/2011  *RADIOLOGY REPORT*  Clinical Data: Restaging for metastases.  Recurrent rectal cancer.  CT CHEST WITH CONTRAST  Technique:   Multidetector CT imaging of the chest was performed following the standard protocol during bolus administration of intravenous contrast.  Contrast: OMNIPAQUE IOHEXOL 300 MG/ML  SOLN  Comparison: CT chest dated 04/09/2011  Findings: Trace bilateral pleural effusions with minimal dependent atelectasis in the posterior right upper and bilateral lower lobes. No suspicious pulmonary nodules.  No pleural effusion or pneumothorax.  Visualized thyroid is unremarkable.  The heart is normal in size.  No pericardial effusion.  Mild atherosclerotic calcifications of the aortic arch.  Small mediastinal lymph nodes measuring up to 8 mm short axis, which do not meet pathologic CT size criteria.  No suspicious mediastinal or axillary lymphadenopathy.  Left chest port.  Visualized upper abdomen is notable for multiple tiny probable hepatic cysts, better visualized on recent CT abdomen pelvis.  Mild degenerative changes of the visualized thoracolumbar spine.  IMPRESSION: No evidence of metastatic disease in the chest.  Trace bilateral pleural effusions with associated minimal dependent atelectasis.  Original Report Authenticated By: Charline Bills, M.D.   Ct Abdomen Pelvis W Contrast  12/11/2011  *RADIOLOGY REPORT*  Clinical Data: Abdominal pain, hematuria, history of colon cancer status post resection in 2011  CT ABDOMEN AND PELVIS WITH CONTRAST  Technique:  Multidetector CT imaging of the abdomen and pelvis was performed following the standard protocol during bolus administration of intravenous contrast.  Contrast: OMNIPAQUE IOHEXOL 300 MG/ML  SOLN  Comparison: 04/09/2011  Findings: Lung bases are clear.  Multiple probable hepatic cysts, unchanged.  Spleen, pancreas, and adrenal glands within normal limits.  Gallbladder is unremarkable.  No intrahepatic or extrahepatic ductal dilatation.  1.4 cm posterior interpolar left renal cyst (series 2/image 27). Right kidney is unremarkable.  No hydronephrosis.  No evidence  of bowel obstruction.  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  Normal appendix.  Left lower quadrant colostomy.  Moderate left parastomal hernia containing fat and a loop of nondilated small bowel (series 2/image 39).  Again seen is rectal wall thickening, likely mildly progressed (series 2/image 70), suspicious for recurrent tumor.  Adjacent 3.0 x 2.0 cm right perirectal node (series 2/image 61), previously 8 mm, compatible with nodal metastasis.  3.3 x 2.4 cm fluid collection arising from the left rectal wall (series 2/image 66).  Adjacent/contiguous 3.5 x 2.0 cm fluid collection in the left pelvis (series 2/image 60), with associated thin enhancing rim, worrisome for possible abscess.  Bilobed infrarenal abdominal aortic aneurysm measuring 3.4 and 3.7 cm in AP diameter.  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 Indwelling Foley catheter.  Prostate is unremarkable.  Direct extension of tumor into the right seminal vesicle is difficult to exclude (series 4/image 22).  Degenerative changes of the visualized thoracolumbar spine.  IMPRESSION: Recurrent rectal tumor with associated 3.0 x 2.0 cm right perirectal nodal metastasis.  Additional left pelvic/perirectal fluid collections, as described above, worrisome for possible small abscess.  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  Left lower quadrant colostomy.  Parastomal hernia containing fat and a loop of nondilated small bowel.  No evidence of bowel obstruction.  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 Indwelling Foley catheter.  Original Report Authenticated By: Charline Bills, M.D.    Scheduled Meds:   . ampicillin-sulbactam (UNASYN) IV  1.5 g Intravenous Q6H  . antiseptic oral rinse  15 mL Mouth Rinse q12n4p  . aspirin EC  81 mg Oral Daily  . chlorhexidine   15 mL Mouth Rinse BID  . digoxin  125 mcg Oral Daily  . enoxaparin (LOVENOX) injection  40 mg Subcutaneous Q24H  . ferrous sulfate  325 mg Oral Q breakfast  . Fluticasone-Salmeterol  1 puff Inhalation Q12H  . Ipratropium-Albuterol  1 puff Inhalation QID  . metFORMIN  500 mg Oral QHS  . metoCLOPramide (REGLAN) injection  10 mg Intravenous TID AC & HS  . omega-3 acid ethyl esters  1 g Oral Daily  . simvastatin  40 mg Oral QHS  . Warfarin - Pharmacist Dosing Inpatient   Does not apply q1800   Continuous Infusions:   . sodium chloride 1,000 mL (12/18/11 0435)    Principal Problem:  *Abdominal pain Active Problems:  Rectal carcinoma  Anemia  Thrombocytopenia  Hyponatremia  Warfarin-induced  coagulopathy  Weakness generalized    Time spent: 35 min    Marlin Canary  [facility] Pager [phone] 12/18/2011, 2:40 PM  LOS: 7 days

## 2011-12-18 NOTE — Op Note (Signed)
[facility] [address] [place], [place]  [address]  FLEXIBLE SIGMOIDOSCOPY PROCEDURE REPORT  PATIENT:  Vincent [name], Vincent [name]  MR#:  [id number] BIRTHDATE:  October 27, 1932, 78 yrs. old  GENDER:  male ENDOSCOPIST:  Carie Caddy. Maille Halliwell, MD Referred by:  [facility] PROCEDURE DATE:  12/18/2011 PROCEDURE:  Flexible Sigmoidoscopy, diagnostic ASA CLASS:  Class III INDICATIONS:  rectal bleeding, history of cancer (rectal) MEDICATIONS:   Fentanyl 25 mcg IV, Versed 2 mg IV  DESCRIPTION OF PROCEDURE:   After the risks benefits and alternatives of the procedure were thoroughly explained, informed consent was obtained.  Digital rectal exam was performed and revealed rectal mass.   The pentax [id number] endoscope was introduced through the anus and advanced to the rectum, without limitations.  Unprepped.  The instrument was then slowly withdrawn as the mucosa was fully examined. <<PROCEDUREIMAGES>>  A large firm and friable mass was found in the rectum.  This mass nearly fills the rectal stump and is the source of recent hematochezia.  There was no active bleeding and no endoscopic therapy possible   Retroflexion was not possible and thus not performed.   The scope was then withdrawn from the patient and the procedure terminated.  COMPLICATIONS:  None  ENDOSCOPIC IMPRESSION: 1) Rectal cancer nearly filling the rectal stump.  This is the source of recent rectal bleeding.  RECOMMENDATIONS: 1) Per oncology. 2) Closely monitor INR as I would expect this mass to rebleed if INR is supra-therapeutic.  Carie Caddy. Rhea Belton, MD  CC:  [name], MD, The Patient  n. eSIGNEDCarie Caddy. Skyeler Scalese at 12/18/2011 09:42 AM  [name], [phone]

## 2011-12-18 NOTE — Progress Notes (Signed)
PT Cancellation Note  __x_Treatment cancelled today due to medical issues with patient which prohibited  Physical therapy  ___ Treatment cancelled today due to patient receiving procedure or test   ___ Treatment cancelled today due to patient's refusal to participate   ___ Treatment cancelled today due to  [phone] Blanchard Kelch, PT

## 2011-12-18 NOTE — Progress Notes (Signed)
Patient states he uses home nocturnal CPAP. At this time, he complains of abdominal discomfort. Abdomen noted to be distended. He also complains of marked nausea with some vomiting that "comes and goes" frequently. Spoke with him of the potential risks of aspiration and increased air to the stomach with the use of positive pressure. CPAP will not be worn tonight. Equipment is on standby at the bedside. RN aware.

## 2011-12-18 NOTE — Progress Notes (Signed)
Patient had gotten up to bathroom. As assisting patient back to bed ( pt using walker but wants to hold on to walker and bed/table as well to steady self). Patient also fussing at grandson about the sheets on the bed that 'those people need to go". . Very anxious.  patient very SOB, respirations 30/minute. Labored breathing. sats 88-89% on 2 L [place]. Increased oxygen to 3 liters. Patient wanting CPAP --patient states he uses at home as well as oxygen. Paged NP on call orders received.

## 2011-12-18 NOTE — Progress Notes (Signed)
The results of his flex sigmoidoscopy was noted and discussed with the patient and his family.  The plan is to arrange follow up as out patient (9/4 at 8:30) to discuss chemotherapy. He understands he will need to get stronger before we can can consider any treatments. PT/OT is working with the patient now.

## 2011-12-18 NOTE — Progress Notes (Signed)
IP PROGRESS NOTE  Subjective:   Patient feels a little better. His pain is improving and eating well this am. He is NPO for endoscopy.   Objective:  Vital signs in last 24 hours: Temp:  [97.7 F (36.5 C)-98.4 F (36.9 C)] 97.7 F (36.5 C) (08/16 0818) Pulse Rate:  [82-110] 99  (08/16 0620) Resp:  [18-28] 26  (08/16 0818) BP: (120-166)/(68-74) 120/74 mmHg (08/16 0818) SpO2:  [89 %-98 %] 94 % (08/16 0818) Weight:  [207 lb 3.2 oz (93.985 kg)] 207 lb 3.2 oz (93.985 kg) (08/16 0620) Weight change:  Last BM Date: 12/16/11  Intake/Output from previous day: 08/15 0701 - 08/16 0700 In: 766.2 [P.O.:360; I.V.:406.2] Out: 950 [Urine:950]  Mouth: mucous membranes moist, pharynx normal without lesions Resp: clear to auscultation bilaterally Cardio: regular rate and rhythm, S1, S2 normal, no murmur, click, rub or gallop GI: soft, non-tender; bowel sounds normal; no masses,  no organomegaly. Tender on palpation.  Extremities: extremities normal, atraumatic, no cyanosis or edema  Portacath/PICC-without erythema  Lab Results:  Basename 12/17/11 0423 12/16/11 0333  WBC 8.3 7.7  HGB 10.7* 11.5*  HCT 32.5* 35.0*  PLT 278 278    BMET  Basename 12/17/11 0423 12/16/11 0333  NA 136 138  K 3.5 3.6  CL 100 102  CO2 31 28  GLUCOSE 131* 142*  BUN 19 26*  CREATININE 0.92 0.93  CALCIUM 8.6 8.6    Studies/Results: No results found.  Medications: I have reviewed the patient's current medications.  Assessment/Plan:  This is a pleasant 78 year old gentleman with the following issues:   1. Advanced rectal carcinoma, presented with a T4 N2 disease in 2011. He presented with a complete obstruction. After segmental resection, he underwent radiation therapy with Xeloda and subsequently systemic chemotherapy. At this time, he has really no evidence of any recurrent disease, at least that is grossly evident. He has always been certainly at high risk of local recurrence, as well as systemic  recurrence. His last CT chest, abdomen, and pelvis showed Recurrent rectal tumor with associated 3.0 x 2.0 cm right perirectal nodal metastasis. No diffuse disease noted.   He will need salvage chemotherapy in the future as out patient.   We we arrange for follow up upon discharge.   2. Abdominal pain: this could be in addition to his cancer related to enteritis. Improving daily. Endoscopy today    3. Atrial fibrillation. INR levels are noted. Warfarin-induced coagulopathy being addressed by the primary team. It seems better at this time.   I will see him on 8/19 if he is still in the hospital.    LOS: 7 days   [facility] 12/18/2011, 8:25 AM

## 2011-12-18 NOTE — Progress Notes (Signed)
ANTICOAGULATION CONSULT NOTE  Pharmacy Consult for warfarin Indication: hx afib  No Known Allergies  Patient Measurements: Height: 5\' 11"  (180.3 cm) Weight: 207 lb 3.2 oz (93.985 kg) IBW/kg (Calculated) : 75.3   Vital Signs: Temp: 98.9 F (37.2 C) (08/16 1800) Temp src: Oral (08/16 1800) BP: 155/68 mmHg (08/16 1800) Pulse Rate: 108  (08/16 1800)  Labs:  Basename 12/18/11 0426 12/17/11 0423 12/16/11 0333 12/15/11 1956  HGB -- 10.7* 11.5* --  HCT -- 32.5* 35.0* 31.9*  PLT -- 278 278 --  APTT -- -- -- --  LABPROT 16.6* 18.5* 30.1* --  INR 1.32 1.51* 2.82* --  HEPARINUNFRC -- -- -- --  CREATININE -- 0.92 0.93 --  CKTOTAL -- -- -- --  CKMB -- -- -- --  TROPONINI -- -- -- --   Estimated Creatinine Clearance: 80 ml/min (by C-G formula based on Cr of 0.92).   Medications:     . ampicillin-sulbactam (UNASYN) IV  1.5 g Intravenous Q6H  . antiseptic oral rinse  15 mL Mouth Rinse q12n4p  . aspirin EC  81 mg Oral Daily  . chlorhexidine  15 mL Mouth Rinse BID  . digoxin  125 mcg Oral Daily  . enoxaparin (LOVENOX) injection  40 mg Subcutaneous Q24H  . ferrous sulfate  325 mg Oral Q breakfast  . Fluticasone-Salmeterol  1 puff Inhalation Q12H  . Ipratropium-Albuterol  1 puff Inhalation QID  . metFORMIN  500 mg Oral QHS  . metoCLOPramide (REGLAN) injection  10 mg Intravenous TID AC & HS  . omega-3 acid ethyl esters  1 g Oral Daily  . simvastatin  40 mg Oral QHS  . Warfarin - Pharmacist Dosing Inpatient   Does not apply q1800     Assessment:  78 yo M admitted 8/9 with abdominal pain, history of rectal cancer with recurrence and possible intra-abdominal abscess seen on CT scan.  PTA on chronic warfarin 5 mg daily, except on Monday and Friday 2.5mg , dose taken 8/8 PTA.   Coumadin 2.5mg  given 8/10 @0012   Had reports of rectal bleeding on admission, no further reports of bleeding, S/P Flex Sig - source of bleed was rectum. Noted at risk of rebleed if INR becomes  supratx.  Per Dr Benjamine Mola, Coumadin per pharmacy to be resumed tonight  INR subtherapeutic 1.32, FFP and vitamin K yesterday   Pt is on Lovenox 40mg  sq q24h  Pt is also on ASA 81mg  daily  Potential drug interaction with Unasyn (may increase INR)  Goal of Therapy:  INR 2-3   Plan:   Coumadin 1mg  today  Plan to d/c Lovenox 40mg  sq q24h when INR tx  Daily PT/INR  Gwen Her PharmD  [phone] 12/18/2011 7:58 PM

## 2011-12-18 NOTE — Progress Notes (Addendum)
Patient found sitting in bed with polo shirt on and hospital gown off, IVF's disconnected from [facility], and drainage bag disconnected from foley around 0350. Short of breath at rest. States "No one would bring me pain medication in the last few hours so I was going to leave." patient had not called desk for pain medication. Patient had actually been seen by staff  < 15 minutes prior. Respirations 28/minute. sats  >92% on 3 liters [place]. Given prn MDI's with relief. Patient currently asleep, drew labs from [facility] while patient asleep. Patient had been confused tonight at times. ? Medication related.

## 2011-12-19 ENCOUNTER — Inpatient Hospital Stay (HOSPITAL_COMMUNITY): Payer: Medicare Other

## 2011-12-19 LAB — CBC
MCH: 31.3 pg (ref 26.0–34.0)
MCV: 97.8 fL (ref 78.0–100.0)
Platelets: 365 10*3/uL (ref 150–400)
RDW: 14.6 % (ref 11.5–15.5)

## 2011-12-19 LAB — BASIC METABOLIC PANEL
CO2: 30 mEq/L (ref 19–32)
Calcium: 8.4 mg/dL (ref 8.4–10.5)
Creatinine, Ser: 1.09 mg/dL (ref 0.50–1.35)
GFR calc Af Amer: 74 mL/min — ABNORMAL LOW (ref >90)

## 2011-12-19 MED ORDER — WARFARIN SODIUM 1 MG PO TABS
1.0000 mg | ORAL_TABLET | Freq: Once | ORAL | Status: AC
  Administered 2011-12-19: 1 mg via ORAL
  Filled 2011-12-19: qty 1

## 2011-12-19 MED ORDER — PANTOPRAZOLE SODIUM 40 MG PO TBEC: 40.0000 mg | DELAYED_RELEASE_TABLET | Freq: Every day | ORAL | Status: DC

## 2011-12-19 MED ORDER — HYDROMORPHONE HCL PF 1 MG/ML IJ SOLN
0.5000 mg | INTRAMUSCULAR | Status: AC
  Administered 2011-12-19: 0.5 mg via INTRAVENOUS

## 2011-12-19 MED ORDER — PANTOPRAZOLE SODIUM 40 MG PO TBEC
40.0000 mg | DELAYED_RELEASE_TABLET | Freq: Every day | ORAL | Status: DC
  Administered 2011-12-19 – 2011-12-28 (×9): 40 mg via ORAL
  Filled 2011-12-19 (×10): qty 1

## 2011-12-19 NOTE — Progress Notes (Signed)
Pt awake and complains of aching that extends from chest to lower ABD. Patient reports he feels like he has a burning sour taste in his mouth. No diaphoresis or difficulty with breathing on appearance. However, patient reports he hurts in his chest when he takes a deep breath. Vital signs taken and charted. Will follow up.Marland Kitchen

## 2011-12-19 NOTE — Progress Notes (Signed)
Page to [facility], Georgia Dom, MD

## 2011-12-19 NOTE — Progress Notes (Signed)
Dr Kimberlee Nearing, called aware of EKG results. New orders.

## 2011-12-19 NOTE — Progress Notes (Signed)
Dr. Georgia Dom notified of heartrate and complaints of pain 9/10 ABD. New orders given

## 2011-12-19 NOTE — Progress Notes (Signed)
ANTICOAGULATION CONSULT NOTE  Pharmacy Consult for warfarin Indication: hx afib  No Known Allergies  Patient Measurements: Height: 5\' 11"  (180.3 cm) Weight: 207 lb 3.2 oz (93.985 kg) IBW/kg (Calculated) : 75.3   Vital Signs: Temp: 97.8 F (36.6 C) (08/17 0647) Temp src: Oral (08/17 0647) BP: 128/77 mmHg (08/17 0647) Pulse Rate: 79  (08/17 0647)  Labs:  Basename 12/19/11 0818 12/19/11 0528 12/18/11 0426 12/17/11 0423  HGB -- 11.3* -- 10.7*  HCT -- 35.3* -- 32.5*  PLT -- 365 -- 278  APTT -- -- -- --  LABPROT -- 16.6* 16.6* 18.5*  INR -- 1.32 1.32 1.51*  HEPARINUNFRC -- -- -- --  CREATININE 1.09 -- -- 0.92  CKTOTAL -- -- -- --  CKMB -- -- -- --  TROPONINI -- -- -- --   Estimated Creatinine Clearance: 67.5 ml/min (by C-G formula based on Cr of 1.09).   Medications:     . ampicillin-sulbactam (UNASYN) IV  1.5 g Intravenous Q6H  . antiseptic oral rinse  15 mL Mouth Rinse q12n4p  . aspirin EC  81 mg Oral Daily  . chlorhexidine  15 mL Mouth Rinse BID  . digoxin  125 mcg Oral Daily  . enoxaparin (LOVENOX) injection  40 mg Subcutaneous Q24H  . ferrous sulfate  325 mg Oral Q breakfast  . Fluticasone-Salmeterol  1 puff Inhalation Q12H  . Ipratropium-Albuterol  1 puff Inhalation QID  . metFORMIN  500 mg Oral QHS  . metoCLOPramide (REGLAN) injection  10 mg Intravenous TID AC & HS  . omega-3 acid ethyl esters  1 g Oral Daily  . simvastatin  40 mg Oral QHS  . warfarin  1 mg Oral Once  . Warfarin - Pharmacist Dosing Inpatient   Does not apply q1800     Assessment:  78 yo M admitted 8/9 with abdominal pain, history of rectal cancer with recurrence and possible intra-abdominal abscess seen on CT scan.  PTA on chronic warfarin 5 mg daily, except on Monday and Friday 2.5mg , dose taken 8/8 PTA.   Had reports of rectal bleeding on admission, no further reports of bleeding, S/P Flex Sig - source of bleed was rectum. Noted at risk of rebleed if INR becomes supratx.  Per Dr  Benjamine Mola, Coumadin per pharmacy to be resumed 8/16pm  Pt is s/p FFP and vitamin K 5mg  PO + 5mg  subcutaneously on 8/13   INR remains subtherapeutic at 1.32  Pt is on Lovenox 40mg  sq q24h and ASA 81mg  daily  Potential drug interaction with Unasyn (may increase INR)  Using lower warfarin doses due to risk of bleed and due to INR > 6 after one dose of 2.5mg  on 8/10.  Goal of Therapy:  INR 2-3   Plan:   Repeat Coumadin 1mg  today  Plan to d/c Lovenox 40mg  sq q24h when INR therapeutic  Daily PT/INR  Darrol Angel, PharmD Pager: [phone] 12/19/2011 8:51 AM

## 2011-12-19 NOTE — Progress Notes (Addendum)
Patient ID: Vincent [name], male   DOB: 14-May-1932, 78 y.o.   MRN: [id number] [facility] PROGRESS NOTE  Vincent [name] BJY:[id number] DOB: Oct 26, 1932 DOA: 12/11/2011 PCP: Aura Dials, MD  Brief narrative: 78 year old male with previous history of rectal carcinoma diagnosed in 2011 status post surgical resection and colostomy and radiation and chemotherapy who presented with complaints of abdominal pain and found  to have a recurrent carcinoma based on Ct abdomen/pelvis. Patient is status post flexible sigmoidoscopy and findings consistent with rectal carcinoma.  Assessment/Plan:  Principal Problem: *Rectal carcinoma with recurrence  - per Dr. Alver Fisher input-planning chemo as outpt.  - status post flex sig with findings of rectal carcinoma filling almost full rectal stomp  Active Problem: Rectal bleeding  - secondary to recurrent rectal carcinoma in the setting of supratherapeutic INR - s/p fresh frozen and vitamin K on 8/13  - appreciate GI and surgery following  Acute blood loss anemia - multifactorial, in the setting of supratherapeutic INR and rectal carcinoma - hemoglobin stable, 11.3  H/o PAF  - lovenox and coumadin per pharmacy  - watch for bleed  Intraabdominal abscess  - per CT findings - will continue empiric ABX-unasyn  - Appreciate surgery input- little to offer from surgical standpoint,  - Per surgery's note we are to consider CT at the end of antibiotic course.   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  Objective: Filed Vitals:   12/19/11 6962 12/19/11 0817 12/19/11 1221 12/19/11 1455  BP: 128/77   144/83  Pulse: 79   93  Temp: 97.8 F (36.6 C)   97.8 F (36.6 C)  TempSrc: Oral   Oral  Resp: 18   20  Height:      Weight:      SpO2: 97% 96% 96% 95%    Intake/Output Summary (Last 24 hours) at 12/19/11 1521 Last data filed at 12/19/11 1200  Gross per 24 hour  Intake    480 ml  Output    625 ml  Net   -145 ml    Exam:   General:  Pt is alert, follows commands appropriately, not in acute distress  Cardiovascular: Regular rate and rhythm, S1/S2, no murmurs, no rubs, no gallops  Respiratory: Clear to auscultation bilaterally, no wheezing, no crackles, no rhonchi  Abdomen: Soft, non tender, non distended, bowel sounds present, no guarding  Extremities: No edema, pulses DP and PT palpable bilaterally  Neuro: Grossly nonfocal  Data Reviewed: Basic Metabolic Panel:  Lab 12/19/11 9528 12/17/11 0423 12/16/11 0333 12/15/11 0330 12/14/11 0340  NA 139 136 138 135 132*  K 3.4* 3.5 3.6 3.8 3.8  CL 100 100 102 96 98  CO2 30 31 28 28 26   GLUCOSE 123* 131* 142* 164* 142*  BUN 34* 19 26* 26* 21  CREATININE 1.09 0.92 0.93 0.99 1.03  CALCIUM 8.4 8.6 8.6 8.9 8.5  MG -- -- -- -- --  PHOS -- -- -- -- --   Liver Function Tests: No results found for this basename: AST:5,ALT:5,ALKPHOS:5,BILITOT:5,PROT:5,ALBUMIN:5 in the last 168 hours  Lab 12/15/11 0330  LIPASE 20  AMYLASE --   No results found for this basename: AMMONIA:5 in the last 168 hours CBC:  Lab 12/19/11 0528 12/17/11 0423 12/16/11 0333 12/15/11 1956 12/15/11 1120 12/15/11 0330 12/14/11 0340  WBC 10.3 8.3 7.7 -- -- 12.8* 10.9*  NEUTROABS -- -- -- -- -- -- --  HGB 11.3* 10.7* 11.5* 10.8* 11.9* -- --  HCT 35.3* 32.5*  35.0* 31.9* 35.1* -- --  MCV 97.8 96.7 95.4 -- -- 94.0 95.2  PLT 365 278 278 -- -- 312 237   Cardiac Enzymes: No results found for this basename: CKTOTAL:5,CKMB:5,CKMBINDEX:5,TROPONINI:5 in the last 168 hours BNP: No components found with this basename: POCBNP:5 CBG:  Lab 12/18/11 0852  GLUCAP 166*    Recent Results (from the past 240 hour(s))  URINE CULTURE     Status: Normal   Collection Time   12/11/11  2:24 PM      Component Value Range Status Comment   Specimen Description URINE, RANDOM   Final    Special Requests NONE   Final    Culture  Setup Time 12/12/2011 02:01   Final    Colony Count 8,000 COLONIES/ML   Final    Culture INSIGNIFICANT GROWTH   Final    Report Status 12/13/2011 FINAL   Final      Studies: No results found.  Scheduled Meds:   . ampicillin-sulbactam (UNASYN) IV  1.5 g Intravenous Q6H  . aspirin EC  81 mg Oral Daily  . digoxin  125 mcg Oral Daily  . enoxaparin (LOVENOX)   40 mg Subcutaneous Q24H  . ferrous sulfate  325 mg Oral Q breakfast  . Fluticasone-Salmeterol  1 puff Inhalation Q12H  . Ipratropium-Albuterol  1 puff Inhalation QID  . metFORMIN  500 mg Oral QHS  . metoCLOPramide  10 mg Intravenous TID AC & HS  . omega-3  1 g Oral Daily  . pantoprazole  40 mg Oral Q1200  . simvastatin  40 mg Oral QHS  . warfarin  1 mg Oral Once   Continuous Infusions:   . sodium chloride 1,000 mL (12/18/11 0435)

## 2011-12-19 NOTE — Progress Notes (Signed)
Given 0.5mg  IV dilaudid for pain 9/10. Indicates upper mid ABD and states kind of feels like "heartburn". Will follow.

## 2011-12-19 NOTE — Progress Notes (Signed)
Heart rate increased 149 withe Dinamap. Apical Rechecked results 124. Heart rhythm regular at ausculation. Denies any problems other than pain level of 9, ABD. Treated earlier with PO pain medication with little result. Will follow.

## 2011-12-20 ENCOUNTER — Inpatient Hospital Stay (HOSPITAL_COMMUNITY): Payer: Medicare Other

## 2011-12-20 ENCOUNTER — Other Ambulatory Visit (HOSPITAL_COMMUNITY): Payer: Medicare Other

## 2011-12-20 DIAGNOSIS — D62 Acute posthemorrhagic anemia: Secondary | ICD-10-CM | POA: Diagnosis present

## 2011-12-20 LAB — BASIC METABOLIC PANEL
BUN: 23 mg/dL (ref 6–23)
Chloride: 100 mEq/L (ref 96–112)
GFR calc Af Amer: >90 mL/min (ref >90)
GFR calc non Af Amer: 84 mL/min — ABNORMAL LOW (ref >90)
Potassium: 3.3 mEq/L — ABNORMAL LOW (ref 3.5–5.1)
Sodium: 135 mEq/L (ref 135–145)

## 2011-12-20 LAB — PROTIME-INR
INR: 1.42 (ref 0.00–1.49)
Prothrombin Time: 17.6 s — ABNORMAL HIGH (ref 11.6–15.2)

## 2011-12-20 LAB — CBC
HCT: 32.7 % — ABNORMAL LOW (ref 39.0–52.0)
Hemoglobin: 10.7 g/dL — ABNORMAL LOW (ref 13.0–17.0)
MCHC: 32.7 g/dL (ref 30.0–36.0)
RDW: 14.8 % (ref 11.5–15.5)
WBC: 8.1 10*3/uL (ref 4.0–10.5)

## 2011-12-20 LAB — PREPARE FRESH FROZEN PLASMA: Unit division: 0

## 2011-12-20 MED ORDER — DILTIAZEM HCL 30 MG PO TABS
30.0000 mg | ORAL_TABLET | Freq: Three times a day (TID) | ORAL | Status: DC
  Administered 2011-12-21 – 2011-12-28 (×16): 30 mg via ORAL
  Filled 2011-12-20 (×28): qty 1

## 2011-12-20 MED ORDER — ATORVASTATIN CALCIUM 20 MG PO TABS
20.0000 mg | ORAL_TABLET | Freq: Every day | ORAL | Status: DC
  Administered 2011-12-20 – 2011-12-27 (×8): 20 mg via ORAL
  Filled 2011-12-20 (×9): qty 1

## 2011-12-20 MED ORDER — WARFARIN SODIUM 2 MG PO TABS
2.0000 mg | ORAL_TABLET | Freq: Once | ORAL | Status: AC
  Administered 2011-12-20: 2 mg via ORAL
  Filled 2011-12-20: qty 1

## 2011-12-20 NOTE — Progress Notes (Signed)
Phone call to Vernona Rieger. [place], [place].  Reported patients complaints of pain that extends from chest to ABD worse with inspiration per patient. Vital signs given. Will place new orders

## 2011-12-20 NOTE — Progress Notes (Signed)
[facility] PROGRESS NOTE  Vincent [name] RUE:[id number] DOB: May 22, 1932 DOA: 12/11/2011 PCP: Aura Dials, MD  Brief narrative: 78 year old male with previous history of rectal carcinoma (diagnosed in 2011) status post surgical resection and colostomy as well as radiation and chemotherapy who presented 12/11/2011 with complaints of abdominal and found to have a recurrent carcinoma with nodal metastasesbased on CT  abdomen/pelvis. Patient is status post flexible sigmoidoscopy 12/18/2011 with findings consistent with rectal carcinoma.   Assessment/Plan:   Principal Problem:  *Abdominal pain in the setting of recurrent rectal carcinoma with nodal metastases - per Dr. Clelia Croft of oncology plan for outpatient chemotherapy  - status post flex sig with findings of rectal carcinoma filling almost full rectal stomp  - PAIN regimen: dilaudid 0.5-1 mg Q 3 hours PRN IV for severe pain and Roxicodone 5 mg Q 4 hours PRN moderate pain - promethazine PRN nausea and/or vomiting  Active Problem:  Lower GI bleed, rectal bleed - secondary to recurrent rectal carcinoma in the setting of supratherapeutic INR  - s/p fresh frozen and vitamin K on 8/13  - appreciate GI and surgery following   Acute blood loss anemia  - multifactorial, due to rectal bleed in the setting of supratherapeutic INR and rectal carcinoma  - hemoglobin stable, 11.3   Atrial fibrillation - lovenox and coumadin per pharmacy  - hemoglobin and platelet count stable - continue digoxin - added Cardizem 30 mg Q 8 hours PO for better rate control  Intraabdominal abscess  - per CT findings; ~ 3.5 cm fluid collection in left pelvis  - will continue empiric ABX-unasyn  - Appreciate surgery input- little to offer from surgical standpoint,  - Per surgery's note we are to consider CT at the end of antibiotic course.   Thrombocytopenia  - secondary to malignancy  - resolved   Hyponatremia  - 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 recurrent metastatic  rectal carcinoma  - resolved   Weakness generalized  - secondary to metastatic rectal carcinoma  - PT evaluation   Hypokalemia  - repleted  - follow up BMP in am   Code Status: full code  Family Communication: none at bedside  Disposition Plan: home when stable  Consultants:  Heme/onc  GI  Surgery  Procedures:  Flexible sigmoidoscopy 12/18/2011  Antibiotics:  Unasyn 12/11/2011 -->  Manson Passey, MD  [facility] Pager [phone]  If 7PM-7AM, please contact night-coverage [email or web address] Password [email or web address] 12/20/2011, 11:15 AM   LOS: 9 days   HPI/Subjective: Patient developed tachycardia last night, EKG showed Atrial fibrillation. Patient is comfortable at this time with no complaints of chest pain, no palpitations and no shortness of breath.  Objective: Filed Vitals:   12/19/11 2320 12/20/11 0145 12/20/11 0548 12/20/11 0800  BP: 125/69 144/66 123/72   Pulse: 91 91 93   Temp: 98.3 F (36.8 C) 98.1 F (36.7 C) 97.7 F (36.5 C)   TempSrc: Oral Oral Oral   Resp: 20 19 18    Height:      Weight:      SpO2: 97% 96% 95% 97%    Intake/Output Summary (Last 24 hours) at 12/20/11 1115 Last data filed at 12/20/11 0548  Gross per 24 hour  Intake 3170.33 ml  Output   1550 ml  Net 1620.33 ml    Exam:   General:  Pt is alert, follows commands appropriately, not in acute distress  Cardiovascular: irregular rhythm, tachycardic, S1/S2, no murmurs, no rubs, no gallops  Respiratory: Clear to auscultation bilaterally, no  wheezing, no crackles, no rhonchi  Abdomen: Soft, non tender, non distended, bowel sounds present, (+) colostomy bad in place  Extremities: No edema, pulses DP and PT palpable bilaterally  Neuro: Grossly nonfocal  Data Reviewed: Basic Metabolic Panel:  Lab 12/19/11 6962 12/17/11 0423 12/16/11 0333 12/15/11 0330 12/14/11 0340  NA 139 136 138 135 132*  K 3.4* 3.5 3.6 3.8 3.8  CL 100 100 102 96 98  CO2 30 31 28 28 26   GLUCOSE 123* 131*  142* 164* 142*  BUN 34* 19 26* 26* 21  CREATININE 1.09 0.92 0.93 0.99 1.03  CALCIUM 8.4 8.6 8.6 8.9 8.5    Lab 12/15/11 0330  LIPASE 20  AMYLASE --   CBC:  Lab 12/19/11 0528 12/17/11 0423 12/16/11 0333 12/15/11 0330 12/14/11 0340  WBC 10.3 8.3 7.7 12.8* 10.9*  HGB 11.3* 10.7* 11.5* -- --  HCT 35.3* 32.5* 35.0* -- --  MCV 97.8 96.7 95.4 94.0 95.2  PLT 365 278 278 312 237   CBG:  Lab 12/18/11 0852  GLUCAP 166*    URINE CULTURE     Status: Normal   Collection Time   12/11/11  2:24 PM      Component Value Range Status Comment   Specimen Description URINE, RANDOM   Final    Colony Count 8,000 COLONIES/ML   Final    Culture INSIGNIFICANT GROWTH   Final    Report Status 12/13/2011 FINAL   Final      Studies: Dg Chest 1 View 12/20/2011  *  IMPRESSION: Bronchitic changes.  Left base atelectasis.    Dg Abd 2 Views 12/20/2011  *  IMPRESSION: Gaseous distension of stomach and small bowel suggesting small bowel obstruction.   Small bowel wall thickening suggested in the mid abdomen.     Scheduled Meds:   . ampicillin-sulbactam   1.5 g Intravenous Q6H  . aspirin EC  81 mg Oral Daily  . atorvastatin  20 mg Oral q1800  . digoxin  125 mcg Oral Daily  . diltiazem  30 mg Oral Q8H  . enoxaparin (LOVENOX)   40 mg Subcutaneous Q24H  . ferrous sulfate  325 mg Oral Q breakfast  . Fluticasone-Salmeterol  1 puff Inhalation Q12H  . Ipratropium-Albuterol  1 puff Inhalation QID  . metFORMIN  500 mg Oral QHS  . metoCLOPramide  10 mg Intravenous TID AC & HS  . omega-3 acid   1 g Oral Daily  . pantoprazole  40 mg Oral Q1200  . warfarin  1 mg Oral ONCE-1800

## 2011-12-20 NOTE — Progress Notes (Addendum)
Radiology verified order was not recognized last night. Requested pt come downstairs this am. Pt requested portable ABD xray instead of going down to radiology. Order changed to 2 view to be performed portable secondary to pts refusal to go downstairs.

## 2011-12-20 NOTE — Progress Notes (Signed)
Pt reports his belly, kidneys "all over is hurting". Calm at present. No signs of SOB. Pulse decreased to 90. Pulse ox at 96%. Rates pain 10/10.

## 2011-12-20 NOTE — Progress Notes (Signed)
Calm, family at bedside. Reports pain has decreased to 7/10. Family is currently shaving patient with Neurosurgeon.

## 2011-12-20 NOTE — Progress Notes (Signed)
Pt. States he does not need to use nocturnal cpap at this time, he states he is fine with his O2 on /24 hrs & continue with MDIs as ordered.  Pt with continuous POX at bedside to monitor.

## 2011-12-20 NOTE — Progress Notes (Signed)
Pt with complaints of pain that extends from his chest to lower ABD. No diaphoresis, and respirations even and unlabored on appearance. Rates pain 9/10 "all over". Reports he feels like he has a sour taste in his mouth. Abd distended however denies pain with light palpation.  Vitals signs take and charted. Given prn for indigestion.

## 2011-12-20 NOTE — Progress Notes (Signed)
Attempts made to contact Radiology. RN wanted to verify why ABD 1 view was not performed yet.

## 2011-12-20 NOTE — Progress Notes (Addendum)
The order for simvastatin (Zocor) was changed to an equivalent dose of atorvastatin (Lipitor) due to the potential drug interaction with diltiazem.  When taken in combination with medications that inhibit its metabolism, simvastatin can accumulate which increases the risk of liver toxicity, myopathy, or rhabdomyolysis.  Simvastatin dose should not exceed 10mg /day in patients taking verapamil, diltiazem, fibrates, or niacin >or= 1g/day.   Simvastatin dose should not exceed 20mg /day in patients taking amlodipine, ranolazine or amiodarone.   Please consider this potential interaction at discharge.  Lodema Hong 12/20/2011 9:05 AM Pharmacy [phone]

## 2011-12-20 NOTE — Progress Notes (Signed)
ANTICOAGULATION CONSULT NOTE  Pharmacy Consult for warfarin Indication: hx afib  No Known Allergies  Patient Measurements: Height: 5\' 11"  (180.3 cm) Weight: 207 lb 3.2 oz (93.985 kg) IBW/kg (Calculated) : 75.3   Vital Signs: Temp: 97.7 F (36.5 C) (08/18 0548) Temp src: Oral (08/18 0548) BP: 123/72 mmHg (08/18 0548) Pulse Rate: 93  (08/18 0548)  Labs:  Basename 12/20/11 0530 12/19/11 0818 12/19/11 0528 12/18/11 0426  HGB -- -- 11.3* --  HCT -- -- 35.3* --  PLT -- -- 365 --  APTT -- -- -- --  LABPROT 17.6* -- 16.6* 16.6*  INR 1.42 -- 1.32 1.32  HEPARINUNFRC -- -- -- --  CREATININE -- 1.09 -- --  CKTOTAL -- -- -- --  CKMB -- -- -- --  TROPONINI -- -- -- --   Estimated Creatinine Clearance: 67.5 ml/min (by C-G formula based on Cr of 1.09).   Medications:     . ampicillin-sulbactam (UNASYN) IV  1.5 g Intravenous Q6H  . antiseptic oral rinse  15 mL Mouth Rinse q12n4p  . aspirin EC  81 mg Oral Daily  . chlorhexidine  15 mL Mouth Rinse BID  . digoxin  125 mcg Oral Daily  . enoxaparin (LOVENOX) injection  40 mg Subcutaneous Q24H  . ferrous sulfate  325 mg Oral Q breakfast  . Fluticasone-Salmeterol  1 puff Inhalation Q12H  .  HYDROmorphone (DILAUDID) injection  0.5 mg Intravenous NOW  .  HYDROmorphone (DILAUDID) injection  0.5 mg Intravenous NOW  . Ipratropium-Albuterol  1 puff Inhalation QID  . metFORMIN  500 mg Oral QHS  . metoCLOPramide (REGLAN) injection  10 mg Intravenous TID AC & HS  . omega-3 acid ethyl esters  1 g Oral Daily  . pantoprazole  40 mg Oral Q1200  . simvastatin  40 mg Oral QHS  . warfarin  1 mg Oral ONCE-1800  . Warfarin - Pharmacist Dosing Inpatient   Does not apply q1800  . DISCONTD: pantoprazole  40 mg Oral Q1200     Assessment:  78 yo M admitted 8/9 with abdominal pain, history of rectal cancer with recurrence and possible intra-abdominal abscess seen on CT scan.  PTA on chronic warfarin 5 mg daily, except on Monday and Friday 2.5mg ,  dose taken 8/8 PTA.   Had reports of rectal bleeding on admission, no further reports of bleeding, S/P Flex Sig - source of bleed was rectum. Noted at risk of rebleed if INR becomes supratx.  Per Dr Benjamine Mola, Coumadin per pharmacy to be resumed 8/16pm  Pt is s/p FFP and vitamin K 5mg  PO and 5mg  subcutaneously on 8/13   INR remains subtherapeutic but starting to trend back up  Pt is on Lovenox 40mg  sq q24h and ASA 81mg  daily  Potential drug interaction with Unasyn (may increase INR)  Using lower warfarin doses due to risk of bleed and due to INR > 6 after one dose of 2.5mg  on 8/10.  Pt consuming FLD.  Goal of Therapy:  INR 2-3   Plan:   Warfarin 2mg  today  Plan to d/c Lovenox 40mg  sq q24h when INR therapeutic  Daily PT/INR  Darrol Angel, PharmD Pager: [phone] 12/20/2011 8:01 AM

## 2011-12-21 ENCOUNTER — Encounter (HOSPITAL_COMMUNITY): Payer: Self-pay | Admitting: Internal Medicine

## 2011-12-21 ENCOUNTER — Inpatient Hospital Stay (HOSPITAL_COMMUNITY): Payer: Medicare Other

## 2011-12-21 ENCOUNTER — Telehealth: Payer: Self-pay | Admitting: *Deleted

## 2011-12-21 DIAGNOSIS — D62 Acute posthemorrhagic anemia: Secondary | ICD-10-CM

## 2011-12-21 LAB — BASIC METABOLIC PANEL
BUN: 20 mg/dL (ref 6–23)
CO2: 32 mEq/L (ref 19–32)
Calcium: 8.4 mg/dL (ref 8.4–10.5)
Chloride: 100 mEq/L (ref 96–112)
Creatinine, Ser: 0.84 mg/dL (ref 0.50–1.35)
GFR calc Af Amer: >90 mL/min (ref >90)
GFR calc non Af Amer: 83 mL/min — ABNORMAL LOW (ref >90)
Glucose, Bld: 116 mg/dL — ABNORMAL HIGH (ref 70–99)
Potassium: 3.6 mEq/L (ref 3.5–5.1)
Sodium: 136 mEq/L (ref 135–145)

## 2011-12-21 LAB — CBC
Platelets: 368 10*3/uL (ref 150–400)
RBC: 3.72 MIL/uL — ABNORMAL LOW (ref 4.22–5.81)
RDW: 14.6 % (ref 11.5–15.5)
WBC: 9.9 10*3/uL (ref 4.0–10.5)

## 2011-12-21 LAB — PROTIME-INR
INR: 1.44 (ref 0.00–1.49)
Prothrombin Time: 17.8 seconds — ABNORMAL HIGH (ref 11.6–15.2)

## 2011-12-21 MED ORDER — WARFARIN SODIUM 3 MG PO TABS
3.0000 mg | ORAL_TABLET | Freq: Once | ORAL | Status: AC
  Administered 2011-12-21: 3 mg via ORAL
  Filled 2011-12-21: qty 1

## 2011-12-21 MED ORDER — IOHEXOL 300 MG/ML  SOLN
100.0000 mL | Freq: Once | INTRAMUSCULAR | Status: AC | PRN
  Administered 2011-12-21: 100 mL via INTRAVENOUS

## 2011-12-21 NOTE — Progress Notes (Signed)
Pt found up at side of bed. Unsteady on feet. States " Theses dam cords have me all tangled up". Assisted back to side of bed. Pt reports he is ready to go home. Made several comments about health and that he knows he is dying even if no one has said it directly too. RN discussed POC and gave emotional support.

## 2011-12-21 NOTE — Progress Notes (Signed)
ANTICOAGULATION CONSULT NOTE  Pharmacy Consult for warfarin Indication: hx afib  No Known Allergies  Patient Measurements: Height: 5\' 11"  (180.3 cm) Weight: 207 lb 3.2 oz (93.985 kg) IBW/kg (Calculated) : 75.3   Vital Signs: Temp: 98.3 F (36.8 C) (08/19 0518) Temp src: Oral (08/19 0518) BP: 126/75 mmHg (08/19 0518) Pulse Rate: 80  (08/19 0518)  Labs:  Basename 12/21/11 0355 12/20/11 1239 12/20/11 0530 12/19/11 0818 12/19/11 0528  HGB 11.7* 10.7* -- -- --  HCT 36.3* 32.7* -- -- 35.3*  PLT 368 296 -- -- 365  APTT -- -- -- -- --  LABPROT 17.8* -- 17.6* -- 16.6*  INR 1.44 -- 1.42 -- 1.32  HEPARINUNFRC -- -- -- -- --  CREATININE 0.84 0.81 -- 1.09 --  CKTOTAL -- -- -- -- --  CKMB -- -- -- -- --  TROPONINI -- -- -- -- --   Estimated Creatinine Clearance: 87.6 ml/min (by C-G formula based on Cr of 0.84).   Medications:     . ampicillin-sulbactam (UNASYN) IV  1.5 g Intravenous Q6H  . antiseptic oral rinse  15 mL Mouth Rinse q12n4p  . aspirin EC  81 mg Oral Daily  . atorvastatin  20 mg Oral q1800  . chlorhexidine  15 mL Mouth Rinse BID  . digoxin  125 mcg Oral Daily  . diltiazem  30 mg Oral Q8H  . enoxaparin (LOVENOX) injection  40 mg Subcutaneous Q24H  . ferrous sulfate  325 mg Oral Q breakfast  . Fluticasone-Salmeterol  1 puff Inhalation Q12H  . Ipratropium-Albuterol  1 puff Inhalation QID  . metFORMIN  500 mg Oral QHS  . metoCLOPramide (REGLAN) injection  10 mg Intravenous TID AC & HS  . omega-3 acid ethyl esters  1 g Oral Daily  . pantoprazole  40 mg Oral Q1200  . warfarin  2 mg Oral ONCE-1800  . Warfarin - Pharmacist Dosing Inpatient   Does not apply q1800   Assessment:  78 yo M admitted 8/9 with abdominal pain, history of rectal cancer with recurrence and possible intra-abdominal abscess seen on CT scan.  PTA on chronic warfarin 5 mg daily, except on Monday and Friday 2.5mg , dose taken 8/8 PTA.   Had reports of rectal bleeding on admission, no further  reports of bleeding, S/P Flex Sig - source of bleed was rectum. Noted at risk of rebleed if INR becomes supratx.  Per Dr Benjamine Mola, Coumadin per pharmacy to be resumed 8/16pm  Pt is s/p FFP and vitamin K 5mg  PO and 5mg  subcutaneously on 8/13   Pt is on Lovenox 40mg  sq q24h and ASA 81mg  daily  Potential drug interaction with Unasyn (may increase INR)  Using lower warfarin doses due to risk of bleed and due to INR > 6 after one dose of 2.5mg  on 8/10.  Regular diet now.  INR remains subtherapeutic but rising slowly after 1mg , 1mg , 2mg .  Goal of Therapy:  INR 2-3   Plan:   Warfarin 3mg  today, cautiously.  Plan to d/c Lovenox 40mg  sq q24h when INR therapeutic  Daily PT/INR  Charolotte Eke, PharmD, pager [phone]. 12/21/2011,10:01 AM.

## 2011-12-21 NOTE — Telephone Encounter (Signed)
left voice message to inform the patient of the new date and time on 01-06-2012 at 8:00am with labs

## 2011-12-21 NOTE — Progress Notes (Addendum)
[facility] PROGRESS NOTE  Vincent [name]:[id number] DOB: 07/18/1932 DOA: 12/11/2011 PCP: Aura Dials, MD Brief narrative:  78 year old male with previous history of rectal carcinoma (diagnosed in 2011) status post surgical resection and colostomy as well as radiation and chemotherapy who presented 12/11/2011 with complaints of abdominal and found to have a recurrent carcinoma with nodal metastases based on CT abdomen/pelvis. Patient is status post flexible sigmoidoscopy 12/18/2011 with findings consistent with rectal carcinoma.  Assessment/Plan: *Abdominal pain in the setting of recurrent rectal carcinoma with nodal metastases  - per Dr. Clelia Croft of oncology plan for outpatient chemotherapy  - status post flex sig with findings of rectal carcinoma filling almost full rectal stomp  - pain well controlled on prn dilaudid and oxy IR  Active Problem:  Lower GI bleed, rectal bleed  - secondary to recurrent rectal carcinoma in the setting of supratherapeutic INR  - s/p fresh frozen and vitamin K on 8/13  - appreciate GI and surgery recs  -warfarin resumed with lovenox bridging. H&H stable  Acute blood loss anemia  - multifactorial, due to rectal bleed in the setting of supratherapeutic INR and rectal carcinoma  - hemoglobin stable at 11.7  Atrial fibrillation  - lovenox and coumadin per pharmacy  - hemoglobin and platelet count stable  - continue digoxin  - added Cardizem 30 mg Q 8 hours PO for better rate control  -follows with Dr berry [facility]) as outpt -will d/c ASA given no clear indication and increase risk for further rectal bleed in association with coumadin.  Intraabdominal abscess  - per CT findings; ~ 3.5 cm fluid collection in left pelvis concerning for small abscesseses - on empiric ABX-unasyn  ( since 8/9). Plan to treat for 10-14 day course. -little to offer from surgical standpoint,  - Per surgery's note consider CT at the end of antibiotic course. Will get repeat  CT today .  Will decide total abx following CT results  Thrombocytopenia  - secondary to malignancy  - resolved   Hyponatremia  - 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 recurrent metastatic rectal carcinoma  - resolved   Weakness generalized  - secondary to metastatic rectal carcinoma  - PT evaluation pending. Refuses to go to SNF  Hypokalemia  - repleted       Code Status: full code Family Communication: Family at bedside Disposition Plan: PT eval pending.  patient refuses to go to SNF and wants to home. Pending repeat CT of the abdomen today. Will  be re evaluated by PT in the morning.     Consultants:  Heme/onc  GI  Surgery  Procedures:  Flexible sigmoidoscopy 12/18/2011   Antibiotics:  Unasyn 12/11/2011 -->   HPI/Subjective: Feels better overall  Objective: Filed Vitals:   12/21/11 0518 12/21/11 0802 12/21/11 1130 12/21/11 1232  BP: 126/75  123/69   Pulse: 80  99   Temp: 98.3 F (36.8 C)  97.9 F (36.6 C)   TempSrc: Oral  Oral   Resp: 18  20   Height:      Weight:      SpO2: 97% 97% 98% 98%    Intake/Output Summary (Last 24 hours) at 12/21/11 1507 Last data filed at 12/21/11 0900  Gross per 24 hour  Intake    600 ml  Output   1200 ml  Net   -600 ml   Filed Weights   12/11/11 2124 12/18/11 0620  Weight: 91.445 kg (201 lb 9.6 oz) 93.985 kg (207 lb 3.2 oz)  Exam:   General:  Elderly male lying in bed in no acute distress  HEENT: No pallor moist oral mucosa  Cardiovascular: Normal S1 and S2 no murmurs rub or gallop  Respiratory: Clear bilaterally, no added sounds  Abdomen: Soft, colostomy Bag in Place , Foley in place  Extremities warm no edema  CNS AAO x3 nonfocal  Data Reviewed: Basic Metabolic Panel:  Lab 12/21/11 1610 12/20/11 1239 12/19/11 0818 12/17/11 0423 12/16/11 0333  NA 136 135 139 136 138  K 3.6 3.3* 3.4* 3.5 3.6  CL 100 100 100 100 102  CO2 32 29 30 31 28   GLUCOSE 116* 120* 123* 131* 142*  BUN 20 23 34* 19 26*  CREATININE  0.84 0.81 1.09 0.92 0.93  CALCIUM 8.4 7.9* 8.4 8.6 8.6  MG -- -- -- -- --  PHOS -- -- -- -- --   Liver Function Tests: No results found for this basename: AST:5,ALT:5,ALKPHOS:5,BILITOT:5,PROT:5,ALBUMIN:5 in the last 168 hours  Lab 12/15/11 0330  LIPASE 20  AMYLASE --   No results found for this basename: AMMONIA:5 in the last 168 hours CBC:  Lab 12/21/11 0355 12/20/11 1239 12/19/11 0528 12/17/11 0423 12/16/11 0333  WBC 9.9 8.1 10.3 8.3 7.7  NEUTROABS -- -- -- -- --  HGB 11.7* 10.7* 11.3* 10.7* 11.5*  HCT 36.3* 32.7* 35.3* 32.5* 35.0*  MCV 97.6 97.9 97.8 96.7 95.4  PLT 368 296 365 278 278   Cardiac Enzymes: No results found for this basename: CKTOTAL:5,CKMB:5,CKMBINDEX:5,TROPONINI:5 in the last 168 hours BNP (last 3 results) No results found for this basename: PROBNP:3 in the last 8760 hours CBG:  Lab 12/18/11 0852  GLUCAP 166*    No results found for this or any previous visit (from the past 240 hour(s)).   Studies: Dg Chest 1 View  12/20/2011  *RADIOLOGY REPORT*  Clinical Data: Chest pain, hypertension.  CHEST - 1 VIEW  Comparison: 12/11/2011  Findings: Left Port-A-Cath remains in place, unchanged.  Heart is normal size.  Mild peribronchial thickening and left base atelectasis.  No effusions.  No acute bony abnormality.  IMPRESSION: Bronchitic changes.  Left base atelectasis.  Original Report Authenticated By: Cyndie Chime, M.D.   Dg Chest 2 View  12/11/2011  *RADIOLOGY REPORT*  Clinical Data: Shortness of breath.  Generalized weakness. Abdominal pain.  History of colorectal cancer.  CHEST - 2 VIEW  Comparison: CT chest 04/09/2011, 10/20/2010, and portable chest x- ray 06/04/2010.  Findings: Cardiac silhouette normal in size, unchanged.  Thoracic aorta mildly atherosclerotic, unchanged.  Hilar and mediastinal contours otherwise unremarkable.  Lungs mildly hyperinflated but clear.  Bronchovascular markings normal.  No pleural effusions. Degenerative changes involving the  thoracic spine.  Left subclavian Port-A-Cath tip in the mid SVC.  Degenerative changes involving the right shoulder.  IMPRESSION: Mild hyperinflation consistent with COPD and/or asthma.  No acute cardiopulmonary disease.  Stable examination.  Original Report Authenticated By: Arnell Sieving, M.D.   Ct Chest W Contrast  12/13/2011  *RADIOLOGY REPORT*  Clinical Data: Restaging for metastases.  Recurrent rectal cancer.  CT CHEST WITH CONTRAST  Technique:  Multidetector CT imaging of the chest was performed following the standard protocol during bolus administration of intravenous contrast.  Contrast: OMNIPAQUE IOHEXOL 300 MG/ML  SOLN  Comparison: CT chest dated 04/09/2011  Findings: Trace bilateral pleural effusions with minimal dependent atelectasis in the posterior right upper and bilateral lower lobes. No suspicious pulmonary nodules.  No pleural effusion or pneumothorax.  Visualized thyroid is unremarkable.  The  heart is normal in size.  No pericardial effusion.  Mild atherosclerotic calcifications of the aortic arch.  Small mediastinal lymph nodes measuring up to 8 mm short axis, which do not meet pathologic CT size criteria.  No suspicious mediastinal or axillary lymphadenopathy.  Left chest port.  Visualized upper abdomen is notable for multiple tiny probable hepatic cysts, better visualized on recent CT abdomen pelvis.  Mild degenerative changes of the visualized thoracolumbar spine.  IMPRESSION: No evidence of metastatic disease in the chest.  Trace bilateral pleural effusions with associated minimal dependent atelectasis.  Original Report Authenticated By: Charline Bills, M.D.   Ct Abdomen Pelvis W Contrast  12/11/2011  *RADIOLOGY REPORT*  Clinical Data: Abdominal pain, hematuria, history of colon cancer status post resection in 2011  CT ABDOMEN AND PELVIS WITH CONTRAST  Technique:  Multidetector CT imaging of the abdomen and pelvis was performed following the standard protocol during bolus  administration of intravenous contrast.  Contrast: OMNIPAQUE IOHEXOL 300 MG/ML  SOLN  Comparison: 04/09/2011  Findings: Lung bases are clear.  Multiple probable hepatic cysts, unchanged.  Spleen, pancreas, and adrenal glands within normal limits.  Gallbladder is unremarkable.  No intrahepatic or extrahepatic ductal dilatation.  1.4 cm posterior interpolar left renal cyst (series 2/image 27). Right kidney is unremarkable.  No hydronephrosis.  No evidence of bowel obstruction.  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  Normal appendix.  Left lower quadrant colostomy.  Moderate left parastomal hernia containing fat and a loop of nondilated small bowel (series 2/image 39).  Again seen is rectal wall thickening, likely mildly progressed (series 2/image 70), suspicious for recurrent tumor.  Adjacent 3.0 x 2.0 cm right perirectal node (series 2/image 61), previously 8 mm, compatible with nodal metastasis.  3.3 x 2.4 cm fluid collection arising from the left rectal wall (series 2/image 66).  Adjacent/contiguous 3.5 x 2.0 cm fluid collection in the left pelvis (series 2/image 60), with associated thin enhancing rim, worrisome for possible abscess.  Bilobed infrarenal abdominal aortic aneurysm measuring 3.4 and 3.7 cm in AP diameter.  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 Indwelling Foley catheter.  Prostate is unremarkable.  Direct extension of tumor into the right seminal vesicle is difficult to exclude (series 4/image 22).  Degenerative changes of the visualized thoracolumbar spine.  IMPRESSION: Recurrent rectal tumor with associated 3.0 x 2.0 cm right perirectal nodal metastasis.  Additional left pelvic/perirectal fluid collections, as described above, worrisome for possible small abscess.  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  Left lower  quadrant colostomy.  Parastomal hernia containing fat and a loop of nondilated small bowel.  No evidence of bowel obstruction.  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 Indwelling Foley catheter.  Original Report Authenticated By: Charline Bills, M.D.   Dg Abd 2 Views  12/20/2011  *RADIOLOGY REPORT*  Clinical Data: Abdominal pain.  ABDOMEN - 2 VIEW  Comparison: CT abdomen 12/11/2011  Findings: Marked gaseous distension of the stomach.  Moderate gaseous distension of small bowel.  Air-fluid levels on the decubitus view.  Changes are consistent with small bowel obstruction.  Some small bowel loops appear to demonstrate wall thickening.  This could be due to edema or inflammatory process. No free intra-abdominal air.  Degenerative changes in the spine.  IMPRESSION: Gaseous distension of stomach and small bowel suggesting small bowel obstruction.   Small bowel wall thickening suggested in the  mid abdomen.  Original Report Authenticated By: Marlon Pel, M.D.    Scheduled Meds:   . ampicillin-sulbactam (UNASYN) IV  1.5 g Intravenous Q6H  . antiseptic oral rinse  15 mL Mouth Rinse q12n4p  . aspirin EC  81 mg Oral Daily  . atorvastatin  20 mg Oral q1800  . chlorhexidine  15 mL Mouth Rinse BID  . digoxin  125 mcg Oral Daily  . diltiazem  30 mg Oral Q8H  . enoxaparin (LOVENOX) injection  40 mg Subcutaneous Q24H  . ferrous sulfate  325 mg Oral Q breakfast  . Fluticasone-Salmeterol  1 puff Inhalation Q12H  . Ipratropium-Albuterol  1 puff Inhalation QID  . metFORMIN  500 mg Oral QHS  . metoCLOPramide (REGLAN) injection  10 mg Intravenous TID AC & HS  . omega-3 acid ethyl esters  1 g Oral Daily  . pantoprazole  40 mg Oral Q1200  . warfarin  2 mg Oral ONCE-1800  . warfarin  3 mg Oral ONCE-1800  . Warfarin - Pharmacist Dosing Inpatient   Does not apply q1800   Continuous Infusions:     Time spent: 25 minutes    Eddie North  [facility] Pager  [phone]*. If 8PM-8AM, please contact night-coverage at [email or web address], password [facility] 12/21/2011, 3:07 PM  LOS: 10 days            Discussed with radiologist on increased size of abscess on repeat CT today. informed surgeon on call. Patient not in distress and doesn't  appear to be having active infection clinically. Will monitor. Dr Gerrit Friends to see pt in am

## 2011-12-21 NOTE — Progress Notes (Signed)
PT Cancellation Note  Treatment cancelled today due to patient receiving procedure or test.  Pt needs colostomy changed, then going to MRI. Pt states he will be agreeable to walk in AM  Donnetta Hail 12/21/2011, 1:21 PM

## 2011-12-22 ENCOUNTER — Encounter (HOSPITAL_COMMUNITY): Payer: Self-pay | Admitting: Radiology

## 2011-12-22 DIAGNOSIS — K651 Peritoneal abscess: Secondary | ICD-10-CM | POA: Clinically undetermined

## 2011-12-22 LAB — PROTIME-INR
INR: 1.97 — ABNORMAL HIGH (ref 0.00–1.49)
Prothrombin Time: 22.8 seconds — ABNORMAL HIGH (ref 11.6–15.2)

## 2011-12-22 MED ORDER — WARFARIN SODIUM 2 MG PO TABS
2.0000 mg | ORAL_TABLET | Freq: Once | ORAL | Status: DC
  Filled 2011-12-22: qty 1

## 2011-12-22 NOTE — Progress Notes (Signed)
Physical Therapy Treatment Patient Details Name: Vincent [name] MRN: [id number] DOB: [birth year] Today's Date: 12/22/2011 Time: 1010-1035 PT Time Calculation (min): 25 min  PT Assessment / Plan / Recommendation Comments on Treatment Session  Pt states he feels better today, very little ABD pain/discomfort. Awaiting proceedure to drain ABD abcess when INR therapeutic. Pt plans to return home.    Follow Up Recommendations    none   Barriers to Discharge        Equipment Recommendations    none   Recommendations for Other Services    Frequency Min 3X/week   Plan Discharge plan remains appropriate    Precautions / Restrictions Precautions Precaution Comments: 2 lts O2    Pertinent Vitals/Pain No c/o pain    Mobility  Bed Mobility Bed Mobility: Not assessed Details for Bed Mobility Assistance: Pt sitting on EOB on arrival  Transfers Transfers: Sit to Stand;Stand to Sit Sit to Stand: 4: Min assist Stand to Sit: 4: Min guard Details for Transfer Assistance: one VC on hand placement and increased time  Ambulation/Gait Ambulation/Gait Assistance: 4: Min assist Ambulation Distance (Feet): 175 Feet Assistive device: Rolling walker Ambulation/Gait Assistance Details: +2 assist for equipment(IV, O2, chair, dynamat) Pt demon mild dyspnea with no c/o ABD pain. 25% VC's on proper walker to self distance and safety with turns. Gait Pattern: Step-through pattern;Trunk flexed    PT Goals                       progressing    Visit Information  Last PT Received On: 12/22/11 Assistance Needed: +2 (for equipment(IV, O2,chair))    Subjective Data   [place]   Cognition    good   Balance   good  End of Session PT - End of Session Equipment Utilized During Treatment: Gait belt;Oxygen (2lts 92% during gait) Activity Tolerance: Patient tolerated treatment well Patient left: in chair;with call bell/phone within reach   Vincent [name]  PTA [facility]  Acute  Rehab Pager      [phone]

## 2011-12-22 NOTE — Progress Notes (Signed)
CARE MANAGEMENT NOTE 12/22/2011  Patient:  Vincent [name], Vincent [name]   Account Number:  [id number]  Date Initiated:  12/17/2011  Documentation initiated by:  CRAFT,TERRI  Subjective/Objective Assessment:   78 yo male admitted 12/11/11 with rectal bleeding and abdominal pain     Action/Plan:   D/C when medically stable   Anticipated DC Date:  12/25/2011   Anticipated DC Plan:  HOME/SELF CARE         Choice offered to / List presented to:             Status of service:   Medicare Important Message given?   (If response is "NO", the following Medicare IM given date fields will be blank) Date Medicare IM given:   Date Additional Medicare IM given:    Discharge Disposition:    Per UR Regulation:  Reviewed for med. necessity/level of care/duration of stay  If discussed at Long Length of Stay Meetings, dates discussed:    Comments:  [id number]/Rhonda Earlene Plater, RN, BSN, CCM: CHART REVIEWED AND UPDATED. Patient has new pelvic abcess will have CT guided drain placement [id number] NO DISCHARGE NEEDS PRESENT AT THIS TIME. CASE MANAGEMENT [phone]   12/17/11, Kathi Der RNC-MNN, BSN, [phone], Pt with advanced rectal cancer.  Abdominal pain continues, however improving.  ? intrabdominal abscess.  Pt remains on IV Abx, IV Reglan, IV pain meds.  Plan is for pt to receive salvage chemotherapy as an outpatient.

## 2011-12-22 NOTE — Progress Notes (Signed)
ANTICOAGULATION CONSULT NOTE  Pharmacy Consult for warfarin Indication: hx afib  No Known Allergies  Patient Measurements: Height: 5\' 11"  (180.3 cm) Weight: 207 lb 3.2 oz (93.985 kg) IBW/kg (Calculated) : 75.3   Vital Signs: Temp: 97.6 F (36.4 C) (08/20 0540) Temp src: Oral (08/20 0540) BP: 100/46 mmHg (08/20 0540) Pulse Rate: 91  (08/20 0540)  Labs:  Basename 12/22/11 0513 12/21/11 0355 12/20/11 1239 12/20/11 0530  HGB -- 11.7* 10.7* --  HCT -- 36.3* 32.7* --  PLT -- 368 296 --  APTT -- -- -- --  LABPROT 22.8* 17.8* -- 17.6*  INR 1.97* 1.44 -- 1.42  HEPARINUNFRC -- -- -- --  CREATININE -- 0.84 0.81 --  CKTOTAL -- -- -- --  CKMB -- -- -- --  TROPONINI -- -- -- --   Estimated Creatinine Clearance: 87.6 ml/min (by C-G formula based on Cr of 0.84).   Medications:     . ampicillin-sulbactam (UNASYN) IV  1.5 g Intravenous Q6H  . antiseptic oral rinse  15 mL Mouth Rinse q12n4p  . atorvastatin  20 mg Oral q1800  . chlorhexidine  15 mL Mouth Rinse BID  . digoxin  125 mcg Oral Daily  . diltiazem  30 mg Oral Q8H  . enoxaparin (LOVENOX) injection  40 mg Subcutaneous Q24H  . ferrous sulfate  325 mg Oral Q breakfast  . Fluticasone-Salmeterol  1 puff Inhalation Q12H  . Ipratropium-Albuterol  1 puff Inhalation QID  . metFORMIN  500 mg Oral QHS  . metoCLOPramide (REGLAN) injection  10 mg Intravenous TID AC & HS  . omega-3 acid ethyl esters  1 g Oral Daily  . pantoprazole  40 mg Oral Q1200  . warfarin  3 mg Oral ONCE-1800  . Warfarin - Pharmacist Dosing Inpatient   Does not apply q1800  . DISCONTD: aspirin EC  81 mg Oral Daily   Assessment:  78 yo M admitted 8/9 with abdominal pain, history of rectal cancer with recurrence and possible intra-abdominal abscess seen on CT scan.  PTA on chronic warfarin 5 mg daily, except on Monday and Friday 2.5mg , dose taken 8/8 PTA.   Had reports of rectal bleeding on admission, no further reports of bleeding, S/P Flex Sig - source of  bleed was rectum. Noted at risk of rebleed if INR becomes supratx.  Per Dr Benjamine Mola, Coumadin per pharmacy to be resumed 8/16pm  Pt is s/p FFP and vitamin K 5mg  PO and 5mg  subcutaneously on 8/13   Pt is on Lovenox 40mg  sq q24h and ASA 81mg  daily  Potential drug interaction with Unasyn (may increase INR)  Using lower warfarin doses due to risk of bleed and due to INR > 6 after one dose of 2.5mg  on 8/10.  Regular diet now.  INR remains slighlty subtherapeutic but rising after 1mg , 1mg , 2mg , 3 mg.  Goal of Therapy:  INR 2-3   Plan:   Warfarin 2 mg today, cautiously.  Plan to d/c Lovenox 40mg  sq q24h when INR therapeutic  Daily PT/INR  Tammy Sours, Pharm.D. Clinical Oncology Pharmacist  Pager # [phone]  12/22/2011,8:29 AM.

## 2011-12-22 NOTE — Progress Notes (Signed)
[facility] PROGRESS NOTE  Vincent [name] BMW:[id number] DOB: [birth year] DOA: 12/11/2011 PCP: Aura Dials, MD  Brief narrative 78 year old male with previous history of rectal carcinoma (diagnosed in 2011) status post surgical resection and colostomy as well as radiation and chemotherapy who presented 12/11/2011 with complaints of abdominal and found to have a recurrent carcinoma with nodal metastases based on CT abdomen/pelvis. Patient is status post flexible sigmoidoscopy 12/18/2011 with findings consistent with rectal carcinoma.   Assessment/Plan:  *Abdominal pain in the setting of recurrent rectal carcinoma with nodal metastases  - per Dr. Clelia Croft of oncology plan for outpatient chemotherapy  - status post flex sig with findings of rectal carcinoma filling almost full rectal stomp  - pain well controlled on prn dilaudid and oxy IR   Active Problem:  Lower GI bleed, rectal bleed  - secondary to recurrent rectal carcinoma in the setting of supratherapeutic INR  - s/p fresh frozen and vitamin K on 8/13  - appreciate GI and surgery recs  -warfarin resumed with lovenox bridging. H&H stable   Acute blood loss anemia  - multifactorial, due to rectal bleed in the setting of supratherapeutic INR and rectal carcinoma  - hemoglobin stable at 11.7   Atrial fibrillation  - lovenox and coumadin per pharmacy  - hemoglobin and platelet count stable  - continue digoxin  - added Cardizem 30 mg Q 8 hours PO for better rate control  -follows with Dr berry [place]) as outpt  -d/c ASA given no clear indication and increase risk for further rectal bleed in association with coumadin.   Intraabdominal abscess  - per CT findings on 8/9; ~ 3.5 cm fluid collection in left pelvis concerning for small abscesseses  - on empiric ABX-unasyn ( since 8/9). initially planned  to treat for 10-14 day course.  -repeated CT on 8/19 as per surgery recs and shows increase in size of abscess. Patient has no further  fever or leucocytosis/ CT also shows pneumatosis over right colon and cannot rule out colonic ischemia. He has been having good colostomy output.  Planned for pelvix abscess drainage by IR today -follow up with results   Thrombocytopenia  - secondary to malignancy  - resolved   Hyponatremia  - 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 recurrent metastatic rectal carcinoma  - resolved   Weakness generalized  - secondary to metastatic rectal carcinoma  - PT evaluation pending. Refuses to go to SNF   Hypokalemia  - repleted    Code Status: full code  Family Communication: Family at bedside  Disposition Plan: PT eval pending. patient refuses to go to SNF and wants to home. Pending repeat CT of the abdomen today. Will be re evaluated by PT in the morning.   Consultants:  Heme/onc ( Dr Clelia Croft) GI ( Dr pyrtle) Surgery ( Gerkin) IR   Procedures:  Flexible sigmoidoscopy 12/18/2011  Antibiotics:  Unasyn 12/11/2011 -->  HPI/Subjective:  Feels better overall     Objective: Filed Vitals:   12/22/11 0822 12/22/11 0824 12/22/11 1206 12/22/11 1338  BP:    136/59  Pulse:    81  Temp:    97.9 F (36.6 C)  TempSrc:    Oral  Resp:    18  Height:      Weight:      SpO2: 99% 98% 97% 94%    Intake/Output Summary (Last 24 hours) at 12/22/11 1556 Last data filed at 12/22/11 1544  Gross per 24 hour  Intake   1010 ml  Output   1850 ml  Net   -840 ml   Filed Weights   12/11/11 2124 12/18/11 0620  Weight: 91.445 kg (201 lb 9.6 oz) 93.985 kg (207 lb 3.2 oz)    Exam:  General: Elderly male lying in bed in no acute distress  HEENT: No pallor moist oral mucosa  Cardiovascular: Normal S1 and S2 no murmurs rub or gallop  Respiratory: Clear bilaterally, no added sounds  Abdomen: Soft, colostomy Bag in Place , Foley removed Extremities warm no edema  CNS AAO x3 nonfocal   Data Reviewed: Basic Metabolic Panel:  Lab 12/21/11 1610 12/20/11 1239 12/19/11 0818 12/17/11 0423 12/16/11 0333  NA 136  135 139 136 138  K 3.6 3.3* 3.4* 3.5 3.6  CL 100 100 100 100 102  CO2 32 29 30 31 28   GLUCOSE 116* 120* 123* 131* 142*  BUN 20 23 34* 19 26*  CREATININE 0.84 0.81 1.09 0.92 0.93  CALCIUM 8.4 7.9* 8.4 8.6 8.6  MG -- -- -- -- --  PHOS -- -- -- -- --   Liver Function Tests: No results found for this basename: AST:5,ALT:5,ALKPHOS:5,BILITOT:5,PROT:5,ALBUMIN:5 in the last 168 hours No results found for this basename: LIPASE:5,AMYLASE:5 in the last 168 hours No results found for this basename: AMMONIA:5 in the last 168 hours CBC:  Lab 12/21/11 0355 12/20/11 1239 12/19/11 0528 12/17/11 0423 12/16/11 0333  WBC 9.9 8.1 10.3 8.3 7.7  NEUTROABS -- -- -- -- --  HGB 11.7* 10.7* 11.3* 10.7* 11.5*  HCT 36.3* 32.7* 35.3* 32.5* 35.0*  MCV 97.6 97.9 97.8 96.7 95.4  PLT 368 296 365 278 278   Cardiac Enzymes: No results found for this basename: CKTOTAL:5,CKMB:5,CKMBINDEX:5,TROPONINI:5 in the last 168 hours BNP (last 3 results) No results found for this basename: PROBNP:3 in the last 8760 hours CBG:  Lab 12/18/11 0852  GLUCAP 166*    No results found for this or any previous visit (from the past 240 hour(s)).   Studies: Dg Chest 1 View  12/20/2011  *RADIOLOGY REPORT*  Clinical Data: Chest pain, hypertension.  CHEST - 1 VIEW  Comparison: 12/11/2011  Findings: Left Port-A-Cath remains in place, unchanged.  Heart is normal size.  Mild peribronchial thickening and left base atelectasis.  No effusions.  No acute bony abnormality.  IMPRESSION: Bronchitic changes.  Left base atelectasis.  Original Report Authenticated By: Cyndie Chime, M.D.   Dg Chest 2 View  12/11/2011  *RADIOLOGY REPORT*  Clinical Data: Shortness of breath.  Generalized weakness. Abdominal pain.  History of colorectal cancer.  CHEST - 2 VIEW  Comparison: CT chest 04/09/2011, 10/20/2010, and portable chest x- ray 06/04/2010.  Findings: Cardiac silhouette normal in size, unchanged.  Thoracic aorta mildly atherosclerotic, unchanged.   Hilar and mediastinal contours otherwise unremarkable.  Lungs mildly hyperinflated but clear.  Bronchovascular markings normal.  No pleural effusions. Degenerative changes involving the thoracic spine.  Left subclavian Port-A-Cath tip in the mid SVC.  Degenerative changes involving the right shoulder.  IMPRESSION: Mild hyperinflation consistent with COPD and/or asthma.  No acute cardiopulmonary disease.  Stable examination.  Original Report Authenticated By: Arnell Sieving, M.D.   Ct Chest W Contrast  12/13/2011  *RADIOLOGY REPORT*  Clinical Data: Restaging for metastases.  Recurrent rectal cancer.  CT CHEST WITH CONTRAST  Technique:  Multidetector CT imaging of the chest was performed following the standard protocol during bolus administration of intravenous contrast.  Contrast: OMNIPAQUE IOHEXOL 300 MG/ML  SOLN  Comparison: CT chest dated 04/09/2011  Findings: Trace bilateral pleural effusions with minimal dependent atelectasis in the posterior right upper and bilateral lower lobes. No suspicious pulmonary nodules.  No pleural effusion or pneumothorax.  Visualized thyroid is unremarkable.  The heart is normal in size.  No pericardial effusion.  Mild atherosclerotic calcifications of the aortic arch.  Small mediastinal lymph nodes measuring up to 8 mm short axis, which do not meet pathologic CT size criteria.  No suspicious mediastinal or axillary lymphadenopathy.  Left chest port.  Visualized upper abdomen is notable for multiple tiny probable hepatic cysts, better visualized on recent CT abdomen pelvis.  Mild degenerative changes of the visualized thoracolumbar spine.  IMPRESSION: No evidence of metastatic disease in the chest.  Trace bilateral pleural effusions with associated minimal dependent atelectasis.  Original Report Authenticated By: Charline Bills, M.D.   Ct Abdomen Pelvis W Contrast  12/22/2011  **ADDENDUM** CREATED: 12/22/2011 09:33:41  The original report was by Dr. Gaylyn Rong.  The following addendum is by Dr. Gaylyn Rong:  The following report was issued on 12/21/2011 at about 5 PM, and I discussed it by phone with Dr. Gonzella Lex.  However, I am informed today that the report is not showing up in Epic EHR.  Fortunately I still had a copy of the report in the [facility] dictation system record, and the report which was issued yesterday follows:  *RADIOLOGY REPORT*  Clinical Data: Pelvic abscess.  Rectal adenocarcinoma.  CT ABDOMEN AND PELVIS WITH CONTRAST  Technique:  Multidetector CT imaging of the abdomen and pelvis was performed following the standard protocol during bolus administration of intravenous contrast.  Contrast: OMNIPAQUE IOHEXOL 300 MG/ML  SOLN .  Comparison: Multiple exams, including 12/11/2011  Findings: Moderate bilateral pleural effusions with passive atelectasis observed.  The small pericardial effusion is present.  Scattered small hypodense lesions in the liver are sharply defined and stable compared the prior exam.  The spleen appears unremarkable.  Gastric distention noted with scattered air-fluid levels in mildly dilated loops of small bowel which demonstrate progressive dilution of contrast extending towards the pelvis.  Possible partial small bowel obstruction due to effusion noted in the pelvis, with persistent rectal mass and fluid collection along the anterior margin of the mass eccentric to the left.  This fluid collection has increased in size, measuring 5.1 x 2.8 by 7.5 cm currently, and potentially representing an abscess.  Left colostomy noted, with diverticulosis of the colostomy segment which includes the descending and part of the sigmoid colon.  The appendix appears unremarkable.  There is a suggestion of mild pneumatosis in the cecum and ascending colon.  Mesenteric edema noted along with a small amount of fluid in the right pericolic gutter.  Thickened urinary bladder contains a Foley catheter.  There is a small amount of ascites  above the urinary bladder.  Mild gallbladder wall thickening may be due to distention or inflammation.  Left mid kidney cyst noted posteriorly.  Bilobed infrarenal abdominal aortic aneurysm noted with the upper portion measuring 3.5 cm in diameter and lower portion measuring 4.3 cm in diameter.  Subcutaneous edema noted.  Presacral edema is present.  IMPRESSION:    1.  Pelvic abscess adjacent to the rectal mass has enlarged.  2.  Suspected pneumatosis in the right colon.  This is of uncertain etiology although colonic ischemia is not confidently excluded. However, the celiac trunk and SMA appear patent, as does the inferior mesenteric artery.  3.  New moderate sized bilateral pleural effusions with small pericardial effusion.  4.  Subcutaneous edema and diffuse mesenteric edema with mild ascites.  5.  Distended stomach and mildly distended small bowel may be from ileus or low grade/partial small-bowel obstruction.  Part of the ileum may be matted against the abscess and tumor in the pelvis.  6.  Bilobed infrarenal abdominal aortic aneurysm measuring up to 4.3 cm in diameter.  7.  Left colostomy with diverticulosis of the colostomy segment.  I discussed these findings by telephone with Dr. Theda Belfast Miachel Nardelli at 5:22 p.m. on 12/21/2011.  **END ADDENDUM** SIGNED BY: Soyla Murphy. Ova Freshwater, M.D.   12/21/2011  *RADIOLOGY REPORT*  Clinical Data: Pelvic abscess.  Rectal adenocarcinoma.  CT ABDOMEN AND PELVIS WITH CONTRAST  Technique:  Multidetector CT imaging of the abdomen and pelvis was performed following the standard protocol during bolus administration of intravenous contrast.  Contrast: OMNIPAQUE IOHEXOL 300 MG/ML  SOLN  Comparison: Multiple exams, including 12/11/2011  Findings: Moderate bilateral pleural effusions with passive atelectasis observed.  The small pericardial effusion is present.  Scattered small hypodense lesions in the liver are sharply defined and stable compared the prior exam.  The spleen appears  unremarkable.  Gastric distention noted with scattered air-fluid levels in mildly dilated loops of small bowel which demonstrate progressive dilution of contrast extending towards the pelvis.  Possible partial small bowel obstruction due to effusion noted in the pelvis, with persistent rectal mass and fluid collection along the anterior margin of the mass eccentric to the left.  This fluid collection has increased in size, measuring 5.1 x 2.8 by 7.5 cm currently, and potentially representing an abscess.  Left colostomy noted, with diverticulosis of the colostomy segment which includes the descending and part of the sigmoid colon.  The appendix appears unremarkable.  There is a suggestion of mild pneumatosis in the cecum and ascending colon.  Mesenteric edema noted along with a small amount of fluid in the right pericolic gutter.  Thickened urinary bladder contains a Foley catheter.  There is a small amount of ascites above the urinary bladder.  Mild gallbladder wall thickening may be due to distention or inflammation.  Left mid kidney cyst noted posteriorly.  Bilobed infrarenal abdominal aortic aneurysm noted with the upper portion measuring 3.5 cm in diameter and lower portion measuring 4.3 cm in diameter.  Subcutaneous edema noted.  Presacral edema is present.  IMPRESSION:  1.  Pelvic abscess adjacent to the rectal mass has enlarged. 2.  Suspected pneumatosis in the right colon.  This is of uncertain etiology although colonic ischemia is not confidently excluded. However, the celiac trunk and SMA appear patent, as does the inferior mesenteric artery. 3.  New moderate sized bilateral pleural effusions with small pericardial effusion. 4.  Subcutaneous edema and diffuse mesenteric edema with mild ascites. 5.  Distended stomach and mildly distended small bowel may be from ileus or low grade/partial small-bowel obstruction.  Part of the ileum may be matted against the abscess and tumor in the pelvis. 6.  Bilobed  infrarenal abdominal aortic aneurysm measuring up to 4.3 cm in diameter. 7.  Left colostomy with diverticulosis of the colostomy segment.  I discussed these findings by telephone with Dr. Theda Belfast Jaicee Michelotti at 5:22 p.m. on 12/21/2011.  Original Report Authenticated By: Dellia Cloud, M.D.    Ct Abdomen Pelvis W Contrast  12/11/2011  *RADIOLOGY REPORT*  Clinical Data: Abdominal pain, hematuria, history of colon cancer status post resection in 2011  CT ABDOMEN AND PELVIS WITH CONTRAST  Technique:  Multidetector CT imaging of the abdomen  and pelvis was performed following the standard protocol during bolus administration of intravenous contrast.  Contrast: OMNIPAQUE IOHEXOL 300 MG/ML  SOLN  Comparison: 04/09/2011  Findings: Lung bases are clear.  Multiple probable hepatic cysts, unchanged.  Spleen, pancreas, and adrenal glands within normal limits.  Gallbladder is unremarkable.  No intrahepatic or extrahepatic ductal dilatation.  1.4 cm posterior interpolar left renal cyst (series 2/image 27). Right kidney is unremarkable.  No hydronephrosis.  No evidence of bowel obstruction.  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  Normal appendix.  Left lower quadrant colostomy.  Moderate left parastomal hernia containing fat and a loop of nondilated small bowel (series 2/image 39).  Again seen is rectal wall thickening, likely mildly progressed (series 2/image 70), suspicious for recurrent tumor.  Adjacent 3.0 x 2.0 cm right perirectal node (series 2/image 61), previously 8 mm, compatible with nodal metastasis.  3.3 x 2.4 cm fluid collection arising from the left rectal wall (series 2/image 66).  Adjacent/contiguous 3.5 x 2.0 cm fluid collection in the left pelvis (series 2/image 60), with associated thin enhancing rim, worrisome for possible abscess.  Bilobed infrarenal abdominal aortic aneurysm measuring 3.4 and 3.7 cm in AP diameter.   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 Indwelling Foley catheter.  Prostate is unremarkable.  Direct extension of tumor into the right seminal vesicle is difficult to exclude (series 4/image 22).  Degenerative changes of the visualized thoracolumbar spine.  IMPRESSION: Recurrent rectal tumor with associated 3.0 x 2.0 cm right perirectal nodal metastasis.  Additional left pelvic/perirectal fluid collections, as described above, worrisome for possible small abscess.  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  Left lower quadrant colostomy.  Parastomal hernia containing fat and a loop of nondilated small bowel.  No evidence of bowel obstruction.  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 Indwelling Foley catheter.  Original Report Authenticated By: Charline Bills, M.D.   Dg Abd 2 Views  12/20/2011  *RADIOLOGY REPORT*  Clinical Data: Abdominal pain.  ABDOMEN - 2 VIEW  Comparison: CT abdomen 12/11/2011  Findings: Marked gaseous distension of the stomach.  Moderate gaseous distension of small bowel.  Air-fluid levels on the decubitus view.  Changes are consistent with small bowel obstruction.  Some small bowel loops appear to demonstrate wall thickening.  This could be due to edema or inflammatory process. No free intra-abdominal air.  Degenerative changes in the spine.  IMPRESSION: Gaseous distension of stomach and small bowel suggesting small bowel obstruction.   Small bowel wall thickening suggested in the mid abdomen.  Original Report Authenticated By: Marlon Pel, M.D.    Scheduled Meds:   . ampicillin-sulbactam (UNASYN) IV  1.5 g Intravenous Q6H  . antiseptic oral rinse  15 mL Mouth Rinse q12n4p  . atorvastatin  20 mg Oral q1800  . chlorhexidine  15 mL Mouth Rinse BID  . digoxin  125 mcg Oral Daily  . diltiazem  30 mg Oral Q8H  . enoxaparin (LOVENOX) injection  40 mg Subcutaneous  Q24H  . ferrous sulfate  325 mg Oral Q breakfast  . Fluticasone-Salmeterol  1 puff Inhalation Q12H  . Ipratropium-Albuterol  1 puff Inhalation QID  . metFORMIN  500 mg Oral QHS  . metoCLOPramide (REGLAN) injection  10 mg Intravenous TID AC & HS  . omega-3 acid ethyl esters  1 g Oral Daily  . pantoprazole  40 mg Oral Q1200  .  warfarin  3 mg Oral ONCE-1800  . DISCONTD: warfarin  2 mg Oral ONCE-1800  . DISCONTD: Warfarin - Pharmacist Dosing Inpatient   Does not apply q1800   Continuous Infusions:      Time spent: 30 minutes    Gregoria Selvy  [facility] Pager [phone]. If 8PM-8AM, please contact night-coverage at [email or web address], password [email or web address] 12/22/2011, 3:56 PM  LOS: 11 days

## 2011-12-22 NOTE — Progress Notes (Signed)
Patient ID: Vincent [name], male   DOB: 1932-09-05, 78 y.o.   MRN: [id number]   Request has been made for pelvic abscess drain placement. Dr Deanne Coffer has reviewed films and feels window would be Transgluteal approach  Pt has started back on coumadin INR 1.97 today  Discussed with Dr Gerrit Friends He will arrange with Hospitalist to dc coumadin and wait for INR to come down  We need INR 1.5 to safely proceed. We will follow And when INR nl, will proceed.  Pt aware of procedure benefits and risks Pt has been consented.

## 2011-12-22 NOTE — Progress Notes (Signed)
Foley removed at 0315 this shift. Pt reports he feels urination "coming on" . States " Get me a cup of coffee with cream and sugar to get it going". Pulse ox continually alarming due to movement of hand while drinking coffee. Turned off until patient finishes coffee, c/s "Im going to through that dam thing out the window if it don't shut the hell up". Has been more alert and appropriate this shift.

## 2011-12-22 NOTE — Progress Notes (Signed)
Patient ID: Vincent [name], male   DOB: January 19, 1933, 78 y.o.   MRN: [id number]  General Surgery - [facility] - Progress Note  Subjective: Patient more alert, interactive.  No complaints of pain at present.  Tolerating regular diet.  CT scan last PM shows increased size of fluid collection in pelvis worrisome for abscess.  Objective: Vital signs in last 24 hours: Temp:  [97.6 F (36.4 C)-98.8 F (37.1 C)] 97.6 F (36.4 C) (08/20 0540) Pulse Rate:  [86-145] 91  (08/20 0540) Resp:  [16-20] 17  (08/20 0540) BP: (93-130)/(46-69) 100/46 mmHg (08/20 0540) SpO2:  [96 %-100 %] 98 % (08/20 0824) Last BM Date: 12/20/11  Intake/Output from previous day: 08/19 0701 - 08/20 0700 In: 700 [P.O.:600; IV Piggyback:100] Out: 1550 [Urine:1550]  Exam: HEENT - clear, not icteric Neck - soft Chest - few wheezes bilaterally Cor - rate controlled, no murmur Abd - soft, mild distension; active BS; no tenderness; soft stool in bag, small Ext - mild bilat edema Neuro - grossly intact, no focal deficits  Lab Results:   Basename 12/21/11 0355 12/20/11 1239  WBC 9.9 8.1  HGB 11.7* 10.7*  HCT 36.3* 32.7*  PLT 368 296     Basename 12/21/11 0355 12/20/11 1239  NA 136 135  K 3.6 3.3*  CL 100 100  CO2 32 29  GLUCOSE 116* 120*  BUN 20 23  CREATININE 0.84 0.81  CALCIUM 8.4 7.9*    Studies/Results: Ct Abdomen Pelvis W Contrast  12/21/2011  *RADIOLOGY REPORT*  Clinical Data: Pelvic abscess.  Rectal adenocarcinoma.  CT ABDOMEN AND PELVIS WITH CONTRAST  Technique:  Multidetector CT imaging of the abdomen and pelvis was performed following the standard protocol during bolus administration of intravenous contrast.  Contrast: OMNIPAQUE IOHEXOL 300 MG/ML  SOLN  Comparison: Multiple exams, including 12/11/2011  Findings: Moderate bilateral pleural effusions with passive atelectasis observed.  The small pericardial effusion is present.  Scattered small hypodense lesions in the liver  are sharply defined and stable compared the prior exam.  The spleen appears unremarkable.  Gastric distention noted with scattered air-fluid levels in mildly dilated loops of small bowel which demonstrate progressive dilution of contrast extending towards the pelvis.  Possible partial small bowel obstruction due to effusion noted in the pelvis, with persistent rectal mass and fluid collection along the anterior margin of the mass eccentric to the left.  This fluid collection has increased in size, measuring 5.1 x 2.8 by 7.5 cm currently, and potentially representing an abscess.  Left colostomy noted, with diverticulosis of the colostomy segment which includes the descending and part of the sigmoid colon.  The appendix appears unremarkable.  There is a suggestion of mild pneumatosis in the cecum and ascending colon.  Mesenteric edema noted along with a small amount of fluid in the right pericolic gutter.  Thickened urinary bladder contains a Foley catheter.  There is a small amount of ascites above the urinary bladder.  Mild gallbladder wall thickening may be due to distention or inflammation.  Left mid kidney cyst noted posteriorly.  Bilobed infrarenal abdominal aortic aneurysm noted with the upper portion measuring 3.5 cm in diameter and lower portion measuring 4.3 cm in diameter.  Subcutaneous edema noted.  Presacral edema is present.  IMPRESSION:  1.  Pelvic abscess adjacent to the rectal mass has enlarged. 2.  Suspected pneumatosis in the right colon.  This is of uncertain etiology although colonic ischemia is not confidently excluded. However, the celiac trunk and  SMA appear patent, as does the inferior mesenteric artery. 3.  New moderate sized bilateral pleural effusions with small pericardial effusion. 4.  Subcutaneous edema and diffuse mesenteric edema with mild ascites. 5.  Distended stomach and mildly distended small bowel may be from ileus or low grade/partial small-bowel obstruction.  Part of the ileum  may be matted against the abscess and tumor in the pelvis. 6.  Bilobed infrarenal abdominal aortic aneurysm measuring up to 4.3 cm in diameter. 7.  Left colostomy with diverticulosis of the colostomy segment.  I discussed these findings by telephone with Dr. Theda Belfast Dhungel at 5:22 p.m. on 12/21/2011.   Original Report Authenticated By: Dellia Cloud, M.D.     Assessment / Plan: 1.  Locally advanced rectal carcinoma, unresectable  - rectal bleeding occurred while over-anticoagulated  - large tumor noted on flex sig exam  - out-patient chemo Rx planned by Dr. Clelia Croft 2.  Abdominal pain / possible abscess on CT scan  - WBC and temp curve normal  - will request IR consult for possible percutaneous aspiration / drainage procedure  - ? Etiology of abscess in pelvis - no recent procedures  - continue IV Unasyn for now  Velora Heckler, MD, [facility] Office: [phone]  12/22/2011

## 2011-12-22 NOTE — H&P (Signed)
Vincent [name] is an 78 y.o. male.   Chief Complaint: pt with hx rectal ca; locally recurrent ca - unresectable - pelvic abscess seen on CT Scheduled now for abscess drain placement Dr Deanne Coffer has reviewed and approach would need to be transgluteal Pt has been back on coumadin x 2 days; INR 1.97 today Need INR 1.5 to safely proceed. Dr Gerrit Friends aware and agreeable; he will have Hospitalist hold coumadin and follow INR IR will proceed when INR 1.5. HPI: HTN; CAD; Rectal Ca; DM; afib  Past Medical History  Diagnosis Date  . Hypertension   . Arthritis   . Heart disease   . SOB (shortness of breath)   . Malignant neoplasm of hepatic flexure   . Malignant neoplasm of rectosigmoid junction   . Cancer     and/or colon polyp  . Malignant neoplasm of hepatic flexure   . Malignant neoplasm of rectosigmoid junction   . Diabetes mellitus   . Atrial fibrillation     Past Surgical History  Procedure Date  . Back surgery [phone]  . Arthroscopic knee 1992  . 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 Exploratory laparotomy 02/2010  . Rectosigmoid colon resection 02/2010  . Colostomy 02/2010  . Drainage of intra-abdominal abscess 02/2010  . Flexible sigmoidoscopy 12/18/2011    Procedure: FLEXIBLE SIGMOIDOSCOPY;  Surgeon: Beverley Fiedler, MD;  Location: [facility];  Service: Gastroenterology;  Laterality: N/A;    History reviewed. No pertinent family history. Social History:  reports that he has never smoked. He has never used smokeless tobacco. He reports that he does not drink alcohol or use illicit drugs.  Allergies: No Known Allergies  Medications Prior to Admission  Medication Sig Dispense Refill  . albuterol (PROAIR HFA) 108 (90 BASE) MCG/ACT inhaler Inhale 2 puffs into the lungs every 4 (four) hours as needed. WHEEZING      . albuterol-ipratropium (COMBIVENT) 18-103 MCG/ACT inhaler Inhale 1 puff into the lungs 4 (four) times daily. WHEEZING      . aspirin 81 MG tablet Take 81 mg by  mouth daily.        . digoxin (LANOXIN) 0.125 MG tablet Take 125 mcg by mouth daily.        . fish oil-omega-3 fatty acids 1000 MG capsule Take 1 g by mouth daily.        . Fluticasone-Salmeterol (ADVAIR DISKUS) 500-50 MCG/DOSE AEPB Inhale 1 puff into the lungs every 12 (twelve) hours.      . metFORMIN (GLUCOPHAGE) 500 MG tablet Take 500 mg by mouth at bedtime.      . simvastatin (ZOCOR) 40 MG tablet Take 40 mg by mouth at bedtime.        Marland Kitchen warfarin (COUMADIN) 5 MG tablet Take 2.5-5 mg by mouth daily. Pt takes 5 mg every day except on Monday and Friday pt takes 2.5mg  ( 1/2 tab of 5 mg tablet)        Results for orders placed during the hospital encounter of 12/11/11 (from the past 48 hour(s))  CBC     Status: Abnormal   Collection Time   12/20/11 12:39 PM      Component Value Range Comment   WBC 8.1  4.0 - 10.5 K/uL    RBC 3.34 (*) 4.22 - 5.81 MIL/uL    Hemoglobin 10.7 (*) 13.0 - 17.0 g/dL    HCT 14.7 (*) 82.9 - 52.0 %    MCV 97.9  78.0 - 100.0 fL    MCH 32.0  26.0 - 34.0 pg    MCHC 32.7  30.0 - 36.0 g/dL    RDW 98.1  19.1 - 47.8 %    Platelets 296  150 - 400 K/uL   BASIC METABOLIC PANEL     Status: Abnormal   Collection Time   12/20/11 12:39 PM      Component Value Range Comment   Sodium 135  135 - 145 mEq/L    Potassium 3.3 (*) 3.5 - 5.1 mEq/L    Chloride 100  96 - 112 mEq/L    CO2 29  19 - 32 mEq/L    Glucose, Bld 120 (*) 70 - 99 mg/dL    BUN 23  6 - 23 mg/dL    Creatinine, Ser 2.95  0.50 - 1.35 mg/dL    Calcium 7.9 (*) 8.4 - 10.5 mg/dL    GFR calc non Af Amer 84 (*) >90 mL/min    GFR calc Af Amer >90  >90 mL/min   PROTIME-INR     Status: Abnormal   Collection Time   12/21/11  3:55 AM      Component Value Range Comment   Prothrombin Time 17.8 (*) 11.6 - 15.2 seconds    INR 1.44  0.00 - 1.49   CBC     Status: Abnormal   Collection Time   12/21/11  3:55 AM      Component Value Range Comment   WBC 9.9  4.0 - 10.5 K/uL    RBC 3.72 (*) 4.22 - 5.81 MIL/uL    Hemoglobin 11.7  (*) 13.0 - 17.0 g/dL    HCT 62.1 (*) 30.8 - 52.0 %    MCV 97.6  78.0 - 100.0 fL    MCH 31.5  26.0 - 34.0 pg    MCHC 32.2  30.0 - 36.0 g/dL    RDW 65.7  84.6 - 96.2 %    Platelets 368  150 - 400 K/uL   BASIC METABOLIC PANEL     Status: Abnormal   Collection Time   12/21/11  3:55 AM      Component Value Range Comment   Sodium 136  135 - 145 mEq/L    Potassium 3.6  3.5 - 5.1 mEq/L    Chloride 100  96 - 112 mEq/L    CO2 32  19 - 32 mEq/L    Glucose, Bld 116 (*) 70 - 99 mg/dL    BUN 20  6 - 23 mg/dL    Creatinine, Ser 9.52  0.50 - 1.35 mg/dL    Calcium 8.4  8.4 - 84.1 mg/dL    GFR calc non Af Amer 83 (*) >90 mL/min    GFR calc Af Amer >90  >90 mL/min   PROTIME-INR     Status: Abnormal   Collection Time   12/22/11  5:13 AM      Component Value Range Comment   Prothrombin Time 22.8 (*) 11.6 - 15.2 seconds    INR 1.97 (*) 0.00 - 1.49    Ct Abdomen Pelvis W Contrast  12/22/2011  **ADDENDUM** CREATED: 12/22/2011 09:33:41  The original report was by Dr. Gaylyn Rong.  The following addendum is by Dr. Gaylyn Rong:  The following report was issued on 12/21/2011 at about 5 PM, and I discussed it by phone with Dr. Gonzella Lex.  However, I am informed today that the report is not showing up in Epic EHR.  Fortunately I still had a copy of the report in the [facility] dictation system record, and  the report which was issued yesterday follows:  *RADIOLOGY REPORT*  Clinical Data: Pelvic abscess.  Rectal adenocarcinoma.  CT ABDOMEN AND PELVIS WITH CONTRAST  Technique:  Multidetector CT imaging of the abdomen and pelvis was performed following the standard protocol during bolus administration of intravenous contrast.  Contrast: OMNIPAQUE IOHEXOL 300 MG/ML  SOLN .  Comparison: Multiple exams, including 12/11/2011  Findings: Moderate bilateral pleural effusions with passive atelectasis observed.  The small pericardial effusion is present.  Scattered small hypodense lesions in the liver are sharply defined  and stable compared the prior exam.  The spleen appears unremarkable.  Gastric distention noted with scattered air-fluid levels in mildly dilated loops of small bowel which demonstrate progressive dilution of contrast extending towards the pelvis.  Possible partial small bowel obstruction due to effusion noted in the pelvis, with persistent rectal mass and fluid collection along the anterior margin of the mass eccentric to the left.  This fluid collection has increased in size, measuring 5.1 x 2.8 by 7.5 cm currently, and potentially representing an abscess.  Left colostomy noted, with diverticulosis of the colostomy segment which includes the descending and part of the sigmoid colon.  The appendix appears unremarkable.  There is a suggestion of mild pneumatosis in the cecum and ascending colon.  Mesenteric edema noted along with a small amount of fluid in the right pericolic gutter.  Thickened urinary bladder contains a Foley catheter.  There is a small amount of ascites above the urinary bladder.  Mild gallbladder wall thickening may be due to distention or inflammation.  Left mid kidney cyst noted posteriorly.  Bilobed infrarenal abdominal aortic aneurysm noted with the upper portion measuring 3.5 cm in diameter and lower portion measuring 4.3 cm in diameter.  Subcutaneous edema noted.  Presacral edema is present.  IMPRESSION:    1.  Pelvic abscess adjacent to the rectal mass has enlarged.  2.  Suspected pneumatosis in the right colon.  This is of uncertain etiology although colonic ischemia is not confidently excluded. However, the celiac trunk and SMA appear patent, as does the inferior mesenteric artery.  3.  New moderate sized bilateral pleural effusions with small pericardial effusion.  4.  Subcutaneous edema and diffuse mesenteric edema with mild ascites.  5.  Distended stomach and mildly distended small bowel may be from ileus or low grade/partial small-bowel obstruction.  Part of the ileum may be matted  against the abscess and tumor in the pelvis.  6.  Bilobed infrarenal abdominal aortic aneurysm measuring up to 4.3 cm in diameter.  7.  Left colostomy with diverticulosis of the colostomy segment.  I discussed these findings by telephone with Dr. Theda Belfast Dhungel at 5:22 p.m. on 12/21/2011.  **END ADDENDUM** SIGNED BY: Soyla Murphy. Ova Freshwater, M.D.   12/21/2011  *RADIOLOGY REPORT*  Clinical Data: Pelvic abscess.  Rectal adenocarcinoma.  CT ABDOMEN AND PELVIS WITH CONTRAST  Technique:  Multidetector CT imaging of the abdomen and pelvis was performed following the standard protocol during bolus administration of intravenous contrast.  Contrast: OMNIPAQUE IOHEXOL 300 MG/ML  SOLN  Comparison: Multiple exams, including 12/11/2011  Findings: Moderate bilateral pleural effusions with passive atelectasis observed.  The small pericardial effusion is present.  Scattered small hypodense lesions in the liver are sharply defined and stable compared the prior exam.  The spleen appears unremarkable.  Gastric distention noted with scattered air-fluid levels in mildly dilated loops of small bowel which demonstrate progressive dilution of contrast extending towards the pelvis.  Possible partial small bowel  obstruction due to effusion noted in the pelvis, with persistent rectal mass and fluid collection along the anterior margin of the mass eccentric to the left.  This fluid collection has increased in size, measuring 5.1 x 2.8 by 7.5 cm currently, and potentially representing an abscess.  Left colostomy noted, with diverticulosis of the colostomy segment which includes the descending and part of the sigmoid colon.  The appendix appears unremarkable.  There is a suggestion of mild pneumatosis in the cecum and ascending colon.  Mesenteric edema noted along with a small amount of fluid in the right pericolic gutter.  Thickened urinary bladder contains a Foley catheter.  There is a small amount of ascites above the urinary bladder.   Mild gallbladder wall thickening may be due to distention or inflammation.  Left mid kidney cyst noted posteriorly.  Bilobed infrarenal abdominal aortic aneurysm noted with the upper portion measuring 3.5 cm in diameter and lower portion measuring 4.3 cm in diameter.  Subcutaneous edema noted.  Presacral edema is present.  IMPRESSION:  1.  Pelvic abscess adjacent to the rectal mass has enlarged. 2.  Suspected pneumatosis in the right colon.  This is of uncertain etiology although colonic ischemia is not confidently excluded. However, the celiac trunk and SMA appear patent, as does the inferior mesenteric artery. 3.  New moderate sized bilateral pleural effusions with small pericardial effusion. 4.  Subcutaneous edema and diffuse mesenteric edema with mild ascites. 5.  Distended stomach and mildly distended small bowel may be from ileus or low grade/partial small-bowel obstruction.  Part of the ileum may be matted against the abscess and tumor in the pelvis. 6.  Bilobed infrarenal abdominal aortic aneurysm measuring up to 4.3 cm in diameter. 7.  Left colostomy with diverticulosis of the colostomy segment.  I discussed these findings by telephone with Dr. Theda Belfast Dhungel at 5:22 p.m. on 12/21/2011.  Original Report Authenticated By: Dellia Cloud, M.D.     Review of Systems  Constitutional: Positive for weight loss. Negative for fever.  Respiratory: Negative for shortness of breath.   Cardiovascular: Negative for chest pain.  Gastrointestinal: Positive for abdominal pain. Negative for nausea and vomiting.  Neurological: Positive for weakness.    Blood pressure 100/46, pulse 91, temperature 97.6 F (36.4 C), temperature source Oral, resp. rate 17, height 5\' 11"  (1.803 m), weight 207 lb 3.2 oz (93.985 kg), SpO2 98.00%. Physical Exam  Constitutional: He is oriented to person, place, and time.  Cardiovascular: Normal rate, regular rhythm and normal heart sounds.   No murmur heard. Respiratory:  Effort normal and breath sounds normal. He has no wheezes.  GI: Soft. Bowel sounds are normal. There is tenderness.  Musculoskeletal: Normal range of motion.       weak  Neurological: He is alert and oriented to person, place, and time.  Psychiatric: He has a normal mood and affect. His behavior is normal. Judgment and thought content normal.     Assessment/Plan Hx rectal ca; local recurrence - unresectable Now with pelvic abscess Scheduled for pelvic abscess drain placement in IR when INR safe to do so (1.97 today) MD aware Pt aware of procedure benefits and risks and agreeable to proceed. Consent signed and in chart We will follow INR and move forward as soon as possible; pt aware  Jaime Grizzell A 12/22/2011, 10:19 AM

## 2011-12-23 ENCOUNTER — Inpatient Hospital Stay (HOSPITAL_COMMUNITY): Payer: Medicare Other

## 2011-12-23 LAB — PROTIME-INR
INR: 1.62 — ABNORMAL HIGH (ref 0.00–1.49)
Prothrombin Time: 19.5 seconds — ABNORMAL HIGH (ref 11.6–15.2)

## 2011-12-23 LAB — GLUCOSE, CAPILLARY: Glucose-Capillary: 124 mg/dL — ABNORMAL HIGH (ref 70–99)

## 2011-12-23 MED ORDER — INSULIN ASPART 100 UNIT/ML ~~LOC~~ SOLN: 0.0000 [IU] | Freq: Every day | SUBCUTANEOUS | Status: DC

## 2011-12-23 MED ORDER — INSULIN ASPART 100 UNIT/ML ~~LOC~~ SOLN
0.0000 [IU] | Freq: Three times a day (TID) | SUBCUTANEOUS | Status: DC
  Administered 2011-12-23 – 2011-12-25 (×4): 1 [IU] via SUBCUTANEOUS
  Administered 2011-12-25: 2 [IU] via SUBCUTANEOUS
  Administered 2011-12-25: 1 [IU] via SUBCUTANEOUS
  Administered 2011-12-26: 2 [IU] via SUBCUTANEOUS
  Administered 2011-12-26 – 2011-12-28 (×5): 1 [IU] via SUBCUTANEOUS
  Administered 2011-12-28: 2 [IU] via SUBCUTANEOUS

## 2011-12-23 MED ORDER — MIDAZOLAM HCL 2 MG/2ML IJ SOLN
INTRAMUSCULAR | Status: AC
  Filled 2011-12-23: qty 6

## 2011-12-23 MED ORDER — MIDAZOLAM HCL 5 MG/5ML IJ SOLN
INTRAMUSCULAR | Status: AC | PRN
  Administered 2011-12-23 (×2): 1 mg via INTRAVENOUS

## 2011-12-23 MED ORDER — FENTANYL CITRATE 0.05 MG/ML IJ SOLN
INTRAMUSCULAR | Status: AC
  Filled 2011-12-23: qty 6

## 2011-12-23 MED ORDER — FENTANYL CITRATE 0.05 MG/ML IJ SOLN
INTRAMUSCULAR | Status: AC | PRN
  Administered 2011-12-23 (×4): 50 ug via INTRAVENOUS

## 2011-12-23 MED ORDER — HYDROMORPHONE HCL PF 2 MG/ML IJ SOLN
2.0000 mg | INTRAMUSCULAR | Status: DC | PRN
  Administered 2011-12-23 – 2011-12-28 (×20): 2 mg via INTRAVENOUS
  Filled 2011-12-23 (×20): qty 1

## 2011-12-23 NOTE — Procedures (Signed)
Successful CT pelvic abscess drain via left transgluteal approach No comp Stable 70cc exudative gelatinous fluid aspirated Gs/cx sent

## 2011-12-23 NOTE — Progress Notes (Addendum)
[facility] PROGRESS NOTE  DONNA SILVERMAN WUJ:[id number] DOB: 1932/12/09 DOA: 12/11/2011 PCP: Aura Dials, MD  Brief narrative: 78 year old male with previous history of rectal carcinoma (diagnosed in 2011) status post surgical resection and colostomy as well as radiation and chemotherapy who presented 12/11/2011 with complaints of abdominal pain and found to have a recurrent carcinoma with nodal metastases based on CT abdomen/pelvis. Patient is status post flexible sigmoidoscopy 12/18/2011 with findings consistent with rectal carcinoma. Hospital course further complicated by an enlarging pelvic abscess which required drainage done today 12/23/2011, status post 70 cc gelatinous fluid removed.  Assessment/Plan:   Principal Problem: *Abdominal pain in the setting of recurrent rectal carcinoma with nodal metastases  - per Dr. Clelia Croft of oncology plan for outpatient chemotherapy  - status post flexible sigmoidoscopy with findings of rectal carcinoma filling almost full rectal stomp  - continue current pain regimen  Active Problem:  Lower GI bleed, rectal bleed  - this was determined to be secondary to recurrent rectal carcinoma in the setting of supratherapeutic INR  - s/p fresh frozen and vitamin K on 8/13  - appreciate GI and surgery recommendations - hemoglobin stable at 11.7 - follow up CBC in am  Acute blood loss anemia  - multifactorial, due to rectal bleed in the setting of supratherapeutic INR and rectal carcinoma  - hemoglobin stable at 11.7   Atrial fibrillation  - lovenox and coumadin per pharmacy - however on hold for CT pelvic abscess drainage today - hemoglobin and platelet count stable  - will continue digoxin  - added Cardizem 30 mg Q 8 hours PO for better rate control   Intraabdominal abscess  - per CT findings on 8/9; ~ 3.5 cm fluid collection in left pelvis enlarging - status post drainage of abscess today with 70 cc gelatinous fluids removed - on empiric  ABX-unasyn ( since 8/9). initially planned to treat for 10-14 day course.   Thrombocytopenia  - secondary to malignancy  - resolved   Hyponatremia  - 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 recurrent metastatic rectal carcinoma  - resolved  - follow up BMP in am  Weakness generalized  - secondary to metastatic rectal carcinoma   Hypokalemia  - repleted  - follow up BMP in am  Code Status: full code  Family Communication: no family available at  bedside  Disposition Plan: likely home when stable  Consultants:  Heme/onc ( Dr Clelia Croft)  GI ( Dr pyrtle)  Surgery ( Gerkin)  IR  Procedures:  Flexible sigmoidoscopy 12/18/2011  CT pelvic abscess drainage 12/23/2011  Antibiotics:  Unasyn 12/11/2011 -->  Manson Passey, MD  [facility] Pager [phone]  If 7PM-7AM, please contact night-coverage [email or web address] Password [email or web address] 12/23/2011, 1:31 PM   LOS: 12 days   HPI/Subjective: No acute overnight events.  Objective: Filed Vitals:   12/23/11 1020 12/23/11 1211 12/23/11 1249 12/23/11 1318  BP: 110/53  118/60 129/57  Pulse: 78  87 100  Temp: 97.8 F (36.6 C)  98.9 F (37.2 C)   TempSrc: Oral  Oral   Resp: 16  18   Height:      Weight:      SpO2: 93% 92% 94%     Intake/Output Summary (Last 24 hours) at 12/23/11 1331 Last data filed at 12/23/11 1200  Gross per 24 hour  Intake    360 ml  Output   1055 ml  Net   -695 ml    Exam:   General:  Pt is alert, follows  commands appropriately, not in acute distress  Cardiovascular: irregular rhythm, rate controlled, S1/S2, no murmurs, no rubs, no gallops  Respiratory: Clear to auscultation bilaterally, no wheezing, no crackles, no rhonchi  Abdomen: Soft, non tender, non distended, colostomy bag (+)  Extremities: LE edema, pulses DP and PT palpable bilaterally  Neuro: Grossly nonfocal  Data Reviewed: Basic Metabolic Panel:  Lab 12/21/11 8295 12/20/11 1239 12/19/11 0818 12/17/11 0423  NA 136 135 139 136  K 3.6 3.3*  3.4* 3.5  CL 100 100 100 100  CO2 32 29 30 31   GLUCOSE 116* 120* 123* 131*  BUN 20 23 34* 19  CREATININE 0.84 0.81 1.09 0.92  CALCIUM 8.4 7.9* 8.4 8.6   CBC:  Lab 12/21/11 0355 12/20/11 1239 12/19/11 0528 12/17/11 0423  WBC 9.9 8.1 10.3 8.3  NEUTROABS -- -- -- --  HGB 11.7* 10.7* 11.3* 10.7*  HCT 36.3* 32.7* 35.3* 32.5*  MCV 97.6 97.9 97.8 96.7  PLT 368 296 365 278   CBG:  Lab 12/23/11 1213 12/18/11 0852  GLUCAP 124* 166*    Studies: Ct Abdomen Pelvis W Contrast 12/22/2011  **ADDENDUM**   IMPRESSION:    1.  Pelvic abscess adjacent to the rectal mass has enlarged.  2.  Suspected pneumatosis in the right colon.  This is of uncertain etiology although colonic ischemia is not confidently excluded. However, the celiac trunk and SMA appear patent, as does the inferior mesenteric artery.  3.  New moderate sized bilateral pleural effusions with small pericardial effusion.  4.  Subcutaneous edema and diffuse mesenteric edema with mild ascites.  5.  Distended stomach and mildly distended small bowel may be from ileus or low grade/partial small-bowel obstruction.  Part of the ileum may be matted against the abscess and tumor in the pelvis.  6.  Bilobed infrarenal abdominal aortic aneurysm measuring up to 4.3 cm in diameter.  7.  Left colostomy with diverticulosis of the colostomy segment.  I discussed these findings by telephone with Dr. Theda Belfast Dhungel at 5:22 p.m. on 12/21/2011.    12/21/2011  *  IMPRESSION:  1.  Pelvic abscess adjacent to the rectal mass has enlarged. 2.  Suspected pneumatosis in the right colon.  This is of uncertain etiology although colonic ischemia is not confidently excluded. However, the celiac trunk and SMA appear patent, as does the inferior mesenteric artery. 3.  New moderate sized bilateral pleural effusions with small pericardial effusion. 4.  Subcutaneous edema and diffuse mesenteric edema with mild ascites. 5.  Distended stomach and mildly distended small bowel may be  from ileus or low grade/partial small-bowel obstruction.  Part of the ileum may be matted against the abscess and tumor in the pelvis. 6.  Bilobed infrarenal abdominal aortic aneurysm measuring up to 4.3 cm in diameter. 7.  Left colostomy with diverticulosis of the colostomy segment.     Scheduled Meds:   . ampicillin-sulbactam (UNASYN) IV  1.5 g Intravenous Q6H  . atorvastatin  20 mg Oral q1800  . digoxin  125 mcg Oral Daily  . diltiazem  30 mg Oral Q8H  . enoxaparin (LOVENOX)  40 mg Subcutaneous Q24H  . fentaNYL      . ferrous sulfate  325 mg Oral Q breakfast  . Fluticasone-Salmeterol  1 puff Inhalation Q12H  . insulin aspart  0-5 Units Subcutaneous QHS  . insulin aspart  0-9 Units Subcutaneous TID WC  . Ipratropium-Albuterol  1 puff Inhalation QID  . metoCLOPramide  10 mg Intravenous TID AC & HS  .  pantoprazole  40 mg Oral Q1200

## 2011-12-23 NOTE — Progress Notes (Signed)
PT Cancellation Note  Treatment cancelled today due to patient's refusal to participate Pt had transgluteal drain placed today and is too painful to walk this afternoon.  Donnetta Hail 12/23/2011, 4:33 PM

## 2011-12-24 LAB — BASIC METABOLIC PANEL
BUN: 12 mg/dL (ref 6–23)
Chloride: 99 mEq/L (ref 96–112)
Creatinine, Ser: 0.9 mg/dL (ref 0.50–1.35)
Glucose, Bld: 149 mg/dL — ABNORMAL HIGH (ref 70–99)
Potassium: 3.9 mEq/L (ref 3.5–5.1)

## 2011-12-24 LAB — CBC
HCT: 29.3 % — ABNORMAL LOW (ref 39.0–52.0)
Hemoglobin: 9.5 g/dL — ABNORMAL LOW (ref 13.0–17.0)
MCV: 97.7 fL (ref 78.0–100.0)
RDW: 15 % (ref 11.5–15.5)
WBC: 6.7 10*3/uL (ref 4.0–10.5)

## 2011-12-24 LAB — GLUCOSE, CAPILLARY
Glucose-Capillary: 143 mg/dL — ABNORMAL HIGH (ref 70–99)
Glucose-Capillary: 111 mg/dL — ABNORMAL HIGH (ref 70–99)
Glucose-Capillary: 136 mg/dL — ABNORMAL HIGH (ref 70–99)

## 2011-12-24 LAB — PROTIME-INR: INR: 1.4 (ref 0.00–1.49)

## 2011-12-24 MED ORDER — WARFARIN - PHARMACIST DOSING INPATIENT
Freq: Every day | Status: DC
  Administered 2011-12-25: 18:00:00

## 2011-12-24 MED ORDER — WARFARIN SODIUM 3 MG PO TABS
3.0000 mg | ORAL_TABLET | Freq: Once | ORAL | Status: AC
  Administered 2011-12-24: 3 mg via ORAL
  Filled 2011-12-24: qty 1

## 2011-12-24 NOTE — Progress Notes (Signed)
Spoke with patient regarding cpap qhs.  Pt stated he has been using his oxygen in his nose since he's been here and done just fine.  Pt does not want to wear it tonight.  Pt was advised that RT available all night should he change his mind.  Pt remains on 2lnc with bedside pulseox with sats 95%

## 2011-12-24 NOTE — Progress Notes (Signed)
ANTICOAGULATION CONSULT NOTE  Pharmacy Consult for warfarin Indication: hx afib  No Known Allergies  Patient Measurements: Height: 5\' 11"  (180.3 cm) Weight: 207 lb 3.2 oz (93.985 kg) IBW/kg (Calculated) : 75.3   Vital Signs: Temp: 97.9 F (36.6 C) (08/22 1032) Temp src: Oral (08/22 1032) BP: 121/66 mmHg (08/22 1032) Pulse Rate: 82  (08/22 1032)  Labs:  Basename 12/24/11 0615 12/23/11 0545 12/22/11 0513  HGB 9.5* -- --  HCT 29.3* -- --  PLT 233 -- --  APTT -- -- --  LABPROT 17.4* 19.5* 22.8*  INR 1.40 1.62* 1.97*  HEPARINUNFRC -- -- --  CREATININE 0.90 -- --  CKTOTAL -- -- --  CKMB -- -- --  TROPONINI -- -- --   Estimated Creatinine Clearance: 81.8 ml/min (by C-G formula based on Cr of 0.9).   Medications:     . ampicillin-sulbactam (UNASYN) IV  1.5 g Intravenous Q6H  . antiseptic oral rinse  15 mL Mouth Rinse q12n4p  . atorvastatin  20 mg Oral q1800  . chlorhexidine  15 mL Mouth Rinse BID  . digoxin  125 mcg Oral Daily  . diltiazem  30 mg Oral Q8H  . enoxaparin (LOVENOX) injection  40 mg Subcutaneous Q24H  . fentaNYL      . ferrous sulfate  325 mg Oral Q breakfast  . Fluticasone-Salmeterol  1 puff Inhalation Q12H  . insulin aspart  0-5 Units Subcutaneous QHS  . insulin aspart  0-9 Units Subcutaneous TID WC  . Ipratropium-Albuterol  1 puff Inhalation QID  . metoCLOPramide (REGLAN) injection  10 mg Intravenous TID AC & HS  . midazolam      . omega-3 acid ethyl esters  1 g Oral Daily  . pantoprazole  40 mg Oral Q1200  . warfarin  3 mg Oral ONCE-1800  . Warfarin - Pharmacist Dosing Inpatient   Does not apply q1800   Assessment:  78 yo M admitted 8/9 with abdominal pain, history of rectal cancer with recurrence and intra-abdominal abscess   PTA on chronic warfarin 5 mg daily, except on Monday and Friday 2.5mg , dose taken 8/8 PTA.   Had reports of rectal bleeding on admission, no further reports of bleeding, S/P Flex Sig - source of bleed was rectum. Noted  at risk of rebleed if INR becomes supratx.  FFP & Vit K 8/13 with Coumadin per pharmacy resumed 8/16pm  Current Lovenox 40mg  sq q24h and ASA 81mg  daily  Potential drug interaction with Unasyn (may increase INR)  Using lower warfarin doses due to risk of bleed and due to INR > 6 after one dose of 2.5mg  on 8/10.  INR 1.4 today, Coumadin held 8/20 for drain placement 8/21, resume Coumadin today.  Goal of Therapy:  INR 2-3   Plan:   Warfarin 3mg  today   Continue Lovenox 40mg  sq q24h, d/c when INR therapeutic  Daily PT/INR  Chilton Si, Nickie Warwick L  Pharm.D. Pager # [phone]  12/24/2011,10:45 AM.

## 2011-12-24 NOTE — Progress Notes (Signed)
Physical Therapy Treatment Patient Details Name: Vincent [name] MRN: [id number] DOB: 20-Dec-1932 Today's Date: 12/24/2011 Time: 1050-1110 PT Time Calculation (min): 20 min  PT Assessment / Plan / Recommendation Comments on Treatment Session  Pt appears to be doing better.  Mobility and gait improving and pt should be able to return home with family and HHPT support. He did not need O2 for ambulation and had no dyspnea, O2 sats 92%.  Gluteal drain intact    Follow Up Recommendations  Home health PT    Barriers to Discharge        Equipment Recommendations  None recommended by PT    Recommendations for Other Services    Frequency Min 3X/week   Plan Discharge plan remains appropriate    Precautions / Restrictions     Pertinent Vitals/Pain Pt c/o pain at left  gluteal drain site    Mobility  Bed Mobility Bed Mobility: Supine to Sit Supine to Sit: 6: Modified independent (Device/Increase time);With rails Details for Bed Mobility Assistance: pt states"let me do it" Transfers Transfers: Sit to Stand;Stand to Sit Sit to Stand: 6: Modified independent (Device/Increase time) Ambulation/Gait Ambulation/Gait Assistance: 4: Min assist Ambulation Distance (Feet): 200 Feet Assistive device: Rolling walker Ambulation/Gait Assistance Details: pt tends to push RW our in front of him depsite cues to stand erect and keep hips under him. Gait Pattern: Step-through pattern;Trunk flexed Gait velocity: decreased General Gait Details: Improved speed and distance. Pt appears conformtable with use of RW Stairs: No Wheelchair Mobility Wheelchair Mobility: No    Exercises General Exercises - Lower Extremity Ankle Circles/Pumps: AROM;Both;5 reps;Supine Quad Sets: AROM;Both;5 reps;Supine Heel Slides: AROM;Right;10 reps;Supine Hip ABduction/ADduction: AROM;Right;Supine;10 reps   PT Diagnosis:    PT Problem List:   PT Treatment Interventions:     PT Goals Acute Rehab PT Goals PT Goal  Formulation: With patient Time For Goal Achievement: 12/28/11 Potential to Achieve Goals: Good Pt will go Supine/Side to Sit: Independently PT Goal: Supine/Side to Sit - Progress: Progressing toward goal Pt will go Sit to Stand: Independently PT Goal: Sit to Stand - Progress: Progressing toward goal Pt will Ambulate: >150 feet;with modified independence;with least restrictive assistive device PT Goal: Ambulate - Progress: Progressing toward goal Pt will Perform Home Exercise Program: Independently PT Goal: Perform Home Exercise Program - Progress: Progressing toward goal  Visit Information  Last PT Received On: 12/24/11 Assistance Needed: +1    Subjective Data  Subjective: "Show me the way to go home" Patient Stated Goal: to go home as soon as possible   Cognition  Overall Cognitive Status: Appears within functional limits for tasks assessed/performed Arousal/Alertness: Awake/alert Orientation Level: Appears intact for tasks assessed Behavior During Session: [facility] for tasks performed Cognition - Other Comments: pt in good spirits, agreeable to walk     Balance  Balance Balance Assessed: Yes Static Standing Balance Static Standing - Level of Assistance: 7: Independent Static Standing - Comment/# of Minutes: pt was able to stand erect with hips and knees extended.  He was able to to move his arms and maintain balance. Stood for about 2 minutes  End of Session PT - End of Session Equipment Utilized During Treatment:  (2lts 92% during gait) Activity Tolerance: Patient tolerated treatment well Patient left: with call bell/phone within reach;in bed   GP     Rosey Bath K. Spencer, [place] [phone] 12/24/2011, 11:20 AM

## 2011-12-24 NOTE — Progress Notes (Signed)
Patient ID: Vincent [name], male   DOB: 16-Nov-1932, 78 y.o.   MRN: [id number]  General Surgery - [facility] - Progress Note  Subjective: Patient comfortable.  Abdominal pain improved with drainage of collection.  Objective: Vital signs in last 24 hours: Temp:  [97.8 F (36.6 C)-98.1 F (36.7 C)] 97.9 F (36.6 C) (08/22 1343) Pulse Rate:  [71-87] 87  (08/22 1343) Resp:  [16-18] 18  (08/22 1343) BP: (121-155)/(57-83) 132/67 mmHg (08/22 1343) SpO2:  [92 %-99 %] 97 % (08/22 1541) Last BM Date: 12/23/11  Intake/Output from previous day: 08/21 0701 - 08/22 0700 In: 5  Out: 1525 [Urine:1450; Drains:75]  Exam: HEENT - clear, not icteric Neck - soft Abd - mild distension; BS present; minimal tenderness; drain with thin serosanguinous with gelatinous material - no evidence infection  Lab Results:   [facility] 12/24/11 0615  WBC 6.7  HGB 9.5*  HCT 29.3*  PLT 233     Basename 12/24/11 0615  NA 135  K 3.9  CL 99  CO2 30  GLUCOSE 149*  BUN 12  CREATININE 0.90  CALCIUM 8.0*    Studies/Results: Ct Guided Abscess Drain  12/23/2011  *RADIOLOGY REPORT*  Clinical Data: Postop enlarging pelvic abscess  CT GUIDED TRANSGLUTEAL PELVIC ABSCESS DRAIN INSERTION  Date:  12/23/2011 16:35:00  Radiologist:  Judie Petit. Ruel Favors, M.D.  Medications:  2 mg Versed, 200 mcg Fentanyl  Guidance:  CT  Fluoroscopy time:  None.  Sedation time:  15 minutes  Contrast volume:  None.  Complications:  No immediate  PROCEDURE/FINDINGS:  Informed consent was obtained from the patient following explanation of the procedure, risks, benefits and alternatives. The patient understands, agrees and consents for the procedure. All questions were addressed.  A time out was performed.  Maximal barrier sterile technique utilized including caps, mask, sterile gowns, sterile gloves, large sterile drape, hand hygiene, and betadine  Previous imaging reviewed.  The patient was positioned prone. Noncontrast  localization CT performed.  Pelvic fluid collection in the perirectal region localized.  Under sterile conditions and local anesthesia, an 18 gauge Hawkins needle was advanced from a left transgluteal oblique approach into the fluid collection with CT guidance.  Needle position confirmed with CT.  Syringe aspiration yielded exudative fluid.  Gram stain cultures sent. Guide wire advanced.  Guide wire position confirmed with CT imaging.  Tract dilatation performed to advance a 10-French drain catheter with the retention loop formed in the cavity.  Position confirmed with CT.  Syringe aspiration yielded 70 ml exudative fluid.  Post procedure imaging demonstrates decompression of the fluid collection.  Catheter secured with a Prolene suture and connected to external suction bulb.  Sterile dressing applied.  No immediate complication.  The patient tolerated the procedure well.  IMPRESSION: Successful CT guided left transgluteal pelvic abscess drain insertion   Original Report Authenticated By: Judie Petit. Ruel Favors, M.D.     Assessment / Plan: 1.  Abdominal fluid collection  - ? Etiology - does not appear to have been an abscess - ? Related to tumor  - drain management per IR  Velora Heckler, MD, [facility] Office: [phone]  12/24/2011

## 2011-12-24 NOTE — Progress Notes (Addendum)
[facility] PROGRESS NOTE  Vincent [name] ZOX:[id number] DOB: 08-08-1932 DOA: 12/11/2011 PCP: Aura Dials, MD  Brief narrative:  78 year old male with previous history of rectal carcinoma (diagnosed in 2011) status post surgical resection and colostomy as well as radiation and chemotherapy who presented 12/11/2011 with complaints of abdominal pain and found to have a recurrent carcinoma with nodal metastases based on CT abdomen/pelvis. Patient is status post flexible sigmoidoscopy 12/18/2011 with findings consistent with rectal carcinoma. Hospital course further complicated by an enlarging pelvic abscess which required drainage done 12/23/2011, status post 70 cc gelatinous fluid removed.   Assessment/Plan:   Principal Problem:  *Abdominal pain in the setting of recurrent rectal carcinoma with nodal metastases  - per Dr. Clelia Croft of oncology plan for outpatient chemotherapy  - status post flexible sigmoidoscopy with findings of rectal carcinoma filling almost full rectal stomp  - continue current pain regimen; was increased to 2 mg dilaudid Q3 hours PRN IV  Active Problem:  Lower GI bleed, rectal bleed  - secondary to recurrent rectal carcinoma in the setting of supratherapeutic INR  - s/p fresh frozen and vitamin K on 8/13  - appreciate GI and surgery recommendations  - hemoglobin dropped from 11.7 to 9.5 in past 24 hours, perhaps status post abscess drain - follow up CBC in am   Acute blood loss anemia  - multifactorial, due to rectal bleed in the setting of supratherapeutic INR and rectal carcinoma  - hemoglobin 9.5 - continue to follow CBC  Atrial fibrillation  - continue lovenox and coumadin per pharmacy - will continue Digoxin and addedCardizem 30 mg Q 8 hours PO for better rate control   Intraabdominal abscess  - per CT findings on 8/9; ~ 3.5 cm fluid collection in left pelvis enlarging  - status post drainage of abscess 8/21 with 70 cc gelatinous fluids removed  - on  empiric ABX-unasyn ( since 8/9). initially planned to treat for 10-14 day course.   Thrombocytopenia  - secondary to malignancy  - resolved   Hyponatremia  - 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 recurrent metastatic rectal carcinoma  - resolved   Weakness generalized  - secondary to metastatic rectal carcinoma   Hypokalemia  - repleted  - potassium WNL today  Code Status: full code  Family Communication: no family available at bedside  Disposition Plan: likely home when stable   Consultants:  Heme/onc ( Dr Clelia Croft)  GI ( Dr pyrtle)  Surgery ( Ger[facility])  IR  Procedures:  Flexible sigmoidoscopy 12/18/2011  CT pelvic abscess drainage 12/23/2011   Antibiotics:  Unasyn 12/11/2011 -->   Manson Passey, MD  [facility] Pager [phone]  If 7PM-7AM, please contact night-coverage [email or web address] Password [email or web address] 12/24/2011, 1:35 PM   LOS: 13 days   HPI/Subjective: Some discomfort at the draining site.  Objective: Filed Vitals:   12/24/11 0729 12/24/11 1032 12/24/11 1110 12/24/11 1207  BP:  121/66    Pulse:  82    Temp:  97.9 F (36.6 C)    TempSrc:  Oral    Resp:  18    Height:      Weight:      SpO2: 99% 95% 92% 97%    Intake/Output Summary (Last 24 hours) at 12/24/11 1335 Last data filed at 12/24/11 1011  Gross per 24 hour  Intake    130 ml  Output   1640 ml  Net  -1510 ml    Exam:   General:  Pt is alert, follows commands appropriately,  not in acute distress  Cardiovascular: irregular rhythm, S1/S2, no murmurs, no rubs, no gallops  Respiratory: Clear to auscultation bilaterally, no wheezing, no crackles, no rhonchi  Abdomen: Soft, non tender, non distended, bowel sounds present, no guarding  Extremities: LE edema, pulses DP and PT palpable bilaterally  Neuro: Grossly nonfocal  Data Reviewed: Basic Metabolic Panel:  Lab 12/24/11 9604 12/21/11 0355 12/20/11 1239 12/19/11 0818  NA 135 136 135 139  K 3.9 3.6 3.3* 3.4*  CL 99 100 100 100  CO2 30 32  29 30  GLUCOSE 149* 116* 120* 123*  BUN 12 20 23  34*  CREATININE 0.90 0.84 0.81 1.09  CALCIUM 8.0* 8.4 7.9* 8.4  MG -- -- -- --  PHOS -- -- -- --   CBC:  Lab 12/24/11 0615 12/21/11 0355 12/20/11 1239 12/19/11 0528  WBC 6.7 9.9 8.1 10.3  NEUTROABS -- -- -- --  HGB 9.5* 11.7* 10.7* 11.3*  HCT 29.3* 36.3* 32.7* 35.3*  MCV 97.7 97.6 97.9 97.8  PLT 233 368 296 365   CBG:  Lab 12/24/11 0806 12/24/11 0641 12/23/11 2142 12/23/11 1631 12/23/11 1213  GLUCAP 143* 142* 152* 94 124*    Recent Results (from the past 240 hour(s))  CULTURE, ROUTINE-ABSCESS     Status: Normal (Preliminary result)   Collection Time   12/23/11 10:04 AM      Component Value Range Status Comment   Specimen Description ABSCESS PETIONEAL CAVITY   Final    Special Requests NONE   Final    Gram Stain     Final    Value: ABUNDANT WBC PRESENT, PREDOMINANTLY PMN     NO SQUAMOUS EPITHELIAL CELLS SEEN     NO ORGANISMS SEEN   Culture NO GROWTH 1 DAY   Final    Report Status PENDING   Incomplete   ANAEROBIC CULTURE     Status: Normal (Preliminary result)   Collection Time   12/23/11 10:04 AM      Component Value Range Status Comment   Specimen Description ABSCESS PERITONEAL CAVITY   Final    Special Requests NONE   Final    Gram Stain PENDING   Incomplete    Culture     Final    Value: NO ANAEROBES ISOLATED; CULTURE IN PROGRESS FOR 5 DAYS   Report Status PENDING   Incomplete      Studies: Ct Guided Abscess Drain  12/23/2011  *RADIOLOGY REPORT*  Clinical Data: Postop enlarging pelvic abscess  CT GUIDED TRANSGLUTEAL PELVIC ABSCESS DRAIN INSERTION  Date:  12/23/2011 16:35:00  Radiologist:  Judie Petit. Ruel Favors, M.D.  Medications:  2 mg Versed, 200 mcg Fentanyl  Guidance:  CT  Fluoroscopy time:  None.  Sedation time:  15 minutes  Contrast volume:  None.  Complications:  No immediate  PROCEDURE/FINDINGS:  Informed consent was obtained from the patient following explanation of the procedure, risks, benefits and alternatives.  The patient understands, agrees and consents for the procedure. All questions were addressed.  A time out was performed.  Maximal barrier sterile technique utilized including caps, mask, sterile gowns, sterile gloves, large sterile drape, hand hygiene, and betadine  Previous imaging reviewed.  The patient was positioned prone. Noncontrast localization CT performed.  Pelvic fluid collection in the perirectal region localized.  Under sterile conditions and local anesthesia, an 18 gauge Hawkins needle was advanced from a left transgluteal oblique approach into the fluid collection with CT guidance.  Needle position confirmed with CT.  Syringe aspiration yielded exudative fluid.  Gram stain cultures sent. Guide wire advanced.  Guide wire position confirmed with CT imaging.  Tract dilatation performed to advance a 10-French drain catheter with the retention loop formed in the cavity.  Position confirmed with CT.  Syringe aspiration yielded 70 ml exudative fluid.  Post procedure imaging demonstrates decompression of the fluid collection.  Catheter secured with a Prolene suture and connected to external suction bulb.  Sterile dressing applied.  No immediate complication.  The patient tolerated the procedure well.  IMPRESSION: Successful CT guided left transgluteal pelvic abscess drain insertion   Original Report Authenticated By: Judie Petit. Ruel Favors, M.D.     Scheduled Meds:   . ampicillin-sulbactam (UNASYN) IV  1.5 g Intravenous Q6H  . antiseptic oral rinse  15 mL Mouth Rinse q12n4p  . atorvastatin  20 mg Oral q1800  . chlorhexidine  15 mL Mouth Rinse BID  . digoxin  125 mcg Oral Daily  . diltiazem  30 mg Oral Q8H  . enoxaparin (LOVENOX) injection  40 mg Subcutaneous Q24H  . fentaNYL      . ferrous sulfate  325 mg Oral Q breakfast  . Fluticasone-Salmeterol  1 puff Inhalation Q12H  . insulin aspart  0-5 Units Subcutaneous QHS  . insulin aspart  0-9 Units Subcutaneous TID WC  . Ipratropium-Albuterol  1 puff  Inhalation QID  . metoCLOPramide (REGLAN) injection  10 mg Intravenous TID AC & HS  . midazolam      . omega-3 acid ethyl esters  1 g Oral Daily  . pantoprazole  40 mg Oral Q1200  . warfarin  3 mg Oral ONCE-1800  . Warfarin - Pharmacist Dosing Inpatient   Does not apply [phone]

## 2011-12-24 NOTE — Progress Notes (Signed)
6 Days Post-Op  Subjective: Pt doing ok; eating lunch; no new c/o;has some soreness at transgluteal  drain site  Objective: Vital signs in last 24 hours: Temp:  [97.8 F (36.6 C)-98.1 F (36.7 C)] 97.9 F (36.6 C) (08/22 1032) Pulse Rate:  [71-100] 82  (08/22 1032) Resp:  [16-18] 18  (08/22 1032) BP: (115-155)/(52-83) 121/66 mmHg (08/22 1032) SpO2:  [92 %-99 %] 97 % (08/22 1207) Last BM Date: 12/23/11  Intake/Output from previous day: 08/21 0701 - 08/22 0700 In: 5  Out: 1525 [Urine:1450; Drains:75] Intake/Output this shift: Total I/O In: 125 [P.O.:120; Other:5] Out: 320 [Urine:300; Drains:20]  TG/pelvic drain intact, insertion site ok, mildly tender, output 20 cc's blood-tinged fluid, cx's pend  Lab Results:   [facility] 12/24/11 0615  WBC 6.7  HGB 9.5*  HCT 29.3*  PLT 233   BMET  Basename 12/24/11 0615  NA 135  K 3.9  CL 99  CO2 30  GLUCOSE 149*  BUN 12  CREATININE 0.90  CALCIUM 8.0*   PT/INR  Basename 12/24/11 0615 12/23/11 0545  LABPROT 17.4* 19.5*  INR 1.40 1.62*   ABG No results found for this basename: PHART:2,PCO2:2,PO2:2,HCO3:2 in the last 72 hours  Studies/Results: Ct Guided Abscess Drain  12/23/2011  *RADIOLOGY REPORT*  Clinical Data: Postop enlarging pelvic abscess  CT GUIDED TRANSGLUTEAL PELVIC ABSCESS DRAIN INSERTION  Date:  12/23/2011 16:35:00  Radiologist:  Judie Petit. Ruel Favors, M.D.  Medications:  2 mg Versed, 200 mcg Fentanyl  Guidance:  CT  Fluoroscopy time:  None.  Sedation time:  15 minutes  Contrast volume:  None.  Complications:  No immediate  PROCEDURE/FINDINGS:  Informed consent was obtained from the patient following explanation of the procedure, risks, benefits and alternatives. The patient understands, agrees and consents for the procedure. All questions were addressed.  A time out was performed.  Maximal barrier sterile technique utilized including caps, mask, sterile gowns, sterile gloves, large sterile drape, hand hygiene, and betadine   Previous imaging reviewed.  The patient was positioned prone. Noncontrast localization CT performed.  Pelvic fluid collection in the perirectal region localized.  Under sterile conditions and local anesthesia, an 18 gauge Hawkins needle was advanced from a left transgluteal oblique approach into the fluid collection with CT guidance.  Needle position confirmed with CT.  Syringe aspiration yielded exudative fluid.  Gram stain cultures sent. Guide wire advanced.  Guide wire position confirmed with CT imaging.  Tract dilatation performed to advance a 10-French drain catheter with the retention loop formed in the cavity.  Position confirmed with CT.  Syringe aspiration yielded 70 ml exudative fluid.  Post procedure imaging demonstrates decompression of the fluid collection.  Catheter secured with a Prolene suture and connected to external suction bulb.  Sterile dressing applied.  No immediate complication.  The patient tolerated the procedure well.  IMPRESSION: Successful CT guided left transgluteal pelvic abscess drain insertion   Original Report Authenticated By: Judie Petit. Ruel Favors, M.D.    Results for orders placed during the hospital encounter of 12/11/11  URINE CULTURE     Status: Normal   Collection Time   12/11/11  2:24 PM      Component Value Range Status Comment   Specimen Description URINE, RANDOM   Final    Special Requests NONE   Final    Culture  Setup Time 12/12/2011 02:01   Final    Colony Count 8,000 COLONIES/ML   Final    Culture INSIGNIFICANT GROWTH   Final    Report  Status 12/13/2011 FINAL   Final   CULTURE, ROUTINE-ABSCESS     Status: Normal (Preliminary result)   Collection Time   12/23/11 10:04 AM      Component Value Range Status Comment   Specimen Description ABSCESS PETIONEAL CAVITY   Final    Special Requests NONE   Final    Gram Stain     Final    Value: ABUNDANT WBC PRESENT, PREDOMINANTLY PMN     NO SQUAMOUS EPITHELIAL CELLS SEEN     NO ORGANISMS SEEN   Culture NO GROWTH 1  DAY   Final    Report Status PENDING   Incomplete   ANAEROBIC CULTURE     Status: Normal (Preliminary result)   Collection Time   12/23/11 10:04 AM      Component Value Range Status Comment   Specimen Description ABSCESS PERITONEAL CAVITY   Final    Special Requests NONE   Final    Gram Stain PENDING   Incomplete    Culture     Final    Value: NO ANAEROBES ISOLATED; CULTURE IN PROGRESS FOR 5 DAYS   Report Status PENDING   Incomplete      Anti-infectives: Anti-infectives     Start     Dose/Rate Route Frequency Ordered Stop   12/11/11 2300   ampicillin-sulbactam (UNASYN) 1.5 g in sodium chloride 0.9 % 50 mL IVPB        1.5 g 100 mL/hr over 30 Minutes Intravenous 4 times per day 12/11/11 2244     12/11/11 1645   cefTRIAXone (ROCEPHIN) 1 g in dextrose 5 % 50 mL IVPB        1 g 100 mL/hr over 30 Minutes Intravenous  Once 12/11/11 1635 12/11/11 1837          Assessment/Plan: s/p pelvic abscess drainage 8/21; check final cx's, cont drain flushes; check f/u CT with poss drain injection once output diminishes or within 1 week of placement    LOS: 13 days    Natividad Schlosser,D [facility] 12/24/2011

## 2011-12-25 LAB — GLUCOSE, CAPILLARY
Glucose-Capillary: 170 mg/dL — ABNORMAL HIGH (ref 70–99)
Glucose-Capillary: 132 mg/dL — ABNORMAL HIGH (ref 70–99)
Glucose-Capillary: 135 mg/dL — ABNORMAL HIGH (ref 70–99)

## 2011-12-25 LAB — BASIC METABOLIC PANEL
BUN: 13 mg/dL (ref 6–23)
Chloride: 101 mEq/L (ref 96–112)
GFR calc Af Amer: >90 mL/min (ref >90)
GFR calc non Af Amer: 78 mL/min — ABNORMAL LOW (ref >90)
Potassium: 3.8 mEq/L (ref 3.5–5.1)
Sodium: 137 mEq/L (ref 135–145)

## 2011-12-25 LAB — PROTIME-INR
INR: 1.23 (ref 0.00–1.49)
Prothrombin Time: 15.8 seconds — ABNORMAL HIGH (ref 11.6–15.2)

## 2011-12-25 LAB — CBC
MCHC: 31.8 g/dL (ref 30.0–36.0)
Platelets: 251 10*3/uL (ref 150–400)
RDW: 15.1 % (ref 11.5–15.5)
WBC: 6.9 10*3/uL (ref 4.0–10.5)

## 2011-12-25 MED ORDER — WARFARIN SODIUM 3 MG PO TABS
3.0000 mg | ORAL_TABLET | Freq: Once | ORAL | Status: AC
  Administered 2011-12-25: 3 mg via ORAL
  Filled 2011-12-25: qty 1

## 2011-12-25 NOTE — Progress Notes (Signed)
Pt refuses CPAP at this time.  

## 2011-12-25 NOTE — Progress Notes (Signed)
ANTICOAGULATION CONSULT NOTE  Pharmacy Consult for warfarin Indication: hx afib  No Known Allergies  Patient Measurements: Height: 5\' 11"  (180.3 cm) Weight: 207 lb 3.2 oz (93.985 kg) IBW/kg (Calculated) : 75.3   Vital Signs: Temp: 99.1 F (37.3 C) (08/23 0529) Temp src: Oral (08/23 0529) BP: 125/67 mmHg (08/23 0529) Pulse Rate: 85  (08/23 0529)  Labs:  Basename 12/25/11 0536 12/24/11 0615 12/23/11 0545  HGB 10.0* 9.5* --  HCT 31.4* 29.3* --  PLT 251 233 --  APTT -- -- --  LABPROT 15.8* 17.4* 19.5*  INR 1.23 1.40 1.62*  HEPARINUNFRC -- -- --  CREATININE 0.97 0.90 --  CKTOTAL -- -- --  CKMB -- -- --  TROPONINI -- -- --   Estimated Creatinine Clearance: 75.9 ml/min (by C-G formula based on Cr of 0.97).   Medications:     . ampicillin-sulbactam (UNASYN) IV  1.5 g Intravenous Q6H  . antiseptic oral rinse  15 mL Mouth Rinse q12n4p  . atorvastatin  20 mg Oral q1800  . chlorhexidine  15 mL Mouth Rinse BID  . digoxin  125 mcg Oral Daily  . diltiazem  30 mg Oral Q8H  . enoxaparin (LOVENOX) injection  40 mg Subcutaneous Q24H  . ferrous sulfate  325 mg Oral Q breakfast  . Fluticasone-Salmeterol  1 puff Inhalation Q12H  . insulin aspart  0-5 Units Subcutaneous QHS  . insulin aspart  0-9 Units Subcutaneous TID WC  . Ipratropium-Albuterol  1 puff Inhalation QID  . metoCLOPramide (REGLAN) injection  10 mg Intravenous TID AC & HS  . omega-3 acid ethyl esters  1 g Oral Daily  . pantoprazole  40 mg Oral Q1200  . warfarin  3 mg Oral ONCE-1800  . Warfarin - Pharmacist Dosing Inpatient   Does not apply q1800   Assessment:  78 yo M admitted 8/9 with abdominal pain, history of rectal cancer with recurrence and intra-abdominal abscess   PTA on chronic warfarin for A.fib.   Had reports of rectal bleeding on admission, S/P Flex Sig - source of bleed was rectum.  Potential drug interaction with Unasyn (may increase INR)  Using lower warfarin doses due to risk of bleed and due  to INR > 6 after one dose of 2.5mg  on 8/10.  Coumadin held 8/20 for drain placement 8/21, resumed 8/22.  INR down to 1.23 today.  Current Lovenox 40mg  sq q24h.  Goal of Therapy:  INR 2-3   Plan:   Repeat warfarin 3mg  today.   Continue Lovenox 40mg  sq q24h, d/c when INR therapeutic.  Daily PT/INR.  Charolotte Eke, PharmD, pager [phone]. 12/25/2011,8:05 AM.

## 2011-12-25 NOTE — Progress Notes (Addendum)
[facility] PROGRESS NOTE  Vincent [name] ZOX:[id number] DOB: [birth year] DOA: 12/11/2011 PCP: Aura Dials, MD  Brief narrative: 78 year old male with previous history of rectal carcinoma (diagnosed in 2011) status post surgical resection and colostomy, radiation and chemotherapy who presented 12/11/2011 with complaints of abdominal pain and found to have a recurrent carcinoma with nodal metastases based on CT abdomen/pelvis findings. Patient is status post flexible sigmoidoscopy 12/18/2011 with findings consistent with rectal carcinoma. Hospital course further complicated by an enlarging pelvic abscess which required drainage  12/23/2011.  Assessment/Plan:  Principal Problem:  *Abdominal pain in the setting of recurrent rectal carcinoma with nodal metastases  - per Dr. Clelia Croft of oncology plan for outpatient chemotherapy  - status post flexible sigmoidoscopy with findings of rectal carcinoma filling almost full rectal stomp  - continue current pain regimen; was increased to 2 mg dilaudid Q3 hours PRN IV   Active Problem:  Lower GI bleed, rectal bleed  - secondary to recurrent rectal carcinoma in the setting of supratherapeutic INR  - s/p fresh frozen and vitamin K on 8/13  - GI and surgery following - hemoglobin dropped from 11.7 to 9.5, today hemoglobin 10   Acute blood loss anemia  - multifactorial, due to rectal bleed in the setting of supratherapeutic INR and rectal carcinoma  - hemoglobin 10 today   Atrial fibrillation  - continue lovenox and coumadin per pharmacy  - Heart rate from 82-87 - will continue Digoxin and already addedCardizem 30 mg Q 8 hours PO for better rate control   Intraabdominal abscess  - per CT findings on 8/9; 3.5 cm enlarging fluid collection in left pelvis   - status post drainage of abscess 8/21 with 70 cc gelatinous fluids removed  - on empiric ABX-unasyn ( since 8/9) - Patient has received a total of 15 days of antibiotics. There is no fever and no  elevated white blood cell count so we'll discontinue Unasyn today - Plan per radiology to repeat the CAT scan once the drainage output less than 25 cc in 24 hours. 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 recurrent metastatic rectal carcinoma  - resolved an sodium today at 137  Weakness generalized  - This is likely secondary to metastatic rectal carcinoma   Hypokalemia  - repleted  - potassium WNL today, 3.8  Code Status: full code  Family Communication: no family available at bedside  Disposition Plan: likely home when stable   Consultants:  Heme/onc ( Dr Clelia Croft)  GI ( Dr pyrtle)  Surgery ( Ger[place])  IR  Procedures:  Flexible sigmoidoscopy 12/18/2011  CT pelvic abscess drainage 12/23/2011   Antibiotics:  Unasyn 12/11/2011 -->12/25/2011  Manson Passey, MD  [facility] Pager [phone]  If 7PM-7AM, please contact night-coverage [email or web address] Password [email or web address] 12/25/2011, 12:03 PM   LOS: 14 days   HPI/Subjective: No acute events overnight.  Objective: Filed Vitals:   12/25/11 0156 12/25/11 0529 12/25/11 0735 12/25/11 1132  BP: 116/57 125/67    Pulse: 87 85    Temp: 98.3 F (36.8 C) 99.1 F (37.3 C)    TempSrc: Oral Oral    Resp: 18 18    Height:      Weight:      SpO2: 95% 95% 94% 97%    Intake/Output Summary (Last 24 hours) at 12/25/11 1203 Last data filed at  12/25/11 0950  Gross per 24 hour  Intake    490 ml  Output   1190 ml  Net   -700 ml    Exam:   General:  Pt is alert, follows commands appropriately, not in acute distress  Cardiovascular: Irregular rhythm, rate controlled rubs, no gallops  Respiratory: Clear to auscuRegular rate and rhythmltation bilaterally, no wheezing, no crackles, no rhonchi  Abdomen: Soft, non tender, non distended, bowel sounds present, colostomy bag present  Extremities: LE  edema, pulses DP and PT palpable bilaterally  Neuro: Grossly nonfocal  Data Reviewed: Basic Metabolic Panel:  Lab 12/25/11 9604 12/24/11 0615 12/21/11 0355 12/20/11 1239 12/19/11 0818  NA 137 135 136 135 139  K 3.8 3.9 3.6 3.3* 3.4*  CL 101 99 100 100 100  CO2 31 30 32 29 30  GLUCOSE 129* 149* 116* 120* 123*  BUN 13 12 20 23  34*  CREATININE 0.97 0.90 0.84 0.81 1.09  CALCIUM 8.1* 8.0* 8.4 7.9* 8.4   CBC:  Lab 12/25/11 0536 12/24/11 0615 12/21/11 0355 12/20/11 1239 12/19/11 0528  WBC 6.9 6.7 9.9 8.1 10.3  HGB 10.0* 9.5* 11.7* 10.7* 11.3*  HCT 31.4* 29.3* 36.3* 32.7* 35.3*  MCV 97.5 97.7 97.6 97.9 97.8  PLT 251 233 368 296 365   CBG:  Lab 12/25/11 0846 12/24/11 2110 12/24/11 1626 12/24/11 1201 12/24/11 0806  GLUCAP 170* 117* 136* 111* 143*    CULTURE, ROUTINE-ABSCESS     Status: Normal (Preliminary result)   Collection Time   12/23/11 10:04 AM      Component Value Range Status Comment   Specimen Description ABSCESS PETIONEAL CAVITY   Final    Special Requests NONE   Final    Gram Stain     Final    Value: ABUNDANT WBC PRESENT, PREDOMINANTLY PMN     NO SQUAMOUS EPITHELIAL CELLS SEEN     NO ORGANISMS SEEN   Culture FEW GRAM NEGATIVE RODS   Final    Report Status PENDING   Incomplete   ANAEROBIC CULTURE     Status: Normal (Preliminary result)   Collection Time   12/23/11 10:04 AM      Component Value Range Status Comment   Specimen Description ABSCESS PERITONEAL CAVITY   Final    Culture     Final    Value: NO ANAEROBES ISOLATED; CULTURE IN PROGRESS FOR 5 DAYS   Report Status PENDING   Incomplete      Studies: No results found.  Scheduled Meds:  . ampicillin-sulbactam   1.5 g Intravenous Q6H  . atorvastatin  20 mg Oral q1800  . digoxin  125 mcg Oral Daily  . diltiazem  30 mg Oral Q8H  . enoxaparin (LOVENOX)   40 mg Subcutaneous Q24H  . ferrous sulfate  325 mg Oral Q breakfast  . Fluticasone-Salmeterol  1 puff Inhalation Q12H  . insulin aspart  0-5 Units  Subcutaneous QHS  . insulin aspart  0-9 Units Subcutaneous TID WC  . Ipratropium-Albuterol  1 puff Inhalation QID  . metoCLOPramide   10 mg Intravenous TID AC & HS  . pantoprazole  40 mg Oral Q1200  . warfarin  3 mg Oral ONCE-1800

## 2011-12-25 NOTE — Progress Notes (Signed)
Subjective: Pt ok. Mild soreness at drain site, otherwise primary c/o is that ostomy bag not being emptied frequently.  Objective: Physical Exam: BP 125/67  Pulse 85  Temp 99.1 F (37.3 C) (Oral)  Resp 18  Ht 5\' 11"  (1.803 m)  Wt 207 lb 3.2 oz (93.985 kg)  BMI 28.90 kg/m2  SpO2 94% Drain intact, site clean, NT Output serous with some bloody stranding, about 90cc output recorded yesterday.   Labs: CBC  Basename 12/25/11 0536 12/24/11 0615  WBC 6.9 6.7  HGB 10.0* 9.5*  HCT 31.4* 29.3*  PLT 251 233   BMET  Basename 12/25/11 0536 12/24/11 0615  NA 137 135  K 3.8 3.9  CL 101 99  CO2 31 30  GLUCOSE 129* 149*  BUN 13 12  CREATININE 0.97 0.90  CALCIUM 8.1* 8.0*   LFT No results found for this basename: PROT,ALBUMIN,AST,ALT,ALKPHOS,BILITOT,BILIDIR,IBILI,LIPASE in the last 72 hours PT/INR  Basename 12/25/11 0536 12/24/11 0615  LABPROT 15.8* 17.4*  INR 1.23 1.40     Studies/Results: Ct Guided Abscess Drain  12/23/2011  *RADIOLOGY REPORT*  Clinical Data: Postop enlarging pelvic abscess  CT GUIDED TRANSGLUTEAL PELVIC ABSCESS DRAIN INSERTION  Date:  12/23/2011 16:35:00  Radiologist:  Judie Petit. Ruel Favors, M.D.  Medications:  2 mg Versed, 200 mcg Fentanyl  Guidance:  CT  Fluoroscopy time:  None.  Sedation time:  15 minutes  Contrast volume:  None.  Complications:  No immediate  PROCEDURE/FINDINGS:  Informed consent was obtained from the patient following explanation of the procedure, risks, benefits and alternatives. The patient understands, agrees and consents for the procedure. All questions were addressed.  A time out was performed.  Maximal barrier sterile technique utilized including caps, mask, sterile gowns, sterile gloves, large sterile drape, hand hygiene, and betadine  Previous imaging reviewed.  The patient was positioned prone. Noncontrast localization CT performed.  Pelvic fluid collection in the perirectal region localized.  Under sterile conditions and local anesthesia,  an 18 gauge Hawkins needle was advanced from a left transgluteal oblique approach into the fluid collection with CT guidance.  Needle position confirmed with CT.  Syringe aspiration yielded exudative fluid.  Gram stain cultures sent. Guide wire advanced.  Guide wire position confirmed with CT imaging.  Tract dilatation performed to advance a 10-French drain catheter with the retention loop formed in the cavity.  Position confirmed with CT.  Syringe aspiration yielded 70 ml exudative fluid.  Post procedure imaging demonstrates decompression of the fluid collection.  Catheter secured with a Prolene suture and connected to external suction bulb.  Sterile dressing applied.  No immediate complication.  The patient tolerated the procedure well.  IMPRESSION: Successful CT guided left transgluteal pelvic abscess drain insertion   Original Report Authenticated By: Judie Petit. Ruel Favors, M.D.     Assessment/Plan: s/p pelvic abscess drainage 8/21 Recommend f/u CT once output is lower, ~20-25cc/day.    LOS: 14 days    Brayton El PA-C 12/25/2011 9:23 AM

## 2011-12-26 LAB — GLUCOSE, CAPILLARY
Glucose-Capillary: 127 mg/dL — ABNORMAL HIGH (ref 70–99)
Glucose-Capillary: 151 mg/dL — ABNORMAL HIGH (ref 70–99)

## 2011-12-26 LAB — PROTIME-INR
INR: 1.2 (ref 0.00–1.49)
Prothrombin Time: 15.5 seconds — ABNORMAL HIGH (ref 11.6–15.2)

## 2011-12-26 MED ORDER — WARFARIN SODIUM 4 MG PO TABS
4.0000 mg | ORAL_TABLET | Freq: Once | ORAL | Status: AC
  Administered 2011-12-26: 4 mg via ORAL
  Filled 2011-12-26: qty 1

## 2011-12-26 NOTE — Progress Notes (Addendum)
[facility] PROGRESS NOTE  MESHULEM ONORATO ZOX:[id number] DOB: 09/05/1932 DOA: 12/11/2011 PCP: Aura Dials, MD  Brief narrative:  78 year old male with previous history of rectal carcinoma (diagnosed in 2011) status post surgical resection and colostomy, radiation and chemotherapy who presented 12/11/2011 with complaints of abdominal pain and found to have a recurrent carcinoma with nodal metastases based on CT abdomen/pelvis findings. Patient is status post flexible sigmoidoscopy 12/18/2011 with findings consistent with rectal carcinoma. Hospital course further complicated by an enlarging pelvic abscess which required drainage 12/23/2011. The plan is to repeat CT once output ~ 25 cc or less.  Assessment/Plan:   Principal Problem:  *Abdominal pain in the setting of recurrent rectal carcinoma with nodal metastases  - per oncology plan for outpatient chemotherapy  - status post flexible sigmoidoscopy with findings of rectal carcinoma filling almost full rectal stomp  - continue current pain regimen; 2 mg dilaudid Q3 hours PRN IV   Active Problem:  Lower GI bleed, rectal bleed  - secondary to recurrent rectal carcinoma in the setting of supratherapeutic INR  - patient is s/p fresh frozen and vitamin K on 8/13  - GI and surgery following  - hemoglobin stable   Acute blood loss anemia  - this is likely multifactorial, due to rectal bleed in the setting of supratherapeutic INR and rectal carcinoma  - hemoglobin 10  Atrial fibrillation  - continue lovenox and coumadin per pharmacy  - Heart rate controlled with digoxin and Cardizem  Intraabdominal abscess  - per CT findings on 8/9; 3.5 cm enlarging fluid collection in left pelvis  - status post drainage of abscess 8/21 with 70 cc gelatinous fluids removed  - Patient has received a total of 15 days of unasyn - plan to repeat CT once output less than 25 cc  Thrombocytopenia  - secondary to malignancy  - resolved, platelets 251    Hyponatremia  - 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 recurrent metastatic rectal carcinoma  - resolved   Weakness generalized  - This is likely secondary to metastatic rectal carcinoma   Hypokalemia  - repleted  - potassium WNL    Code Status: full code  Family Communication: no family available at bedside  Disposition Plan: likely home when stable   Consultants:  Heme/onc ( Dr Clelia Croft)  GI ( Dr pyrtle)  Surgery ( [facility])  IR Procedures:  Flexible sigmoidoscopy 12/18/2011  CT pelvic abscess drainage 12/23/2011  Antibiotics:  Unasyn 12/11/2011 -->12/25/2011   Manson Passey, MD  [facility] Pager [phone]  If 7PM-7AM, please contact night-coverage [email or web address] Password [email or web address] 12/26/2011, 9:01 AM   LOS: 15 days   HPI/Subjective: No acute overnight events.  Objective: Filed Vitals:   12/25/11 1936 12/25/11 2140 12/26/11 0540 12/26/11 0755  BP:  136/55 131/53   Pulse:  82 77   Temp:  98 F (36.7 C) 98.7 F (37.1 C)   TempSrc:  Oral Oral   Resp:  16 16   Height:      Weight:      SpO2: 92% 95% 94% 95%    Intake/Output Summary (Last 24 hours) at 12/26/11 0901 Last data filed at 12/26/11 8295  Gross per 24 hour  Intake      0 ml  Output   2060 ml  Net  -2060 ml    Exam:   General:  Pt is alert, follows commands appropriately, not in acute distress  Cardiovascular: Irregular rhythm, rate controlled, S1/S2, no murmurs, no rubs, no gallops  Respiratory:  Clear to auscultation bilaterally, no wheezing, no crackles, no rhonchi  Abdomen: Soft, tender around drainage site, non distended, bowel sounds present, no guarding  Extremities: LE edema, pulses DP and PT palpable bilaterally  Neuro: Grossly nonfocal  Data Reviewed: Basic Metabolic Panel:  Lab 12/25/11 2130 12/24/11 0615 12/21/11 0355 12/20/11 1239  NA 137 135 136 135  K 3.8 3.9 3.6 3.3*  CL 101 99 100 100  CO2 31 30 32 29  GLUCOSE 129* 149* 116* 120*  BUN 13 12 20 23   CREATININE 0.97 0.90  0.84 0.81  CALCIUM 8.1* 8.0* 8.4 7.9*  MG -- -- -- --  PHOS -- -- -- --   CBC:  Lab 12/25/11 0536 12/24/11 0615 12/21/11 0355 12/20/11 1239  WBC 6.9 6.7 9.9 8.1  NEUTROABS -- -- -- --  HGB 10.0* 9.5* 11.7* 10.7*  HCT 31.4* 29.3* 36.3* 32.7*  MCV 97.5 97.7 97.6 97.9  PLT 251 233 368 296   CBG:  Lab 12/25/11 1717 12/25/11 1222 12/25/11 0846 12/24/11 2110 12/24/11 1626  GLUCAP 135* 132* 170* 117* 136*    Recent Results (from the past 240 hour(s))  CULTURE, ROUTINE-ABSCESS     Status: Normal (Preliminary result)   Collection Time   12/23/11 10:04 AM      Component Value Range Status Comment   Specimen Description ABSCESS PETIONEAL CAVITY   Final    Special Requests NONE   Final    Gram Stain     Final    Value: ABUNDANT WBC PRESENT, PREDOMINANTLY PMN     NO SQUAMOUS EPITHELIAL CELLS SEEN     NO ORGANISMS SEEN   Culture FEW GRAM NEGATIVE RODS   Final    Report Status PENDING   Incomplete   ANAEROBIC CULTURE     Status: Normal (Preliminary result)   Collection Time   12/23/11 10:04 AM      Component Value Range Status Comment   Specimen Description ABSCESS PERITONEAL CAVITY   Final    Special Requests NONE   Final    Gram Stain PENDING   Incomplete    Culture     Final    Value: NO ANAEROBES ISOLATED; CULTURE IN PROGRESS FOR 5 DAYS   Report Status PENDING   Incomplete      Studies: No results found.  Scheduled Meds:   . antiseptic oral rinse  15 mL Mouth Rinse q12n4p  . atorvastatin  20 mg Oral q1800  . chlorhexidine  15 mL Mouth Rinse BID  . digoxin  125 mcg Oral Daily  . diltiazem  30 mg Oral Q8H  . enoxaparin (LOVENOX) injection  40 mg Subcutaneous Q24H  . ferrous sulfate  325 mg Oral Q breakfast  . Fluticasone-Salmeterol  1 puff Inhalation Q12H  . insulin aspart  0-5 Units Subcutaneous QHS  . insulin aspart  0-9 Units Subcutaneous TID WC  . Ipratropium-Albuterol  1 puff Inhalation QID  . metoCLOPramide (REGLAN) injection  10 mg Intravenous TID AC & HS  .  omega-3 acid ethyl esters  1 g Oral Daily  . pantoprazole  40 mg Oral Q1200  . warfarin  3 mg Oral ONCE-1800  . Warfarin - Pharmacist Dosing Inpatient   Does not apply q1800  . DISCONTD: ampicillin-sulbactam (UNASYN) IV  1.5 g Intravenous Q6H

## 2011-12-26 NOTE — Progress Notes (Signed)
ANTICOAGULATION CONSULT NOTE  Pharmacy Consult for warfarin Indication: hx afib  No Known Allergies  Patient Measurements: Height: 5\' 11"  (180.3 cm) Weight: 207 lb 3.2 oz (93.985 kg) IBW/kg (Calculated) : 75.3   Vital Signs: Temp: 98.7 F (37.1 C) (08/24 0540) Temp src: Oral (08/24 0540) BP: 131/53 mmHg (08/24 0540) Pulse Rate: 77  (08/24 0540)  Labs:  Basename 12/26/11 0523 12/25/11 0536 12/24/11 0615  HGB -- 10.0* 9.5*  HCT -- 31.4* 29.3*  PLT -- 251 233  APTT -- -- --  LABPROT 15.5* 15.8* 17.4*  INR 1.20 1.23 1.40  HEPARINUNFRC -- -- --  CREATININE -- 0.97 0.90  CKTOTAL -- -- --  CKMB -- -- --  TROPONINI -- -- --   Estimated Creatinine Clearance: 75.9 ml/min (by C-G formula based on Cr of 0.97).   Medications:     . antiseptic oral rinse  15 mL Mouth Rinse q12n4p  . atorvastatin  20 mg Oral q1800  . chlorhexidine  15 mL Mouth Rinse BID  . digoxin  125 mcg Oral Daily  . diltiazem  30 mg Oral Q8H  . enoxaparin (LOVENOX) injection  40 mg Subcutaneous Q24H  . ferrous sulfate  325 mg Oral Q breakfast  . Fluticasone-Salmeterol  1 puff Inhalation Q12H  . insulin aspart  0-5 Units Subcutaneous QHS  . insulin aspart  0-9 Units Subcutaneous TID WC  . Ipratropium-Albuterol  1 puff Inhalation QID  . metoCLOPramide (REGLAN) injection  10 mg Intravenous TID AC & HS  . omega-3 acid ethyl esters  1 g Oral Daily  . pantoprazole  40 mg Oral Q1200  . warfarin  3 mg Oral ONCE-1800  . Warfarin - Pharmacist Dosing Inpatient   Does not apply q1800  . DISCONTD: ampicillin-sulbactam (UNASYN) IV  1.5 g Intravenous Q6H   Assessment:  78 yo M admitted 8/9 with abdominal pain, history of rectal cancer with recurrence and intra-abdominal abscess   PTA on chronic warfarin for A.fib.   Had reports of rectal bleeding on admission, S/P Flex Sig - source of bleed was rectum.  Potential drug interaction with Unasyn (may increase INR-discontinued 8/24)  Using lower warfarin doses  due to risk of bleed and due to INR > 6 after one dose of 2.5mg  on 8/10.  Coumadin held 8/20 for drain placement 8/21, resumed 8/22.  INR unchanged after 2 x 3mg  doses  Current Lovenox 40mg  sq q24h.  Goal of Therapy:  INR 2-3   Plan:   Warfarin 4mg  today.   Continue Lovenox 40mg  sq q24h, d/c when INR therapeutic.  Daily PT/INR.  Otho Bellows PharmD Pager [phone] 12/26/2011 9:04 AM

## 2011-12-26 NOTE — Progress Notes (Signed)
Patient continues to refuse CPAP nocturnally.

## 2011-12-26 NOTE — Progress Notes (Signed)
8 Days Post-Op  Subjective: TG abscess drain placed 8/21 Better today  Objective: Vital signs in last 24 hours: Temp:  [98 F (36.7 C)-98.7 F (37.1 C)] 98.7 F (37.1 C) (08/24 0540) Pulse Rate:  [77-82] 77  (08/24 0540) Resp:  [16-18] 16  (08/24 0540) BP: (109-136)/(53-55) 131/53 mmHg (08/24 0540) SpO2:  [92 %-97 %] 95 % (08/24 0755) Last BM Date: 12/25/11  Intake/Output from previous day: 08/23 0701 - 08/24 0700 In: 120 [P.O.:120] Out: 2110 [Urine:1000; Drains:10; Stool:1100] Intake/Output this shift: Total I/O In: 120 [P.O.:120] Out: 250 [Urine:250]  PE: afeb; vss Drain intact; no bleeding; nt Output 10 cc yesterday Minimal in JP now: blood tinged Cx no growth Wbc wnl  Lab Results:   [facility] 12/25/11 0536 12/24/11 0615  WBC 6.9 6.7  HGB 10.0* 9.5*  HCT 31.4* 29.3*  PLT 251 233   BMET  Basename 12/25/11 0536 12/24/11 0615  NA 137 135  K 3.8 3.9  CL 101 99  CO2 31 30  GLUCOSE 129* 149*  BUN 13 12  CREATININE 0.97 0.90  CALCIUM 8.1* 8.0*   PT/INR  Basename 12/26/11 0523 12/25/11 0536  LABPROT 15.5* 15.8*  INR 1.20 1.23   ABG No results found for this basename: PHART:2,PCO2:2,PO2:2,HCO3:2 in the last 72 hours  Studies/Results: No results found.  Anti-infectives: Anti-infectives     Start     Dose/Rate Route Frequency Ordered Stop   12/11/11 2300   ampicillin-sulbactam (UNASYN) 1.5 g in sodium chloride 0.9 % 50 mL IVPB  Status:  Discontinued        1.5 g 100 mL/hr over 30 Minutes Intravenous 4 times per day 12/11/11 2244 12/26/11 0900   12/11/11 1645   cefTRIAXone (ROCEPHIN) 1 g in dextrose 5 % 50 mL IVPB        1 g 100 mL/hr over 30 Minutes Intravenous  Once 12/11/11 1635 12/11/11 1837          Assessment/Plan: s/p Procedure(s) (LRB): FLEXIBLE SIGMOIDOSCOPY (N/A)  TG drain intact Output minimal Afeb; wbc wnl Need re CT soon  to eval collection  Joell Buerger A 12/26/2011

## 2011-12-27 ENCOUNTER — Inpatient Hospital Stay (HOSPITAL_COMMUNITY): Payer: Medicare Other

## 2011-12-27 LAB — PROTIME-INR: INR: 1.41 (ref 0.00–1.49)

## 2011-12-27 LAB — CULTURE, ROUTINE-ABSCESS

## 2011-12-27 LAB — GLUCOSE, CAPILLARY
Glucose-Capillary: 165 mg/dL — ABNORMAL HIGH (ref 70–99)
Glucose-Capillary: 126 mg/dL — ABNORMAL HIGH (ref 70–99)

## 2011-12-27 MED ORDER — IOHEXOL 300 MG/ML  SOLN
100.0000 mL | Freq: Once | INTRAMUSCULAR | Status: AC | PRN
  Administered 2011-12-27: 100 mL via INTRAVENOUS

## 2011-12-27 MED ORDER — WARFARIN SODIUM 4 MG PO TABS
4.0000 mg | ORAL_TABLET | Freq: Once | ORAL | Status: AC
  Administered 2011-12-28: 4 mg via ORAL
  Filled 2011-12-27: qty 1

## 2011-12-27 NOTE — Progress Notes (Signed)
ANTICOAGULATION CONSULT NOTE  Pharmacy Consult for warfarin Indication: hx afib  No Known Allergies  Patient Measurements: Height: 5\' 11"  (180.3 cm) Weight: 207 lb 3.2 oz (93.985 kg) IBW/kg (Calculated) : 75.3   Vital Signs: Temp: 97.9 F (36.6 C) (08/25 0530) Temp src: Oral (08/25 0530) BP: 123/64 mmHg (08/25 0530) Pulse Rate: 68  (08/25 0530)  Labs:  Basename 12/27/11 0500 12/26/11 0523 12/25/11 0536  HGB -- -- 10.0*  HCT -- -- 31.4*  PLT -- -- 251  APTT -- -- --  LABPROT 17.5* 15.5* 15.8*  INR 1.41 1.20 1.23  HEPARINUNFRC -- -- --  CREATININE -- -- 0.97  CKTOTAL -- -- --  CKMB -- -- --  TROPONINI -- -- --   Estimated Creatinine Clearance: 75.9 ml/min (by C-G formula based on Cr of 0.97).  Medications:     . antiseptic oral rinse  15 mL Mouth Rinse q12n4p  . atorvastatin  20 mg Oral q1800  . chlorhexidine  15 mL Mouth Rinse BID  . digoxin  125 mcg Oral Daily  . diltiazem  30 mg Oral Q8H  . enoxaparin (LOVENOX) injection  40 mg Subcutaneous Q24H  . ferrous sulfate  325 mg Oral Q breakfast  . Fluticasone-Salmeterol  1 puff Inhalation Q12H  . insulin aspart  0-5 Units Subcutaneous QHS  . insulin aspart  0-9 Units Subcutaneous TID WC  . Ipratropium-Albuterol  1 puff Inhalation QID  . metoCLOPramide (REGLAN) injection  10 mg Intravenous TID AC & HS  . omega-3 acid ethyl esters  1 g Oral Daily  . pantoprazole  40 mg Oral Q1200  . warfarin  4 mg Oral ONCE-1800  . Warfarin - Pharmacist Dosing Inpatient   Does not apply q1800  . DISCONTD: ampicillin-sulbactam (UNASYN) IV  1.5 g Intravenous Q6H   Assessment:  78 yo M admitted 8/9 with abdominal pain, history of rectal cancer with recurrence and intra-abdominal abscess   PTA on chronic warfarin for A.fib.   Had reports of rectal bleeding on admission, S/P Flex Sig - source of bleed was rectum.  Potential drug interaction with Unasyn (may increase INR-discontinued 8/24)  Using lower warfarin doses due to risk  of bleed and due to INR > 6 after one dose of 2.5mg  on 8/10.  Coumadin held 8/20 for drain placement 8/21, resumed 8/22.  INR unchanged after 2 x 3mg  doses  Current Lovenox 40mg  sq q24h.  Goal of Therapy:  INR 2-3   Plan:   Warfarin 4mg  today.   Continue Lovenox 40mg  sq q24h, d/c when INR therapeutic.  Daily PT/INR.  Otho Bellows PharmD Pager [phone] 12/27/2011 8:44 AM

## 2011-12-27 NOTE — Progress Notes (Addendum)
[facility] PROGRESS NOTE  LAUREN AGUAYO WGN:[id number] DOB: May 02, 1933 DOA: 12/11/2011 PCP: Aura Dials, MD  Brief narrative: 78 year old male with previous history of rectal carcinoma (diagnosed in 2011) status post surgical resection and colostomy, radiation and chemotherapy who presented 12/11/2011 with complaints of abdominal pain and found to have a recurrent carcinoma with nodal metastases based on CT abdomen/pelvis findings. Patient is status post flexible sigmoidoscopy 12/18/2011 with findings consistent with rectal carcinoma. Hospital course further complicated by an enlarging pelvic abscess which required drainage 12/23/2011. Drainage is now minimal so the plan is to repeat CT abdomen/pelvis today.  Assessment/Plan:   Principal Problem:  *Abdominal pain in the setting of recurrent rectal carcinoma with nodal metastases  - per oncology plan for outpatient chemotherapy  - patient is status post flexible sigmoidoscopy with findings of rectal carcinoma filling almost full rectal stomp  - will continue current pain regimen; 2 mg dilaudid Q3 hours PRN IV   Active Problem:  Lower GI bleed, rectal bleed  - secondary to recurrent rectal carcinoma in the setting of supratherapeutic INR  - patient is s/p fresh frozen and vitamin K on 8/13  - appreciate GI and surgery following  - hemoglobin stable   Acute blood loss anemia  - this is likely multifactorial, due to rectal bleed in the setting of supratherapeutic INR and rectal carcinoma  - hemoglobin stable  Atrial fibrillation  - continue lovenox and coumadin per pharmacy  - Heart rate controlled with digoxin and Cardizem   Intraabdominal abscess  - per CT findings on 8/9; 3.5 cm enlarging fluid collection in left pelvis  - status post drainage of abscess 8/21 with 70 cc gelatinous fluids removed  - Patient has received a total of 15 days of unasyn  - follow up CT abd/pelvis with contrast today  Thrombocytopenia  - secondary  to malignancy  - resolved  Hyponatremia  - 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 recurrent metastatic rectal carcinoma  - resolved   Weakness generalized  - This is likely secondary to metastatic rectal carcinoma   Hypokalemia  - repleted  - potassium WNL   Code Status: full code  Family Communication: no family available at bedside; updated daughter over the phone 12/26/2011 Disposition Plan: likely home when stable   Consultants:  Heme/onc ( Dr Clelia Croft)  GI ( Dr pyrtle)  Surgery ( Gerkin)  IR Procedures:  Flexible sigmoidoscopy 12/18/2011  CT pelvic abscess drainage 12/23/2011  Antibiotics:  Unasyn 12/11/2011 -->12/25/2011  Manson Passey, MD  [facility] Pager [phone]  If 7PM-7AM, please contact night-coverage [email or web address] Password [facility] 12/27/2011, 8:32 AM   LOS: 16 days   HPI/Subjective: No acute events overnight.  Objective: Filed Vitals:   12/27/11 0130 12/27/11 0208 12/27/11 0530 12/27/11 0808  BP:  128/62 123/64   Pulse:  71 68   Temp:  98.5 F (36.9 C) 97.9 F (36.6 C)   TempSrc:  Oral Oral   Resp:  16 16   Height:      Weight:      SpO2: 97% 99% 99% 96%    Intake/Output Summary (Last 24 hours) at 12/27/11 9528 Last data filed at 12/27/11 0600  Gross per 24 hour  Intake    600 ml  Output   1500 ml  Net   -900 ml    Exam:   General:  Pt is alert, follows commands appropriately, not in acute distress  Cardiovascular: Irregular rhythm rate controlled, S1/S2, no murmurs, no rubs, no gallops  Respiratory: Clear to auscultation bilaterally, no wheezing, no crackles, no rhonchi  Abdomen: Soft, , non distended, bowel sounds present, no guarding  Extremities: LE edema, pulses DP and PT palpable bilaterally  Neuro: Grossly nonfocal  Data Reviewed: Basic Metabolic Panel:  Lab 12/25/11 1914 12/24/11 0615 12/21/11 0355 12/20/11 1239  NA 137 135 136 135  K 3.8 3.9 3.6 3.3*  CL 101 99 100 100  CO2 31 30 32 29  GLUCOSE 129* 149* 116* 120*    BUN 13 12 20 23   CREATININE 0.97 0.90 0.84 0.81  CALCIUM 8.1* 8.0* 8.4 7.9*  MG -- -- -- --  PHOS -- -- -- --   CBC:  Lab 12/25/11 0536 12/24/11 0615 12/21/11 0355 12/20/11 1239  WBC 6.9 6.7 9.9 8.1  HGB 10.0* 9.5* 11.7* 10.7*  HCT 31.4* 29.3* 36.3* 32.7*  MCV 97.5 97.7 97.6 97.9  PLT 251 233 368 296   CBG:  Lab 12/27/11 0745 12/26/11 2118 12/26/11 1731 12/26/11 1133 12/26/11 0729  GLUCAP 126* 151* 165* 117* 127*    CULTURE, ROUTINE-ABSCESS     Status: Normal (Preliminary result)   Collection Time   12/23/11 10:04 AM      Component Value Range Status Comment   Specimen Description ABSCESS PETIONEAL CAVITY   Final    Special Requests NONE   Final    Gram Stain     Final    Value: ABUNDANT WBC PRESENT, PREDOMINANTLY PMN     NO ORGANISMS SEEN   Culture FEW GRAM NEGATIVE RODS   Final    Report Status PENDING   Incomplete   ANAEROBIC CULTURE     Status: Normal (Preliminary result)   Collection Time   12/23/11 10:04 AM      Component Value Range Status Comment   Specimen Description ABSCESS   Final    Special Requests NONE   Final    Gram Stain PENDING   Incomplete    Culture     Final    Value: NO ANAEROBES ISOLATED; CULTURE IN PROGRESS FOR 5 DAYS   Report Status PENDING   Incomplete      Studies: No results found.  Scheduled Meds:   . atorvastatin  20 mg Oral q1800  . digoxin  125 mcg Oral Daily  . diltiazem  30 mg Oral Q8H  . enoxaparin (LOVENOX) i  40 mg Subcutaneous Q24H  . ferrous sulfate  325 mg Oral Q breakfast  . Fluticasone-Salmeterol  1 puff Inhalation Q12H  . insulin aspart  0-5 Units Subcutaneous QHS  . insulin aspart  0-9 Units Subcutaneous TID WC  . Ipratropium-Albuterol  1 puff Inhalation QID  . metoCLOPramide   10 mg Intravenous TID AC & HS  . omega-3 acid  1 g Oral Daily  . pantoprazole  40 mg Oral Q1200  . warfarin  4 mg Oral ONCE-1800

## 2011-12-28 LAB — ANAEROBIC CULTURE

## 2011-12-28 LAB — GLUCOSE, CAPILLARY: Glucose-Capillary: 124 mg/dL — ABNORMAL HIGH (ref 70–99)

## 2011-12-28 MED ORDER — GUAIFENESIN ER 600 MG PO TB12: 600.0000 mg | ORAL_TABLET | Freq: Two times a day (BID) | ORAL | Status: DC | PRN

## 2011-12-28 MED ORDER — ALUM & MAG HYDROXIDE-SIMETH 200-200-20 MG/5ML PO SUSP: 30.0000 mL | Freq: Four times a day (QID) | ORAL | Status: DC | PRN

## 2011-12-28 MED ORDER — FLUTICASONE-SALMETEROL 500-50 MCG/DOSE IN AEPB: 1.0000 | INHALATION_SPRAY | Freq: Two times a day (BID) | RESPIRATORY_TRACT | Status: DC

## 2011-12-28 MED ORDER — DIGOXIN 125 MCG PO TABS: 125.0000 ug | ORAL_TABLET | Freq: Every day | ORAL | Status: DC

## 2011-12-28 MED ORDER — FERROUS SULFATE 325 (65 FE) MG PO TABS: 325.0000 mg | ORAL_TABLET | Freq: Every day | ORAL | Status: DC

## 2011-12-28 MED ORDER — PANTOPRAZOLE SODIUM 40 MG PO TBEC: 40.0000 mg | DELAYED_RELEASE_TABLET | Freq: Every day | ORAL | Status: DC

## 2011-12-28 MED ORDER — WARFARIN SODIUM 5 MG PO TABS: 2.5000 mg | ORAL_TABLET | Freq: Every day | ORAL | Status: DC

## 2011-12-28 MED ORDER — ZOLPIDEM TARTRATE 5 MG PO TABS: 5.0000 mg | ORAL_TABLET | Freq: Every evening | ORAL | Status: DC | PRN

## 2011-12-28 MED ORDER — CIPROFLOXACIN HCL 500 MG PO TABS: 500.0000 mg | ORAL_TABLET | Freq: Two times a day (BID) | ORAL | Status: AC

## 2011-12-28 MED ORDER — DILTIAZEM HCL 30 MG PO TABS: 30.0000 mg | ORAL_TABLET | Freq: Three times a day (TID) | ORAL | Status: DC

## 2011-12-28 MED ORDER — WARFARIN SODIUM 4 MG PO TABS
4.0000 mg | ORAL_TABLET | Freq: Once | ORAL | Status: DC
  Filled 2011-12-28: qty 1

## 2011-12-28 MED ORDER — CIPROFLOXACIN IN D5W 400 MG/200ML IV SOLN
400.0000 mg | Freq: Two times a day (BID) | INTRAVENOUS | Status: DC
  Administered 2011-12-28: 400 mg via INTRAVENOUS
  Filled 2011-12-28 (×2): qty 200

## 2011-12-28 MED ORDER — ONDANSETRON HCL 4 MG PO TABS: 4.0000 mg | ORAL_TABLET | Freq: Four times a day (QID) | ORAL | Status: AC | PRN

## 2011-12-28 MED ORDER — OXYCODONE HCL 5 MG PO TABS: 5.0000 mg | ORAL_TABLET | ORAL | Status: AC | PRN

## 2011-12-28 MED ORDER — ALBUTEROL SULFATE HFA 108 (90 BASE) MCG/ACT IN AERS: 2.0000 | INHALATION_SPRAY | RESPIRATORY_TRACT | Status: DC | PRN

## 2011-12-28 MED ORDER — SIMVASTATIN 40 MG PO TABS: 40.0000 mg | ORAL_TABLET | Freq: Every day | ORAL | Status: AC

## 2011-12-28 MED ORDER — VITAMINS A & D EX OINT
TOPICAL_OINTMENT | CUTANEOUS | Status: AC
  Administered 2011-12-28: 14:00:00
  Filled 2011-12-28: qty 10

## 2011-12-28 MED ORDER — IPRATROPIUM-ALBUTEROL 18-103 MCG/ACT IN AERO: 1.0000 | INHALATION_SPRAY | Freq: Four times a day (QID) | RESPIRATORY_TRACT | Status: DC

## 2011-12-28 MED ORDER — HEPARIN SOD (PORK) LOCK FLUSH 100 UNIT/ML IV SOLN
INTRAVENOUS | Status: AC
  Filled 2011-12-28: qty 5

## 2011-12-28 MED ORDER — HYDROMORPHONE HCL 4 MG PO TABS: 4.0000 mg | ORAL_TABLET | ORAL | Status: AC | PRN

## 2011-12-28 NOTE — Discharge Summary (Signed)
Physician Discharge Summary  Vincent [name] TGG:[id number] DOB: 1933/03/24 DOA: 12/11/2011  PCP: Aura Dials, MD  Admit date: 12/11/2011 Discharge date: 12/28/2011  Discharge Diagnoses:  Principal Problem:  *Acute blood loss anemia Active Problems:  Rectal carcinoma  Abdominal pain  Thrombocytopenia  Hyponatremia  Weakness generalized  Abscess of male pelvis  Discharge Condition: Medically stable and appears clinically well for discharge home today. Patient has an abscess with percutaneous drainage 12/23/11.  Output declining - however CT reveals residual collection and antibiotics just started today for pseudomonas coverage.  Recommend continuing JP drain at this time minimal 3-5 days with daily flushes of 10 -15 ml and aspirate back on syringe after flushing to promote better drainage.  Will also need daily dressing changes and recordings of input and output to determine timing of drain removal (when output </= 10-17ml) which would be equal to flushing amount. Patient may call radiology at [phone] for drain removal once his output has reached this goal.   Diet recommendation: As tolerated  History of present illness:  78 year old male with previous history of rectal carcinoma (diagnosed in 2011) status post surgical resection and colostomy, radiation and chemotherapy who presented 12/11/2011 with complaints of abdominal pain and found to have a recurrent carcinoma with nodal metastases based on CT abdomen/pelvis findings. Patient is status post flexible sigmoidoscopy 12/18/2011 with findings consistent with rectal carcinoma. Hospital course further complicated by an enlarging pelvic abscess which required drainage 12/23/2011. CT abdomen and pelvis repeated with the findings of declining but still present fluid collection. Fluid culture analysis with findings of pseudomonas. Patient has completed 15 days of Unasyn. Once we found out that the patient grew Pseudomonas we have started ciprofloxacin  which patient is a sensitive to based on culture sensitivities. This will be the regimen for next 14 days upon discharge  Assessment/Plan:   Principal Problem:  *Abdominal pain in the setting of recurrent rectal carcinoma with nodal metastases  - per oncology plan for outpatient chemotherapy  - patient is status post flexible sigmoidoscopy with findings of rectal carcinoma filling almost full rectal stomp  - Current pain regiment on outpatient basis will be with Dilaudid 4 mg every 4 hours as needed for severe pain, Roxicodone 5 mg every 4 hours for breakthrough pain  Active Problem:  Lower GI bleed, rectal bleed  - secondary to recurrent rectal carcinoma in the setting of supratherapeutic INR  - patient is s/p fresh frozen and vitamin K on 8/13  - No further bleeds while off of aspirin - We will continue Coumadin 2.5 mg at bedtime and INR to be checked in one to 2 days upon discharge  Acute blood loss anemia  - this is likely multifactorial, due to rectal bleed in the setting of supratherapeutic INR and rectal carcinoma  - hemoglobin stable  -  Will continue to hold aspirin but may continue Coumadin  Atrial fibrillation  - Rate controlled with digoxin and Cardizem - Continue Coumadin per recommendations above  Intraabdominal abscess  - per CT findings on 8/9; 3.5 cm enlarging fluid collection in left pelvis  - status post drainage of abscess 8/21 with 70 cc gelatinous fluids removed  - Patient has received a total of 15 days of unasyn  - follow up CT abd/pelvis with contrast shows declining but still present fluid collection. Patient will be discharged home with the JP drain and instructions as indicated above. The peritoneal cavity fluid culture analysis revealed Pseudomonas sensitive to ciprofloxacin which we will continue  for 14 days upon discharge  Thrombocytopenia  - secondary to malignancy  - resolved   Hyponatremia  - 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 recurrent metastatic rectal  carcinoma  - resolved   Weakness generalized  - This is likely secondary to metastatic rectal carcinoma   Hypokalemia  - potassium WNL   Code Status: full code  Family Communication: no family available at bedside; updated daughter over the phone 12/26/2011  Disposition Plan:  home today  Consultants:  Heme/onc ( Dr Clelia Croft)  GI ( Dr pyrtle)  Surgery ( [facility])  IR Procedures:  Flexible sigmoidoscopy 12/18/2011  CT pelvic abscess drainage 12/23/2011  Antibiotics:  Unasyn 12/11/2011 -->12/25/2011 Cipro for 14 days upon discharge  Manson Passey, MD  [facility]  Pager [phone]   If 7PM-7AM, please contact night-coverage  [email or web address]  Password [email or web address]  12/27/2011, 8:32 AM  Discharge Exam: Filed Vitals:   12/28/11 0945  BP: 132/72  Pulse: 68  Temp: 97.8 F (36.6 C)  Resp: 18   Filed Vitals:   12/28/11 0135 12/28/11 0540 12/28/11 0815 12/28/11 0945  BP: 141/56 127/55  132/72  Pulse: 77 77  68  Temp: 97.9 F (36.6 C) 98 F (36.7 C)  97.8 F (36.6 C)  TempSrc: Oral Oral  Oral  Resp: 18 18  18   Height:      Weight:      SpO2: 98% 94% 94% 96%    General: Pt is alert, follows commands appropriately, not in acute distress Cardiovascular: Irregular rhythm, rate controlled, S1/S2 + Respiratory: Clear to auscultation bilaterally, no wheezing, no crackles, no rhonchi Abdominal: Soft, non tender, non distended, bowel sounds +, no guarding Extremities: +1 lower extremity edema, no cyanosis, pulses palpable bilaterally DP and PT Neuro: Grossly nonfocal  Discharge Instructions  Discharge Orders    Future Appointments: Provider: Department: Dept Phone: Center:   01/06/2012 8:00 AM Dava Najjar Idelle Jo [facility] [phone] None   01/06/2012 8:30 AM Benjiman Core, MD [facility] [phone] None     Future Orders Please Complete By Expires   Diet - low sodium heart healthy      Increase activity slowly      Discharge instructions      Comments:    Please follow the instructions: 1. patient has an abscess with percutaneous drainage 12/23/11.  Output declining - however CT reveals residual collection and antibiotics just started today for pseudomonas coverage.  Recommend continuing JP drain at this time minimal 3-5 days with daily flushes of 10 -15 ml and aspirate back on syringe after flushing to promote better drainage.  Will also need daily dressing changes and recordings of input and output to determine timing of drain removal (when output </= 10-21ml) which would be equal to flushing amount. Patient may call radiology at [phone] for drain removal once his output has reached this goal.  2. for abscess please use ciprofloxacin 500 mg twice daily for next 14 days. 3. per pharmacy recommendations, Coumadin 2.5 mg at bedtime and please recheck INR in the next one to 2 days to readjust the Coumadin dosage 4. please note that the order for home health PT, occupational therapy, RN and home health aide has been placed 5. pain regimen for lower extremity pain: Dilaudid 4 mg every 4 hours as needed for pain and Roxicodone 5 mg every 4 hours as needed for breakthrough pain 6. Please stop taking metformin 7. patient can stop taking aspirin due to acute blood loss anemia; may continue Coumadin  Call MD for:  persistant nausea and vomiting      Call MD for:  redness, tenderness, or signs of infection (pain, swelling, redness, odor or green/yellow discharge around incision site)      Call MD for:  severe uncontrolled pain      Call MD for:  difficulty breathing, headache or visual disturbances      Call MD for:  persistant dizziness or light-headedness        Medication List  As of 12/28/2011  2:08 PM   STOP taking these medications         aspirin 81 MG tablet      metFORMIN 500 MG tablet         TAKE these medications         albuterol 108 (90 BASE) MCG/ACT inhaler   Commonly known as: PROVENTIL HFA;VENTOLIN HFA   Inhale 2 puffs into the  lungs every 4 (four) hours as needed for wheezing or shortness of breath. WHEEZING      albuterol-ipratropium 18-103 MCG/ACT inhaler   Commonly known as: COMBIVENT   Inhale 1 puff into the lungs 4 (four) times daily. WHEEZING      alum & mag hydroxide-simeth 200-200-20 MG/5ML suspension   Commonly known as: MAALOX/MYLANTA   Take 30 mLs by mouth every 6 (six) hours as needed for indigestion (dyspepsia).      ciprofloxacin 500 MG tablet   Commonly known as: CIPRO   Take 1 tablet (500 mg total) by mouth 2 (two) times daily.      digoxin 0.125 MG tablet   Commonly known as: LANOXIN   Take 1 tablet (125 mcg total) by mouth daily.      diltiazem 30 MG tablet   Commonly known as: CARDIZEM   Take 1 tablet (30 mg total) by mouth every 8 (eight) hours.      ferrous sulfate 325 (65 FE) MG tablet   Take 1 tablet (325 mg total) by mouth daily with breakfast.      fish oil-omega-3 fatty acids 1000 MG capsule   Take 1 g by mouth daily.      Fluticasone-Salmeterol 500-50 MCG/DOSE Aepb   Commonly known as: ADVAIR   Inhale 1 puff into the lungs every 12 (twelve) hours.      guaiFENesin 600 MG 12 hr tablet   Commonly known as: MUCINEX   Take 1 tablet (600 mg total) by mouth 2 (two) times daily as needed.      HYDROmorphone 4 MG tablet   Commonly known as: DILAUDID   Take 1 tablet (4 mg total) by mouth every 4 (four) hours as needed for pain.      ondansetron 4 MG tablet   Commonly known as: ZOFRAN   Take 1 tablet (4 mg total) by mouth every 6 (six) hours as needed for nausea.      oxyCODONE 5 MG immediate release tablet   Commonly known as: Oxy IR/ROXICODONE   Take 1 tablet (5 mg total) by mouth every 4 (four) hours as needed.      pantoprazole 40 MG tablet   Commonly known as: PROTONIX   Take 1 tablet (40 mg total) by mouth daily at 12 noon.      simvastatin 40 MG tablet   Commonly known as: ZOCOR   Take 1 tablet (40 mg total) by mouth at bedtime.      warfarin 5 MG tablet    Commonly known as: COUMADIN   Take 0.5 tablets (2.5  mg total) by mouth daily. Pt takes 5 mg every day except on Monday and Friday pt takes 2.5mg  ( 1/2 tab of 5 mg tablet)      zolpidem 5 MG tablet   Commonly known as: AMBIEN   Take 1 tablet (5 mg total) by mouth at bedtime as needed for sleep (insomnia).           Follow-up Information    Follow up with BOUSKA,DAVID E, MD in 1 day. (to recheck INR and adjust coumadin dosage )    Contact information:   [address] [place] [address] [phone]           The results of significant diagnostics from this hospitalization (including imaging, microbiology, ancillary and laboratory) are listed below for reference.    Significant Diagnostic Studies: Dg Chest 1 View 12/20/2011  * IMPRESSION: Bronchitic changes.  Left base atelectasis.    Dg Chest 2 View 12/11/2011  * IMPRESSION: Mild hyperinflation consistent with COPD and/or asthma.  No acute cardiopulmonary disease.  Stable examination.    Ct Chest W Contrast 12/13/2011  * IMPRESSION: No evidence of metastatic disease in the chest.  Trace bilateral pleural effusions with associated minimal dependent atelectasis.    Ct Guided Abscess Drain 12/23/2011  * IMPRESSION: Successful CT guided left transgluteal pelvic abscess drain insertion     Ct Abdomen Pelvis W Contrast 12/27/2011  * IMPRESSION: Residual 2.4 x 2.0 cm left pericolonic fluid collection with indwelling pigtail drain, improved.  Additional 1.5 x 2.6 cm anterior perirectal fluid collection, decreased.  Recurrent rectal tumor with adjacent perirectal nodal metastases, unchanged.    Ct Abdomen Pelvis W Contrast 12/22/2011  ** IMPRESSION:    1.  Pelvic abscess adjacent to the rectal mass has enlarged.  2.  Suspected pneumatosis in the right colon.  This is of uncertain etiology although colonic ischemia is not confidently excluded. However, the celiac trunk and SMA appear patent, as does the inferior  mesenteric artery.  3.  New moderate sized bilateral pleural effusions with small pericardial effusion.  4.  Subcutaneous edema and diffuse mesenteric edema with mild ascites.  5.  Distended stomach and mildly distended small bowel may be from ileus or low grade/partial small-bowel obstruction.  Part of the ileum may be matted against the abscess and tumor in the pelvis.  6.  Bilobed infrarenal abdominal aortic aneurysm measuring up to 4.3 cm in diameter.  7.  Left colostomy with diverticulosis of the colostomy segment.    12/21/2011  * IMPRESSION:  1.  Pelvic abscess adjacent to the rectal mass has enlarged. 2.  Suspected pneumatosis in the right colon.  This is of uncertain etiology although colonic ischemia is not confidently excluded. However, the celiac trunk and SMA appear patent, as does the inferior mesenteric artery. 3.  New moderate sized bilateral pleural effusions with small pericardial effusion. 4.  Subcutaneous edema and diffuse mesenteric edema with mild ascites. 5.  Distended stomach and mildly distended small bowel may be from ileus or low grade/partial small-bowel obstruction.  Part of the ileum may be matted against the abscess and tumor in the pelvis. 6.  Bilobed infrarenal abdominal aortic aneurysm measuring up to 4.3 cm in diameter. 7.  Left colostomy with diverticulosis of the colostomy segment.   Ct Abdomen Pelvis W Contrast 12/11/2011  * IMPRESSION: Recurrent rectal tumor with associated 3.0 x 2.0 cm right perirectal nodal metastasis.  Additional left pelvic/perirectal fluid collections, as described above, worrisome for possible small abscess.  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  Left lower quadrant colostomy.  Parastomal hernia containing fat and a loop of nondilated small bowel.  No evidence of bowel obstruction.  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 Indwelling Foley catheter.     Dg Abd 2  Views 12/20/2011  * IMPRESSION: Gaseous distension of stomach and small bowel suggesting small bowel obstruction.   Small bowel wall thickening suggested in the mid abdomen.     Microbiology: CULTURE, ROUTINE-ABSCESS     Status: Normal   Collection Time   12/23/11 10:04 AM      Component Value Range Status Comment   Specimen Description ABSCESS PETIONEAL   Final    Value: ABUNDANT WBC PRESENT, PREDOMINANTLY PMN     NO SQUAMOUS EPITHELIAL CELLS SEEN     NO ORGANISMS SEEN   Culture PSEUDOMON AERUGINOSA   Final    Report Status 12/27/2011 FINAL   Final    Organism ID, Bacteria PSEUDOMONAS AERUGINOSA   Final   ANAEROBIC CULTURE     Status: Normal   Collection Time   12/23/11 10:04 AM      Component Value Range Status Comment   Specimen Description ABSCESS PERITONEAL   Final    Value: ABUNDANT WBC PRESENT,BOTH PMN AND MONONUCLEAR     NO SQUAMOUS EPITHELIAL CELLS SEEN     NO ORGANISMS SEEN   Culture NO ANAEROBES ISOLATED   Final    Report Status 12/28/2011 FINAL   Final      Labs: Basic Metabolic Panel:  Lab 12/25/11 4098 12/24/11 0615  NA 137 135  K 3.8 3.9  CL 101 99  CO2 31 30  GLUCOSE 129* 149*  BUN 13 12  CREATININE 0.97 0.90  CALCIUM 8.1* 8.0*   CBC:  Lab 12/25/11 0536 12/24/11 0615  WBC 6.9 6.7  HGB 10.0* 9.5*  HCT 31.4* 29.3*  MCV 97.5 97.7  PLT 251 233   CBG:  Lab 12/28/11 1256 12/28/11 0749 12/27/11 1702 12/27/11 1214 12/27/11 0745  GLUCAP 193* 124* 122* 126* 126*    Time coordinating discharge: Over 30 minutes  Signed:  Manson Passey, MD  [facility] 12/28/2011, 2:08 PM  Pager #: [phone]

## 2011-12-28 NOTE — Progress Notes (Signed)
ANTIBIOTIC CONSULT NOTE - INITIAL  Pharmacy Consult for Cipro Indication: pseudomonas in abscess cx   No Known Allergies  Patient Measurements: Height: 5\' 11"  (180.3 cm) Weight: 207 lb 3.2 oz (93.985 kg) IBW/kg (Calculated) : 75.3    Vital Signs: Temp: 98 F (36.7 C) (08/26 0540) Temp src: Oral (08/26 0540) BP: 127/55 mmHg (08/26 0540) Pulse Rate: 77  (08/26 0540) Intake/Output from previous day: 08/25 0701 - 08/26 0700 In: 480 [P.O.:480] Out: 2750 [Urine:1750; Stool:1000] Intake/Output from this shift:    Labs: No results found for this basename: WBC:3,HGB:3,PLT:3,LABCREA:3,CREATININE:3 in the last 72 hours Estimated Creatinine Clearance: 75.9 ml/min (by C-G formula based on Cr of 0.97).    Microbiology: Recent Results (from the past 720 hour(s))  URINE CULTURE     Status: Normal   Collection Time   12/11/11  2:24 PM      Component Value Range Status Comment   Specimen Description URINE, RANDOM   Final    Special Requests NONE   Final    Culture  Setup Time 12/12/2011 02:01   Final    Colony Count 8,000 COLONIES/ML   Final    Culture INSIGNIFICANT GROWTH   Final    Report Status 12/13/2011 FINAL   Final   CULTURE, ROUTINE-ABSCESS     Status: Normal   Collection Time   12/23/11 10:04 AM      Component Value Range Status Comment   Specimen Description ABSCESS PETIONEAL CAVITY   Final    Special Requests NONE   Final    Gram Stain     Final    Value: ABUNDANT WBC PRESENT, PREDOMINANTLY PMN     NO SQUAMOUS EPITHELIAL CELLS SEEN     NO ORGANISMS SEEN   Culture FEW PSEUDOMONAS AERUGINOSA   Final    Report Status 12/27/2011 FINAL   Final    Organism ID, Bacteria PSEUDOMONAS AERUGINOSA   Final   ANAEROBIC CULTURE     Status: Normal (Preliminary result)   Collection Time   12/23/11 10:04 AM      Component Value Range Status Comment   Specimen Description ABSCESS PERITONEAL CAVITY   Final    Special Requests NONE   Final    Gram Stain PENDING   Incomplete    Culture      Final    Value: NO ANAEROBES ISOLATED; CULTURE IN PROGRESS FOR 5 DAYS   Report Status PENDING   Incomplete     Medical History: Past Medical History  Diagnosis Date  . Hypertension   . Arthritis   . Heart disease   . SOB (shortness of breath)   . Malignant neoplasm of hepatic flexure   . Malignant neoplasm of rectosigmoid junction   . Cancer     and/or colon polyp  . Malignant neoplasm of hepatic flexure   . Malignant neoplasm of rectosigmoid junction   . Diabetes mellitus   . Atrial fibrillation     Medications:  Anti-infectives     Start     Dose/Rate Route Frequency Ordered Stop   12/28/11 1000   ciprofloxacin (CIPRO) IVPB 400 mg        400 mg 200 mL/hr over 60 Minutes Intravenous Every 12 hours 12/28/11 0950     12/11/11 2300   ampicillin-sulbactam (UNASYN) 1.5 g in sodium chloride 0.9 % 50 mL IVPB  Status:  Discontinued        1.5 g 100 mL/hr over 30 Minutes Intravenous 4 times per day 12/11/11 2244 12/26/11 0900  12/11/11 1645   cefTRIAXone (ROCEPHIN) 1 g in dextrose 5 % 50 mL IVPB        1 g 100 mL/hr over 30 Minutes Intravenous  Once 12/11/11 1635 12/11/11 1837         Assessment: 78 YOM w/ intraabdominal abscess per CT 8/9. S/P drain, cx = pseudomonas. Rec'd Unasyn 8/9-24 but not an anti-pseudomonal so Cipro to start today per Pharmacy. Pt w/ CrCl wnl, and WBC wnl 8/23. Note that Cipro may increase sensitivity to warfarin.  Goal of Therapy:  Appropriate dose of Cipro  Plan:   Cipro 400mg  IV q12h  Follow labs, vitals and cultures  Adjust dose as necessary  Will defer transition to PO to MD  Gwen Her PharmD  [phone] 12/28/2011 9:55 AM

## 2011-12-28 NOTE — Progress Notes (Signed)
Physical Therapy Treatment Patient Details Name: Vincent [name] MRN: [id number] DOB: 03-28-1933 Today's Date: 12/28/2011 Time: 2956-2130 PT Time Calculation (min): 35 min  PT Assessment / Plan / Recommendation Comments on Treatment Session  Significant decline in amb ability from 300' last visit to only 5' today due to MAX c/o L LE pain (ankle but starts from the hip down).  "I can't take it, it hurts so bad".  Reported to Dr Elisabeth Pigeon who entered the room. Pt stated pain started yesterday.    Follow Up Recommendations  Home health PT    Barriers to Discharge        Equipment Recommendations  None recommended by PT (has everything)    Recommendations for Other Services    Frequency Min 3X/week   Plan Discharge plan remains appropriate    Precautions / Restrictions Precautions Precautions: Fall Precaution Comments: 3 lts O2, colostomy, L drain Restrictions Weight Bearing Restrictions: No    Pertinent Vitals/Pain C/o MAX L LE pain    Mobility  Bed Mobility Bed Mobility: Supine to Sit Supine to Sit: 4: Min assist Details for Bed Mobility Assistance: Pt states, "let me do it" and was able to get B LE off bed but then needed a hand to help pull pt up for upper body mvt from supine to sit 2nd ABD discomfort and distention.  Transfers Transfers: Sit to Stand;Stand to Sit Sit to Stand: 4: Min guard;From elevated surface;From bed Stand to Sit: 4: Min guard;To chair/3-in-1 Details for Transfer Assistance: pt required increased time and an elevated surface  Ambulation/Gait Ambulation/Gait Assistance: 4: Min assist Ambulation Distance (Feet): 5 Feet Assistive device: Rolling walker Ambulation/Gait Assistance Details: significanr decline in amb ability from 300' last visit to only 5' this visit due to MAX c/o L LE pain (ankle) which pt states comes from the hip down.  Pt unable to fully weight bear thru L LE and stated, "It hurts too bad, started yesterday","I casn't take it". Gait  Pattern: Step-to pattern;Decreased stance time - left;Decreased stride length Gait velocity: decreased     PT Goals                     progressing    Visit Information  Last PT Received On: 12/28/11 Assistance Needed: +1    Subjective Data  Subjective: My left ankle hurts from my hip down Patient Stated Goal: home   Cognition    good   Balance   poor  End of Session PT - End of Session Equipment Utilized During Treatment: Gait belt;Oxygen Activity Tolerance: Patient limited by pain Patient left: in chair;with call bell/phone within reach   Felecia Shelling  PTA [facility]  Acute  Rehab Pager     [phone]

## 2011-12-28 NOTE — Progress Notes (Signed)
ANTICOAGULATION CONSULT NOTE  Pharmacy Consult for warfarin Indication: hx afib  No Known Allergies  Patient Measurements: Height: 5\' 11"  (180.3 cm) Weight: 207 lb 3.2 oz (93.985 kg) IBW/kg (Calculated) : 75.3   Vital Signs: Temp: 97.8 F (36.6 C) (08/26 0945) Temp src: Oral (08/26 0945) BP: 132/72 mmHg (08/26 0945) Pulse Rate: 68  (08/26 0945)  Labs:  Basename 12/28/11 0600 12/27/11 0500 12/26/11 0523  HGB -- -- --  HCT -- -- --  PLT -- -- --  APTT -- -- --  LABPROT 17.6* 17.5* 15.5*  INR 1.42 1.41 1.20  HEPARINUNFRC -- -- --  CREATININE -- -- --  CKTOTAL -- -- --  CKMB -- -- --  TROPONINI -- -- --   Estimated Creatinine Clearance: 75.9 ml/min (by C-G formula based on Cr of 0.97).  Medications:     . antiseptic oral rinse  15 mL Mouth Rinse q12n4p  . atorvastatin  20 mg Oral q1800  . chlorhexidine  15 mL Mouth Rinse BID  . ciprofloxacin  400 mg Intravenous Q12H  . digoxin  125 mcg Oral Daily  . diltiazem  30 mg Oral Q8H  . enoxaparin (LOVENOX) injection  40 mg Subcutaneous Q24H  . ferrous sulfate  325 mg Oral Q breakfast  . Fluticasone-Salmeterol  1 puff Inhalation Q12H  . insulin aspart  0-5 Units Subcutaneous QHS  . insulin aspart  0-9 Units Subcutaneous TID WC  . Ipratropium-Albuterol  1 puff Inhalation QID  . metoCLOPramide (REGLAN) injection  10 mg Intravenous TID AC & HS  . omega-3 acid ethyl esters  1 g Oral Daily  . pantoprazole  40 mg Oral Q1200  . warfarin  4 mg Oral ONCE-1800  . Warfarin - Pharmacist Dosing Inpatient   Does not apply q1800   Assessment:  78 yo M admitted 8/9 with abdominal pain, history of rectal cancer with recurrence and intra-abdominal abscess   PTA on chronic warfarin for A.fib. Dose PTA 5mg /day except 2.5mg  Mon and Fri  Had reports of rectal bleeding on admission, S/P Flex Sig - source of bleed was rectum thought to be d/t rectal ca.  Potential drug interaction with Cipro   Using conservative doses due to risk of  bleed and due to INR > 6 after one dose of 2.5mg  on 8/10.  Coumadin held 8/20 for drain placement 8/21, resumed 8/22.  INR slow to increase after 2 x 3mg  doses, 4mg  x 1  Concurrent Lovenox 40mg  sq q24h.  Goal of Therapy:  INR 2-3   Plan:   Warfarin 4mg  today.   Continue Lovenox 40mg  sq q24h, d/c when INR therapeutic.  Daily PT/INR.  Gwen Her PharmD  [phone] 12/28/2011 10:31 AM

## 2011-12-28 NOTE — Progress Notes (Signed)
10 Days Post-Op  Subjective: Up in chair. Really wanting to go home - see latest PT evaluation note - decline in ambulatory status.   Objective: Vital signs in last 24 hours: Temp:  [97.8 F (36.6 C)-98.1 F (36.7 C)] 97.8 F (36.6 C) (08/26 0945) Pulse Rate:  [68-77] 68  (08/26 0945) Resp:  [18-19] 18  (08/26 0945) BP: (127-141)/(54-72) 132/72 mmHg (08/26 0945) SpO2:  [94 %-98 %] 96 % (08/26 0945) Last BM Date: 12/26/11  Intake/Output from previous day: 08/25 0701 - 08/26 0700 In: 480 [P.O.:480] Out: 2750 [Urine:1750; Stool:1000] Intake/Output this shift: Total I/O In: 240 [P.O.:240] Out: 400 [Urine:400]  Physical exam :  Awake, denies pain except from "gas due to eating peaches."  Drain site - intact without evidence of drainage around catheter. Edematous buttocks and LE edema without erythema or pain with manipulation of drain.  Approximately 20 ml blood tinged serous fluid in JP bulb - output recordings last few days says "0"; however - generally anticipate at least the flushing amount to be returned (15 ml daily).  I flushed and aspirated his drain - good flush - aspirated back lots of bloody debris which may have been hindering drain output.  Patient denies pain with flush. Abdomen is soft, with positive bowel sounds.  JP bulb reconnected and charged. Patient made aware drain needs to remain at least 3-5 more days prior to removal given residual collection on CT scan and positive cultures for pseudomonas.   PT/INR :   Basename 12/28/11 0600 12/27/11 0500  LABPROT 17.6* 17.5*  INR 1.42 1.41    Studies/Results: Ct Abdomen Pelvis W Contrast  12/27/2011  *RADIOLOGY REPORT*  Clinical Data: Follow up abscess drain placed 12/23/2011.  CT ABDOMEN AND PELVIS WITH CONTRAST  Technique:  Multidetector CT imaging of the abdomen and pelvis was performed following the standard protocol during bolus administration of intravenous contrast.  Contrast: OMNIPAQUE IOHEXOL 300 MG/ML  SOLN   Comparison: 12/21/2011  Findings: Moderate bilateral pleural effusions, right greater than left, mildly increased.  Associated lower lobe atelectasis.  Multiple small probable hepatic cysts, unchanged.  Spleen, pancreas, and adrenal glands are within normal limits.  Gallbladder is underdistended.  No intrahepatic or extrahepatic ductal dilatation.  1.3 cm posterior interpolar left renal cyst.  Mild fullness of the left renal collecting system.  Right kidney is unremarkable.  No frank hydronephrosis.  No evidence of bowel obstruction.  Left lower quadrant colostomy. Stable rectal mass.  Right perirectal lymph nodes, unchanged.  1.5 x 2.6 cm anterior perirectal fluid collection (series 2/image 73), previously 2.4 x 3.8 cm, decreased.  Adjacent residual 2.4 x 2.0 cm left pericolonic fluid collection with indwelling pigtail drain, previously 2.8 x 5.1 cm, improved.  Atherosclerotic calcifications of the abdominal aorta and branch vessels.  Stable bilobed infrarenal abdominal aortic aneurysm.  Prostate and bladder are unremarkable.  Degenerative changes of the visualized thoracolumbar spine.  IMPRESSION: Residual 2.4 x 2.0 cm left pericolonic fluid collection with indwelling pigtail drain, improved.  Additional 1.5 x 2.6 cm anterior perirectal fluid collection, decreased.  Recurrent rectal tumor with adjacent perirectal nodal metastases, unchanged.  Multiple additional stable ancillary findings as above.   Original Report Authenticated By: Charline Bills, M.D.     Anti-infectives: Anti-infectives     Start     Dose/Rate Route Frequency Ordered Stop   12/28/11 1000   ciprofloxacin (CIPRO) IVPB 400 mg        400 mg 200 mL/hr over 60 Minutes Intravenous Every 12  hours 12/28/11 0950     12/11/11 2300   ampicillin-sulbactam (UNASYN) 1.5 g in sodium chloride 0.9 % 50 mL IVPB  Status:  Discontinued        1.5 g 100 mL/hr over 30 Minutes Intravenous 4 times per day 12/11/11 2244 12/26/11 0900   12/11/11 1645    cefTRIAXone (ROCEPHIN) 1 g in dextrose 5 % 50 mL IVPB        1 g 100 mL/hr over 30 Minutes Intravenous  Once 12/11/11 1635 12/11/11 1837          Assessment/Plan:  Rectal cancer with abscess s/p percutaneous drainage 12/23/11.  Output declining - however CT reveals residual collection and anti-biotics just started today for pseudomonas coverage.  Recommend continuing JP drain at this time minimal 3-5 days with daily flushes of 10 -15 ml and aspirate back on syringe after flushing to promote better drainage.  Will also need daily dressing changes and recordings of input and output to determine timing of drain removal (when output </= 10-9ml) which would be equal to flushing amount. Patient may call radiology at [phone] for drain removal once his output has reached this goal.    LOS: 17 days    Baine Decesare D 12/28/2011

## 2012-01-01 ENCOUNTER — Telehealth: Payer: Self-pay | Admitting: Physician Assistant

## 2012-01-01 DIAGNOSIS — K651 Peritoneal abscess: Secondary | ICD-10-CM

## 2012-01-01 DIAGNOSIS — C20 Malignant neoplasm of rectum: Secondary | ICD-10-CM

## 2012-01-01 NOTE — Progress Notes (Signed)
Telephone call from patient - drain intact and continues to give output of approximately 25 cc daily.  Has not been flushed by [facility].  Message left with [facility] to flush drain during visits with 10 ml NS.  Patient to continue to record output from drain and return to radiology on 01/06/12 after seeing Dr. Clelia Croft for his drain to be evaluated for possible removal.  Patient to finish his 14 day course of cipro for pseudomonas cultures from drain.

## 2012-01-05 ENCOUNTER — Emergency Department (HOSPITAL_COMMUNITY): Payer: Medicare Other

## 2012-01-05 ENCOUNTER — Emergency Department (HOSPITAL_COMMUNITY)
Admission: EM | Admit: 2012-01-05 | Discharge: 2012-01-05 | Disposition: A | Discharge status: Home / Self Care | Payer: Medicare Other | Attending: Emergency Medicine | Admitting: Emergency Medicine

## 2012-01-05 ENCOUNTER — Encounter (HOSPITAL_COMMUNITY): Payer: Self-pay | Admitting: Emergency Medicine

## 2012-01-05 ENCOUNTER — Telehealth: Payer: Self-pay | Admitting: *Deleted

## 2012-01-05 ENCOUNTER — Other Ambulatory Visit: Payer: Self-pay | Admitting: *Deleted

## 2012-01-05 ENCOUNTER — Encounter: Payer: Self-pay | Admitting: *Deleted

## 2012-01-05 DIAGNOSIS — Z79899 Other long term (current) drug therapy: Secondary | ICD-10-CM | POA: Insufficient documentation

## 2012-01-05 DIAGNOSIS — D649 Anemia, unspecified: Secondary | ICD-10-CM | POA: Insufficient documentation

## 2012-01-05 DIAGNOSIS — I10 Essential (primary) hypertension: Secondary | ICD-10-CM | POA: Insufficient documentation

## 2012-01-05 DIAGNOSIS — J4489 Other specified chronic obstructive pulmonary disease: Secondary | ICD-10-CM | POA: Insufficient documentation

## 2012-01-05 DIAGNOSIS — I4891 Unspecified atrial fibrillation: Secondary | ICD-10-CM | POA: Insufficient documentation

## 2012-01-05 DIAGNOSIS — J449 Chronic obstructive pulmonary disease, unspecified: Secondary | ICD-10-CM | POA: Insufficient documentation

## 2012-01-05 DIAGNOSIS — R109 Unspecified abdominal pain: Secondary | ICD-10-CM | POA: Insufficient documentation

## 2012-01-05 DIAGNOSIS — M7989 Other specified soft tissue disorders: Secondary | ICD-10-CM

## 2012-01-05 DIAGNOSIS — K651 Peritoneal abscess: Secondary | ICD-10-CM | POA: Insufficient documentation

## 2012-01-05 DIAGNOSIS — R112 Nausea with vomiting, unspecified: Secondary | ICD-10-CM | POA: Insufficient documentation

## 2012-01-05 DIAGNOSIS — C19 Malignant neoplasm of rectosigmoid junction: Secondary | ICD-10-CM | POA: Insufficient documentation

## 2012-01-05 DIAGNOSIS — C772 Secondary and unspecified malignant neoplasm of intra-abdominal lymph nodes: Secondary | ICD-10-CM | POA: Insufficient documentation

## 2012-01-05 DIAGNOSIS — R5381 Other malaise: Secondary | ICD-10-CM | POA: Insufficient documentation

## 2012-01-05 DIAGNOSIS — R531 Weakness: Secondary | ICD-10-CM

## 2012-01-05 DIAGNOSIS — C20 Malignant neoplasm of rectum: Secondary | ICD-10-CM

## 2012-01-05 DIAGNOSIS — M79609 Pain in unspecified limb: Secondary | ICD-10-CM | POA: Insufficient documentation

## 2012-01-05 LAB — CBC WITH DIFFERENTIAL/PLATELET
Basophils Absolute: 0 10*3/uL (ref 0.0–0.1)
Basophils Absolute: 0 10*3/uL (ref 0.0–0.1)
Basophils Relative: 1 % (ref 0–1)
EOS%: 1.9 % (ref 0.0–7.0)
Eosinophils Absolute: 0.1 10*3/uL (ref 0.0–0.5)
Eosinophils Relative: 1 % (ref 0–5)
HCT: 26.7 % — ABNORMAL LOW (ref 39.0–52.0)
HGB: 9.6 g/dL — ABNORMAL LOW (ref 13.0–17.1)
Lymphocytes Relative: 8 % — ABNORMAL LOW (ref 12–46)
MCHC: 33 g/dL (ref 30.0–36.0)
MCV: 95.4 fL (ref 78.0–100.0)
MONO#: 0.6 10*3/uL (ref 0.1–0.9)
Monocytes Absolute: 0.5 10*3/uL (ref 0.1–1.0)
NEUT#: 5.1 10*3/uL (ref 1.5–6.5)
Platelets: 222 10*3/uL (ref 150–400)
RDW: 15.4 % — ABNORMAL HIGH (ref 11.0–14.6)
RDW: 15.5 % (ref 11.5–15.5)
WBC: 6.2 10*3/uL (ref 4.0–10.5)
lymph#: 0.3 10*3/uL — ABNORMAL LOW (ref 0.9–3.3)

## 2012-01-05 LAB — URINALYSIS, ROUTINE W REFLEX MICROSCOPIC
Glucose, UA: NEGATIVE mg/dL
Ketones, ur: NEGATIVE mg/dL
Leukocytes, UA: NEGATIVE
Nitrite: NEGATIVE
Specific Gravity, Urine: 1.013 (ref 1.005–1.030)
pH: 7 (ref 5.0–8.0)

## 2012-01-05 LAB — COMPREHENSIVE METABOLIC PANEL
ALT: 17 U/L (ref 0–53)
AST: 19 U/L (ref 0–37)
Albumin: 2.4 g/dL — ABNORMAL LOW (ref 3.5–5.2)
Calcium: 8.6 mg/dL (ref 8.4–10.5)
Creatinine, Ser: 1.11 mg/dL (ref 0.50–1.35)
Sodium: 134 mEq/L — ABNORMAL LOW (ref 135–145)
Total Protein: 6 g/dL (ref 6.0–8.3)

## 2012-01-05 LAB — COMPREHENSIVE METABOLIC PANEL (CC13)
ALT: 21 U/L (ref 0–55)
BUN: 13 mg/dL (ref 7.0–26.0)
CO2: 28 mEq/L (ref 22–29)
Chloride: 98 mEq/L (ref 98–107)
Creatinine: 1.1 mg/dL (ref 0.7–1.3)
Glucose: 155 mg/dl — ABNORMAL HIGH (ref 70–99)

## 2012-01-05 LAB — CORRECTED CALCIUM (CC13): Calcium, Corrected: 10 mg/dL (ref 8.4–10.4)

## 2012-01-05 LAB — PROTIME-INR: INR: 1.69 — ABNORMAL HIGH (ref 0.00–1.49)

## 2012-01-05 LAB — CEA: CEA: 5.7 ng/mL — ABNORMAL HIGH (ref 0.0–5.0)

## 2012-01-05 MED ORDER — IOHEXOL 300 MG/ML  SOLN
100.0000 mL | Freq: Once | INTRAMUSCULAR | Status: AC | PRN
  Administered 2012-01-05: 100 mL via INTRAVENOUS

## 2012-01-05 NOTE — Progress Notes (Signed)
Asked to come see pt in ER regarding transgluteal pelvic abscess drain. He was supposed to come by tomorrow for [place] to assess drain and possibly remove. He ended up in the Er and a repeat CT scan was done. He reports his drain hasn't drained much of anything. They flushed it here in the ER and it looks like serous/flush return only.  CT was reviewed with Dr. Lowella Dandy, primary fluid collection is completely resolved. Secondary collection is smaller but out of reach of drain. Dr. Lowella Dandy feels that drain can be removed.  Drain site clean Drain removed without difficulty. Dressing applied. Pt does not need to follow up tomorrow in IR as planned. Dressing can be removed tomorrow and shower is then ok.  Brayton El [facility] 01/05/2012 2:43 PM

## 2012-01-05 NOTE — ED Provider Notes (Signed)
Complains of left flank pain, left leg pain and swelling onset 3 days ago. Presently denies nausea denies vomiting no urinary symptoms no fever no appetite change. Treated himself oxycodone this morning pain is mild. On exam alert Glasgow Coma Score 15 abdomen colostomy in place left lower quadrant putting him brown stool nontender back with a Jackson-Pratt drain coming from the left flank area no flank tenderness genitalia normal male bilateral lower extremities with 1+ pretibial pitting edema, neurovascularly intact  Vincent Sou, MD 01/05/12 1045

## 2012-01-05 NOTE — ED Provider Notes (Signed)
Medical screening examination/treatment/procedure(s) were conducted as a shared visit with non-physician practitioner(s) and myself.  I personally evaluated the patient during the encounter  Carolos Fecher, MD 01/05/12 1550 

## 2012-01-05 NOTE — Telephone Encounter (Signed)
PT. WAS DISCHARGED FROM THE HOSPITAL ONE AND A HALF WEEKS AGO. WHILE HE WAS IN THE HOSPITAL A "TUBE WAS PLACED IN HIS PELVIC AREA DUE TO AN ABSCESS." ON 01/03/12 PT. NOTICED "PUS IN THE TUBE AND IT WAS NOT DRAINING." ON 01/04/12 HIS LEFT FOOT WAS VERY SWOLLEN AND IT HAS NOT DECEASED WITH ELEVATION. PT. HAS NOT TAKEN HIS TEMPERATURE BUT DOES NOT THINK HE HAS A FEVER. THIS INFORMATION TO DR.SHADAD'S NURSE, DIXIE SMITH,RN.

## 2012-01-05 NOTE — Progress Notes (Signed)
Patient in lobby, c/o feeling sick, left leg swollen, drainage tube in left side is clogged with what looks like pus and is not draining. Patient visibly shaking. State he doesn't  Feel like he can sit and needs to lie down. Per dr Clelia Croft, labs only and proceed to the E.R. Patient and family verbalized understanding.

## 2012-01-05 NOTE — Progress Notes (Signed)
VASCULAR LAB PRELIMINARY  PRELIMINARY  PRELIMINARY  PRELIMINARY  Left lower extremity venous duplex completed.    Preliminary report:  Left:  No evidence of DVT, superficial thrombosis, or Baker's cyst.  Beatriz Settles, RVS 01/05/2012, 11:05 AM

## 2012-01-05 NOTE — ED Notes (Signed)
Patient transported to CT 

## 2012-01-05 NOTE — ED Notes (Signed)
Pt from ca center.  Pt states he has colon ca and has a colostomy and a bulb drain for draining an abscess.  Pt states bulb drain has not drained in several days despite pus in drain.  Bulb is compressed and pus seen in tube.  Pt denies difficulty urinating.  Pt had some sob after changing, but states that is normal for him.  Placed on heart monitor, NSR.

## 2012-01-05 NOTE — ED Provider Notes (Signed)
History     CSN: [id number]  Arrival date & time 01/05/12  7829   First MD Initiated Contact with Patient 01/05/12 [phone]      Chief Complaint  Patient presents with  . Flank Pain    (Consider location/radiation/quality/duration/timing/severity/associated sxs/prior treatment) HPI Comments: Patient with hx rectal cancer with nodal metastases, recent hospitalization 8/9-8/26 for blood loss anemia (rectal bleeding from tumor + supratherapeutic INR), rectal carcinoma, pelvic abscess.  Pt d/c home on cipro, d/c instructions have instructions for 5xdaily flushing of JP drain and recording output - pt states he did not know about this and has not been flushing the drain or recording output.  Pt notes decreased output from drain.  For the past two days, pt has had increased pain around the drain site as well as left leg pain and swelling.  Is using dilaudid and oxycodone without relief.  Pt reports generalized weakness, decreased appetite, had episodes of N/V 3 days ago (contents of stomach) and has noted decreased colostomy output.  Denies fevers, CP, cough.  Pt has chronic SOB from COPD, notes that this is unchanged.  Has been drinking plenty of fluids, eating small meals.    Patient is a 78 y.o. male presenting with flank pain. The history is provided by the patient.  Flank Pain Associated symptoms include nausea and vomiting. Pertinent negatives include no chest pain, coughing or fever.    Past Medical History  Diagnosis Date  . Hypertension   . Arthritis   . Heart disease   . SOB (shortness of breath)   . Malignant neoplasm of hepatic flexure   . Malignant neoplasm of rectosigmoid junction   . Cancer     and/or colon polyp  . Malignant neoplasm of hepatic flexure   . Malignant neoplasm of rectosigmoid junction   . Diabetes mellitus   . Atrial fibrillation     Past Surgical History  Procedure Date  . Back surgery [phone]  . Arthroscopic knee 1992  . Adenocarcinoma of the rectum  [id number]  . Exploratory laparotomy 02/2010  . Rectosigmoid colon resection 02/2010  . Colostomy 02/2010  . Drainage of intra-abdominal abscess 02/2010  . Flexible sigmoidoscopy 12/18/2011    Procedure: FLEXIBLE SIGMOIDOSCOPY;  Surgeon: Beverley Fiedler, MD;  Location: [facility];  Service: Gastroenterology;  Laterality: N/A;    History reviewed. No pertinent family history.  History  Substance Use Topics  . Smoking status: Never Smoker   . Smokeless tobacco: Never Used  . Alcohol Use: No      Review of Systems  Constitutional: Positive for appetite change. Negative for fever.  Respiratory: Positive for shortness of breath. Negative for cough.        SOB is chronic, unchanged  Cardiovascular: Positive for leg swelling. Negative for chest pain.  Gastrointestinal: Positive for nausea and vomiting. Negative for diarrhea.  Genitourinary: Positive for flank pain.  Neurological: Negative for dizziness and light-headedness.  All other systems reviewed and are negative.    Allergies  Review of patient's allergies indicates no known allergies.  Home Medications   Current Outpatient Rx  Name Route Sig Dispense Refill  . CIPROFLOXACIN HCL 500 MG PO TABS Oral Take 1 tablet (500 mg total) by mouth 2 (two) times daily. 28 tablet 0  . DILTIAZEM HCL 30 MG PO TABS Oral Take 1 tablet (30 mg total) by mouth every 8 (eight) hours. 90 tablet 0  . FERROUS SULFATE 325 (65 FE) MG PO TABS Oral Take 1 tablet (325 mg total)  by mouth daily with breakfast. 30 tablet 0  . HYDROMORPHONE HCL 4 MG PO TABS Oral Take 1 tablet (4 mg total) by mouth every 4 (four) hours as needed for pain. 60 tablet 0  . OXYCODONE HCL 5 MG PO TABS Oral Take 1 tablet (5 mg total) by mouth every 4 (four) hours as needed. 45 tablet 0  . PANTOPRAZOLE SODIUM 40 MG PO TBEC Oral Take 1 tablet (40 mg total) by mouth daily at 12 noon. 30 tablet 0  . ZOLPIDEM TARTRATE 5 MG PO TABS Oral Take 1 tablet (5 mg total) by mouth at bedtime as  needed for sleep (insomnia). 30 tablet 0  . ALBUTEROL SULFATE HFA 108 (90 BASE) MCG/ACT IN AERS Inhalation Inhale 2 puffs into the lungs every 4 (four) hours as needed for wheezing or shortness of breath. WHEEZING 1 Inhaler 11  . IPRATROPIUM-ALBUTEROL 18-103 MCG/ACT IN AERO Inhalation Inhale 1 puff into the lungs 4 (four) times daily. WHEEZING 1 Inhaler 11  . ALUM & MAG HYDROXIDE-SIMETH 200-200-20 MG/5ML PO SUSP Oral Take 30 mLs by mouth every 6 (six) hours as needed for indigestion (dyspepsia). 355 mL 0  . DIGOXIN 0.125 MG PO TABS Oral Take 1 tablet (125 mcg total) by mouth daily. 30 tablet 0  . OMEGA-3 FATTY ACIDS 1000 MG PO CAPS Oral Take 1 g by mouth daily.      Marland Kitchen FLUTICASONE-SALMETEROL 500-50 MCG/DOSE IN AEPB Inhalation Inhale 1 puff into the lungs every 12 (twelve) hours. 60 each 3  . GUAIFENESIN ER 600 MG PO TB12 Oral Take 1 tablet (600 mg total) by mouth 2 (two) times daily as needed. 30 tablet 0  . METFORMIN HCL 500 MG PO TABS      . ONDANSETRON HCL 4 MG PO TABS Oral Take 1 tablet (4 mg total) by mouth every 6 (six) hours as needed for nausea. 20 tablet 0  . SIMVASTATIN 40 MG PO TABS Oral Take 1 tablet (40 mg total) by mouth at bedtime. 30 tablet 0  . WARFARIN SODIUM 5 MG PO TABS Oral Take 0.5 tablets (2.5 mg total) by mouth daily. Pt takes 5 mg every day except on Monday and Friday pt takes 2.5mg  ( 1/2 tab of 5 mg tablet) 30 tablet 0    BP 117/55  Pulse 68  Temp 97.8 F (36.6 C) (Oral)  Resp 18  SpO2 93%  Physical Exam  Nursing note and vitals reviewed. Constitutional: He appears well-developed and well-nourished. No distress.  HENT:  Head: Normocephalic and atraumatic.  Neck: Neck supple.  Cardiovascular: Normal rate and regular rhythm.   Pulmonary/Chest: Effort normal. No respiratory distress. He has wheezes. He has no rales.  Abdominal: Soft. He exhibits no distension. There is tenderness in the suprapubic area. There is no rebound and no guarding.    Genitourinary:  Rectal exam shows tenderness.       Rectal shows small specks of blood on glove.    Musculoskeletal:       Left leg slightly larger than right.  Nontender.  No erythema, warmth.  Bilateral feet with neuropathy- painful to touch (unchanged).  Dorsalis pedis pulses are intact and equal bilaterally.    Neurological: He is alert. He exhibits normal muscle tone.  Skin: He is not diaphoretic.  Psychiatric: He has a normal mood and affect. His behavior is normal.    ED Course  Procedures (including critical care time)  Labs Reviewed  CBC WITH DIFFERENTIAL - Abnormal; Notable for the following:  RBC 2.80 (*)     Hemoglobin 8.8 (*)     HCT 26.7 (*)     Neutrophils Relative 83 (*)     Lymphocytes Relative 8 (*)     Lymphs Abs 0.5 (*)     All other components within normal limits  COMPREHENSIVE METABOLIC PANEL - Abnormal; Notable for the following:    Sodium 134 (*)     Glucose, Bld 150 (*)     Albumin 2.4 (*)     GFR calc non Af Amer 63 (*)     GFR calc Af Amer 73 (*)     All other components within normal limits  PROTIME-INR - Abnormal; Notable for the following:    Prothrombin Time 20.2 (*)     INR 1.69 (*)     All other components within normal limits  URINALYSIS, ROUTINE W REFLEX MICROSCOPIC  URINE CULTURE   Ct Abdomen Pelvis W Contrast  01/05/2012  *RADIOLOGY REPORT*  Clinical Data: Rectal cancer, pelvic abscess, now with left-sided pain, nausea/vomiting  CT ABDOMEN AND PELVIS WITH CONTRAST  Technique:  Multidetector CT imaging of the abdomen and pelvis was performed following the standard protocol during bolus administration of intravenous contrast.  Contrast: OMNIPAQUE IOHEXOL 300 MG/ML  SOLN  Comparison: 12/27/2011  Findings: Small bilateral pleural effusions, decreased.  Numerous hypoenhancing hepatic lesions measuring up to 8 mm, unchanged.  Spleen, pancreas, and adrenal glands are within normal limits.  Gallbladder is underdistended.  No intrahepatic or extrahepatic ductal  dilatation.  1.4 cm posterior interpolar left renal cyst.  Mild left hydronephrosis, unchanged.  Right kidney is unremarkable.  No evidence of bowel obstruction.  Again seen is debris within distal small bowel (series 2/images 52 and 57), suggesting some degree of small bowel stasis.  Normal appendix.  Left lower quadrant colostomy with stable fat-containing parastomal hernia. Extensive colonic diverticulosis, without associated inflammatory changes.  Stable rectal mass.  Stable right perirectal nodal metastasis (series 2/image 64).  Left pelvic pigtail drainage catheter.  No associated residual fluid collection/abscess.  1.4 x 2.4 cm fluid collection anterior to the rectum, stable versus minimally decreased (series 2/image 70).  Bilobed infrarenal abdominal aortic aneurysm measuring up to 3.9 cm, unchanged.  Prostate is unremarkable.  Bladder is thick-walled and/or notable for layering debris (series 2/image 65).  Degenerative changes of the visualized thoracolumbar spine.  IMPRESSION: Left pelvic drain without residual drainable fluid collection/abscess.  Additional 1.4 x 2.4 cm anterior perirectal fluid collection, stable versus minimally decreased.  Stable rectal tumor with adjacent perirectal nodal metastases, unchanged.  Bladder is mildly thick-walled and/or notable for layering debris, consider cystoscopic correlation.  Small bilateral pleural effusions, decreased.   Original Report Authenticated By: Charline Bills, M.D.      10:31 AM Discussed patient with Dr Ethelda Chick who will also see patient.    DVT study is negative.   1:39 PM Patient reports he is feeling better, pain is relieved, states he wants to go home.  Notes that when he stood up recently to urinate, his had bloody mucus discharge from his rectum.  Rectal exam shows very small amount of blood (two spots) on glove.  Rectum is tender.    Discussed results and plan with Dr Ethelda Chick.  Patient's hgb is trending downward - noted two  different Hgb levels, thought to be lab error.  Pt did go to the lab for standing blood work prior to coming to ED.    2:09 PM I discussed patient with Brayton El, PA-C,  Interventional Radiology, who will review patient with interventional radiologist and return my call.    2:25 PM Discussed pt with Dr Caron Presume, oncology, who is aware of patient, will ensure close follow up with Dr Clelia Croft.  Per epic notes, pt has an appointment with Dr Clelia Croft tomorrow.  2:44 PM Brayton El, PA-C, has reviewed CT with attending, is in ED and has removed patient's JP drain.    3:24 PM Pt continues to request discharge home.  I have discussed return precautions with him.  VSS.  Filed Vitals:   01/05/12 1500  BP: 134/59  Pulse: 68  Temp:   Resp: 18       1. Abscess of male pelvis   2. Weakness   3. Anemia       MDM  Pt with hx rectal cancer with nodal mets, recently admitted to hospital for blood loss anemia from this rectal mass, also with pelvic abscess p/w pain associated with JP drain and left leg pain, questionable swelling.  Pt has not been doing the prescribed drain flushes and measuring output as described in d/c summary.  Patient's workup here remarkable for anemia.  Pt does have minor rectal bleeding but is not symptomatic from the anemia at this time.  Discussed patient with IR who chose to remove drain in ED.  Pt requested D/C home.  Pt has follow up tomorrow with Dr Clelia Croft.  Discussed all results with patient.  Pt given return precautions.  Pt verbalizes understanding and agrees with plan.           [place], [place] 01/05/12 1547

## 2012-01-06 ENCOUNTER — Ambulatory Visit (HOSPITAL_BASED_OUTPATIENT_CLINIC_OR_DEPARTMENT_OTHER): Payer: Medicare Other | Admitting: Oncology

## 2012-01-06 ENCOUNTER — Ambulatory Visit (HOSPITAL_COMMUNITY): Payer: Medicare Other

## 2012-01-06 ENCOUNTER — Ambulatory Visit: Payer: Medicare Other | Admitting: Oncology

## 2012-01-06 ENCOUNTER — Ambulatory Visit: Payer: Medicare Other | Admitting: Lab

## 2012-01-06 ENCOUNTER — Other Ambulatory Visit: Payer: Medicare Other | Admitting: Lab

## 2012-01-06 VITALS — BP 109/57 | HR 74 | Temp 97.0°F | Resp 20 | Ht 71.0 in | Wt 196.3 lb

## 2012-01-06 DIAGNOSIS — I4891 Unspecified atrial fibrillation: Secondary | ICD-10-CM

## 2012-01-06 DIAGNOSIS — C775 Secondary and unspecified malignant neoplasm of intrapelvic lymph nodes: Secondary | ICD-10-CM

## 2012-01-06 DIAGNOSIS — C20 Malignant neoplasm of rectum: Secondary | ICD-10-CM

## 2012-01-06 DIAGNOSIS — Z7901 Long term (current) use of anticoagulants: Secondary | ICD-10-CM

## 2012-01-06 DIAGNOSIS — R232 Flushing: Secondary | ICD-10-CM

## 2012-01-06 MED ORDER — ONDANSETRON HCL 8 MG PO TABS: 8.0000 mg | ORAL_TABLET | Freq: Three times a day (TID) | ORAL | Status: AC | PRN

## 2012-01-06 MED ORDER — LOPERAMIDE HCL 2 MG PO CAPS: 2.0000 mg | ORAL_CAPSULE | Freq: Four times a day (QID) | ORAL | Status: AC | PRN

## 2012-01-06 NOTE — Progress Notes (Signed)
Hematology and Oncology Follow Up Visit  Vincent [name] [id number] 06/02/1932 78 y.o. 01/06/2012 9:21 AM  CC: Velora Heckler, MD  Tracey Harries, M.D.  Iva Boop, MD,FACG  Billie Lade, Ph.D., M.D.    Principle Diagnosis: This is a 78 year old gentleman diagnosed with adenocarcinoma of the rectum.  He had T4a N2 disease diagnosed in 2011.  He presented obstruction of the distal rectal area. Now has local relapse.    Prior Therapy: 1. Status post rectosigmoid resection and segmental resection on February 13, 2010.  Tumor was poorly differentiated adenocarcinoma, 8 out of 16 lymph nodes involved, tumor not completely resected at that time.  2. The patient received radiation therapy adamantly concomitantly with Xeloda.  Therapy concluded in January 2012.  3. Treated with adjuvant FOLFOX for a total of 7 cycles of therapy.  The therapy concluded Sep 16, 2010.  Current therapy: Set to start chemotherapy for salvage purposes.   Interim History:  Vincent [name] presents today for an office followup visit. He had prolonged hospitalizations for abdominal pain and found to have relpased disease with pelvic lymph nodes. He also had an abscesses that was drained successfully.  Overall he reports that he is doing quite well.  He still continues to have some fatigue, however, he is able to go in and work at times without difficulty.  He is not reporting any worsening peripheral neuropathy.  He is not reporting any major complications.  He is not reporting any nausea, vomiting, diarrhea, he is reporting constipation, but no abdominal pain or distention.  Overall his performance status and activity level are improving.  He denies any headaches, any visual changes, any dysphagia, odynophagia, any nausea, vomiting, diarrhea, constipation, chest pain, shortness of breath, productive cough, abdominal pain, abdominal swelling, lower extremity paresthesia, bowel or bladder incontinence, any obvious bleeding such as  melena, hematochezia, or hematuria.  He denies any fevers, chills, or night sweats, any type of weight loss or anorexia, any palpable lymph node swelling.  Overall reports he has a good appetite and is eating and drinking quite well.  Medications: I have reviewed the patient's current medications. Current outpatient prescriptions:albuterol-ipratropium (COMBIVENT) 18-103 MCG/ACT inhaler, Inhale 1 puff into the lungs every 4 (four) hours as needed. WHEEZING, Disp: , Rfl: ;  ciprofloxacin (CIPRO) 500 MG tablet, Take 1 tablet (500 mg total) by mouth 2 (two) times daily., Disp: 28 tablet, Rfl: 0;  digoxin (LANOXIN) 0.125 MG tablet, Take 1 tablet (125 mcg total) by mouth daily., Disp: 30 tablet, Rfl: 0 diltiazem (CARDIZEM) 30 MG tablet, Take 1 tablet (30 mg total) by mouth every 8 (eight) hours., Disp: 90 tablet, Rfl: 0;  ferrous sulfate 325 (65 FE) MG tablet, Take 1 tablet (325 mg total) by mouth daily with breakfast., Disp: 30 tablet, Rfl: 0;  Fluticasone-Salmeterol (ADVAIR DISKUS) 500-50 MCG/DOSE AEPB, Inhale 1 puff into the lungs every 12 (twelve) hours., Disp: 60 each, Rfl: 3 HYDROmorphone (DILAUDID) 4 MG tablet, Take 1 tablet (4 mg total) by mouth every 4 (four) hours as needed for pain., Disp: 60 tablet, Rfl: 0;  metFORMIN (GLUCOPHAGE) 500 MG tablet, , Disp: , Rfl: ;  oxyCODONE (OXY IR/ROXICODONE) 5 MG immediate release tablet, Take 1 tablet (5 mg total) by mouth every 4 (four) hours as needed., Disp: 45 tablet, Rfl: 0 pantoprazole (PROTONIX) 40 MG tablet, Take 1 tablet (40 mg total) by mouth daily at 12 noon., Disp: 30 tablet, Rfl: 0;  simvastatin (ZOCOR) 40 MG tablet, Take 1 tablet (40 mg  total) by mouth at bedtime., Disp: 30 tablet, Rfl: 0;  warfarin (COUMADIN) 5 MG tablet, Take 0.5 tablets (2.5 mg total) by mouth daily. Pt takes 5 mg every day except on Monday and Friday pt takes 2.5mg  ( 1/2 tab of 5 mg tablet), Disp: 30 tablet, Rfl: 0 zolpidem (AMBIEN) 5 MG tablet, Take 1 tablet (5 mg total) by mouth at  bedtime as needed for sleep (insomnia)., Disp: 30 tablet, Rfl: 0;  loperamide (IMODIUM) 2 MG capsule, Take 1 capsule (2 mg total) by mouth 4 (four) times daily as needed for diarrhea or loose stools., Disp: 30 capsule, Rfl: 1;  ondansetron (ZOFRAN) 8 MG tablet, Take 1 tablet (8 mg total) by mouth every 8 (eight) hours as needed for nausea., Disp: 20 tablet, Rfl: 1 No current facility-administered medications for this visit. Facility-Administered Medications Ordered in Other Visits: iohexol (OMNIPAQUE) 300 MG/ML solution 100 mL, 100 mL, Intravenous, Once PRN, Medication Radiologist, MD, 100 mL at 01/05/12 1226  Allergies: No Known Allergies  Past Medical History, Surgical history, Social history, and Family History were reviewed and updated.  Review of Systems: Constitutional:  Negative for fever, chills, night sweats, anorexia, weight loss, pain. Cardiovascular: no chest pain or dyspnea on exertion Respiratory: no cough, shortness of breath, or wheezing Neurological: no TIA or stroke symptoms Dermatological: negative ENT: negative Skin: Negative. Gastrointestinal: no abdominal pain, change in bowel habits, or black or bloody stools Genito-Urinary: no dysuria, trouble voiding, or hematuria Hematological and Lymphatic: negative Breast: negative Musculoskeletal: negative Remaining ROS negative. Physical Exam: Blood pressure 109/57, pulse 74, temperature 97 F (36.1 C), temperature source Oral, resp. rate 20, height 5\' 11"  (1.803 m), weight 196 lb 4.8 oz (89.041 kg). ECOG: 1 General appearance: alert Head: Normocephalic, without obvious abnormality, atraumatic Neck: no adenopathy, no carotid bruit, no JVD, supple, symmetrical, trachea midline and thyroid not enlarged, symmetric, no tenderness/mass/nodules Lymph nodes: Cervical, supraclavicular, and axillary nodes normal. Heart:regular rate and rhythm, S1, S2 normal, no murmur, click, rub or gallop Lung:chest clear, no wheezing, rales,  normal symmetric air entry Abdomin: soft, non-tender, without masses or organomegaly EXT:no erythema, induration, or nodules   Lab Results: Lab Results  Component Value Date   WBC 6.2 01/05/2012   HGB 8.8* 01/05/2012   HCT 26.7* 01/05/2012   MCV 95.4 01/05/2012   PLT 222 01/05/2012    CT ABDOMEN AND PELVIS WITH CONTRAST  Technique: Multidetector CT imaging of the abdomen and pelvis was  performed following the standard protocol during bolus  administration of intravenous contrast.  Contrast: OMNIPAQUE IOHEXOL 300 MG/ML SOLN  Comparison: 12/27/2011  Findings: Small bilateral pleural effusions, decreased.  Numerous hypoenhancing hepatic lesions measuring up to 8 mm,  unchanged.  Spleen, pancreas, and adrenal glands are within normal limits.  Gallbladder is underdistended. No intrahepatic or extrahepatic  ductal dilatation.  1.4 cm posterior interpolar left renal cyst. Mild left  hydronephrosis, unchanged.  Right kidney is unremarkable.  No evidence of bowel obstruction. Again seen is debris within  distal small bowel (series 2/images 52 and 57), suggesting some  degree of small bowel stasis. Normal appendix. Left lower  quadrant colostomy with stable fat-containing parastomal hernia.  Extensive colonic diverticulosis, without associated inflammatory  changes.  Stable rectal mass. Stable right perirectal nodal metastasis  (series 2/image 64).  Left pelvic pigtail drainage catheter. No associated residual  fluid collection/abscess.  1.4 x 2.4 cm fluid collection anterior to the rectum, stable versus  minimally decreased (series 2/image 70).  Bilobed infrarenal abdominal aortic  aneurysm measuring up to 3.9  cm, unchanged.  Prostate is unremarkable.  Bladder is thick-walled and/or notable for layering debris (series  2/image 65).  Degenerative changes of the visualized thoracolumbar spine.  IMPRESSION:  Left pelvic drain without residual drainable fluid  collection/abscess.  Additional 1.4 x 2.4 cm anterior perirectal  fluid collection, stable versus minimally decreased.  Stable rectal tumor with adjacent perirectal nodal metastases,  unchanged.  Bladder is mildly thick-walled and/or notable for layering debris,  consider cystoscopic correlation.  Small bilateral pleural effusions, decreased.     Impression and Plan: This is a pleasant 78 year old gentleman with the following issues:   1. Advanced rectal carcinoma, presented with a T4 N2 disease.  He presented with a complete obstruction.  After segmental resection, he underwent radiation therapy with Xeloda and subsequently systemic chemotherapy.  At this time, he has  evidence of  recurrent disease with pelvic lymph nodes. I discussed the risks and benefits of salvage chemotherapy in the form of FOLFIRI. He is agreeable to proceed. Complications include myelosuppression, diarrhea, and infusion related toxicities. I will give him 2 months of chemotherapy and repeat CT scan at that time.  2. Port-A-Cath management. This will be used for chemotherapy.   3. Atrial fibrillation.  He is currently anticoagulated on Coumadin.  4. Hypertension.  This is stable.  5. Followup: with chemotherapy every two weeks.    [facility], MD 9/4/20139:21 AM

## 2012-01-07 LAB — URINE CULTURE: Colony Count: 2000

## 2012-01-10 ENCOUNTER — Inpatient Hospital Stay (HOSPITAL_COMMUNITY)
Admission: EM | Admit: 2012-01-10 | Discharge: 2012-01-16 | DRG: 389 | Disposition: A | Discharge status: Home / Self Care | Payer: Medicare Other | Attending: Internal Medicine | Admitting: Internal Medicine

## 2012-01-10 ENCOUNTER — Emergency Department (HOSPITAL_COMMUNITY): Payer: Medicare Other

## 2012-01-10 ENCOUNTER — Encounter (HOSPITAL_COMMUNITY): Payer: Self-pay

## 2012-01-10 DIAGNOSIS — Z860100 Personal history of colon polyps, unspecified: Secondary | ICD-10-CM

## 2012-01-10 DIAGNOSIS — K520 Gastroenteritis and colitis due to radiation: Secondary | ICD-10-CM | POA: Diagnosis present

## 2012-01-10 DIAGNOSIS — R5383 Other fatigue: Secondary | ICD-10-CM | POA: Diagnosis present

## 2012-01-10 DIAGNOSIS — M129 Arthropathy, unspecified: Secondary | ICD-10-CM | POA: Diagnosis present

## 2012-01-10 DIAGNOSIS — D6832 Hemorrhagic disorder due to extrinsic circulating anticoagulants: Secondary | ICD-10-CM | POA: Diagnosis present

## 2012-01-10 DIAGNOSIS — I4891 Unspecified atrial fibrillation: Secondary | ICD-10-CM | POA: Diagnosis present

## 2012-01-10 DIAGNOSIS — E119 Type 2 diabetes mellitus without complications: Secondary | ICD-10-CM | POA: Diagnosis present

## 2012-01-10 DIAGNOSIS — Z85038 Personal history of other malignant neoplasm of large intestine: Secondary | ICD-10-CM

## 2012-01-10 DIAGNOSIS — C20 Malignant neoplasm of rectum: Secondary | ICD-10-CM | POA: Diagnosis present

## 2012-01-10 DIAGNOSIS — R338 Other retention of urine: Secondary | ICD-10-CM | POA: Diagnosis not present

## 2012-01-10 DIAGNOSIS — Y842 Radiological procedure and radiotherapy as the cause of abnormal reaction of the patient, or of later complication, without mention of misadventure at the time of the procedure: Secondary | ICD-10-CM | POA: Diagnosis present

## 2012-01-10 DIAGNOSIS — E871 Hypo-osmolality and hyponatremia: Secondary | ICD-10-CM | POA: Diagnosis present

## 2012-01-10 DIAGNOSIS — R109 Unspecified abdominal pain: Secondary | ICD-10-CM | POA: Diagnosis present

## 2012-01-10 DIAGNOSIS — R531 Weakness: Secondary | ICD-10-CM

## 2012-01-10 DIAGNOSIS — Z7901 Long term (current) use of anticoagulants: Secondary | ICD-10-CM

## 2012-01-10 DIAGNOSIS — Z79899 Other long term (current) drug therapy: Secondary | ICD-10-CM

## 2012-01-10 DIAGNOSIS — I10 Essential (primary) hypertension: Secondary | ICD-10-CM | POA: Diagnosis present

## 2012-01-10 DIAGNOSIS — Z933 Colostomy status: Secondary | ICD-10-CM

## 2012-01-10 DIAGNOSIS — R0789 Other chest pain: Secondary | ICD-10-CM | POA: Diagnosis not present

## 2012-01-10 DIAGNOSIS — Z7902 Long term (current) use of antithrombotics/antiplatelets: Secondary | ICD-10-CM

## 2012-01-10 DIAGNOSIS — R5381 Other malaise: Secondary | ICD-10-CM | POA: Diagnosis present

## 2012-01-10 DIAGNOSIS — D696 Thrombocytopenia, unspecified: Secondary | ICD-10-CM

## 2012-01-10 DIAGNOSIS — R079 Chest pain, unspecified: Secondary | ICD-10-CM | POA: Diagnosis not present

## 2012-01-10 DIAGNOSIS — K5669 Other intestinal obstruction: Principal | ICD-10-CM | POA: Diagnosis present

## 2012-01-10 DIAGNOSIS — D649 Anemia, unspecified: Secondary | ICD-10-CM

## 2012-01-10 DIAGNOSIS — Z9049 Acquired absence of other specified parts of digestive tract: Secondary | ICD-10-CM

## 2012-01-10 DIAGNOSIS — K651 Peritoneal abscess: Secondary | ICD-10-CM

## 2012-01-10 DIAGNOSIS — D630 Anemia in neoplastic disease: Secondary | ICD-10-CM | POA: Diagnosis present

## 2012-01-10 DIAGNOSIS — K56609 Unspecified intestinal obstruction, unspecified as to partial versus complete obstruction: Secondary | ICD-10-CM

## 2012-01-10 DIAGNOSIS — Z87891 Personal history of nicotine dependence: Secondary | ICD-10-CM

## 2012-01-10 DIAGNOSIS — I480 Paroxysmal atrial fibrillation: Secondary | ICD-10-CM

## 2012-01-10 DIAGNOSIS — D62 Acute posthemorrhagic anemia: Secondary | ICD-10-CM

## 2012-01-10 DIAGNOSIS — R339 Retention of urine, unspecified: Secondary | ICD-10-CM | POA: Diagnosis not present

## 2012-01-10 LAB — CBC WITH DIFFERENTIAL/PLATELET
Basophils Absolute: 0.1 10*3/uL (ref 0.0–0.1)
Basophils Relative: 0 % (ref 0–1)
Eosinophils Absolute: 0 10*3/uL (ref 0.0–0.7)
Eosinophils Relative: 0 % (ref 0–5)
MCH: 32 pg (ref 26.0–34.0)
MCV: 93.9 fL (ref 78.0–100.0)
Platelets: 381 10*3/uL (ref 150–400)
RDW: 15.5 % (ref 11.5–15.5)

## 2012-01-10 LAB — COMPREHENSIVE METABOLIC PANEL
ALT: 16 U/L (ref 0–53)
Alkaline Phosphatase: 98 U/L (ref 39–117)
BUN: 10 mg/dL (ref 6–23)
CO2: 28 mEq/L (ref 19–32)
Calcium: 10 mg/dL (ref 8.4–10.5)
GFR calc Af Amer: 68 mL/min — ABNORMAL LOW (ref >90)
GFR calc non Af Amer: 59 mL/min — ABNORMAL LOW (ref >90)
Glucose, Bld: 201 mg/dL — ABNORMAL HIGH (ref 70–99)
Sodium: 132 mEq/L — ABNORMAL LOW (ref 135–145)

## 2012-01-10 LAB — URINALYSIS, ROUTINE W REFLEX MICROSCOPIC
Hgb urine dipstick: NEGATIVE
Nitrite: NEGATIVE
Protein, ur: 100 mg/dL — AB
Urobilinogen, UA: 1 mg/dL (ref 0.0–1.0)

## 2012-01-10 LAB — DIGOXIN LEVEL: Digoxin Level: 0.8 ng/mL (ref 0.8–2.0)

## 2012-01-10 LAB — URINE MICROSCOPIC-ADD ON

## 2012-01-10 LAB — LIPASE, BLOOD: Lipase: 12 U/L (ref 11–59)

## 2012-01-10 MED ORDER — MORPHINE SULFATE 4 MG/ML IJ SOLN
4.0000 mg | INTRAMUSCULAR | Status: AC | PRN
  Administered 2012-01-10 – 2012-01-11 (×3): 4 mg via INTRAVENOUS
  Filled 2012-01-10 (×3): qty 1

## 2012-01-10 MED ORDER — ONDANSETRON HCL 4 MG/2ML IJ SOLN
4.0000 mg | Freq: Once | INTRAMUSCULAR | Status: AC
  Administered 2012-01-10: 4 mg via INTRAVENOUS
  Filled 2012-01-10: qty 2

## 2012-01-10 MED ORDER — SODIUM CHLORIDE 0.9 % IV SOLN
1000.0000 mL | INTRAVENOUS | Status: DC
  Administered 2012-01-10: 1000 mL via INTRAVENOUS

## 2012-01-10 NOTE — ED Notes (Signed)
Pt states he has vomited x 2 today and he has had no output from his colostomy today

## 2012-01-10 NOTE — ED Provider Notes (Signed)
History     CSN: [id number]  Arrival date & time 01/10/12  2109   First MD Initiated Contact with Patient 01/10/12 2145      Chief Complaint  Patient presents with  . Abdominal Pain    HPI Patient presents to the emergency room with complaints of worsening severe upper abdominal pain.  Patient states the pain has been in his epigastric region. It does not radiate. The patient has been having nausea and vomiting without diarrhea. He does have history of colon cancer with prior partial colectomy. Patient has an ostomy bag and states that he has been having normal stool output. He has had some difficulty urinating. Denies any back pain, chest pain or shortness of breath. Nothing seems to be helping the pain he does take oral oxycodone at home Past Medical History  Diagnosis Date  . Hypertension   . Arthritis   . Heart disease   . SOB (shortness of breath)   . Malignant neoplasm of hepatic flexure   . Malignant neoplasm of rectosigmoid junction   . Cancer     and/or colon polyp  . Malignant neoplasm of hepatic flexure   . Malignant neoplasm of rectosigmoid junction   . Diabetes mellitus   . Atrial fibrillation     Past Surgical History  Procedure Date  . Back surgery [phone]  . Arthroscopic knee 1992  . 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 Exploratory laparotomy 02/2010  . Rectosigmoid colon resection 02/2010  . Colostomy 02/2010  . Drainage of intra-abdominal abscess 02/2010  . Flexible sigmoidoscopy 12/18/2011    Procedure: FLEXIBLE SIGMOIDOSCOPY;  Surgeon: Beverley Fiedler, MD;  Location: [facility];  Service: Gastroenterology;  Laterality: N/A;    No family history on file.  History  Substance Use Topics  . Smoking status: Never Smoker   . Smokeless tobacco: Never Used  . Alcohol Use: No      Review of Systems  All other systems reviewed and are negative.    Allergies  Review of patient's allergies indicates no known allergies.  Home Medications    Current Outpatient Rx  Name Route Sig Dispense Refill  . IPRATROPIUM-ALBUTEROL 18-103 MCG/ACT IN AERO Inhalation Inhale 1 puff into the lungs every 4 (four) hours as needed. WHEEZING    . DIGOXIN 0.125 MG PO TABS Oral Take 1 tablet (125 mcg total) by mouth daily. 30 tablet 0  . DILTIAZEM HCL 30 MG PO TABS Oral Take 1 tablet (30 mg total) by mouth every 8 (eight) hours. 90 tablet 0  . FERROUS SULFATE 325 (65 FE) MG PO TABS Oral Take 1 tablet (325 mg total) by mouth daily with breakfast. 30 tablet 0  . FLUTICASONE-SALMETEROL 500-50 MCG/DOSE IN AEPB Inhalation Inhale 1 puff into the lungs every 12 (twelve) hours. 60 each 3  . HYDROMORPHONE HCL 4 MG PO TABS Oral Take 4 mg by mouth every 6 (six) hours as needed. Pain    . LOPERAMIDE HCL 2 MG PO CAPS Oral Take 1 capsule (2 mg total) by mouth 4 (four) times daily as needed for diarrhea or loose stools. 30 capsule 1  . METFORMIN HCL 500 MG PO TABS Oral Take 500 mg by mouth daily with breakfast.     . ONDANSETRON HCL 8 MG PO TABS Oral Take 1 tablet (8 mg total) by mouth every 8 (eight) hours as needed for nausea. 20 tablet 1  . OXYCODONE HCL 5 MG PO TABS Oral Take 5 mg by mouth every 6 (six)  hours as needed. Breakthrough pain    . PANTOPRAZOLE SODIUM 40 MG PO TBEC Oral Take 1 tablet (40 mg total) by mouth daily at 12 noon. 30 tablet 0  . SIMVASTATIN 40 MG PO TABS Oral Take 1 tablet (40 mg total) by mouth at bedtime. 30 tablet 0  . WARFARIN SODIUM 5 MG PO TABS Oral Take 0.5 tablets (2.5 mg total) by mouth daily. Pt takes 5 mg every day except on Monday and Friday pt takes 2.5mg  ( 1/2 tab of 5 mg tablet) 30 tablet 0  . ZOLPIDEM TARTRATE 5 MG PO TABS Oral Take 1 tablet (5 mg total) by mouth at bedtime as needed for sleep (insomnia). 30 tablet 0    BP 140/72  Pulse 99  Temp 98 F (36.7 C) (Oral)  Resp 22  Ht 5\' 11"  (1.803 m)  Wt 196 lb (88.905 kg)  BMI 27.34 kg/m2  SpO2 98%  Physical Exam  Nursing note and vitals reviewed. Constitutional: He  appears well-developed and well-nourished. No distress.  HENT:  Head: Normocephalic and atraumatic.  Right Ear: External ear normal.  Left Ear: External ear normal.  Eyes: Conjunctivae are normal. Right eye exhibits no discharge. Left eye exhibits no discharge. No scleral icterus.  Neck: Neck supple. No tracheal deviation present.  Cardiovascular: Normal rate, regular rhythm and intact distal pulses.   Pulmonary/Chest: Effort normal and breath sounds normal. No stridor. No respiratory distress. He has no wheezes. He has no rales.  Abdominal: Soft. Bowel sounds are normal. He exhibits no distension, no abdominal bruit and no mass. There is tenderness in the epigastric area. There is no rebound and no guarding. No hernia.    Musculoskeletal: He exhibits no edema and no tenderness.  Neurological: He is alert. He has normal strength. No sensory deficit. Cranial nerve deficit:  no gross defecits noted. He exhibits normal muscle tone. He displays no seizure activity. Coordination normal.  Skin: Skin is warm and dry. No rash noted.  Psychiatric: He has a normal mood and affect.    ED Course  Procedures (including critical care time)  Rate: 93  Rhythm: normal sinus rhythm with premature ventricular contractions  QRS Axis: Right  Intervals: normal  ST/T Wave abnormalities: normal  Conduction Disutrbances:none  Narrative Interpretation: Nonspecific T wave changes  Old EKG Reviewed: Atrial fibrillation resolved since prior EKG  Labs Reviewed  COMPREHENSIVE METABOLIC PANEL - Abnormal; Notable for the following:    Sodium 132 (*)     Chloride 92 (*)     Glucose, Bld 201 (*)     Albumin 3.3 (*)     GFR calc non Af Amer 59 (*)     GFR calc Af Amer 68 (*)     All other components within normal limits  URINALYSIS, ROUTINE W REFLEX MICROSCOPIC - Abnormal; Notable for the following:    Color, Urine AMBER (*)  BIOCHEMICALS MAY BE AFFECTED BY COLOR   APPearance CLOUDY (*)     Bilirubin Urine  SMALL (*)     Protein, ur 100 (*)     All other components within normal limits  CBC WITH DIFFERENTIAL - Abnormal; Notable for the following:    WBC 13.8 (*)     RBC 3.91 (*)     Hemoglobin 12.5 (*)     HCT 36.7 (*)     Neutrophils Relative 90 (*)     Neutro Abs 12.4 (*)     Lymphocytes Relative 3 (*)     Lymphs Abs  0.4 (*)     All other components within normal limits  PROTIME-INR - Abnormal; Notable for the following:    Prothrombin Time 20.0 (*)     INR 1.67 (*)     All other components within normal limits  URINE MICROSCOPIC-ADD ON - Abnormal; Notable for the following:    Squamous Epithelial / LPF FEW (*)     Casts HYALINE CASTS (*)  GRANULAR CAST   All other components within normal limits  LIPASE, BLOOD  DIGOXIN LEVEL   Dg Abd Acute W/chest  01/10/2012  *RADIOLOGY REPORT*  Clinical Data: Abdominal pain.  Rectal cancer and pelvic abscess.  ACUTE ABDOMEN SERIES (ABDOMEN 2 VIEW & CHEST 1 VIEW)  Comparison: Abdomen and pelvis CT dated 01/05/2012. Chest CT dated 03/2012.  Portable chest dated 12/20/2011.  Findings: Left subclavian porta catheter tip in the superior vena cava.  Normal sized heart.  Left nipple shadow, confirmed on the CT dated 12/13/2011. The lungs are mildly hyperexpanded with mild peribronchial thickening and minimally prominent interstitial markings.  CT oral contrast in the colon.  The colon is normal in caliber. Mildly dilated small bowel loops.  Air-fluid levels in the small bowel and stomach.  A small amount of air medial to the gastric air bubble is most likely in a medial recess of the stomach.  No free peritoneal air beneath the hemidiaphragms.  Lumbar spine degenerative changes and mild scoliosis.  IMPRESSION:  1.  Interval small bowel ileus or partial obstruction. 2.  Mild changes of COPD and chronic bronchitis.   Original Report Authenticated By: Darrol Angel, M.D.      MDM  Pt with complex medical/surgical history.  Prior surgeries and rectal CA with  abscess complication.  Xray shows possible ileus vs SBO.  Will ct to evaluate further.  Discussed case with Dr Read Drivers who will follow up on the CT findings.        Celene Kras, MD 01/11/12 [phone]

## 2012-01-10 NOTE — ED Notes (Signed)
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

## 2012-01-10 NOTE — ED Notes (Signed)
MD at bedside. 

## 2012-01-11 ENCOUNTER — Encounter (HOSPITAL_COMMUNITY): Payer: Self-pay | Admitting: Oncology

## 2012-01-11 ENCOUNTER — Emergency Department (HOSPITAL_COMMUNITY): Payer: Medicare Other

## 2012-01-11 DIAGNOSIS — K56609 Unspecified intestinal obstruction, unspecified as to partial versus complete obstruction: Secondary | ICD-10-CM

## 2012-01-11 DIAGNOSIS — C20 Malignant neoplasm of rectum: Secondary | ICD-10-CM

## 2012-01-11 DIAGNOSIS — R5381 Other malaise: Secondary | ICD-10-CM

## 2012-01-11 DIAGNOSIS — R109 Unspecified abdominal pain: Secondary | ICD-10-CM

## 2012-01-11 DIAGNOSIS — E871 Hypo-osmolality and hyponatremia: Secondary | ICD-10-CM

## 2012-01-11 LAB — CBC
MCH: 30.5 pg (ref 26.0–34.0)
Platelets: 289 10*3/uL (ref 150–400)
RBC: 3.18 MIL/uL — ABNORMAL LOW (ref 4.22–5.81)
RDW: 15.5 % (ref 11.5–15.5)
WBC: 6.7 10*3/uL (ref 4.0–10.5)

## 2012-01-11 LAB — GLUCOSE, CAPILLARY
Glucose-Capillary: 114 mg/dL — ABNORMAL HIGH (ref 70–99)
Glucose-Capillary: 96 mg/dL (ref 70–99)

## 2012-01-11 LAB — BASIC METABOLIC PANEL
Calcium: 8.7 mg/dL (ref 8.4–10.5)
Creatinine, Ser: 1.18 mg/dL (ref 0.50–1.35)
GFR calc non Af Amer: 58 mL/min — ABNORMAL LOW (ref >90)
Glucose, Bld: 139 mg/dL — ABNORMAL HIGH (ref 70–99)
Sodium: 136 mEq/L (ref 135–145)

## 2012-01-11 MED ORDER — DIGOXIN 0.25 MG/ML IJ SOLN
0.1250 mg | Freq: Every day | INTRAMUSCULAR | Status: DC
  Administered 2012-01-11 – 2012-01-15 (×5): 0.125 mg via INTRAVENOUS
  Filled 2012-01-11 (×5): qty 0.5

## 2012-01-11 MED ORDER — PANTOPRAZOLE SODIUM 40 MG IV SOLR
40.0000 mg | INTRAVENOUS | Status: DC
  Administered 2012-01-11 – 2012-01-16 (×6): 40 mg via INTRAVENOUS
  Filled 2012-01-11 (×8): qty 40

## 2012-01-11 MED ORDER — ONDANSETRON HCL 4 MG PO TABS: 4.0000 mg | ORAL_TABLET | Freq: Four times a day (QID) | ORAL | Status: DC | PRN

## 2012-01-11 MED ORDER — ONDANSETRON HCL 4 MG/2ML IJ SOLN: 4.0000 mg | Freq: Four times a day (QID) | INTRAMUSCULAR | Status: DC | PRN

## 2012-01-11 MED ORDER — INSULIN ASPART 100 UNIT/ML ~~LOC~~ SOLN
0.0000 [IU] | SUBCUTANEOUS | Status: DC
  Administered 2012-01-14 – 2012-01-16 (×5): 1 [IU] via SUBCUTANEOUS

## 2012-01-11 MED ORDER — ACETAMINOPHEN 650 MG RE SUPP: 650.0000 mg | Freq: Four times a day (QID) | RECTAL | Status: DC | PRN

## 2012-01-11 MED ORDER — LIDOCAINE HCL 2 % EX GEL
CUTANEOUS | Status: AC
  Administered 2012-01-11: 10
  Filled 2012-01-11: qty 10

## 2012-01-11 MED ORDER — ACETAMINOPHEN 325 MG PO TABS
650.0000 mg | ORAL_TABLET | Freq: Four times a day (QID) | ORAL | Status: DC | PRN
  Administered 2012-01-14 – 2012-01-16 (×2): 650 mg via ORAL
  Filled 2012-01-11 (×3): qty 2

## 2012-01-11 MED ORDER — IPRATROPIUM BROMIDE 0.02 % IN SOLN: 0.5000 mg | Freq: Four times a day (QID) | RESPIRATORY_TRACT | Status: DC | PRN

## 2012-01-11 MED ORDER — HYDROMORPHONE HCL PF 1 MG/ML IJ SOLN
0.5000 mg | INTRAMUSCULAR | Status: DC | PRN
  Administered 2012-01-11 – 2012-01-13 (×14): 1 mg via INTRAVENOUS
  Filled 2012-01-11 (×15): qty 1

## 2012-01-11 MED ORDER — IOHEXOL 300 MG/ML  SOLN
100.0000 mL | Freq: Once | INTRAMUSCULAR | Status: AC | PRN
  Administered 2012-01-11: 100 mL via INTRAVENOUS

## 2012-01-11 MED ORDER — PROMETHAZINE HCL 25 MG/ML IJ SOLN
12.5000 mg | Freq: Once | INTRAMUSCULAR | Status: AC
  Administered 2012-01-11: 12.5 mg via INTRAVENOUS
  Filled 2012-01-11: qty 1

## 2012-01-11 MED ORDER — ALBUTEROL SULFATE (5 MG/ML) 0.5% IN NEBU: 2.5000 mg | INHALATION_SOLUTION | Freq: Four times a day (QID) | RESPIRATORY_TRACT | Status: DC | PRN

## 2012-01-11 MED ORDER — SODIUM CHLORIDE 0.9 % IV SOLN
INTRAVENOUS | Status: DC
  Administered 2012-01-11 – 2012-01-14 (×7): via INTRAVENOUS

## 2012-01-11 NOTE — ED Notes (Signed)
NG tube inserted without difficulty. Pt tolerated procedure well. Verified by auscultation over stomach region with sounds heard and aspiration of stomach contents verified by Orvan Falconer, RN. Return of 1000 ml brown, liquid contents. Placed on intermittent suction. Pt resting quietly at this time.

## 2012-01-11 NOTE — Care Management Note (Signed)
    Page 1 of 1   01/18/2012     9:21:17 AM   CARE MANAGEMENT NOTE 01/11/2012  Patient:  Vincent [name], Vincent [name]   Account Number:  [id number]  Date Initiated:  01/11/2012  Documentation initiated by:  Lorenda Ishihara  Subjective/Objective Assessment:   78 yo male admitted with SBO. PTA lived at home with spouse.     Action/Plan:   Anticipated DC Date:  01/15/2012   Anticipated DC Plan:  HOME/SELF CARE      DC Planning Services  CM consult      Choice offered to / List presented to:             Status of service:  In process, will continue to follow Medicare Important Message given?   (If response is "NO", the following Medicare IM given date fields will be blank) Date Medicare IM given:   Date Additional Medicare IM given:    Discharge Disposition:    Per UR Regulation:  Reviewed for med. necessity/level of care/duration of stay  If discussed at Long Length of Stay Meetings, dates discussed:    Comments:  01-11-12 Lorenda Ishihara RN CM Patient has history of HH with Genevieve Norlander from previous admission.

## 2012-01-11 NOTE — Progress Notes (Signed)
[facility] PROGRESS NOTE  Vincent [name] ZOX:[id number] DOB: Sep 25, 1932 DOA: 01/10/2012 PCP: Aura Dials, MD  Assessment/Plan: Principal Problem:  *SBO (small bowel obstruction) Active Problems:  Rectal carcinoma  Abdominal pain  Hyponatremia  Weakness generalized  1. Small bowel obstruction:? Secondary to recurrent tumor versus radiation. Continue n.p.o., NG tube to low wall suction and IV fluids. Patient has improved with resolution of abdominal pain. General surgery consulted. 2. Anemia: Secondary to chronic disease/malignancy and related therapies. Seems stable. Follow up CBC in a.m. 3. Atrial fibrillation with controlled ventricular rate. Continue IV digoxin. Hold Coumadin. INR was subtherapeutic on admission. If goes into RVR may have to transfer to telemetry and consider IV Cardizem drip versus trial of a IV scheduled metoprolol. Digoxin level: 0.9. 4. Type 2 diabetes mellitus: Hold metformin. Sliding scale insulin. Reasonably controlled inpatient. 5. Advanced rectal cancer/T4 N2 disease and supposed to start chemotherapy on 9/11. Called in left message with Dr. Alver Fisher nurse regarding patient's admission. He is out of office today. 6. Hypertension: Controlled.  Code Status: Full Family Communication: None Disposition Plan: Home when stable.   Brief narrative: 78 year old male with history of advanced rectal cancer, colostomy, A. fib on Coumadin, DM 2 and hypertension presented to the ED with worsening abdominal pain, nausea and vomiting without diarrhea. Imaging studies in the emergency department were suggestive of small bowel obstruction.  Consultants:  General surgery.  Procedures:  NG tube to low wall suction.  Antibiotics:  None  HPI/Subjective: Patient says he's feeling much better. Denies abdominal pain. No chest pain, dyspnea or palpitations.  Objective: Filed Vitals:   01/11/12 0233 01/11/12 0300 01/11/12 0430 01/11/12 1400  BP: 127/63 129/68  136/77 141/71  Pulse: 80 81 83 71  Temp:   97.9 F (36.6 C) 98.1 F (36.7 C)  TempSrc:   Oral Oral  Resp: 20 19 17 18   Height:   5\' 11"  (1.803 m)   Weight:   88 kg (194 lb 0.1 oz)   SpO2: 100% 94% 2% 98%    Intake/Output Summary (Last 24 hours) at 01/11/12 1713 Last data filed at 01/11/12 1519  Gross per 24 hour  Intake 668.75 ml  Output   1000 ml  Net -331.25 ml   Filed Weights   01/10/12 2122 01/11/12 0430  Weight: 88.905 kg (196 lb) 88 kg (194 lb 0.1 oz)    Exam:   General exam: Comfortable moderately built and nourished male patient lying supine in bed.  Respiratory system: Clear to auscultation. No increased work of breathing.  Cardiovascular system: S1 and S2 heard, irregularly irregular. No JVD, murmurs, gallops or pedal edema.  Gastrointestinal system: Abdomen is nondistended, soft and nontender. Normal bowel sounds heard. Colostomy in the left mid quadrant draining small amount of soft brown stools. Midline laparotomy scar.  Central system: Alert and oriented. No focal neurological deficit.  Extremities: Symmetric 5 x 5 power.   Data Reviewed: Basic Metabolic Panel:  Lab 01/11/12 8119 01/10/12 2200 01/05/12 1120 01/05/12 0907  NA 136 132* 134* 135*  K 4.1 3.8 3.5 3.8  CL 97 92* 97 98  CO2 31 28 29 28   GLUCOSE 139* 201* 150* 155*  BUN 10 10 13  13.0  CREATININE 1.18 1.17 1.11 1.1  CALCIUM 8.7 10.0 8.6 --  MG -- -- -- --  PHOS -- -- -- --   Liver Function Tests:  Lab 01/10/12 2200 01/05/12 1120 01/05/12 0907  AST 15 19 22   ALT 16 17 21   ALKPHOS 98 62  66  BILITOT 0.6 0.3 0.50  PROT 8.2 6.0 5.9*  ALBUMIN 3.3* 2.4* 2.5*    Lab 01/10/12 2200  LIPASE 12  AMYLASE --   No results found for this basename: AMMONIA:5 in the last 168 hours CBC:  Lab 01/11/12 0515 01/10/12 2200 01/05/12 1120 01/05/12 0907  WBC 6.7 13.8* 6.2 6.2  NEUTROABS -- 12.4* 5.1 5.1  HGB 9.7* 12.5* 8.8* 9.6*  HCT 30.3* 36.7* 26.7* 27.8*  MCV 95.3 93.9 95.4 94.7  PLT 289  381 222 201   Cardiac Enzymes: No results found for this basename: CKTOTAL:5,CKMB:5,CKMBINDEX:5,TROPONINI:5 in the last 168 hours BNP (last 3 results) No results found for this basename: PROBNP:3 in the last 8760 hours CBG:  Lab 01/11/12 1557 01/11/12 1132 01/11/12 0717 01/11/12 0449  GLUCAP 98 96 114* 143*    Recent Results (from the past 240 hour(s))  URINE CULTURE     Status: Normal   Collection Time   01/05/12 11:54 AM      Component Value Range Status Comment   Specimen Description URINE, RANDOM   Final    Special Requests NONE   Final    Culture  Setup Time 01/06/2012 00:54   Final    Colony Count 2,000 COLONIES/ML   Final    Culture INSIGNIFICANT GROWTH   Final    Report Status 01/07/2012 FINAL   Final      Studies: Ct Abdomen Pelvis W Contrast  01/11/2012  *RADIOLOGY REPORT*  Clinical Data: Severe upper abdominal pain.  Nausea and vomiting. White cell count 13.8.  History of colon cancer.  CT ABDOMEN AND PELVIS WITH CONTRAST  Technique:  Multidetector CT imaging of the abdomen and pelvis was performed following the standard protocol during bolus administration of intravenous contrast.  Contrast: OMNIPAQUE IOHEXOL 300 MG/ML  SOLN  Comparison: 01/05/2012  Findings: The lung bases are clear.  Mild pericardial effusion or thickening.  Calcification in the mitral valve annulus region.  Sub centimeter cysts in the liver appears stable since previous study.  The gallbladder is contracted.  Spleen size is normal. Adrenal glands are normal.  No retroperitoneal lymphadenopathy. Kidneys are symmetrical without hydronephrosis or solid mass. Calcification of the abdominal aorta with infrarenal abdominal aortic aneurysm measuring 4 cm diameter, stable since previous study.  The stomach is distended.  Small bowel are distended with predominately fluid.  Terminal ileum is decompressed.  Transition zone appears to be a region of thick-walled terminal ileum in the right pelvis.  Changes are  consistent with small bowel obstruction. No free air or free fluid in the abdomen.  Pelvis:  Postoperative changes with a distal colonic resection and rectal stump.  Left lower quadrant descending colostomy.  Appendix is normal.  No free or loculated pelvic fluid collections.  Diffuse bladder wall thickening suggesting hypertrophy or infection.  This is stable.  Since the previous study, a left pelvic drainage catheter has been removed.  There may be a small residual fluid collection in the left presacral space extending superiorly.  This measures about 17 mm diameter and appears stable since previous study.  IMPRESSION: Fluid distension of the small bowel with transition zone in the right pelvis representing a segment of thick-walled bowel.  Changes are consistent with small bowel obstruction.  Cause could be due to a effusions or reactive inflammatory process.  Small residual pelvic fluid collection in the region of the previous drainage catheter.  Examination is otherwise stable.   Original Report Authenticated By: Marlon Pel, M.D.  Dg Abd Acute W/chest  01/10/2012  *RADIOLOGY REPORT*  Clinical Data: Abdominal pain.  Rectal cancer and pelvic abscess.  ACUTE ABDOMEN SERIES (ABDOMEN 2 VIEW & CHEST 1 VIEW)  Comparison: Abdomen and pelvis CT dated 01/05/2012. Chest CT dated 03/2012.  Portable chest dated 12/20/2011.  Findings: Left subclavian porta catheter tip in the superior vena cava.  Normal sized heart.  Left nipple shadow, confirmed on the CT dated 12/13/2011. The lungs are mildly hyperexpanded with mild peribronchial thickening and minimally prominent interstitial markings.  CT oral contrast in the colon.  The colon is normal in caliber. Mildly dilated small bowel loops.  Air-fluid levels in the small bowel and stomach.  A small amount of air medial to the gastric air bubble is most likely in a medial recess of the stomach.  No free peritoneal air beneath the hemidiaphragms.  Lumbar spine  degenerative changes and mild scoliosis.  IMPRESSION:  1.  Interval small bowel ileus or partial obstruction. 2.  Mild changes of COPD and chronic bronchitis.   Original Report Authenticated By: Darrol Angel, M.D.     Scheduled Meds:    . digoxin  0.125 mg Intravenous Daily  . insulin aspart  0-9 Units Subcutaneous Q4H  . lidocaine      . ondansetron  4 mg Intravenous Once  . pantoprazole (PROTONIX) IV  40 mg Intravenous Q24H  . promethazine  12.5 mg Intravenous Once   Continuous Infusions:    . sodium chloride 1,000 mL (01/11/12 0440)  . sodium chloride 75 mL/hr at 01/11/12 0705      [facility]  [facility] Pager [phone]. If 8PM-8AM, please contact night-coverage at [email or web address], password [facility] 01/11/2012, 5:13 PM  LOS: 1 day

## 2012-01-11 NOTE — H&P (Signed)
[facility] History and Physical  GRANITE GODMAN WUJ:[id number] DOB: 07-14-1932 DOA: 01/10/2012  Referring physician: EDP PCP: Aura Dials, MD   Chief Complaint: ABD Pain  HPI: Vincent [name] is a 78 y.o. male who presents to the emergency room with complaints of worsening severe upper abdominal pain. Patient states the pain has been in his epigastric region. He reports having 8/10 crampy pain, the pain does not radiate. He has been having nausea and vomiting without diarrhea. He does have history of colon cancer with prior partial colectomy. Patient has an ostomy bag and states that he has been having normal stool output. He has had some difficulty urinating.  He denies any back pain, chest pain or shortness of breath. Nothing seems to be helping the pain he does take oral oxycodone at home.   He was seen in the ED and evaluated and a imaging studies, first an Acute ABD series followed by a CT scan of the ABD  and Pelvis revealed a SBO, Small Bowel Obstruction, and an NGT was placed and patient was referred for admission.     Review of Systems:  Constitutional: Negative for fever, chills and malaise/fatigue. Negative for diaphoresis.  HENT: Negative for hearing loss, ear pain, nosebleeds, congestion, sore throat, neck pain, tinnitus and ear discharge.  Eyes: Negative for blurred vision, double vision, photophobia, pain, discharge and redness.  Respiratory: Negative for cough, hemoptysis, sputum production, shortness of breath, wheezing and stridor.  Cardiovascular: Negative for chest pain, palpitations, orthopnea, claudication and leg swelling.  Gastrointestinal: Positive for  nausea, vomiting and abdominal pain. Negative for heartburn, constipation, blood in stool and melena;and for diarrhea  Genitourinary: Negative for dysuria, urgency, frequency, hematuria and flank pain.  Musculoskeletal: No Muscle Spasms and myalgias, +No Back pain, joint pain or Falls.  Skin: No Rashes or ulcers    Neurological: + Generalized Weakness, dizziness. Negative for tingling, tremors, sensory change, speech change, loss of consciousness and headaches.  Endo/Heme/Allergies: Negative for environmental allergies and polydipsia. Does not bruise/bleed easily.  Psychiatric/Behavioral: No suicidal ideas. The patient is not nervous/anxious.     Past Medical History  Diagnosis Date  . Hypertension   . Arthritis   . Heart disease   . SOB (shortness of breath)   . Malignant neoplasm of hepatic flexure   . Malignant neoplasm of rectosigmoid junction   . Cancer     and/or colon polyp  . Malignant neoplasm of hepatic flexure   . Malignant neoplasm of rectosigmoid junction   . Diabetes mellitus   . Atrial fibrillation    Past Surgical History  Procedure Date  . Back surgery [phone]  . Arthroscopic knee 1992  . 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 Exploratory laparotomy 02/2010  . Rectosigmoid colon resection 02/2010  . Colostomy 02/2010  . Drainage of intra-abdominal abscess 02/2010  . Flexible sigmoidoscopy 12/18/2011    Procedure: FLEXIBLE SIGMOIDOSCOPY;  Surgeon: Beverley Fiedler, MD;  Location: [facility];  Service: Gastroenterology;  Laterality: N/A;     Prior to Admission medications   Medication Sig Start Date End Date Taking? Authorizing Provider  albuterol-ipratropium (COMBIVENT) 18-103 MCG/ACT inhaler Inhale 1 puff into the lungs every 4 (four) hours as needed. WHEEZING 12/28/11  Yes Alison Murray, MD  digoxin (LANOXIN) 0.125 MG tablet Take 1 tablet (125 mcg total) by mouth daily. 12/28/11  Yes Alison Murray, MD  diltiazem (CARDIZEM) 30 MG tablet Take 1 tablet (30 mg total) by mouth every 8 (eight) hours. 12/28/11 12/27/12  Yes Alison Murray, MD  ferrous sulfate 325 (65 FE) MG tablet Take 1 tablet (325 mg total) by mouth daily with breakfast. 12/28/11 12/27/12 Yes Alison Murray, MD  Fluticasone-Salmeterol (ADVAIR DISKUS) 500-50 MCG/DOSE AEPB Inhale 1 puff into the lungs every 12  (twelve) hours. 12/28/11  Yes Alison Murray, MD  HYDROmorphone (DILAUDID) 4 MG tablet Take 4 mg by mouth every 6 (six) hours as needed. Pain 12/28/11  Yes Historical Provider, MD  loperamide (IMODIUM) 2 MG capsule Take 1 capsule (2 mg total) by mouth 4 (four) times daily as needed for diarrhea or loose stools. 01/06/12 01/16/12 Yes Benjiman Core, MD  metFORMIN (GLUCOPHAGE) 500 MG tablet Take 500 mg by mouth daily with breakfast.  11/12/11  Yes Historical Provider, MD  ondansetron (ZOFRAN) 8 MG tablet Take 1 tablet (8 mg total) by mouth every 8 (eight) hours as needed for nausea. 01/06/12 01/13/12 Yes Benjiman Core, MD  oxyCODONE (OXY IR/ROXICODONE) 5 MG immediate release tablet Take 5 mg by mouth every 6 (six) hours as needed. Breakthrough pain 12/28/11  Yes Historical Provider, MD  pantoprazole (PROTONIX) 40 MG tablet Take 1 tablet (40 mg total) by mouth daily at 12 noon. 12/28/11 12/27/12 Yes Alison Murray, MD  simvastatin (ZOCOR) 40 MG tablet Take 1 tablet (40 mg total) by mouth at bedtime. 12/28/11  Yes Alison Murray, MD  warfarin (COUMADIN) 5 MG tablet Take 0.5 tablets (2.5 mg total) by mouth daily. Pt takes 5 mg every day except on Monday and Friday pt takes 2.5mg  ( 1/2 tab of 5 mg tablet) 12/28/11  Yes Alison Murray, MD  zolpidem (AMBIEN) 5 MG tablet Take 1 tablet (5 mg total) by mouth at bedtime as needed for sleep (insomnia). 12/28/11 12/27/12 Yes Alison Murray, MD     No Known Allergies   Social History:  reports that he has never smoked. He has never used smokeless tobacco. He reports that he does not drink alcohol or use illicit drugs.  where does patient live--home, ALF, SNF? and with whom if at home?  Can patient participate in ADLs?  No family history on file.     Physical Exam:  GEN:  Pleasant 78 year old Elderly Caucasian Male examined  and in no acute distress; cooperative with exam Filed Vitals:   01/11/12 0030 01/11/12 0100 01/11/12 0233 01/11/12 0300  BP: 138/64 129/85 127/63  129/68  Pulse: 91 91 80 81  Temp:      TempSrc:      Resp: 16 26 20 19   Height:      Weight:      SpO2: 93% 100% 100% 94%   Blood pressure 129/68, pulse 81, temperature 98 F (36.7 C), temperature source Oral, resp. rate 19, height 5\' 11"  (1.803 m), weight 88.905 kg (196 lb), SpO2 94.00%. PSYCH: He is alert and oriented x4; does not appear anxious does not appear depressed; affect is normal HEENT: Normocephalic and Atraumatic, Mucous membranes pink; PERRLA; EOM intact; Fundi:  Benign;  No scleral icterus, Nares: Patent, Oropharynx: Clear, Fair Dentition, Neck:  FROM, no cervical lymphadenopathy nor thyromegaly or carotid bruit; no JVD; Breasts:: Not examined CHEST WALL: No tenderness CHEST: Normal respiration, clear to auscultation bilaterally HEART: Regular rate and rhythm; no murmurs rubs or gallops BACK: No kyphosis or scoliosis; no CVA tenderness ABDOMEN:  + Colostomy Bag Secure, Decreased Bowel Sounds, soft Mildly tender No Rebound No Guarding, No masses, no organomegaly.   Rectal Exam: Not done EXTREMITIES:  No bone or joint deformity; age-appropriate arthropathy of the hands and knees; no cyanosis, clubbing or edema; no ulcerations. Genitalia: not examined PULSES: 2+ and symmetric SKIN: Normal hydration no rash or ulceration CNS: Cranial nerves 2-12 grossly intact no focal neurologic deficit   Labs on Admission:  Basic Metabolic Panel:  Lab 01/10/12 4696 01/05/12 1120 01/05/12 0907  NA 132* 134* 135*  K 3.8 3.5 3.8  CL 92* 97 98  CO2 28 29 28   GLUCOSE 201* 150* 155*  BUN 10 13 13.0  CREATININE 1.17 1.11 1.1  CALCIUM 10.0 8.6 --  MG -- -- --  PHOS -- -- --   Liver Function Tests:  Lab 01/10/12 2200 01/05/12 1120 01/05/12 0907  AST 15 19 22   ALT 16 17 21   ALKPHOS 98 62 66  BILITOT 0.6 0.3 0.50  PROT 8.2 6.0 5.9*  ALBUMIN 3.3* 2.4* 2.5*    Lab 01/10/12 2200  LIPASE 12  AMYLASE --   No results found for this basename: AMMONIA:5 in the last 168  hours CBC:  Lab 01/10/12 2200 01/05/12 1120 01/05/12 0907  WBC 13.8* 6.2 6.2  NEUTROABS 12.4* 5.1 5.1  HGB 12.5* 8.8* 9.6*  HCT 36.7* 26.7* 27.8*  MCV 93.9 95.4 94.7  PLT 381 222 201   Cardiac Enzymes: No results found for this basename: CKTOTAL:5,CKMB:5,CKMBINDEX:5,TROPONINI:5 in the last 168 hours  BNP (last 3 results) No results found for this basename: PROBNP:3 in the last 8760 hours CBG: No results found for this basename: GLUCAP:5 in the last 168 hours  Radiological Exams on Admission: Ct Abdomen Pelvis W Contrast  01/11/2012  *RADIOLOGY REPORT*  Clinical Data: Severe upper abdominal pain.  Nausea and vomiting. White cell count 13.8.  History of colon cancer.  CT ABDOMEN AND PELVIS WITH CONTRAST  Technique:  Multidetector CT imaging of the abdomen and pelvis was performed following the standard protocol during bolus administration of intravenous contrast.  Contrast: OMNIPAQUE IOHEXOL 300 MG/ML  SOLN  Comparison: 01/05/2012  Findings: The lung bases are clear.  Mild pericardial effusion or thickening.  Calcification in the mitral valve annulus region.  Sub centimeter cysts in the liver appears stable since previous study.  The gallbladder is contracted.  Spleen size is normal. Adrenal glands are normal.  No retroperitoneal lymphadenopathy. Kidneys are symmetrical without hydronephrosis or solid mass. Calcification of the abdominal aorta with infrarenal abdominal aortic aneurysm measuring 4 cm diameter, stable since previous study.  The stomach is distended.  Small bowel are distended with predominately fluid.  Terminal ileum is decompressed.  Transition zone appears to be a region of thick-walled terminal ileum in the right pelvis.  Changes are consistent with small bowel obstruction. No free air or free fluid in the abdomen.  Pelvis:  Postoperative changes with a distal colonic resection and rectal stump.  Left lower quadrant descending colostomy.  Appendix is normal.  No free or  loculated pelvic fluid collections.  Diffuse bladder wall thickening suggesting hypertrophy or infection.  This is stable.  Since the previous study, a left pelvic drainage catheter has been removed.  There may be a small residual fluid collection in the left presacral space extending superiorly.  This measures about 17 mm diameter and appears stable since previous study.  IMPRESSION: Fluid distension of the small bowel with transition zone in the right pelvis representing a segment of thick-walled bowel.  Changes are consistent with small bowel obstruction.  Cause could be due to a effusions or reactive inflammatory process.  Small  residual pelvic fluid collection in the region of the previous drainage catheter.  Examination is otherwise stable.   Original Report Authenticated By: Marlon Pel, M.D.    Dg Abd Acute W/chest  01/10/2012  *RADIOLOGY REPORT*  Clinical Data: Abdominal pain.  Rectal cancer and pelvic abscess.  ACUTE ABDOMEN SERIES (ABDOMEN 2 VIEW & CHEST 1 VIEW)  Comparison: Abdomen and pelvis CT dated 01/05/2012. Chest CT dated 03/2012.  Portable chest dated 12/20/2011.  Findings: Left subclavian porta catheter tip in the superior vena cava.  Normal sized heart.  Left nipple shadow, confirmed on the CT dated 12/13/2011. The lungs are mildly hyperexpanded with mild peribronchial thickening and minimally prominent interstitial markings.  CT oral contrast in the colon.  The colon is normal in caliber. Mildly dilated small bowel loops.  Air-fluid levels in the small bowel and stomach.  A small amount of air medial to the gastric air bubble is most likely in a medial recess of the stomach.  No free peritoneal air beneath the hemidiaphragms.  Lumbar spine degenerative changes and mild scoliosis.  IMPRESSION:  1.  Interval small bowel ileus or partial obstruction. 2.  Mild changes of COPD and chronic bronchitis.   Original Report Authenticated By: Darrol Vincent, M.D.     EKG: Independently  reviewed. Sinus Rhythm, No Acute S-T Changes occasional PVC.    Assessment: Principal Problem:  *SBO (small bowel obstruction) Active Problems:  Abdominal pain  Rectal carcinoma  Hyponatremia  Weakness generalized   Plan:    Admit to Med/Surg Bed NPO, NGT to LIMS IVFs Anti-Emetics PRN Reconcile Home Meds, convert those to IV that can  SCDs for DVT prophylaxis Consult General Surgery in AM   Code Status: FULL CODE Family Communication: N/A Disposition Plan:   Return to Home  Time spent: 2 MINUTES  Ron Parker [facility] Pager [phone]  If 7PM-7AM, please contact night-coverage [email or web address] Password [email or web address] 01/11/2012, 4:04 AM

## 2012-01-11 NOTE — Consult Note (Signed)
Agree with above 

## 2012-01-11 NOTE — ED Notes (Signed)
Patient transported to CT 

## 2012-01-11 NOTE — Consult Note (Signed)
Vincent [name] 06-12-1932  [id number].   Requesting MD: Dr. Waymon Amato Chief Complaint/Reason for Consult: SBO HPI: This is a 78 yo male who has stage 3 rectal carcinoma.  He has had a LAR with a colostomy in the past.  He underwent radiation and chemotherapy in the past.  He is actually set up to undergo salvage chemotherapy starting on Wednesday.  On Friday night, he started having some vague abdominal pain.  It progressively worsened and he developed nausea and vomiting.  He states he has not had a BM in his ostomy bag since last Tuesday or Wednesday.  He presented to [facility] yesterday and was found to have a SBO secondary to a thickened segment of terminal ileum.  He was admitted and an NGT was placed.  We have been asked to see the patient.  Dr. Gerrit Friends follows this patient chronically, but is currently out of town.  Review of Systems: Please see HPI, otherwise negative  Family History  Problem Relation Age of Onset  . Cancer Father     Past Medical History  Diagnosis Date  . Hypertension   . Arthritis   . Heart disease   . SOB (shortness of breath)   . Malignant neoplasm of hepatic flexure   . Malignant neoplasm of rectosigmoid junction   . Cancer     and/or colon polyp  . Malignant neoplasm of hepatic flexure   . Malignant neoplasm of rectosigmoid junction   . Diabetes mellitus   . Atrial fibrillation     Past Surgical History  Procedure Date  . Back surgery [phone]  . Arthroscopic knee 1992  . Adenocarcinoma of the rectum [phone]  . Exploratory laparotomy 02/2010  . Rectosigmoid colon resection 02/2010  . Colostomy 02/2010  . Drainage of intra-abdominal abscess 02/2010  . Flexible sigmoidoscopy 12/18/2011    Procedure: FLEXIBLE SIGMOIDOSCOPY;  Surgeon: Beverley Fiedler, MD;  Location: [facility];  Service: Gastroenterology;  Laterality: N/A;  . Colon surgery     Social History:  reports that he quit smoking about 3 years ago. His smoking use included Cigarettes. He has a  120 pack-year smoking history. He has never used smokeless tobacco. He reports that he does not drink alcohol. His drug history not on file.  Allergies: No Known Allergies  Medications Prior to Admission  Medication Sig Dispense Refill  . albuterol-ipratropium (COMBIVENT) 18-103 MCG/ACT inhaler Inhale 1 puff into the lungs every 4 (four) hours as needed. WHEEZING      . digoxin (LANOXIN) 0.125 MG tablet Take 1 tablet (125 mcg total) by mouth daily.  30 tablet  0  . diltiazem (CARDIZEM) 30 MG tablet Take 1 tablet (30 mg total) by mouth every 8 (eight) hours.  90 tablet  0  . ferrous sulfate 325 (65 FE) MG tablet Take 1 tablet (325 mg total) by mouth daily with breakfast.  30 tablet  0  . Fluticasone-Salmeterol (ADVAIR DISKUS) 500-50 MCG/DOSE AEPB Inhale 1 puff into the lungs every 12 (twelve) hours.  60 each  3  . HYDROmorphone (DILAUDID) 4 MG tablet Take 4 mg by mouth every 6 (six) hours as needed. Pain      . loperamide (IMODIUM) 2 MG capsule Take 1 capsule (2 mg total) by mouth 4 (four) times daily as needed for diarrhea or loose stools.  30 capsule  1  . metFORMIN (GLUCOPHAGE) 500 MG tablet Take 500 mg by mouth daily with breakfast.       . ondansetron (ZOFRAN) 8 MG tablet  Take 1 tablet (8 mg total) by mouth every 8 (eight) hours as needed for nausea.  20 tablet  1  . oxyCODONE (OXY IR/ROXICODONE) 5 MG immediate release tablet Take 5 mg by mouth every 6 (six) hours as needed. Breakthrough pain      . pantoprazole (PROTONIX) 40 MG tablet Take 1 tablet (40 mg total) by mouth daily at 12 noon.  30 tablet  0  . simvastatin (ZOCOR) 40 MG tablet Take 1 tablet (40 mg total) by mouth at bedtime.  30 tablet  0  . warfarin (COUMADIN) 5 MG tablet Take 0.5 tablets (2.5 mg total) by mouth daily. Pt takes 5 mg every day except on Monday and Friday pt takes 2.5mg  ( 1/2 tab of 5 mg tablet)  30 tablet  0  . zolpidem (AMBIEN) 5 MG tablet Take 1 tablet (5 mg total) by mouth at bedtime as needed for sleep  (insomnia).  30 tablet  0    Blood pressure 141/71, pulse 71, temperature 98.1 F (36.7 C), temperature source Oral, resp. rate 18, height 5\' 11"  (1.803 m), weight 194 lb 0.1 oz (88 kg), SpO2 98.00%. Physical Exam: General: pleasant, WD, WN white male who is laying in bed in NAD HEENT: head is normocephalic, atraumatic.  Sclera are noninjected.  PERRL.  Ears and nose without any masses or lesions.  Mouth is pink and moist Heart: regular, rate, and rhythm.  Normal s1,s2. No obvious murmurs, gallops, or rubs noted.  Palpable radial and pedal pulses bilaterally Lungs: CTAB, no wheezes, rhonchi, or rales noted.  Respiratory effort nonlabored Abd: soft, tender on the right side of his abdomen, minimal distention, hypoactive BS, ostomy with little bit of air and small amount of soft stool.  Midline scar noted from prior laparotomy.  NGT with bilious output. no masses, hernias, or organomegaly MS: all 4 extremities are symmetrical with no cyanosis, clubbing, or edema. Skin: warm and dry with no masses, lesions, or rashes Psych: A&Ox3 with an appropriate affect.    Results for orders placed during the hospital encounter of 01/10/12 (from the past 48 hour(s))  COMPREHENSIVE METABOLIC PANEL     Status: Abnormal   Collection Time   01/10/12 10:00 PM      Component Value Range Comment   Sodium 132 (*) 135 - 145 mEq/L    Potassium 3.8  3.5 - 5.1 mEq/L    Chloride 92 (*) 96 - 112 mEq/L    CO2 28  19 - 32 mEq/L    Glucose, Bld 201 (*) 70 - 99 mg/dL    BUN 10  6 - 23 mg/dL    Creatinine, Ser 1.61  0.50 - 1.35 mg/dL    Calcium 09.6  8.4 - 10.5 mg/dL    Total Protein 8.2  6.0 - 8.3 g/dL    Albumin 3.3 (*) 3.5 - 5.2 g/dL    AST 15  0 - 37 U/L    ALT 16  0 - 53 U/L    Alkaline Phosphatase 98  39 - 117 U/L    Total Bilirubin 0.6  0.3 - 1.2 mg/dL    GFR calc non Af Amer 59 (*) >90 mL/min    GFR calc Af Amer 68 (*) >90 mL/min   LIPASE, BLOOD     Status: Normal   Collection Time   01/10/12 10:00 PM       Component Value Range Comment   Lipase 12  11 - 59 U/L   CBC WITH DIFFERENTIAL  Status: Abnormal   Collection Time   01/10/12 10:00 PM      Component Value Range Comment   WBC 13.8 (*) 4.0 - 10.5 K/uL    RBC 3.91 (*) 4.22 - 5.81 MIL/uL    Hemoglobin 12.5 (*) 13.0 - 17.0 g/dL    HCT 40.9 (*) 81.1 - 52.0 %    MCV 93.9  78.0 - 100.0 fL    MCH 32.0  26.0 - 34.0 pg    MCHC 34.1  30.0 - 36.0 g/dL    RDW 91.4  78.2 - 95.6 %    Platelets 381  150 - 400 K/uL    Neutrophils Relative 90 (*) 43 - 77 %    Neutro Abs 12.4 (*) 1.7 - 7.7 K/uL    Lymphocytes Relative 3 (*) 12 - 46 %    Lymphs Abs 0.4 (*) 0.7 - 4.0 K/uL    Monocytes Relative 7  3 - 12 %    Monocytes Absolute 1.0  0.1 - 1.0 K/uL    Eosinophils Relative 0  0 - 5 %    Eosinophils Absolute 0.0  0.0 - 0.7 K/uL    Basophils Relative 0  0 - 1 %    Basophils Absolute 0.1  0.0 - 0.1 K/uL   DIGOXIN LEVEL     Status: Normal   Collection Time   01/10/12 10:00 PM      Component Value Range Comment   Digoxin Level 0.8  0.8 - 2.0 ng/mL   PROTIME-INR     Status: Abnormal   Collection Time   01/10/12 10:00 PM      Component Value Range Comment   Prothrombin Time 20.0 (*) 11.6 - 15.2 seconds    INR 1.67 (*) 0.00 - 1.49   URINALYSIS, ROUTINE W REFLEX MICROSCOPIC     Status: Abnormal   Collection Time   01/10/12 11:16 PM      Component Value Range Comment   Color, Urine AMBER (*) YELLOW BIOCHEMICALS MAY BE AFFECTED BY COLOR   APPearance CLOUDY (*) CLEAR    Specific Gravity, Urine 1.025  1.005 - 1.030    pH 5.5  5.0 - 8.0    Glucose, UA NEGATIVE  NEGATIVE mg/dL    Hgb urine dipstick NEGATIVE  NEGATIVE    Bilirubin Urine SMALL (*) NEGATIVE    Ketones, ur NEGATIVE  NEGATIVE mg/dL    Protein, ur 213 (*) NEGATIVE mg/dL    Urobilinogen, UA 1.0  0.0 - 1.0 mg/dL    Nitrite NEGATIVE  NEGATIVE    Leukocytes, UA NEGATIVE  NEGATIVE   URINE MICROSCOPIC-ADD ON     Status: Abnormal   Collection Time   01/10/12 11:16 PM      Component Value Range Comment     Squamous Epithelial / LPF FEW (*) RARE    WBC, UA 3-6  <3 WBC/hpf    RBC / HPF 0-2  <3 RBC/hpf    Bacteria, UA RARE  RARE    Casts HYALINE CASTS (*) NEGATIVE GRANULAR CAST   Urine-Other MUCOUS PRESENT     GLUCOSE, CAPILLARY     Status: Abnormal   Collection Time   01/11/12  4:49 AM      Component Value Range Comment   Glucose-Capillary 143 (*) 70 - 99 mg/dL   BASIC METABOLIC PANEL     Status: Abnormal   Collection Time   01/11/12  5:15 AM      Component Value Range Comment   Sodium 136  135 - 145 mEq/L    Potassium 4.1  3.5 - 5.1 mEq/L    Chloride 97  96 - 112 mEq/L    CO2 31  19 - 32 mEq/L    Glucose, Bld 139 (*) 70 - 99 mg/dL    BUN 10  6 - 23 mg/dL    Creatinine, Ser 1.61  0.50 - 1.35 mg/dL    Calcium 8.7  8.4 - 09.6 mg/dL    GFR calc non Af Amer 58 (*) >90 mL/min    GFR calc Af Amer 67 (*) >90 mL/min   CBC     Status: Abnormal   Collection Time   01/11/12  5:15 AM      Component Value Range Comment   WBC 6.7  4.0 - 10.5 K/uL    RBC 3.18 (*) 4.22 - 5.81 MIL/uL    Hemoglobin 9.7 (*) 13.0 - 17.0 g/dL    HCT 04.5 (*) 40.9 - 52.0 %    MCV 95.3  78.0 - 100.0 fL    MCH 30.5  26.0 - 34.0 pg    MCHC 32.0  30.0 - 36.0 g/dL    RDW 81.1  91.4 - 78.2 %    Platelets 289  150 - 400 K/uL   DIGOXIN LEVEL     Status: Normal   Collection Time   01/11/12  5:15 AM      Component Value Range Comment   Digoxin Level 0.9  0.8 - 2.0 ng/mL   GLUCOSE, CAPILLARY     Status: Abnormal   Collection Time   01/11/12  7:17 AM      Component Value Range Comment   Glucose-Capillary 114 (*) 70 - 99 mg/dL    Comment 1 Notify RN      Comment 2 Documented in Chart     GLUCOSE, CAPILLARY     Status: Normal   Collection Time   01/11/12 11:32 AM      Component Value Range Comment   Glucose-Capillary 96  70 - 99 mg/dL    Comment 1 Notify RN      Comment 2 Documented in Chart      Ct Abdomen Pelvis W Contrast  01/11/2012  *RADIOLOGY REPORT*  Clinical Data: Severe upper abdominal pain.  Nausea and vomiting.  White cell count 13.8.  History of colon cancer.  CT ABDOMEN AND PELVIS WITH CONTRAST  Technique:  Multidetector CT imaging of the abdomen and pelvis was performed following the standard protocol during bolus administration of intravenous contrast.  Contrast: OMNIPAQUE IOHEXOL 300 MG/ML  SOLN  Comparison: 01/05/2012  Findings: The lung bases are clear.  Mild pericardial effusion or thickening.  Calcification in the mitral valve annulus region.  Sub centimeter cysts in the liver appears stable since previous study.  The gallbladder is contracted.  Spleen size is normal. Adrenal glands are normal.  No retroperitoneal lymphadenopathy. Kidneys are symmetrical without hydronephrosis or solid mass. Calcification of the abdominal aorta with infrarenal abdominal aortic aneurysm measuring 4 cm diameter, stable since previous study.  The stomach is distended.  Small bowel are distended with predominately fluid.  Terminal ileum is decompressed.  Transition zone appears to be a region of thick-walled terminal ileum in the right pelvis.  Changes are consistent with small bowel obstruction. No free air or free fluid in the abdomen.  Pelvis:  Postoperative changes with a distal colonic resection and rectal stump.  Left lower quadrant descending colostomy.  Appendix is normal.  No free or loculated  pelvic fluid collections.  Diffuse bladder wall thickening suggesting hypertrophy or infection.  This is stable.  Since the previous study, a left pelvic drainage catheter has been removed.  There may be a small residual fluid collection in the left presacral space extending superiorly.  This measures about 17 mm diameter and appears stable since previous study.  IMPRESSION: Fluid distension of the small bowel with transition zone in the right pelvis representing a segment of thick-walled bowel.  Changes are consistent with small bowel obstruction.  Cause could be due to a effusions or reactive inflammatory process.  Small residual  pelvic fluid collection in the region of the previous drainage catheter.  Examination is otherwise stable.   Original Report Authenticated By: Marlon Pel, M.D.    Dg Abd Acute W/chest  01/10/2012  *RADIOLOGY REPORT*  Clinical Data: Abdominal pain.  Rectal cancer and pelvic abscess.  ACUTE ABDOMEN SERIES (ABDOMEN 2 VIEW & CHEST 1 VIEW)  Comparison: Abdomen and pelvis CT dated 01/05/2012. Chest CT dated 03/2012.  Portable chest dated 12/20/2011.  Findings: Left subclavian porta catheter tip in the superior vena cava.  Normal sized heart.  Left nipple shadow, confirmed on the CT dated 12/13/2011. The lungs are mildly hyperexpanded with mild peribronchial thickening and minimally prominent interstitial markings.  CT oral contrast in the colon.  The colon is normal in caliber. Mildly dilated small bowel loops.  Air-fluid levels in the small bowel and stomach.  A small amount of air medial to the gastric air bubble is most likely in a medial recess of the stomach.  No free peritoneal air beneath the hemidiaphragms.  Lumbar spine degenerative changes and mild scoliosis.  IMPRESSION:  1.  Interval small bowel ileus or partial obstruction. 2.  Mild changes of COPD and chronic bronchitis.   Original Report Authenticated By: Darrol Angel, M.D.        Assessment/Plan 1. SBO, ? Secondary to radiation eneteritis 2. T4 N2 rectal carcinoma  Plan: 1. Agree with NGT placement and conservative management at this time.  I will have Dr. Dwain Sarna review the case and evaluate the patient.  Hopefully, the patient will not need any type of surgical intervention; however, if he doesn't open up and improve, he may require something to be done.  Would recommend informing Dr. Clelia Croft of the patient's admission since he is supposed to start a new round of chemotherapy on Wednesday.  We will follow with you.  Cedar Roseman E 01/11/2012, 3:35 PM

## 2012-01-11 NOTE — ED Notes (Signed)
Wife Rigoberto Nandin [phone]; call if needed

## 2012-01-11 NOTE — ED Provider Notes (Signed)
Nursing notes and vitals signs, including pulse oximetry, reviewed.  Summary of this visit's results, reviewed by myself:  Labs:  Results for orders placed during the hospital encounter of 01/10/12  COMPREHENSIVE METABOLIC PANEL      Component Value Range   Sodium 132 (*) 135 - 145 mEq/L   Potassium 3.8  3.5 - 5.1 mEq/L   Chloride 92 (*) 96 - 112 mEq/L   CO2 28  19 - 32 mEq/L   Glucose, Bld 201 (*) 70 - 99 mg/dL   BUN 10  6 - 23 mg/dL   Creatinine, Ser 4.09  0.50 - 1.35 mg/dL   Calcium 81.1  8.4 - 91.4 mg/dL   Total Protein 8.2  6.0 - 8.3 g/dL   Albumin 3.3 (*) 3.5 - 5.2 g/dL   AST 15  0 - 37 U/L   ALT 16  0 - 53 U/L   Alkaline Phosphatase 98  39 - 117 U/L   Total Bilirubin 0.6  0.3 - 1.2 mg/dL   GFR calc non Af Amer 59 (*) >90 mL/min   GFR calc Af Amer 68 (*) >90 mL/min  LIPASE, BLOOD      Component Value Range   Lipase 12  11 - 59 U/L  URINALYSIS, ROUTINE W REFLEX MICROSCOPIC      Component Value Range   Color, Urine AMBER (*) YELLOW   APPearance CLOUDY (*) CLEAR   Specific Gravity, Urine 1.025  1.005 - 1.030   pH 5.5  5.0 - 8.0   Glucose, UA NEGATIVE  NEGATIVE mg/dL   Hgb urine dipstick NEGATIVE  NEGATIVE   Bilirubin Urine SMALL (*) NEGATIVE   Ketones, ur NEGATIVE  NEGATIVE mg/dL   Protein, ur 782 (*) NEGATIVE mg/dL   Urobilinogen, UA 1.0  0.0 - 1.0 mg/dL   Nitrite NEGATIVE  NEGATIVE   Leukocytes, UA NEGATIVE  NEGATIVE  CBC WITH DIFFERENTIAL      Component Value Range   WBC 13.8 (*) 4.0 - 10.5 K/uL   RBC 3.91 (*) 4.22 - 5.81 MIL/uL   Hemoglobin 12.5 (*) 13.0 - 17.0 g/dL   HCT 95.6 (*) 21.3 - 08.6 %   MCV 93.9  78.0 - 100.0 fL   MCH 32.0  26.0 - 34.0 pg   MCHC 34.1  30.0 - 36.0 g/dL   RDW 57.8  46.9 - 62.9 %   Platelets 381  150 - 400 K/uL   Neutrophils Relative 90 (*) 43 - 77 %   Neutro Abs 12.4 (*) 1.7 - 7.7 K/uL   Lymphocytes Relative 3 (*) 12 - 46 %   Lymphs Abs 0.4 (*) 0.7 - 4.0 K/uL   Monocytes Relative 7  3 - 12 %   Monocytes Absolute 1.0  0.1 - 1.0  K/uL   Eosinophils Relative 0  0 - 5 %   Eosinophils Absolute 0.0  0.0 - 0.7 K/uL   Basophils Relative 0  0 - 1 %   Basophils Absolute 0.1  0.0 - 0.1 K/uL  DIGOXIN LEVEL      Component Value Range   Digoxin Level 0.8  0.8 - 2.0 ng/mL  PROTIME-INR      Component Value Range   Prothrombin Time 20.0 (*) 11.6 - 15.2 seconds   INR 1.67 (*) 0.00 - 1.49  URINE MICROSCOPIC-ADD ON      Component Value Range   Squamous Epithelial / LPF FEW (*) RARE   WBC, UA 3-6  <3 WBC/hpf   RBC / HPF 0-2  <  3 RBC/hpf   Bacteria, UA RARE  RARE   Casts HYALINE CASTS (*) NEGATIVE   Urine-Other MUCOUS PRESENT      Imaging Studies: Ct Abdomen Pelvis W Contrast  01/11/2012  *RADIOLOGY REPORT*  Clinical Data: Severe upper abdominal pain.  Nausea and vomiting. White cell count 13.8.  History of colon cancer.  CT ABDOMEN AND PELVIS WITH CONTRAST  Technique:  Multidetector CT imaging of the abdomen and pelvis was performed following the standard protocol during bolus administration of intravenous contrast.  Contrast: OMNIPAQUE IOHEXOL 300 MG/ML  SOLN  Comparison: 01/05/2012  Findings: The lung bases are clear.  Mild pericardial effusion or thickening.  Calcification in the mitral valve annulus region.  Sub centimeter cysts in the liver appears stable since previous study.  The gallbladder is contracted.  Spleen size is normal. Adrenal glands are normal.  No retroperitoneal lymphadenopathy. Kidneys are symmetrical without hydronephrosis or solid mass. Calcification of the abdominal aorta with infrarenal abdominal aortic aneurysm measuring 4 cm diameter, stable since previous study.  The stomach is distended.  Small bowel are distended with predominately fluid.  Terminal ileum is decompressed.  Transition zone appears to be a region of thick-walled terminal ileum in the right pelvis.  Changes are consistent with small bowel obstruction. No free air or free fluid in the abdomen.  Pelvis:  Postoperative changes with a distal  colonic resection and rectal stump.  Left lower quadrant descending colostomy.  Appendix is normal.  No free or loculated pelvic fluid collections.  Diffuse bladder wall thickening suggesting hypertrophy or infection.  This is stable.  Since the previous study, a left pelvic drainage catheter has been removed.  There may be a small residual fluid collection in the left presacral space extending superiorly.  This measures about 17 mm diameter and appears stable since previous study.  IMPRESSION: Fluid distension of the small bowel with transition zone in the right pelvis representing a segment of thick-walled bowel.  Changes are consistent with small bowel obstruction.  Cause could be due to a effusions or reactive inflammatory process.  Small residual pelvic fluid collection in the region of the previous drainage catheter.  Examination is otherwise stable.   Original Report Authenticated By: Marlon Pel, M.D.    Dg Abd Acute W/chest  01/10/2012  *RADIOLOGY REPORT*  Clinical Data: Abdominal pain.  Rectal cancer and pelvic abscess.  ACUTE ABDOMEN SERIES (ABDOMEN 2 VIEW & CHEST 1 VIEW)  Comparison: Abdomen and pelvis CT dated 01/05/2012. Chest CT dated 03/2012.  Portable chest dated 12/20/2011.  Findings: Left subclavian porta catheter tip in the superior vena cava.  Normal sized heart.  Left nipple shadow, confirmed on the CT dated 12/13/2011. The lungs are mildly hyperexpanded with mild peribronchial thickening and minimally prominent interstitial markings.  CT oral contrast in the colon.  The colon is normal in caliber. Mildly dilated small bowel loops.  Air-fluid levels in the small bowel and stomach.  A small amount of air medial to the gastric air bubble is most likely in a medial recess of the stomach.  No free peritoneal air beneath the hemidiaphragms.  Lumbar spine degenerative changes and mild scoliosis.  IMPRESSION:  1.  Interval small bowel ileus or partial obstruction. 2.  Mild changes of COPD and  chronic bronchitis.   Original Report Authenticated By: Darrol Angel, M.D.       Hanley Seamen, MD 01/11/12 [phone]

## 2012-01-12 ENCOUNTER — Inpatient Hospital Stay (HOSPITAL_COMMUNITY): Payer: Medicare Other

## 2012-01-12 DIAGNOSIS — I4891 Unspecified atrial fibrillation: Secondary | ICD-10-CM

## 2012-01-12 DIAGNOSIS — E119 Type 2 diabetes mellitus without complications: Secondary | ICD-10-CM

## 2012-01-12 DIAGNOSIS — I10 Essential (primary) hypertension: Secondary | ICD-10-CM | POA: Diagnosis present

## 2012-01-12 DIAGNOSIS — I480 Paroxysmal atrial fibrillation: Secondary | ICD-10-CM | POA: Diagnosis present

## 2012-01-12 DIAGNOSIS — D649 Anemia, unspecified: Secondary | ICD-10-CM | POA: Diagnosis present

## 2012-01-12 LAB — BASIC METABOLIC PANEL
BUN: 10 mg/dL (ref 6–23)
Calcium: 8.4 mg/dL (ref 8.4–10.5)
Creatinine, Ser: 1.12 mg/dL (ref 0.50–1.35)
GFR calc Af Amer: 72 mL/min — ABNORMAL LOW (ref >90)
GFR calc non Af Amer: 62 mL/min — ABNORMAL LOW (ref >90)
Glucose, Bld: 130 mg/dL — ABNORMAL HIGH (ref 70–99)

## 2012-01-12 LAB — GLUCOSE, CAPILLARY
Glucose-Capillary: 111 mg/dL — ABNORMAL HIGH (ref 70–99)
Glucose-Capillary: 114 mg/dL — ABNORMAL HIGH (ref 70–99)

## 2012-01-12 LAB — CBC
HCT: 32.5 % — ABNORMAL LOW (ref 39.0–52.0)
Hemoglobin: 10 g/dL — ABNORMAL LOW (ref 13.0–17.0)
MCH: 29.9 pg (ref 26.0–34.0)
MCHC: 30.8 g/dL (ref 30.0–36.0)
RDW: 15.8 % — ABNORMAL HIGH (ref 11.5–15.5)

## 2012-01-12 NOTE — Progress Notes (Signed)
He is not tender, has some air and stool in bag, feels better, will get films, hopefully will continue to improve

## 2012-01-12 NOTE — Progress Notes (Signed)
Patient ID: Vincent [name], male   DOB: 1933/03/24, 78 y.o.   MRN: [id number]    Subjective: Pt feels ok this morning.  Some tenderness.  Objective: Vital signs in last 24 hours: Temp:  [97.7 F (36.5 C)-98.1 F (36.7 C)] 98 F (36.7 C) (09/10 0618) Pulse Rate:  [71-90] 90  (09/10 0618) Resp:  [17-18] 18  (09/10 0618) BP: (129-159)/(70-80) 143/80 mmHg (09/10 0618) SpO2:  [90 %-98 %] 93 % (09/10 0618)    Intake/Output from previous day: 09/09 0701 - 09/10 0700 In: 818.8 [I.V.:818.8] Out: 400 [Urine:400] Intake/Output this shift:    PE: Abd: soft, tender in RLQ, few BS, more soft stool in ostomy this morning.  NGT with minimal output from overnight  Lab Results:   [facility] 01/12/12 0415 01/11/12 0515  WBC 6.5 6.7  HGB 10.0* 9.7*  HCT 32.5* 30.3*  PLT 261 289   BMET  Basename 01/12/12 0415 01/11/12 0515  NA 136 136  K 4.0 4.1  CL 99 97  CO2 30 31  GLUCOSE 130* 139*  BUN 10 10  CREATININE 1.12 1.18  CALCIUM 8.4 8.7   PT/INR  Basename 01/10/12 2200  LABPROT 20.0*  INR 1.67*   CMP     Component Value Date/Time   NA 136 01/12/2012 0415   NA 135* 01/05/2012 0907   NA 133 04/09/2011 0838   K 4.0 01/12/2012 0415   K 3.8 01/05/2012 0907   K 4.6 04/09/2011 0838   CL 99 01/12/2012 0415   CL 98 01/05/2012 0907   CL 98 04/09/2011 0838   CO2 30 01/12/2012 0415   CO2 28 01/05/2012 0907   CO2 31 04/09/2011 0838   GLUCOSE 130* 01/12/2012 0415   GLUCOSE 155* 01/05/2012 0907   GLUCOSE 136* 04/09/2011 0838   BUN 10 01/12/2012 0415   BUN 13.0 01/05/2012 0907   BUN 18 04/09/2011 0838   CREATININE 1.12 01/12/2012 0415   CREATININE 1.1 01/05/2012 0907   CREATININE 1.3* 04/09/2011 0838   CALCIUM 8.4 01/12/2012 0415   CALCIUM 8.8 04/09/2011 0838   PROT 8.2 01/10/2012 2200   PROT 5.9* 01/05/2012 0907   PROT 6.5 04/09/2011 0838   ALBUMIN 3.3* 01/10/2012 2200   ALBUMIN 2.5* 01/05/2012 0907   AST 15 01/10/2012 2200   AST 22 01/05/2012 0907   AST 18 04/09/2011 0838   ALT 16 01/10/2012 2200   ALT 21 01/05/2012  0907   ALKPHOS 98 01/10/2012 2200   ALKPHOS 66 01/05/2012 0907   ALKPHOS 67 04/09/2011 0838   BILITOT 0.6 01/10/2012 2200   BILITOT 0.50 01/05/2012 0907   BILITOT 0.70 04/09/2011 0838   GFRNONAA 62* 01/12/2012 0415   GFRAA 72* 01/12/2012 0415   Lipase     Component Value Date/Time   LIPASE 12 01/10/2012 2200       Studies/Results: Ct Abdomen Pelvis W Contrast  01/11/2012  *RADIOLOGY REPORT*  Clinical Data: Severe upper abdominal pain.  Nausea and vomiting. White cell count 13.8.  History of colon cancer.  CT ABDOMEN AND PELVIS WITH CONTRAST  Technique:  Multidetector CT imaging of the abdomen and pelvis was performed following the standard protocol during bolus administration of intravenous contrast.  Contrast: OMNIPAQUE IOHEXOL 300 MG/ML  SOLN  Comparison: 01/05/2012  Findings: The lung bases are clear.  Mild pericardial effusion or thickening.  Calcification in the mitral valve annulus region.  Sub centimeter cysts in the liver appears stable since previous study.  The gallbladder is contracted.  Spleen size  is normal. Adrenal glands are normal.  No retroperitoneal lymphadenopathy. Kidneys are symmetrical without hydronephrosis or solid mass. Calcification of the abdominal aorta with infrarenal abdominal aortic aneurysm measuring 4 cm diameter, stable since previous study.  The stomach is distended.  Small bowel are distended with predominately fluid.  Terminal ileum is decompressed.  Transition zone appears to be a region of thick-walled terminal ileum in the right pelvis.  Changes are consistent with small bowel obstruction. No free air or free fluid in the abdomen.  Pelvis:  Postoperative changes with a distal colonic resection and rectal stump.  Left lower quadrant descending colostomy.  Appendix is normal.  No free or loculated pelvic fluid collections.  Diffuse bladder wall thickening suggesting hypertrophy or infection.  This is stable.  Since the previous study, a left pelvic drainage catheter  has been removed.  There may be a small residual fluid collection in the left presacral space extending superiorly.  This measures about 17 mm diameter and appears stable since previous study.  IMPRESSION: Fluid distension of the small bowel with transition zone in the right pelvis representing a segment of thick-walled bowel.  Changes are consistent with small bowel obstruction.  Cause could be due to a effusions or reactive inflammatory process.  Small residual pelvic fluid collection in the region of the previous drainage catheter.  Examination is otherwise stable.   Original Report Authenticated By: Marlon Pel, M.D.    Dg Abd Acute W/chest  01/10/2012  *RADIOLOGY REPORT*  Clinical Data: Abdominal pain.  Rectal cancer and pelvic abscess.  ACUTE ABDOMEN SERIES (ABDOMEN 2 VIEW & CHEST 1 VIEW)  Comparison: Abdomen and pelvis CT dated 01/05/2012. Chest CT dated 03/2012.  Portable chest dated 12/20/2011.  Findings: Left subclavian porta catheter tip in the superior vena cava.  Normal sized heart.  Left nipple shadow, confirmed on the CT dated 12/13/2011. The lungs are mildly hyperexpanded with mild peribronchial thickening and minimally prominent interstitial markings.  CT oral contrast in the colon.  The colon is normal in caliber. Mildly dilated small bowel loops.  Air-fluid levels in the small bowel and stomach.  A small amount of air medial to the gastric air bubble is most likely in a medial recess of the stomach.  No free peritoneal air beneath the hemidiaphragms.  Lumbar spine degenerative changes and mild scoliosis.  IMPRESSION:  1.  Interval small bowel ileus or partial obstruction. 2.  Mild changes of COPD and chronic bronchitis.   Original Report Authenticated By: Darrol Angel, M.D.     Anti-infectives: Anti-infectives    None       Assessment/Plan  1. 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 Stage 3 rectal ca  Plan: 1. Repeat abdominal films today 2. Mobilize 3. Cont NPO  and NGT for now.   LOS: 2 days    Danylah Holden E 01/12/2012

## 2012-01-12 NOTE — Progress Notes (Signed)
Pt was bladder scanned after of urine was collected in the F/C bag attached to a [place] condom catheter over a 6hr period, pt was able to void into the urinal . The scanner showed 858 ml of urine within the bladder. Oncall MD called and an order was given to insert a foley catheter. 14 french F/C inserted and drained of urine. Pt tolerated well.

## 2012-01-12 NOTE — Progress Notes (Signed)
[facility] PROGRESS NOTE  LEDFORD GOODSON ZOX:[id number] DOB: [birth year] DOA: 01/10/2012 PCP: Aura Dials, MD  Assessment/Plan: Principal Problem:  *SBO (small bowel obstruction) Active Problems:  Rectal carcinoma  Abdominal pain  Hyponatremia  Weakness generalized  1. Small bowel obstruction:? Secondary to recurrent tumor versus radiation enteritis. Continue n.p.o., NG tube to low wall suction and IV fluids. Patient has improved with resolution of abdominal pain but no change in KUB. General surgery consultation appreciated and are following. 2. Anemia: Secondary to chronic disease/malignancy and related therapies. Stable. 3. Atrial fibrillation with controlled ventricular rate. Continue IV digoxin. Held Coumadin. INR was subtherapeutic on admission. If goes into RVR may have to transfer to telemetry and consider IV Cardizem drip versus trial of a IV scheduled metoprolol. Digoxin level: 0.9. 4. Type 2 diabetes mellitus: Held metformin. Sliding scale insulin. Reasonably controlled inpatient. 5. Advanced rectal cancer/T4 N2 disease and supposed to start chemotherapy on 9/11. Called and left message with Dr. Alver Fisher nurse regarding patient's admission on 9/9. 6. Hypertension: Controlled.  Code Status: Full Family Communication: Patient declines M.D. offer to discuss his care with his family. Disposition Plan: Home when stable.   Brief narrative: 78 year old male with history of advanced rectal cancer, colostomy, A. fib on Coumadin, DM 2 and hypertension presented to the ED with worsening abdominal pain, nausea and vomiting without diarrhea. Imaging studies in the emergency department were suggestive of small bowel obstruction.  Consultants:  General surgery.  Procedures:  NG tube to low wall suction.  Antibiotics:  None  HPI/Subjective: NG tube discomfort. Asking to eat. Denies abdominal pain. No CP/SOB.  Objective: Filed Vitals:   01/11/12 2200 01/12/12 0200  01/12/12 0618 01/12/12 1400  BP: 159/77 129/70 143/80 138/55  Pulse: 87 76 90 80  Temp: 97.9 F (36.6 C) 97.7 F (36.5 C) 98 F (36.7 C) 97.8 F (36.6 C)  TempSrc: Oral Oral Oral Oral  Resp: 18 17 18 18   Height:      Weight:      SpO2: 93% 90% 93% 95%    Intake/Output Summary (Last 24 hours) at 01/12/12 1708 Last data filed at 01/12/12 1436  Gross per 24 hour  Intake    300 ml  Output   1600 ml  Net  -1300 ml   Filed Weights   01/10/12 2122 01/11/12 0430  Weight: 88.905 kg (196 lb) 88 kg (194 lb 0.1 oz)    Exam:   General exam: Comfortable moderately built and nourished male patient lying supine in bed.  Respiratory system: Clear to auscultation. No increased work of breathing.  Cardiovascular system: S1 and S2 heard, irregularly irregular. No JVD, murmurs, gallops or pedal edema.  Gastrointestinal system: Abdomen is nondistended, soft and nontender. Normal bowel sounds heard. Colostomy in the left mid quadrant draining small amount of soft brown stools and some air. Midline laparotomy scar.  Central system: Alert and oriented. No focal neurological deficit.  Extremities: Symmetric 5 x 5 power.   Data Reviewed: Basic Metabolic Panel:  Lab 01/12/12 8119 01/11/12 0515 01/10/12 2200  NA 136 136 132*  K 4.0 4.1 3.8  CL 99 97 92*  CO2 30 31 28   GLUCOSE 130* 139* 201*  BUN 10 10 10   CREATININE 1.12 1.18 1.17  CALCIUM 8.4 8.7 10.0  MG -- -- --  PHOS -- -- --   Liver Function Tests:  Lab 01/10/12 2200  AST 15  ALT 16  ALKPHOS 98  BILITOT 0.6  PROT 8.2  ALBUMIN 3.3*  Lab 01/10/12 2200  LIPASE 12  AMYLASE --   No results found for this basename: AMMONIA:5 in the last 168 hours CBC:  Lab 01/12/12 0415 01/11/12 0515 01/10/12 2200  WBC 6.5 6.7 13.8*  NEUTROABS -- -- 12.4*  HGB 10.0* 9.7* 12.5*  HCT 32.5* 30.3* 36.7*  MCV 97.0 95.3 93.9  PLT 261 289 381   Cardiac Enzymes: No results found for this basename:  CKTOTAL:5,CKMB:5,CKMBINDEX:5,TROPONINI:5 in the last 168 hours BNP (last 3 results) No results found for this basename: PROBNP:3 in the last 8760 hours CBG:  Lab 01/12/12 1634 01/12/12 1150 01/12/12 0725 01/12/12 0345 01/12/12 0011  GLUCAP 93 114* 117* 111* 103*    Recent Results (from the past 240 hour(s))  URINE CULTURE     Status: Normal   Collection Time   01/05/12 11:54 AM      Component Value Range Status Comment   Specimen Description URINE, RANDOM   Final    Special Requests NONE   Final    Culture  Setup Time 01/06/2012 00:54   Final    Colony Count 2,000 COLONIES/ML   Final    Culture INSIGNIFICANT GROWTH   Final    Report Status 01/07/2012 FINAL   Final      Studies: Ct Abdomen Pelvis W Contrast  01/11/2012  *RADIOLOGY REPORT*  Clinical Data: Severe upper abdominal pain.  Nausea and vomiting. White cell count 13.8.  History of colon cancer.  CT ABDOMEN AND PELVIS WITH CONTRAST  Technique:  Multidetector CT imaging of the abdomen and pelvis was performed following the standard protocol during bolus administration of intravenous contrast.  Contrast: OMNIPAQUE IOHEXOL 300 MG/ML  SOLN  Comparison: 01/05/2012  Findings: The lung bases are clear.  Mild pericardial effusion or thickening.  Calcification in the mitral valve annulus region.  Sub centimeter cysts in the liver appears stable since previous study.  The gallbladder is contracted.  Spleen size is normal. Adrenal glands are normal.  No retroperitoneal lymphadenopathy. Kidneys are symmetrical without hydronephrosis or solid mass. Calcification of the abdominal aorta with infrarenal abdominal aortic aneurysm measuring 4 cm diameter, stable since previous study.  The stomach is distended.  Small bowel are distended with predominately fluid.  Terminal ileum is decompressed.  Transition zone appears to be a region of thick-walled terminal ileum in the right pelvis.  Changes are consistent with small bowel obstruction. No free air  or free fluid in the abdomen.  Pelvis:  Postoperative changes with a distal colonic resection and rectal stump.  Left lower quadrant descending colostomy.  Appendix is normal.  No free or loculated pelvic fluid collections.  Diffuse bladder wall thickening suggesting hypertrophy or infection.  This is stable.  Since the previous study, a left pelvic drainage catheter has been removed.  There may be a small residual fluid collection in the left presacral space extending superiorly.  This measures about 17 mm diameter and appears stable since previous study.  IMPRESSION: Fluid distension of the small bowel with transition zone in the right pelvis representing a segment of thick-walled bowel.  Changes are consistent with small bowel obstruction.  Cause could be due to a effusions or reactive inflammatory process.  Small residual pelvic fluid collection in the region of the previous drainage catheter.  Examination is otherwise stable.   Original Report Authenticated By: Marlon Pel, M.D.    Dg Abd 2 Views  01/12/2012  *RADIOLOGY REPORT*  Clinical Data: Abdominal pain, small bowel obstruction  ABDOMEN - 2 VIEW  Comparison: 01/11/2012  Findings: Similar degree of retained contrast and stool throughout the colon.  Mid abdominal small bowel dilatation persist with air- fluid levels.  NG tube in the stomach fundus.  Stomach is decompressed.  No definite free air.  Lung bases clear.  Normal heart size.  Degenerative changes of the spine.  IMPRESSION: Stable small bowel obstruction pattern.  Little interval change.   Original Report Authenticated By: Judie Petit. Ruel Favors, M.D.    Dg Abd Acute W/chest  01/10/2012  *RADIOLOGY REPORT*  Clinical Data: Abdominal pain.  Rectal cancer and pelvic abscess.  ACUTE ABDOMEN SERIES (ABDOMEN 2 VIEW & CHEST 1 VIEW)  Comparison: Abdomen and pelvis CT dated 01/05/2012. Chest CT dated 03/2012.  Portable chest dated 12/20/2011.  Findings: Left subclavian porta catheter tip in the superior  vena cava.  Normal sized heart.  Left nipple shadow, confirmed on the CT dated 12/13/2011. The lungs are mildly hyperexpanded with mild peribronchial thickening and minimally prominent interstitial markings.  CT oral contrast in the colon.  The colon is normal in caliber. Mildly dilated small bowel loops.  Air-fluid levels in the small bowel and stomach.  A small amount of air medial to the gastric air bubble is most likely in a medial recess of the stomach.  No free peritoneal air beneath the hemidiaphragms.  Lumbar spine degenerative changes and mild scoliosis.  IMPRESSION:  1.  Interval small bowel ileus or partial obstruction. 2.  Mild changes of COPD and chronic bronchitis.   Original Report Authenticated By: Darrol Angel, M.D.     Scheduled Meds:    . digoxin  0.125 mg Intravenous Daily  . insulin aspart  0-9 Units Subcutaneous Q4H  . pantoprazole (PROTONIX) IV  40 mg Intravenous Q24H   Continuous Infusions:    . sodium chloride 100 mL/hr at 01/12/12 1003  . DISCONTD: sodium chloride 1,000 mL (01/11/12 0440)      [facility]  [facility] Pager [phone]. If 8PM-8AM, please contact night-coverage at [email or web address], password [facility] 01/12/2012, 5:08 PM  LOS: 2 days

## 2012-01-13 ENCOUNTER — Inpatient Hospital Stay (HOSPITAL_COMMUNITY): Payer: Medicare Other

## 2012-01-13 ENCOUNTER — Other Ambulatory Visit: Payer: Medicare Other

## 2012-01-13 LAB — BASIC METABOLIC PANEL
BUN: 12 mg/dL (ref 6–23)
CO2: 32 mEq/L (ref 19–32)
Calcium: 8.3 mg/dL — ABNORMAL LOW (ref 8.4–10.5)
Creatinine, Ser: 1.16 mg/dL (ref 0.50–1.35)
GFR calc non Af Amer: 59 mL/min — ABNORMAL LOW (ref >90)
Glucose, Bld: 93 mg/dL (ref 70–99)

## 2012-01-13 LAB — CBC
HCT: 29.4 % — ABNORMAL LOW (ref 39.0–52.0)
Hemoglobin: 9.6 g/dL — ABNORMAL LOW (ref 13.0–17.0)
MCH: 31.2 pg (ref 26.0–34.0)
MCHC: 32.7 g/dL (ref 30.0–36.0)
MCV: 95.5 fL (ref 78.0–100.0)
RBC: 3.08 MIL/uL — ABNORMAL LOW (ref 4.22–5.81)

## 2012-01-13 LAB — GLUCOSE, CAPILLARY
Glucose-Capillary: 109 mg/dL — ABNORMAL HIGH (ref 70–99)
Glucose-Capillary: 90 mg/dL (ref 70–99)
Glucose-Capillary: 95 mg/dL (ref 70–99)

## 2012-01-13 MED ORDER — PHENOL 1.4 % MT LIQD
1.0000 | OROMUCOSAL | Status: DC | PRN
  Administered 2012-01-13: 1 via OROMUCOSAL
  Filled 2012-01-13: qty 177

## 2012-01-13 MED ORDER — FLEET ENEMA 7-19 GM/118ML RE ENEM
1.0000 | ENEMA | Freq: Once | RECTAL | Status: AC
  Administered 2012-01-13: 1 via RECTAL
  Filled 2012-01-13: qty 1

## 2012-01-13 MED ORDER — MENTHOL 3 MG MT LOZG
1.0000 | LOZENGE | OROMUCOSAL | Status: DC | PRN
  Administered 2012-01-13: 3 mg via ORAL
  Filled 2012-01-13: qty 9

## 2012-01-13 NOTE — Progress Notes (Signed)
[facility] PROGRESS NOTE  MALAQUIAS LENKER ZOX:[id number] DOB: October 14, 1932 DOA: 01/10/2012 PCP: Aura Dials, MD  Assessment/Plan: Principal Problem:  *SBO (small bowel obstruction) Active Problems:  Rectal carcinoma  Abdominal pain  Hyponatremia  Weakness generalized  Anemia  A-fib  DM (diabetes mellitus)  HTN (hypertension)    1. Small bowel obstruction: Etiology is suspected to be either recurrent tumor versus radiation enteritis. Managing with bowel rest, NG-T, iv fluids and serial imaging studies. Surgical consultation was provided by Dr Emelia Loron, and we are managing as recommended. Patient has improved with resolution of abdominal pain, and AXR of 01/13/12, shows stable partial small bowel obstruction since yesterday, improved since the CT of 01/11/12. Enema administered today, ad already effects are evident.  2. Anemia: Secondary to chronic disease/malignancy and related therapies. Stable. 3. Atrial fibrillation with controlled ventricular rate. This is controlled with iv Digoxin. Coumadin is currently on hold. INR was subtherapeutic on admission. Digoxin level: 0.9. 4. Type 2 diabetes mellitus: Metformin is on hold. Managing with SSI, with satisfactory control. 5. Advanced rectal cancer/T4 N2 disease. Patient was supposed to start chemotherapy on 01/13/12. Dr. Alver Fisher service was informed of hospitalization, on 01/11/12. 6. Hypertension: Controlled.   Code Status: Full Code.  Family Communication:  Disposition Plan: To be determined.    Brief narrative: 78 year old male with history of advanced rectal cancer, colostomy, A. fib on Coumadin, DM 2 and hypertension presented to the ED with worsening abdominal pain, nausea and vomiting without diarrhea. Imaging studies in the emergency department were suggestive of small bowel obstruction.   Consultants:  Dr Emelia Loron, general surgeon.   Procedures:  Abdominal/CXR  CT abdo/pelvis  Abdominal X-Ray.    Antibiotics:  N/A.   HPI/Subjective: No new issues.   Objective: Vital signs in last 24 hours: Temp:  [98 F (36.7 C)-98.3 F (36.8 C)] 98 F (36.7 C) (09/11 1409) Pulse Rate:  [70-82] 81  (09/11 1409) Resp:  [16-22] 22  (09/11 1409) BP: (126-164)/(56-63) 164/63 mmHg (09/11 1409) SpO2:  [91 %-95 %] 94 % (09/11 1409) Weight change:  Last BM Date: 01/13/12  Intake/Output from previous day: 09/10 0701 - 09/11 0700 In: 1720 [P.O.:120; I.V.:1600] Out: 2675 [Urine:2175; Emesis/NG output:200; Stool:300] Total I/O In: -  Out: 1450 [Urine:600; Emesis/NG output:750; Stool:100]   Physical Exam: General: Comfortable, alert, communicative, fully oriented, not short of breath at rest. NG-T draining ++.  HEENT:  Mild clinical pallor, no jaundice, no conjunctival injection or discharge. Hydration is fair.   NECK:  Supple, JVP not seen, no carotid bruits, no palpable lymphadenopathy, no palpable goiter. CHEST:  Clinically clear to auscultation, no wheezes, no crackles. HEART:  Sounds 1 and 2 heard, normal, regular, no murmurs. ABDOMEN:  Full, soft, non-tender, no palpable organomegaly, no palpable masses, normal bowel sounds. Very small amount of stool in Ostomy bag. GENITALIA:  Not examined. LOWER EXTREMITIES:  No pitting edema, palpable peripheral pulses. MUSCULOSKELETAL SYSTEM:  Generalized osteoarthritic changes, otherwise, normal. CENTRAL NERVOUS SYSTEM:  No focal neurologic deficit on gross examination.  Lab Results:  Basename 01/13/12 0345 01/12/12 0415  WBC 5.7 6.5  HGB 9.6* 10.0*  HCT 29.4* 32.5*  PLT 238 261    Basename 01/13/12 0345 01/12/12 0415  NA 135 136  K 3.5 4.0  CL 99 99  CO2 32 30  GLUCOSE 93 130*  BUN 12 10  CREATININE 1.16 1.12  CALCIUM 8.3* 8.4   Recent Results (from the past 240 hour(s))  URINE CULTURE     Status:  Normal   Collection Time   01/05/12 11:54 AM      Component Value Range Status Comment   Specimen Description URINE, RANDOM   Final     Special Requests NONE   Final    Culture  Setup Time 01/06/2012 00:54   Final    Colony Count 2,000 COLONIES/ML   Final    Culture INSIGNIFICANT GROWTH   Final    Report Status 01/07/2012 FINAL   Final      Studies/Results: Dg Abd 2 Views  01-26-2012  *RADIOLOGY REPORT*  Clinical Data: Periumbilical abdominal pain.  Follow up small bowel obstruction.  ABDOMEN - 2 VIEW  Comparison: Two-view abdomen x-ray yesterday.  CT abdomen and pelvis 01/11/2012.  Findings: Persistent dilation of several loops of small bowel in the mid abdomen, not significantly changed since yesterday, though improved since the CT 2 days ago.  Residual CT oral contrast material throughout normal caliber colon.  A nasogastric tube tip in the stomach.  No evidence of free air on the erect image. Phleboliths in the left side of the pelvis.  No visible opaque urinary tract calculi.  IMPRESSION: Stable partial small bowel obstruction since yesterday, improved since the CT 2 days ago.  No free intraperitoneal air.   Original Report Authenticated By: Arnell Sieving, M.D.    Dg Abd 2 Views  01/12/2012  *RADIOLOGY REPORT*  Clinical Data: Abdominal pain, small bowel obstruction  ABDOMEN - 2 VIEW  Comparison: 01/11/2012  Findings: Similar degree of retained contrast and stool throughout the colon.  Mid abdominal small bowel dilatation persist with air- fluid levels.  NG tube in the stomach fundus.  Stomach is decompressed.  No definite free air.  Lung bases clear.  Normal heart size.  Degenerative changes of the spine.  IMPRESSION: Stable small bowel obstruction pattern.  Little interval change.   Original Report Authenticated By: Judie Petit. Ruel Favors, M.D.     Medications: Scheduled Meds:   . digoxin  0.125 mg Intravenous Daily  . insulin aspart  0-9 Units Subcutaneous Q4H  . pantoprazole (PROTONIX) IV  40 mg Intravenous Q24H  . sodium phosphate  1 enema Rectal Once   Continuous Infusions:   . sodium chloride 100 mL/hr at  01-26-2012 1746   PRN Meds:.acetaminophen, acetaminophen, albuterol, HYDROmorphone (DILAUDID) injection, ipratropium, menthol-cetylpyridinium, ondansetron (ZOFRAN) IV, ondansetron, phenol    LOS: 3 days   Olden Klauer,CHRISTOPHER  [facility] Pager [phone]. If 8PM-8AM, please contact night-coverage at [email or web address], password [facility] 01/26/12, 5:58 PM  LOS: 3 days

## 2012-01-13 NOTE — Progress Notes (Signed)
Patient ID: Vincent [name], male   DOB: 1933/04/07, 78 y.o.   MRN: [id number]    Subjective: Pt feels ok.  Pain is less today.  Objective: Vital signs in last 24 hours: Temp:  [97.8 F (36.6 C)-98.1 F (36.7 C)] 98.1 F (36.7 C) (09/11 0538) Pulse Rate:  [70-82] 82  (09/11 0538) Resp:  [16-18] 18  (09/11 0538) BP: (130-143)/(55-60) 143/56 mmHg (09/11 0538) SpO2:  [91 %-95 %] 92 % (09/11 0538) Last BM Date: 01-25-12  Intake/Output from previous day: 01/25/23 0701 - 09/11 0700 In: 1720 [P.O.:120; I.V.:1600] Out: 2675 [Urine:2175; Emesis/NG output:200; Stool:300] Intake/Output this shift:    PE: Abd: soft, less tender, few BS, ostomy bag with no output currently.  Appears to recently have been emptied.  NGT with some bilious output still  Lab Results:   Basename 01/13/12 0345 01/25/2012 0415  WBC 5.7 6.5  HGB 9.6* 10.0*  HCT 29.4* 32.5*  PLT 238 261   BMET  Basename 01/13/12 0345 2012/01/25 0415  NA 135 136  K 3.5 4.0  CL 99 99  CO2 32 30  GLUCOSE 93 130*  BUN 12 10  CREATININE 1.16 1.12  CALCIUM 8.3* 8.4   PT/INR  Basename 01/10/12 2200  LABPROT 20.0*  INR 1.67*   CMP     Component Value Date/Time   NA 135 01/13/2012 0345   NA 135* 01/05/2012 0907   NA 133 04/09/2011 0838   K 3.5 01/13/2012 0345   K 3.8 01/05/2012 0907   K 4.6 04/09/2011 0838   CL 99 01/13/2012 0345   CL 98 01/05/2012 0907   CL 98 04/09/2011 0838   CO2 32 01/13/2012 0345   CO2 28 01/05/2012 0907   CO2 31 04/09/2011 0838   GLUCOSE 93 01/13/2012 0345   GLUCOSE 155* 01/05/2012 0907   GLUCOSE 136* 04/09/2011 0838   BUN 12 01/13/2012 0345   BUN 13.0 01/05/2012 0907   BUN 18 04/09/2011 0838   CREATININE 1.16 01/13/2012 0345   CREATININE 1.1 01/05/2012 0907   CREATININE 1.3* 04/09/2011 0838   CALCIUM 8.3* 01/13/2012 0345   CALCIUM 8.8 04/09/2011 0838   PROT 8.2 01/10/2012 2200   PROT 5.9* 01/05/2012 0907   PROT 6.5 04/09/2011 0838   ALBUMIN 3.3* 01/10/2012 2200   ALBUMIN 2.5* 01/05/2012 0907   AST 15 01/10/2012 2200   AST  22 01/05/2012 0907   AST 18 04/09/2011 0838   ALT 16 01/10/2012 2200   ALT 21 01/05/2012 0907   ALKPHOS 98 01/10/2012 2200   ALKPHOS 66 01/05/2012 0907   ALKPHOS 67 04/09/2011 0838   BILITOT 0.6 01/10/2012 2200   BILITOT 0.50 01/05/2012 0907   BILITOT 0.70 04/09/2011 0838   GFRNONAA 59* 01/13/2012 0345   GFRAA 69* 01/13/2012 0345   Lipase     Component Value Date/Time   LIPASE 12 01/10/2012 2200       Studies/Results: Dg Abd 2 Views  January 25, 2012  *RADIOLOGY REPORT*  Clinical Data: Abdominal pain, small bowel obstruction  ABDOMEN - 2 VIEW  Comparison: 01/11/2012  Findings: Similar degree of retained contrast and stool throughout the colon.  Mid abdominal small bowel dilatation persist with air- fluid levels.  NG tube in the stomach fundus.  Stomach is decompressed.  No definite free air.  Lung bases clear.  Normal heart size.  Degenerative changes of the spine.  IMPRESSION: Stable small bowel obstruction pattern.  Little interval change.   Original Report Authenticated By: Judie Petit. TREVOR SHICK, M.D.  Anti-infectives: Anti-infectives    None       Assessment/Plan  1. SBO 2. Rectal carcinoma  Plan: 1. X-rays revealed continued SBO yesterday, but appeared better to [place].  His colon is full of stool.  I will give him an enema today through his colostomy to see if this will help continue to open him up. 2. Repeat films   LOS: 3 days    Gayna Braddy E 01/13/2012

## 2012-01-13 NOTE — Progress Notes (Signed)
He has some bs, ng output nonbilious, he has some stool in bag, films yesterday with contrast in colon so should improve, will check films today.  Agree with above

## 2012-01-14 ENCOUNTER — Inpatient Hospital Stay: Payer: Medicare Other

## 2012-01-14 ENCOUNTER — Inpatient Hospital Stay (HOSPITAL_COMMUNITY): Payer: Medicare Other

## 2012-01-14 DIAGNOSIS — R079 Chest pain, unspecified: Secondary | ICD-10-CM | POA: Diagnosis not present

## 2012-01-14 LAB — CBC
Hemoglobin: 11 g/dL — ABNORMAL LOW (ref 13.0–17.0)
MCHC: 32.8 g/dL (ref 30.0–36.0)
RBC: 3.59 MIL/uL — ABNORMAL LOW (ref 4.22–5.81)
WBC: 7.8 10*3/uL (ref 4.0–10.5)

## 2012-01-14 LAB — COMPREHENSIVE METABOLIC PANEL
ALT: 8 U/L (ref 0–53)
Alkaline Phosphatase: 79 U/L (ref 39–117)
Chloride: 97 mEq/L (ref 96–112)
GFR calc Af Amer: 79 mL/min — ABNORMAL LOW (ref >90)
Glucose, Bld: 94 mg/dL (ref 70–99)
Potassium: 3.4 mEq/L — ABNORMAL LOW (ref 3.5–5.1)
Sodium: 135 mEq/L (ref 135–145)
Total Bilirubin: 0.8 mg/dL (ref 0.3–1.2)
Total Protein: 6.4 g/dL (ref 6.0–8.3)

## 2012-01-14 LAB — TROPONIN I
Troponin I: <0.3 ng/mL (ref <0.30)
Troponin I: <0.3 ng/mL (ref <0.30)

## 2012-01-14 LAB — GLUCOSE, CAPILLARY
Glucose-Capillary: 89 mg/dL (ref 70–99)
Glucose-Capillary: 90 mg/dL (ref 70–99)
Glucose-Capillary: 92 mg/dL (ref 70–99)

## 2012-01-14 MED ORDER — NITROGLYCERIN 0.4 MG SL SUBL
0.4000 mg | SUBLINGUAL_TABLET | Freq: Once | SUBLINGUAL | Status: AC
  Administered 2012-01-14: 0.4 mg via SUBLINGUAL

## 2012-01-14 MED ORDER — NITROGLYCERIN 0.3 MG SL SUBL
0.3000 mg | SUBLINGUAL_TABLET | SUBLINGUAL | Status: DC | PRN
  Filled 2012-01-14: qty 100

## 2012-01-14 MED ORDER — LISINOPRIL 20 MG PO TABS
20.0000 mg | ORAL_TABLET | Freq: Every day | ORAL | Status: DC
  Administered 2012-01-14 – 2012-01-16 (×3): 20 mg via ORAL
  Filled 2012-01-14 (×3): qty 1

## 2012-01-14 MED ORDER — POTASSIUM CHLORIDE CRYS ER 20 MEQ PO TBCR
40.0000 meq | EXTENDED_RELEASE_TABLET | Freq: Once | ORAL | Status: AC
  Administered 2012-01-14: 40 meq via ORAL
  Filled 2012-01-14: qty 2

## 2012-01-14 MED ORDER — OXYCODONE HCL 5 MG PO TABS
5.0000 mg | ORAL_TABLET | Freq: Four times a day (QID) | ORAL | Status: DC | PRN
  Administered 2012-01-14: 5 mg via ORAL
  Filled 2012-01-14 (×2): qty 1

## 2012-01-14 MED ORDER — MORPHINE SULFATE 2 MG/ML IJ SOLN: 2.0000 mg | INTRAMUSCULAR | Status: DC | PRN

## 2012-01-14 MED ORDER — NITROGLYCERIN 0.4 MG SL SUBL
SUBLINGUAL_TABLET | SUBLINGUAL | Status: AC
  Filled 2012-01-14: qty 25

## 2012-01-14 MED ORDER — ENOXAPARIN SODIUM 40 MG/0.4ML ~~LOC~~ SOLN
40.0000 mg | Freq: Every day | SUBCUTANEOUS | Status: DC
  Administered 2012-01-14 – 2012-01-15 (×2): 40 mg via SUBCUTANEOUS
  Filled 2012-01-14 (×3): qty 0.4

## 2012-01-14 NOTE — Progress Notes (Signed)
Dr. Dwain Sarna aware on rounds pt c/o chest discomfort across entire chest. Denied pressure. Non radiating. No distress. VSS. See new order for EKG per MD.

## 2012-01-14 NOTE — Progress Notes (Signed)
  Comment:  Re-evaluated. Patient has remained chest pain free, following SL NTG. CXR is devoid of acute disease, and patient has no dyspnea. He is saturating at 93%-97% on RA. Cardiac enzymes have remained unelevated.   Plan: 1. Continue observation/Telemetry. 2. Prophylactic Lovenox. May discontinue SCD. 3. Patient may benefit from stratification, prior to discharge, in view of CAD risk factors.   C. Eiliana Drone. MD, FACP.

## 2012-01-14 NOTE — Progress Notes (Signed)
Nurse entered Pt's room to find NG tube out of Pt's rt nares and on the floor wrapped around the call light cord. The tube was also capped backwards. The Pt stated the NG tube fell out on it's own. He was assessed and did not appear to have any bleeding or injuries. On call MD for [facility] notified and said to leave out and monitor for N/V and complications.

## 2012-01-14 NOTE — Progress Notes (Signed)
Can advance diet

## 2012-01-14 NOTE — Progress Notes (Signed)
Agree with advancing diet,has stool in bag

## 2012-01-14 NOTE — Progress Notes (Signed)
ANTICOAGULATION CONSULT NOTE - Initial Consult  Pharmacy Consult for Lovenox Indication: Paroxysmal AFib  No Known Allergies  Patient Measurements: Height: 5\' 11"  (180.3 cm) Weight: 194 lb 0.1 oz (88 kg) IBW/kg (Calculated) : 75.3   Vital Signs: Temp: 98.5 F (36.9 C) (09/12 1400) Temp src: Oral (09/12 1400) BP: 153/72 mmHg (09/12 1400) Pulse Rate: 67  (09/12 1400)  Labs:  Basename 01/14/12 1709 01/14/12 1131 01/14/12 0416 01/13/12 0345 01/12/12 0415  HGB -- -- 11.0* 9.6* --  HCT -- -- 33.5* 29.4* 32.5*  PLT -- -- 322 238 261  APTT -- -- -- -- --  LABPROT -- -- -- -- --  INR -- -- -- -- --  HEPARINUNFRC -- -- -- -- --  CREATININE -- -- 1.03 1.16 1.12  CKTOTAL -- -- -- -- --  CKMB -- -- -- -- --  TROPONINI <0.30 <0.30 -- -- --    Estimated Creatinine Clearance: 65 ml/min (by C-G formula based on Cr of 1.03).   Medical History: Past Medical History  Diagnosis Date  . Hypertension   . Arthritis   . Heart disease   . SOB (shortness of breath)   . Malignant neoplasm of hepatic flexure   . Malignant neoplasm of rectosigmoid junction   . Cancer     and/or colon polyp  . Malignant neoplasm of hepatic flexure   . Malignant neoplasm of rectosigmoid junction   . Diabetes mellitus   . Atrial fibrillation     Medications:  Scheduled:    . digoxin  0.125 mg Intravenous Daily  . enoxaparin (LOVENOX) injection  40 mg Subcutaneous QPC supper  . insulin aspart  0-9 Units Subcutaneous Q4H  . lisinopril  20 mg Oral Daily  . nitroGLYCERIN      . nitroGLYCERIN  0.4 mg Sublingual Once  . pantoprazole (PROTONIX) IV  40 mg Intravenous Q24H  . 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  Pt  in NSR, on Digoxin 0.125mg  daily; level 0.9 on 9/9.  Begin Lovenox for VTE prophylaxis.  Goal of Therapy:  Lovenox dose appropriate for prophylaxis. Monitor platelets by anticoagulation protocol: Yes   Plan:  Lovenox 40mg  daily SQ.  Otho Bellows  PharmD Pager [phone] 01/14/2012,6:27 PM

## 2012-01-14 NOTE — Progress Notes (Signed)
[facility] PROGRESS NOTE  Vincent [name] ZOX:[id number] DOB: [birth year] DOA: 01/10/2012 PCP: Aura Dials, MD  Assessment/Plan: Principal Problem:  *SBO (small bowel obstruction) Active Problems:  Rectal carcinoma  Abdominal pain  Hyponatremia  Weakness generalized  Anemia  A-fib  DM (diabetes mellitus)  HTN (hypertension)    1. Small bowel obstruction: Etiology is suspected to be either recurrent tumor versus radiation enteritis. Managing with bowel rest, NG-T, iv fluids and serial imaging studies. Surgical consultation was provided by Dr Emelia Loron, and we are managing as recommended. Patient has improved with resolution of abdominal pain, and AXR of 01/13/12, shows stable partial small bowel obstruction since 01/12/12,  improved since the CT of 01/11/12. Enema was administered on 01/13/12, NGT-was discontinued by surgical team on the same date, and patient is on clears, today. Patient's ostomy bag contains semi-fluid stool.   2. Anemia: Secondary to chronic disease/malignancy and related therapies. Stable. 3. Atrial fibrillation with controlled ventricular rate. This is controlled with iv Digoxin. Coumadin is currently on hold. INR was subtherapeutic on admission. Digoxin level: 0.9. Patient is in SR today, consistent with PAF. Will restart Coumadin, when okayed by surgical team.  4. Type 2 diabetes mellitus: Metformin is on hold. Managing with SSI, with satisfactory control. 5. Advanced rectal cancer/T4 N2 disease. Patient was supposed to start chemotherapy on 01/13/12. Dr. Alver Fisher service was informed of hospitalization, on 01/11/12. 6. Hypertension: Uncontrolled today. Will add ACE-i to treatment.  7. Chest pain: Patient developed chest pain, described as "tightness: at about 10:00 AM today, not associated with dyspnea or diaphoresis. 12-Lead EKG showed SR, and no acute ischemic changes. Managed with SL NTG. Cycling cardiac enzymes. CXR ordered.   Code Status: Full Code.    Family Communication:  Disposition Plan: To be determined.    Brief narrative: 78 year old male with history of advanced rectal cancer, colostomy, A. fib on Coumadin, DM 2 and hypertension presented to the ED with worsening abdominal pain, nausea and vomiting without diarrhea. Imaging studies in the emergency department were suggestive of small bowel obstruction.   Consultants:  Dr Emelia Loron, general surgeon.   Procedures:  Abdominal/CXR  CT abdo/pelvis  Abdominal X-Ray.   Antibiotics:  N/A.   HPI/Subjective: C/o chest pain.   Objective: Vital signs in last 24 hours: Temp:  [98 F (36.7 C)-99 F (37.2 C)] 99 F (37.2 C) (09/12 0845) Pulse Rate:  [68-82] 68  (09/12 0950) Resp:  [16-22] 16  (09/12 0950) BP: (140-175)/(52-85) 158/52 mmHg (09/12 0950) SpO2:  [93 %-96 %] 95 % (09/12 0845) Weight change:  Last BM Date: 01/13/12  Intake/Output from previous day: 09/11 0701 - 09/12 0700 In: 2920 [P.O.:120; I.V.:2800] Out: 3675 [Urine:2425; Emesis/NG output:750; Stool:500] Total I/O In: -  Out: 75 [Stool:75]   Physical Exam: General: Communicative, fully oriented, not short of breath at rest. HEENT:  Mild clinical pallor, no jaundice, no conjunctival injection or discharge. Hydration is fair.   NECK:  Supple, JVP not seen, no carotid bruits, no palpable lymphadenopathy, no palpable goiter. CHEST:  Clinically clear to auscultation, no wheezes, no crackles. HEART:  Sounds 1 and 2 heard, normal, regular, no murmurs. ABDOMEN:  Full, soft, non-tender, no palpable organomegaly, no palpable masses, normal bowel sounds. Has semi-liquid stool in Ostomy bag. GENITALIA:  Not examined. LOWER EXTREMITIES:  No pitting edema, palpable peripheral pulses. MUSCULOSKELETAL SYSTEM:  Generalized osteoarthritic changes, otherwise, normal. CENTRAL NERVOUS SYSTEM:  No focal neurologic deficit on gross examination.  Lab Results:  Basename 01/14/12 0416 01/13/12 0345  WBC 7.8  5.7  HGB 11.0* 9.6*  HCT 33.5* 29.4*  PLT 322 238    Basename 01/14/12 0416 Jan 17, 2012 0345  NA 135 135  K 3.4* 3.5  CL 97 99  CO2 28 32  GLUCOSE 94 93  BUN 11 12  CREATININE 1.03 1.16  CALCIUM 8.6 8.3*   Recent Results (from the past 240 hour(s))  URINE CULTURE     Status: Normal   Collection Time   01/05/12 11:54 AM      Component Value Range Status Comment   Specimen Description URINE, RANDOM   Final    Special Requests NONE   Final    Culture  Setup Time 01/06/2012 00:54   Final    Colony Count 2,000 COLONIES/ML   Final    Culture INSIGNIFICANT GROWTH   Final    Report Status 01/07/2012 FINAL   Final      Studies/Results: Dg Abd 2 Views  17-Jan-2012  *RADIOLOGY REPORT*  Clinical Data: Periumbilical abdominal pain.  Follow up small bowel obstruction.  ABDOMEN - 2 VIEW  Comparison: Two-view abdomen x-ray yesterday.  CT abdomen and pelvis 01/11/2012.  Findings: Persistent dilation of several loops of small bowel in the mid abdomen, not significantly changed since yesterday, though improved since the CT 2 days ago.  Residual CT oral contrast material throughout normal caliber colon.  A nasogastric tube tip in the stomach.  No evidence of free air on the erect image. Phleboliths in the left side of the pelvis.  No visible opaque urinary tract calculi.  IMPRESSION: Stable partial small bowel obstruction since yesterday, improved since the CT 2 days ago.  No free intraperitoneal air.   Original Report Authenticated By: Arnell Sieving, M.D.     Medications: Scheduled Meds:    . digoxin  0.125 mg Intravenous Daily  . insulin aspart  0-9 Units Subcutaneous Q4H  . nitroGLYCERIN      . nitroGLYCERIN  0.4 mg Sublingual Once  . pantoprazole (PROTONIX) IV  40 mg Intravenous Q24H  . potassium chloride  40 mEq Oral Once   Continuous Infusions:    . sodium chloride 100 mL/hr at 01/14/12 0347   PRN Meds:.acetaminophen, acetaminophen, albuterol, ipratropium, menthol-cetylpyridinium,  morphine injection, nitroGLYCERIN, ondansetron (ZOFRAN) IV, ondansetron, oxyCODONE, phenol, DISCONTD:  HYDROmorphone (DILAUDID) injection    LOS: 4 days   Taylr Meuth,CHRISTOPHER  [facility] Pager [phone]. If 8PM-8AM, please contact night-coverage at [email or web address], password [facility] 01/14/2012, 11:01 AM  LOS: 4 days

## 2012-01-14 NOTE — Progress Notes (Addendum)
Patient ID: Vincent [name], male   DOB: 03-25-1933, 78 y.o.   MRN: [id number]    Subjective: Pt "feels like I'm dead" today.  He is not sick on his stomach after pulling his NGT out overnight.  Objective: Vital signs in last 24 hours: Temp:  [98 F (36.7 C)-99 F (37.2 C)] 98.7 F (37.1 C) (09/12 0604) Pulse Rate:  [69-82] 69  (09/12 0604) Resp:  [16-22] 18  (09/12 0604) BP: (126-175)/(63-85) 175/71 mmHg (09/12 0604) SpO2:  [93 %-96 %] 93 % (09/12 0604) Last BM Date: January 29, 2012  Intake/Output from previous day: 29-Jan-2023 0701 - 09/12 0700 In: 2920 [P.O.:120; I.V.:2800] Out: 3350 [Urine:2100; Emesis/NG output:750; Stool:500] Intake/Output this shift:    PE: Abd: soft, NT, ND, + stool in ostomy bag  Lab Results:   Basename 01/14/12 0416 2012/01/29 0345  WBC 7.8 5.7  HGB 11.0* 9.6*  HCT 33.5* 29.4*  PLT 322 238   BMET  Basename 01/14/12 0416 01-29-2012 0345  NA 135 135  K 3.4* 3.5  CL 97 99  CO2 28 32  GLUCOSE 94 93  BUN 11 12  CREATININE 1.03 1.16  CALCIUM 8.6 8.3*   PT/INR No results found for this basename: LABPROT:2,INR:2 in the last 72 hours CMP     Component Value Date/Time   NA 135 01/14/2012 0416   NA 135* 01/05/2012 0907   NA 133 04/09/2011 0838   K 3.4* 01/14/2012 0416   K 3.8 01/05/2012 0907   K 4.6 04/09/2011 0838   CL 97 01/14/2012 0416   CL 98 01/05/2012 0907   CL 98 04/09/2011 0838   CO2 28 01/14/2012 0416   CO2 28 01/05/2012 0907   CO2 31 04/09/2011 0838   GLUCOSE 94 01/14/2012 0416   GLUCOSE 155* 01/05/2012 0907   GLUCOSE 136* 04/09/2011 0838   BUN 11 01/14/2012 0416   BUN 13.0 01/05/2012 0907   BUN 18 04/09/2011 0838   CREATININE 1.03 01/14/2012 0416   CREATININE 1.1 01/05/2012 0907   CREATININE 1.3* 04/09/2011 0838   CALCIUM 8.6 01/14/2012 0416   CALCIUM 8.8 04/09/2011 0838   PROT 6.4 01/14/2012 0416   PROT 5.9* 01/05/2012 0907   PROT 6.5 04/09/2011 0838   ALBUMIN 2.6* 01/14/2012 0416   ALBUMIN 2.5* 01/05/2012 0907   AST 12 01/14/2012 0416   AST 22 01/05/2012 0907   AST  18 04/09/2011 0838   ALT 8 01/14/2012 0416   ALT 21 01/05/2012 0907   ALKPHOS 79 01/14/2012 0416   ALKPHOS 66 01/05/2012 0907   ALKPHOS 67 04/09/2011 0838   BILITOT 0.8 01/14/2012 0416   BILITOT 0.50 01/05/2012 0907   BILITOT 0.70 04/09/2011 0838   GFRNONAA 68* 01/14/2012 0416   GFRAA 79* 01/14/2012 0416   Lipase     Component Value Date/Time   LIPASE 12 01/10/2012 2200       Studies/Results: Dg Abd 2 Views  2012-01-29  *RADIOLOGY REPORT*  Clinical Data: Periumbilical abdominal pain.  Follow up small bowel obstruction.  ABDOMEN - 2 VIEW  Comparison: Two-view abdomen x-ray yesterday.  CT abdomen and pelvis 01/11/2012.  Findings: Persistent dilation of several loops of small bowel in the mid abdomen, not significantly changed since yesterday, though improved since the CT 2 days ago.  Residual CT oral contrast material throughout normal caliber colon.  A nasogastric tube tip in the stomach.  No evidence of free air on the erect image. Phleboliths in the left side of the pelvis.  No visible opaque urinary tract  calculi.  IMPRESSION: Stable partial small bowel obstruction since yesterday, improved since the CT 2 days ago.  No free intraperitoneal air.   Original Report Authenticated By: Arnell Sieving, M.D.    Dg Abd 2 Views  01/12/2012  *RADIOLOGY REPORT*  Clinical Data: Abdominal pain, small bowel obstruction  ABDOMEN - 2 VIEW  Comparison: 01/11/2012  Findings: Similar degree of retained contrast and stool throughout the colon.  Mid abdominal small bowel dilatation persist with air- fluid levels.  NG tube in the stomach fundus.  Stomach is decompressed.  No definite free air.  Lung bases clear.  Normal heart size.  Degenerative changes of the spine.  IMPRESSION: Stable small bowel obstruction pattern.  Little interval change.   Original Report Authenticated By: Judie Petit. Ruel Favors, M.D.     Anti-infectives: Anti-infectives    None       Assessment/Plan  1. SBO, improving clinically  Plan: 1.  Leave NGT out and allow clear liquids.  If he fails this, he may require an operation or another trial with his NGT back in.  Will follow.   LOS: 4 days    Syair Fricker E 01/14/2012  ADDENDUM: Checked on pt as MD told me he was c/o chest pain.  The patient c/o rightness in his chest.  It does not radiate anywhere, denies nausea, diaphoresis.  He states he does feel different today.  Increased tiredness, otherwise he can't seem to describe why he feels different today.  An EKG was done which revealed NSR with 1st degree AV block.    General: mild increased somnolence, but arousable and awakens and answers questions appropriately. Heart: regular with a few skipped beats Lungs: CTAB  I have spoken with the RN, and informed her that she needs to call Dr. Brien Few, his primary doctor to inform him of the situation and make sure he doesn't want to do any further treatment.  We will continue to follow along from an abdominal standpoint.  Tracyann Duffell E 10:23 AM 01/14/2012

## 2012-01-15 ENCOUNTER — Encounter (HOSPITAL_COMMUNITY): Payer: Self-pay | Admitting: Cardiology

## 2012-01-15 DIAGNOSIS — R338 Other retention of urine: Secondary | ICD-10-CM | POA: Diagnosis not present

## 2012-01-15 LAB — CBC
HCT: 28.8 % — ABNORMAL LOW (ref 39.0–52.0)
Hemoglobin: 9.7 g/dL — ABNORMAL LOW (ref 13.0–17.0)
MCHC: 33.7 g/dL (ref 30.0–36.0)
MCV: 92 fL (ref 78.0–100.0)
RDW: 15.2 % (ref 11.5–15.5)
WBC: 4.9 10*3/uL (ref 4.0–10.5)

## 2012-01-15 LAB — COMPREHENSIVE METABOLIC PANEL
Albumin: 2.2 g/dL — ABNORMAL LOW (ref 3.5–5.2)
Alkaline Phosphatase: 66 U/L (ref 39–117)
BUN: 9 mg/dL (ref 6–23)
Chloride: 97 mEq/L (ref 96–112)
Creatinine, Ser: 1.02 mg/dL (ref 0.50–1.35)
GFR calc Af Amer: 80 mL/min — ABNORMAL LOW (ref >90)
Glucose, Bld: 86 mg/dL (ref 70–99)
Total Bilirubin: 0.6 mg/dL (ref 0.3–1.2)

## 2012-01-15 LAB — GLUCOSE, CAPILLARY
Glucose-Capillary: 127 mg/dL — ABNORMAL HIGH (ref 70–99)
Glucose-Capillary: 133 mg/dL — ABNORMAL HIGH (ref 70–99)

## 2012-01-15 MED ORDER — DIGOXIN 125 MCG PO TABS
125.0000 ug | ORAL_TABLET | Freq: Every day | ORAL | Status: DC
  Administered 2012-01-16: 125 ug via ORAL
  Filled 2012-01-15 (×2): qty 1

## 2012-01-15 MED ORDER — WARFARIN - PHARMACIST DOSING INPATIENT: Freq: Every day | Status: DC

## 2012-01-15 MED ORDER — WARFARIN SODIUM 5 MG PO TABS
5.0000 mg | ORAL_TABLET | Freq: Once | ORAL | Status: AC
  Administered 2012-01-15: 5 mg via ORAL
  Filled 2012-01-15: qty 1

## 2012-01-15 NOTE — Progress Notes (Signed)
ANTICOAGULATION CONSULT NOTE  Pharmacy Consult for Coumadin Indication: Paroxysmal AFib  No Known Allergies  Patient Measurements: Height: 5\' 11"  (180.3 cm) Weight: 194 lb 0.1 oz (88 kg) IBW/kg (Calculated) : 75.3   Vital Signs: Temp: 98.8 F (37.1 C) (09/13 1000) Temp src: Oral (09/13 1000) BP: 153/67 mmHg (09/13 1000) Pulse Rate: 63  (09/13 1000)  Labs:  Basename 01/15/12 0438 01/14/12 2304 01/14/12 1709 01/14/12 1131 01/14/12 0416 01/13/12 0345  HGB 9.7* -- -- -- 11.0* --  HCT 28.8* -- -- -- 33.5* 29.4*  PLT 235 -- -- -- 322 238  APTT -- -- -- -- -- --  LABPROT -- -- -- -- -- --  INR -- -- -- -- -- --  HEPARINUNFRC -- -- -- -- -- --  CREATININE 1.02 -- -- -- 1.03 1.16  CKTOTAL -- -- -- -- -- --  CKMB -- -- -- -- -- --  TROPONINI -- <0.30 <0.30 <0.30 -- --    Estimated Creatinine Clearance: 65.6 ml/min (by C-G formula based on Cr of 1.02).  Medications:  Scheduled:     . digoxin  0.125 mg Intravenous Daily  . enoxaparin (LOVENOX) injection  40 mg Subcutaneous QPC supper  . insulin aspart  0-9 Units Subcutaneous Q4H  . lisinopril  20 mg Oral Daily  . nitroGLYCERIN      . pantoprazole (PROTONIX) IV  40 mg Intravenous Q24H  . potassium chloride  40 mEq Oral Once    Assessment:  54 yom with resolving SBO secondary to tumor or radiation enteritis  Chronic Warfarin for paroxysmal AFib, last INR 1.67 on 9/8; held on admission with SBO  Home dose Warfarin 5mg  daily except 2.5mg  on M,F  Lovenox 40mg  sq q24h started last night  OK per MDs to resume coumadin today  Goal of Therapy:  INR 2-3 Monitor platelets by anticoagulation protocol: Yes   Plan:   Coumadin 5mg  po x 1 today  Daily PT/INR  Continue Lovenox 40mg  daily SQ until INR therapeutic.  Loralee Pacas, PharmD, BCPS Pager: [phone] 01/15/2012,11:18 AM

## 2012-01-15 NOTE — Progress Notes (Signed)
  Subjective: Feels ok, tol clears  Objective: Vital signs in last 24 hours: Temp:  [97.8 F (36.6 C)-98.8 F (37.1 C)] 98.6 F (37 C) (09/13 0540) Pulse Rate:  [62-77] 68  (09/13 0540) Resp:  [16-18] 18  (09/13 0540) BP: (122-174)/(52-74) 153/62 mmHg (09/13 0540) SpO2:  [95 %-97 %] 96 % (09/13 0540) Last BM Date: 01/14/12  Intake/Output from previous day: 09/12 0701 - 09/13 0700 In: 1200 [P.O.:800; I.V.:400] Out: 2825 [Urine:2375; Stool:450] Intake/Output this shift:    GI: soft, nontender, bag with stool and gas  Lab Results:   [facility] 01/15/12 0438 01/14/12 0416  WBC 4.9 7.8  HGB 9.7* 11.0*  HCT 28.8* 33.5*  PLT 235 322   BMET  Basename 01/15/12 0438 01/14/12 0416  NA 133* 135  K 3.5 3.4*  CL 97 97  CO2 28 28  GLUCOSE 86 94  BUN 9 11  CREATININE 1.02 1.03  CALCIUM 8.2* 8.6    Studies/Results: Dg Chest Port 1 View  01/14/2012  *RADIOLOGY REPORT*  Clinical Data: Chest pain, shortness of breath.  PORTABLE CHEST - 1 VIEW  Comparison: 12/20/2011  Findings: Left Port-A-Cath remains in place, unchanged. Heart and mediastinal contours are within normal limits.  No focal opacities or effusions.  No acute bony abnormality.  IMPRESSION: No active cardiopulmonary disease.   Original Report Authenticated By: Cyndie Chime, M.D.     Assessment/Plan: Resolving sbo Will advance to regular diet, if does ok can go home from my standpoint   LOS: 5 days    [facility] 01/15/2012

## 2012-01-15 NOTE — Progress Notes (Addendum)
[facility] PROGRESS NOTE  KEIAN ODRISCOLL AVW:[id number] DOB: 06/04/1932 DOA: 01/10/2012 PCP: Aura Dials, MD  Assessment/Plan: Principal Problem:  *SBO (small bowel obstruction) Active Problems:  Rectal carcinoma  Abdominal pain  Hyponatremia  Weakness generalized  Anemia  A-fib  DM (diabetes mellitus)  HTN (hypertension)  Chest pain    1. Small bowel obstruction: Etiology is suspected to be either recurrent tumor versus radiation enteritis. Managed with bowel rest, NG-T, iv fluids and serial imaging studies. Surgical consultation was provided by Dr Emelia Loron. Patient has improved with resolution of abdominal pain, AXR of 01/13/12, showed stable partial small bowel obstruction since 01/12/12, improved since the CT of 01/11/12. Enema was administered on 01/13/12, and NGT-was discontinued by surgical team on the same date, without any deleterious effects. Diet was subsequently advanced, and as of 01/15/12, patient was tolerating a regular diet, with passage of flatus and stool.    2. Anemia: Secondary to chronic disease/malignancy and related therapies. Stable. 3. Atrial fibrillation; Patient appear to have PAF, with controlled ventricular rate. He was initially managed with iv Digoxin, but with commencement of oral intake, the oral formulation was reinstated. Coumadin was temporarily placed on hold, in anticipation of invasive procedures, but was recommenced on 01/15/12.  4. Type 2 diabetes mellitus: Managed with SSI, with satisfactory control. Metformin will be restarted on discharge. 5. Advanced rectal cancer/T4 N2 disease. Patient was supposed to start chemotherapy on 01/13/12. Dr. Alver Fisher service was informed of hospitalization, on 01/11/12, and he will follow up on discharge. 6. Hypertension: Sub-optimally controlled. Avoiding calcium channel antagonist, in view of bowel obstruction, so added ACE-i to treatment.  7. Chest pain: Patient developed chest pain, described as  "tightness" at about 10:00 AM on 01/14/12, not associated with dyspnea or diaphoresis. 12-Lead EKG showed SR, and no acute ischemic changes. Managed with SL NTG, with resolution. Cardiac enzymes were unelevated and CXR was devoid of acute disease. Have consulted Dr Nanetta Batty et al, and will await recommendations.   8. Acute urinary retention: Patient had an episode of acute urinary retention during hospitalization, which was readily addressed with insertion of a Foley catheter. Foley was discontinued on 01/15/12. If retention recurs, he will need Flomax, urology referral, and discharge with foley in-situ.   Code Status: Full Code.  Family Communication:  Disposition Plan: Possible discharge on 01/16/12.    Brief narrative: 78 year old male with history of advanced rectal cancer, colostomy, A. fib on Coumadin, DM 2 and hypertension presented to the ED with worsening abdominal pain, nausea and vomiting without diarrhea. Imaging studies in the emergency department were suggestive of small bowel obstruction.   Consultants:  Dr Emelia Loron, general surgeon.   Dr Nanetta Batty, cardiologist.  Procedures:  Abdominal/CXR  CT abdo/pelvis  Abdominal X-Ray.   Antibiotics:  N/A.   HPI/Subjective: Asymptomatic.   Objective: Vital signs in last 24 hours: Temp:  [97.8 F (36.6 C)-98.8 F (37.1 C)] 98.8 F (37.1 C) (09/13 1000) Pulse Rate:  [62-68] 63  (09/13 1000) Resp:  [16-18] 18  (09/13 1000) BP: (138-174)/(59-74) 153/67 mmHg (09/13 1000) SpO2:  [95 %-96 %] 96 % (09/13 1000) Weight change:  Last BM Date: 01/14/12  Intake/Output from previous day: 09/12 0701 - 09/13 0700 In: 1200 [P.O.:800; I.V.:400] Out: 2825 [Urine:2375; Stool:450] Total I/O In: 240 [P.O.:240] Out: 250 [Urine:250]   Physical Exam: General: Communicative, fully oriented, not short of breath at rest. HEENT:  Mild clinical pallor, no jaundice, no conjunctival injection or discharge. Hydration is  fair.  NECK:  Supple, JVP not seen, no carotid bruits, no palpable lymphadenopathy, no palpable goiter. CHEST:  Clinically clear to auscultation, no wheezes, no crackles. HEART:  Sounds 1 and 2 heard, normal, regular, no murmurs. ABDOMEN:  Full, soft, non-tender, no palpable organomegaly, no palpable masses, normal bowel sounds. Has stool/flatus in Ostomy bag. GENITALIA:  Not examined. LOWER EXTREMITIES:  No pitting edema, palpable peripheral pulses. MUSCULOSKELETAL SYSTEM:  Generalized osteoarthritic changes, otherwise, normal. CENTRAL NERVOUS SYSTEM:  No focal neurologic deficit on gross examination.  Lab Results:  Basename 01/15/12 0438 01/14/12 0416  WBC 4.9 7.8  HGB 9.7* 11.0*  HCT 28.8* 33.5*  PLT 235 322    Basename 01/15/12 0438 01/14/12 0416  NA 133* 135  K 3.5 3.4*  CL 97 97  CO2 28 28  GLUCOSE 86 94  BUN 9 11  CREATININE 1.02 1.03  CALCIUM 8.2* 8.6   No results found for this or any previous visit (from the past 240 hour(s)).   Studies/Results: Dg Chest Port 1 View  01/14/2012  *RADIOLOGY REPORT*  Clinical Data: Chest pain, shortness of breath.  PORTABLE CHEST - 1 VIEW  Comparison: 12/20/2011  Findings: Left Port-A-Cath remains in place, unchanged. Heart and mediastinal contours are within normal limits.  No focal opacities or effusions.  No acute bony abnormality.  IMPRESSION: No active cardiopulmonary disease.   Original Report Authenticated By: Cyndie Chime, M.D.     Medications: Scheduled Meds:    . digoxin  0.125 mg Intravenous Daily  . enoxaparin (LOVENOX) injection  40 mg Subcutaneous QPC supper  . insulin aspart  0-9 Units Subcutaneous Q4H  . lisinopril  20 mg Oral Daily  . nitroGLYCERIN      . pantoprazole (PROTONIX) IV  40 mg Intravenous Q24H  . warfarin  5 mg Oral ONCE-1800  . Warfarin - Pharmacist Dosing Inpatient   Does not apply q1800   Continuous Infusions:    . sodium chloride 50 mL/hr at 01/14/12 1600   PRN Meds:.acetaminophen,  acetaminophen, albuterol, ipratropium, menthol-cetylpyridinium, morphine injection, nitroGLYCERIN, ondansetron (ZOFRAN) IV, ondansetron, oxyCODONE, phenol    LOS: 5 days   Mansi Tokar,CHRISTOPHER  [facility] Pager [phone]. If 8PM-8AM, please contact night-coverage at [email or web address], password [facility] 01/15/2012, 12:44 PM  LOS: 5 days

## 2012-01-15 NOTE — Consult Note (Signed)
Reason for Consult: chest pain Referring Physician:  Dr Caren Griffins is an 78 y.o. male.    Chief Complaint: admitted with SBO and was ready to go home but developed chest pain last PM   HPI: 78 year old male with history of P. atrial fib  presented to the emergency room 01/11/12 with complaints of worsening severe upper abdominal pain. Patient states the pain had been in his epigastric region. He reports having 8/10 crampy pain, the pain did not radiate. He had been having nausea and vomiting without diarrhea. He does have history of colon cancer with prior partial colectomy. Patient has an ostomy bag and stated that he has been having normal stool output. He has had some difficulty urinating. He denied any back pain, chest pain or shortness of breath. Nothing seemed to be helping the pain, he does take oral oxycodone at home. He was seen in the ED and evaluated and a imaging studies, first an Acute ABD series followed by a CT scan of the ABD and Pelvis revealed a SBO, Small Bowel Obstruction, and an NGT was placed and pt was admitted.   Pt. Also has been on chemo.  The NG did help and pt has continued to improve.  NG out and pt began diet yesterday.   He also had an episode of chest pain, "tightness", NTG given then oxycodone. EKG on admit with SR with freq PACs. No acute changes on EKG with chest pain. Troponin I neg X 3.  Pt stated he wasn't sure if the NTG relieved the pain or the pain pill.  Last echo 08/2009  Left ventricle: The cavity size was normal. Systolic function was normal. The estimated ejection fraction was in the range of 55% to 65%. Although no diagnostic regional wall motion abnormality was identified, this possibility cannot be completely excluded on the basis of this study. Doppler parameters are consistent with abnormal left ventricular relaxation (grade 1 diastolic dysfunction). - Aortic valve: Mild regurgitation. - Mitral valve: Calcified annulus   Past Medical  History  Diagnosis Date  . Hypertension   . Arthritis   . Heart disease   . SOB (shortness of breath)   . Malignant neoplasm of hepatic flexure   . Malignant neoplasm of rectosigmoid junction   . Cancer     and/or colon polyp  . Malignant neoplasm of hepatic flexure   . Malignant neoplasm of rectosigmoid junction   . Diabetes mellitus   . Atrial fibrillation     Past Surgical History  Procedure Date  . Back surgery [phone]  . Arthroscopic knee 1992  . 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 Exploratory laparotomy 02/2010  . Rectosigmoid colon resection 02/2010  . Colostomy 02/2010  . Drainage of intra-abdominal abscess 02/2010  . Flexible sigmoidoscopy 12/18/2011    Procedure: FLEXIBLE SIGMOIDOSCOPY;  Surgeon: Beverley Fiedler, MD;  Location: [facility];  Service: Gastroenterology;  Laterality: N/A;  . Colon surgery     Family History  Problem Relation Age of Onset  . Cancer Father    Social History:  reports that he quit smoking about 3 years ago. His smoking use included Cigarettes. He has a 120 pack-year smoking history. He has never used smokeless tobacco. He reports that he does not drink alcohol. His drug history not on file.  Allergies: No Known Allergies  Medications Prior to Admission  Medication Sig Dispense Refill  . albuterol-ipratropium (COMBIVENT) 18-103 MCG/ACT inhaler Inhale 1 puff into the lungs every 4 (  four) hours as needed. WHEEZING      . digoxin (LANOXIN) 0.125 MG tablet Take 1 tablet (125 mcg total) by mouth daily.  30 tablet  0  . diltiazem (CARDIZEM) 30 MG tablet Take 1 tablet (30 mg total) by mouth every 8 (eight) hours.  90 tablet  0  . ferrous sulfate 325 (65 FE) MG tablet Take 1 tablet (325 mg total) by mouth daily with breakfast.  30 tablet  0  . Fluticasone-Salmeterol (ADVAIR DISKUS) 500-50 MCG/DOSE AEPB Inhale 1 puff into the lungs every 12 (twelve) hours.  60 each  3  . HYDROmorphone (DILAUDID) 4 MG tablet Take 4 mg by mouth every 6 (six)  hours as needed. Pain      . loperamide (IMODIUM) 2 MG capsule Take 1 capsule (2 mg total) by mouth 4 (four) times daily as needed for diarrhea or loose stools.  30 capsule  1  . metFORMIN (GLUCOPHAGE) 500 MG tablet Take 500 mg by mouth daily with breakfast.       . ondansetron (ZOFRAN) 8 MG tablet Take 1 tablet (8 mg total) by mouth every 8 (eight) hours as needed for nausea.  20 tablet  1  . oxyCODONE (OXY IR/ROXICODONE) 5 MG immediate release tablet Take 5 mg by mouth every 6 (six) hours as needed. Breakthrough pain      . pantoprazole (PROTONIX) 40 MG tablet Take 1 tablet (40 mg total) by mouth daily at 12 noon.  30 tablet  0  . simvastatin (ZOCOR) 40 MG tablet Take 1 tablet (40 mg total) by mouth at bedtime.  30 tablet  0  . warfarin (COUMADIN) 5 MG tablet Take 0.5 tablets (2.5 mg total) by mouth daily. Pt takes 5 mg every day except on Monday and Friday pt takes 2.5mg  ( 1/2 tab of 5 mg tablet)  30 tablet  0  . zolpidem (AMBIEN) 5 MG tablet Take 1 tablet (5 mg total) by mouth at bedtime as needed for sleep (insomnia).  30 tablet  0    Results for orders placed during the hospital encounter of 01/10/12 (from the past 48 hour(s))  GLUCOSE, CAPILLARY     Status: Abnormal   Collection Time   01/13/12  7:46 PM      Component Value Range Comment   Glucose-Capillary 100 (*) 70 - 99 mg/dL   GLUCOSE, CAPILLARY     Status: Normal   Collection Time   01/13/12 11:30 PM      Component Value Range Comment   Glucose-Capillary 81  70 - 99 mg/dL   GLUCOSE, CAPILLARY     Status: Normal   Collection Time   01/14/12  3:45 AM      Component Value Range Comment   Glucose-Capillary 85  70 - 99 mg/dL   CBC     Status: Abnormal   Collection Time   01/14/12  4:16 AM      Component Value Range Comment   WBC 7.8  4.0 - 10.5 K/uL    RBC 3.59 (*) 4.22 - 5.81 MIL/uL    Hemoglobin 11.0 (*) 13.0 - 17.0 g/dL    HCT 40.9 (*) 81.1 - 52.0 %    MCV 93.3  78.0 - 100.0 fL    MCH 30.6  26.0 - 34.0 pg    MCHC 32.8  30.0  - 36.0 g/dL    RDW 91.4  78.2 - 95.6 %    Platelets 322  150 - 400 K/uL DELTA CHECK NOTED  COMPREHENSIVE  METABOLIC PANEL     Status: Abnormal   Collection Time   01/14/12  4:16 AM      Component Value Range Comment   Sodium 135  135 - 145 mEq/L    Potassium 3.4 (*) 3.5 - 5.1 mEq/L    Chloride 97  96 - 112 mEq/L    CO2 28  19 - 32 mEq/L    Glucose, Bld 94  70 - 99 mg/dL    BUN 11  6 - 23 mg/dL    Creatinine, Ser 1.61  0.50 - 1.35 mg/dL    Calcium 8.6  8.4 - 09.6 mg/dL    Total Protein 6.4  6.0 - 8.3 g/dL    Albumin 2.6 (*) 3.5 - 5.2 g/dL    AST 12  0 - 37 U/L    ALT 8  0 - 53 U/L    Alkaline Phosphatase 79  39 - 117 U/L    Total Bilirubin 0.8  0.3 - 1.2 mg/dL    GFR calc non Af Amer 68 (*) >90 mL/min    GFR calc Af Amer 79 (*) >90 mL/min   GLUCOSE, CAPILLARY     Status: Normal   Collection Time   01/14/12  7:19 AM      Component Value Range Comment   Glucose-Capillary 90  70 - 99 mg/dL   TROPONIN I     Status: Normal   Collection Time   01/14/12 11:31 AM      Component Value Range Comment   Troponin I <0.30  <0.30 ng/mL   GLUCOSE, CAPILLARY     Status: Abnormal   Collection Time   01/14/12 12:33 PM      Component Value Range Comment   Glucose-Capillary 144 (*) 70 - 99 mg/dL   GLUCOSE, CAPILLARY     Status: Normal   Collection Time   01/14/12  4:39 PM      Component Value Range Comment   Glucose-Capillary 87  70 - 99 mg/dL   TROPONIN I     Status: Normal   Collection Time   01/14/12  5:09 PM      Component Value Range Comment   Troponin I <0.30  <0.30 ng/mL   GLUCOSE, CAPILLARY     Status: Normal   Collection Time   01/14/12  7:41 PM      Component Value Range Comment   Glucose-Capillary 92  70 - 99 mg/dL   TROPONIN I     Status: Normal   Collection Time   01/14/12 11:04 PM      Component Value Range Comment   Troponin I <0.30  <0.30 ng/mL   GLUCOSE, CAPILLARY     Status: Normal   Collection Time   01/14/12 11:43 PM      Component Value Range Comment    Glucose-Capillary 89  70 - 99 mg/dL   GLUCOSE, CAPILLARY     Status: Normal   Collection Time   01/15/12  3:49 AM      Component Value Range Comment   Glucose-Capillary 85  70 - 99 mg/dL   CBC     Status: Abnormal   Collection Time   01/15/12  4:38 AM      Component Value Range Comment   WBC 4.9  4.0 - 10.5 K/uL    RBC 3.13 (*) 4.22 - 5.81 MIL/uL    Hemoglobin 9.7 (*) 13.0 - 17.0 g/dL    HCT 04.5 (*) 40.9 - 52.0 %  MCV 92.0  78.0 - 100.0 fL    MCH 31.0  26.0 - 34.0 pg    MCHC 33.7  30.0 - 36.0 g/dL    RDW 16.1  09.6 - 04.5 %    Platelets 235  150 - 400 K/uL   COMPREHENSIVE METABOLIC PANEL     Status: Abnormal   Collection Time   01/15/12  4:38 AM      Component Value Range Comment   Sodium 133 (*) 135 - 145 mEq/L    Potassium 3.5  3.5 - 5.1 mEq/L    Chloride 97  96 - 112 mEq/L    CO2 28  19 - 32 mEq/L    Glucose, Bld 86  70 - 99 mg/dL    BUN 9  6 - 23 mg/dL    Creatinine, Ser 4.09  0.50 - 1.35 mg/dL    Calcium 8.2 (*) 8.4 - 10.5 mg/dL    Total Protein 5.6 (*) 6.0 - 8.3 g/dL    Albumin 2.2 (*) 3.5 - 5.2 g/dL    AST 11  0 - 37 U/L    ALT 7  0 - 53 U/L    Alkaline Phosphatase 66  39 - 117 U/L    Total Bilirubin 0.6  0.3 - 1.2 mg/dL    GFR calc non Af Amer 69 (*) >90 mL/min    GFR calc Af Amer 80 (*) >90 mL/min   GLUCOSE, CAPILLARY     Status: Abnormal   Collection Time   01/15/12  7:50 AM      Component Value Range Comment   Glucose-Capillary 123 (*) 70 - 99 mg/dL   GLUCOSE, CAPILLARY     Status: Abnormal   Collection Time   01/15/12 12:05 PM      Component Value Range Comment   Glucose-Capillary 109 (*) 70 - 99 mg/dL    Dg Chest Port 1 View  01/14/2012  *RADIOLOGY REPORT*  Clinical Data: Chest pain, shortness of breath.  PORTABLE CHEST - 1 VIEW  Comparison: 12/20/2011  Findings: Left Port-A-Cath remains in place, unchanged. Heart and mediastinal contours are within normal limits.  No focal opacities or effusions.  No acute bony abnormality.  IMPRESSION: No active  cardiopulmonary disease.   Original Report Authenticated By: Cyndie Chime, M.D.     ROS: General:no colds or fevers Skin:no rashes or ulcers HEENT:no blurred vision CV:no chest pain prior to yesterday  WJX:BJYNWGN SOB, mild GI:See HPI GU:no dysuria MS:no joint pain Neuro:no syncope, no lightheadedness Endo:no thyroid disease, + DM   Blood pressure 153/67, pulse 63, temperature 98.8 F (37.1 C), temperature source Oral, resp. rate 18, height 5\' 11"  (1.803 m), weight 88 kg (194 lb 0.1 oz), SpO2 96.00%. PE: General:alert and oriented, WM, NAD, pleasant affect. Skin:warm and dry, brisk capillary refill HEENT:normocephalic, sclera clear Neck:supple, no JVD, no bruits Heart:S1S2 regular with premature beats, no obvious murmur Lungs:clear without rales, rhonchi or wheezes Abd:+ BS, soft, non tender Ext:no edema Neuro:alert and oriented X 3, MAE, follows commands     Assessment/Plan Principal Problem:  *SBO (small bowel obstruction) Active Problems:  Rectal carcinoma  Abdominal pain  Hyponatremia  Weakness generalized  Anemia  PAF (paroxysmal atrial fibrillation)  DM (diabetes mellitus)  HTN (hypertension)  Chest pain  PLAN: Last nuc. Stress test was in 2004 with inf. Scar without ischemia.  Never had a cath.  No symptoms of ischemia. Currently no acute EKG changes.  Pt to be d/c'd tomorrow with coumadin slow drift up,unless  we prefer test in hospital..   INGOLD,LAURA R 01/15/2012, 4:07 PM

## 2012-01-15 NOTE — Consult Note (Signed)
Pt. Seen and examined. Agree with the NP/PA-C note as written.  Patient of Dr. Allyson Sabal known to me from the office, history of paroxysmal a-fib, last in 2011, with preserved LV function on echo. He had a remote stress test, but nothing recently. He describes "band-like" chest tightness across the whole chest a few days ago, relieved with nitroglycerin or perhaps oxycodone. The pain has not recurred. Troponins have been negative x 3 after the event. The EKG shows no acute changes, just PAC's and blocked PAC's. He does have multiple coronary risk factors, including DM2, HTN and age. I would recommend outpatient nuclear stress testing in our office in a few weeks after discharge.   Would recommend starting low dose aspirin 81 mg daily in addition to the warfarin. Would also provide an RX for nitroglycerin SL tabs 0.4 mg prn to take home after discharge. We will contact the patient and arrange follow-up in our office.  Thanks for the consult. Call with further questions.  Chrystie Nose, MD, [facility] Attending Cardiologist [facility]

## 2012-01-15 NOTE — Discharge Summary (Signed)
Physician Discharge Summary  Vincent [name] ZOX:[id number] DOB: [birth year] DOA: 01/10/2012  PCP: Aura Dials, MD  Admit date: 01/10/2012 Discharge date: 01/16/2012  Recommendations for Outpatient Follow-up:  1. Follow up with PMD. 2. Follow up with primary oncologist.  3. Follow up with Dr Nanetta Batty, primary cardiologist.   Discharge Diagnoses:  Principal Problem:  *SBO (small bowel obstruction) Active Problems:  Rectal carcinoma  Abdominal pain  Hyponatremia  Weakness generalized  Anemia  PAF (paroxysmal atrial fibrillation)  DM (diabetes mellitus)  HTN (hypertension)  Chest pain  Acute urinary retention   Discharge Condition: Satisfactory.   Diet recommendation: Heart-Healthy/Carbohydrate-Modified.   Filed Weights   01/10/12 2122 01/11/12 0430  Weight: 88.905 kg (196 lb) 88 kg (194 lb 0.1 oz)    History of present illness:  78 year old male with history of advanced rectal cancer, colostomy, A. fib on Coumadin, DM 2 and hypertension presented to the ED with worsening abdominal pain, nausea and vomiting without diarrhea. Imaging studies in the emergency department were suggestive of small bowel obstruction.   Hospital Course:  1. Small bowel obstruction: Etiology is suspected to be either recurrent tumor versus radiation enteritis. Patient was managed with bowel rest, NG-T, iv fluids and serial imaging studies. Surgical consultation was provided by Dr Emelia Loron, and patient subsequently improved, with resolution of abdominal pain. AXR of 01/13/12 showed stable partial small bowel obstruction since 01/12/12, improved since the CT of 01/11/12. Enema was administered on 01/13/12, and NGT-was discontinued by surgical team on the same date, without any deleterious effects. Diet was subsequently advanced, and as of 01/15/12, patient was tolerating a regular diet, with passage of flatus and stool. Clinically, bowel obstruction had resolved.  2. Anemia: Secondary to chronic  disease/malignancy and related therapies. Hemoglobin remained stable/reasonable, during this hospitalization.  3. Atrial fibrillation; Patient appears to have PAF, with controlled ventricular rate. He was initially managed with iv Digoxin, but with commencement of oral intake, the oral formulation eventually reinstated. Coumadin was temporarily placed on hold, in anticipation of invasive procedures, but was recommenced on 01/15/12.  4. Type 2 diabetes mellitus: Managed with SSI, with satisfactory control. Metformin will be restarted on discharge.  5. Advanced rectal cancer/T4 N2 disease. Patient was supposed to start chemotherapy on 01/13/12, but this was pre-empted by hospitalization. Dr. Alver Fisher service was informed of hospitalization on 01/11/12, and he will follow up on discharge.  6. Hypertension: This appeared sub-optimally controlled during patient's hospitalization. Calcium channel antagonist has been avoided, in view of bowel obstruction, so patient is now on ACE-i treatment.  7. Chest pain: Patient developed chest pain, described as "tightness" at about 10:00 AM on 01/14/12, not associated with dyspnea or diaphoresis. 12-Lead EKG showed SR, and no acute ischemic changes. He was managed with SL NTG, with resolution. Cardiac enzymes were unelevated and CXR was devoid of acute disease. Dr Zoila Shutter provided cardiology consultation, and in view of patient's multiple coronary risk factors, including DM2, HTN and age, has recommended outpatient nuclear stress testing in the office, a few weeks after discharge. He has also recommended commencement of low dose Aspirin 81 mg daily in addition to the warfarin, as well as a prescription for Nitroglycerin SL tabs 0.4 mg prn to take home after discharge. [facility] will contact the patient and arrange office follow-up. 8. Acute urinary retention: Patient had an episode of acute urinary retention during hospitalization, which was readily addressed with insertion of a  Foley catheter. Foley was discontinued on 01/15/12 without any problems, and retention  did not recur.     Procedures:  See below.   Consultations:  Dr Emelia Loron, general surgeon.  Dr Zoila Shutter, cardiologist.   Discharge Exam: Filed Vitals:   01/15/12 1755 01/15/12 2141 01/16/12 0130 01/16/12 0705  BP: 149/62 119/66 132/77 129/64  Pulse: 70 78 61 64  Temp: 98.6 F (37 C) 98 F (36.7 C) 97.8 F (36.6 C) 97.5 F (36.4 C)  TempSrc: Oral Oral Oral Oral  Resp: 18 18 18 18   Height:      Weight:      SpO2: 96% 95% 95% 97%    General: Communicative, fully oriented, not short of breath at rest.  HEENT: Mild clinical pallor, no jaundice, no conjunctival injection or discharge. Hydration is fair.  NECK: Supple, JVP not seen, no carotid bruits, no palpable lymphadenopathy, no palpable goiter.  CHEST: Clinically clear to auscultation, no wheezes, no crackles.  HEART: Sounds 1 and 2 heard, normal, regular, no murmurs.  ABDOMEN: Full, soft, non-tender, no palpable organomegaly, no palpable masses, normal bowel sounds. Has stool in Ostomy bag.  GENITALIA: Not examined.  LOWER EXTREMITIES: No pitting edema, palpable peripheral pulses.  MUSCULOSKELETAL SYSTEM: Generalized osteoarthritic changes, otherwise, normal.  CENTRAL NERVOUS SYSTEM: No focal neurologic deficit on gross examination.  Discharge Instructions      Discharge Orders    Future Appointments: Provider: Department: Dept Phone: Center:   01/27/2012 8:15 AM Windell Hummingbird [facility] [phone] None   01/27/2012 8:45 AM Myrtis Ser, NP [facility] [phone] None   01/27/2012 9:45 AM [facility] G22 [facility] [phone] None   01/29/2012 2:45 PM [facility] Flush Nurse [facility] [phone] None   02/10/2012 9:00 AM Dava Najjar Idelle Jo [facility] [phone] None   02/10/2012 9:30 AM Benjiman Core, MD [facility] [phone] None   02/10/2012 10:30 AM  [facility] C8 [facility] [phone] None   02/12/2012 2:00 PM [facility] Flush Nurse [facility] [phone] None   02/24/2012 8:15 AM Beverely Pace Shumate [facility] [phone] None   02/24/2012 8:45 AM Myrtis Ser, NP [facility] [phone] None   02/24/2012 9:45 AM [facility] A2 [facility] [phone] None   02/26/2012 1:30 PM [facility] Flush Nurse [facility] [phone] None     Future Orders Please Complete By Expires   Diet - low sodium heart healthy      Diet Carb Modified      Increase activity slowly          Medication List     As of 01/16/2012  9:02 AM    STOP taking these medications         diltiazem 30 MG tablet   Commonly known as: CARDIZEM      ondansetron 8 MG tablet   Commonly known as: ZOFRAN      TAKE these medications         albuterol-ipratropium 18-103 MCG/ACT inhaler   Commonly known as: COMBIVENT   Inhale 1 puff into the lungs every 4 (four) hours as needed. WHEEZING      aspirin EC 81 MG tablet   Take 1 tablet (81 mg total) by mouth daily.      digoxin 0.125 MG tablet   Commonly known as: LANOXIN   Take 1 tablet (125 mcg total) by mouth daily.      ferrous sulfate 325 (65 FE) MG tablet   Take 1 tablet (325 mg total) by mouth daily with breakfast.      Fluticasone-Salmeterol 500-50 MCG/DOSE Aepb  Commonly known as: ADVAIR   Inhale 1 puff into the lungs every 12 (twelve) hours.      HYDROmorphone 4 MG tablet   Commonly known as: DILAUDID   Take 4 mg by mouth every 6 (six) hours as needed. Pain      lisinopril 20 MG tablet   Commonly known as: PRINIVIL,ZESTRIL   Take 1 tablet (20 mg total) by mouth daily.      loperamide 2 MG capsule   Commonly known as: IMODIUM   Take 1 capsule (2 mg total) by mouth 4 (four) times daily as needed for diarrhea or loose stools.      metFORMIN 500 MG tablet   Commonly known as: GLUCOPHAGE   Take 500 mg by mouth daily with breakfast.       nitroGLYCERIN 0.3 MG SL tablet   Commonly known as: NITROSTAT   Place 1 tablet (0.3 mg total) under the tongue every 5 (five) minutes as needed for chest pain.      oxyCODONE 5 MG immediate release tablet   Commonly known as: Oxy IR/ROXICODONE   Take 5 mg by mouth every 6 (six) hours as needed. Breakthrough pain      pantoprazole 40 MG tablet   Commonly known as: PROTONIX   Take 1 tablet (40 mg total) by mouth daily at 12 noon.      simvastatin 40 MG tablet   Commonly known as: ZOCOR   Take 1 tablet (40 mg total) by mouth at bedtime.      warfarin 5 MG tablet   Commonly known as: COUMADIN   Take 0.5 tablets (2.5 mg total) by mouth daily. Pt takes 5 mg every day except on Monday and Friday pt takes 2.5mg  ( 1/2 tab of 5 mg tablet)      zolpidem 5 MG tablet   Commonly known as: AMBIEN   Take 1 tablet (5 mg total) by mouth at bedtime as needed for sleep (insomnia).         Follow-up Information    Follow up with Runell Gess, MD. Today. ( Our office will call you with follow up for coumadin, for stress test that you will not walk to do, and then follow up with Dr. Allyson Sabal.)    Contact information:   [address] [place] [address] [phone]       Follow up with Aura Dials, MD. (As needed)    Contact information:   [address] [place] [address] [phone]       Schedule an appointment as soon as possible for a visit with [facility], MD.   Contact information:   [address] [place] [address] [phone]           The results of significant diagnostics from this hospitalization (including imaging, microbiology, ancillary and laboratory) are listed below for reference.    Significant Diagnostic Studies: Dg Chest 1 View  12/20/2011  *RADIOLOGY REPORT*  Clinical Data: Chest pain, hypertension.  CHEST - 1 VIEW  Comparison: 12/11/2011  Findings: Left Port-A-Cath remains in place, unchanged.  Heart is normal size.  Mild  peribronchial thickening and left base atelectasis.  No effusions.  No acute bony abnormality.  IMPRESSION: Bronchitic changes.  Left base atelectasis.  Original Report Authenticated By: Cyndie Chime, M.D.   Ct Guided Abscess Drain  12/23/2011  *RADIOLOGY REPORT*  Clinical Data: Postop enlarging pelvic abscess  CT GUIDED TRANSGLUTEAL PELVIC ABSCESS DRAIN INSERTION  Date:  12/23/2011 16:35:00  Radiologist:  Judie Petit. Ruel Favors, M.D.  Medications:  2 mg Versed, 200 mcg Fentanyl  Guidance:  CT  Fluoroscopy time:  None.  Sedation time:  15 minutes  Contrast volume:  None.  Complications:  No immediate  PROCEDURE/FINDINGS:  Informed consent was obtained from the patient following explanation of the procedure, risks, benefits and alternatives. The patient understands, agrees and consents for the procedure. All questions were addressed.  A time out was performed.  Maximal barrier sterile technique utilized including caps, mask, sterile gowns, sterile gloves, large sterile drape, hand hygiene, and betadine  Previous imaging reviewed.  The patient was positioned prone. Noncontrast localization CT performed.  Pelvic fluid collection in the perirectal region localized.  Under sterile conditions and local anesthesia, an 18 gauge Hawkins needle was advanced from a left transgluteal oblique approach into the fluid collection with CT guidance.  Needle position confirmed with CT.  Syringe aspiration yielded exudative fluid.  Gram stain cultures sent. Guide wire advanced.  Guide wire position confirmed with CT imaging.  Tract dilatation performed to advance a 10-French drain catheter with the retention loop formed in the cavity.  Position confirmed with CT.  Syringe aspiration yielded 70 ml exudative fluid.  Post procedure imaging demonstrates decompression of the fluid collection.  Catheter secured with a Prolene suture and connected to external suction bulb.  Sterile dressing applied.  No immediate complication.  The patient  tolerated the procedure well.  IMPRESSION: Successful CT guided left transgluteal pelvic abscess drain insertion   Original Report Authenticated By: Judie Petit. Ruel Favors, M.D.    Ct Abdomen Pelvis W Contrast  01/11/2012  *RADIOLOGY REPORT*  Clinical Data: Severe upper abdominal pain.  Nausea and vomiting. White cell count 13.8.  History of colon cancer.  CT ABDOMEN AND PELVIS WITH CONTRAST  Technique:  Multidetector CT imaging of the abdomen and pelvis was performed following the standard protocol during bolus administration of intravenous contrast.  Contrast: OMNIPAQUE IOHEXOL 300 MG/ML  SOLN  Comparison: 01/05/2012  Findings: The lung bases are clear.  Mild pericardial effusion or thickening.  Calcification in the mitral valve annulus region.  Sub centimeter cysts in the liver appears stable since previous study.  The gallbladder is contracted.  Spleen size is normal. Adrenal glands are normal.  No retroperitoneal lymphadenopathy. Kidneys are symmetrical without hydronephrosis or solid mass. Calcification of the abdominal aorta with infrarenal abdominal aortic aneurysm measuring 4 cm diameter, stable since previous study.  The stomach is distended.  Small bowel are distended with predominately fluid.  Terminal ileum is decompressed.  Transition zone appears to be a region of thick-walled terminal ileum in the right pelvis.  Changes are consistent with small bowel obstruction. No free air or free fluid in the abdomen.  Pelvis:  Postoperative changes with a distal colonic resection and rectal stump.  Left lower quadrant descending colostomy.  Appendix is normal.  No free or loculated pelvic fluid collections.  Diffuse bladder wall thickening suggesting hypertrophy or infection.  This is stable.  Since the previous study, a left pelvic drainage catheter has been removed.  There may be a small residual fluid collection in the left presacral space extending superiorly.  This measures about 17 mm diameter and appears  stable since previous study.  IMPRESSION: Fluid distension of the small bowel with transition zone in the right pelvis representing a segment of thick-walled bowel.  Changes are consistent with small bowel obstruction.  Cause could be due to a effusions or reactive inflammatory process.  Small  residual pelvic fluid collection in the region of the previous drainage catheter.  Examination is otherwise stable.   Original Report Authenticated By: Marlon Pel, M.D.    Ct Abdomen Pelvis W Contrast  01/05/2012  *RADIOLOGY REPORT*  Clinical Data: Rectal cancer, pelvic abscess, now with left-sided pain, nausea/vomiting  CT ABDOMEN AND PELVIS WITH CONTRAST  Technique:  Multidetector CT imaging of the abdomen and pelvis was performed following the standard protocol during bolus administration of intravenous contrast.  Contrast: OMNIPAQUE IOHEXOL 300 MG/ML  SOLN  Comparison: 12/27/2011  Findings: Small bilateral pleural effusions, decreased.  Numerous hypoenhancing hepatic lesions measuring up to 8 mm, unchanged.  Spleen, pancreas, and adrenal glands are within normal limits.  Gallbladder is underdistended.  No intrahepatic or extrahepatic ductal dilatation.  1.4 cm posterior interpolar left renal cyst.  Mild left hydronephrosis, unchanged.  Right kidney is unremarkable.  No evidence of bowel obstruction.  Again seen is debris within distal small bowel (series 2/images 52 and 57), suggesting some degree of small bowel stasis.  Normal appendix.  Left lower quadrant colostomy with stable fat-containing parastomal hernia. Extensive colonic diverticulosis, without associated inflammatory changes.  Stable rectal mass.  Stable right perirectal nodal metastasis (series 2/image 64).  Left pelvic pigtail drainage catheter.  No associated residual fluid collection/abscess.  1.4 x 2.4 cm fluid collection anterior to the rectum, stable versus minimally decreased (series 2/image 70).  Bilobed infrarenal abdominal aortic  aneurysm measuring up to 3.9 cm, unchanged.  Prostate is unremarkable.  Bladder is thick-walled and/or notable for layering debris (series 2/image 65).  Degenerative changes of the visualized thoracolumbar spine.  IMPRESSION: Left pelvic drain without residual drainable fluid collection/abscess.  Additional 1.4 x 2.4 cm anterior perirectal fluid collection, stable versus minimally decreased.  Stable rectal tumor with adjacent perirectal nodal metastases, unchanged.  Bladder is mildly thick-walled and/or notable for layering debris, consider cystoscopic correlation.  Small bilateral pleural effusions, decreased.   Original Report Authenticated By: Charline Bills, M.D.    Ct Abdomen Pelvis W Contrast  12/27/2011  *RADIOLOGY REPORT*  Clinical Data: Follow up abscess drain placed 12/23/2011.  CT ABDOMEN AND PELVIS WITH CONTRAST  Technique:  Multidetector CT imaging of the abdomen and pelvis was performed following the standard protocol during bolus administration of intravenous contrast.  Contrast: OMNIPAQUE IOHEXOL 300 MG/ML  SOLN  Comparison: 12/21/2011  Findings: Moderate bilateral pleural effusions, right greater than left, mildly increased.  Associated lower lobe atelectasis.  Multiple small probable hepatic cysts, unchanged.  Spleen, pancreas, and adrenal glands are within normal limits.  Gallbladder is underdistended.  No intrahepatic or extrahepatic ductal dilatation.  1.3 cm posterior interpolar left renal cyst.  Mild fullness of the left renal collecting system.  Right kidney is unremarkable.  No frank hydronephrosis.  No evidence of bowel obstruction.  Left lower quadrant colostomy. Stable rectal mass.  Right perirectal lymph nodes, unchanged.  1.5 x 2.6 cm anterior perirectal fluid collection (series 2/image 73), previously 2.4 x 3.8 cm, decreased.  Adjacent residual 2.4 x 2.0 cm left pericolonic fluid collection with indwelling pigtail drain, previously 2.8 x 5.1 cm, improved.  Atherosclerotic  calcifications of the abdominal aorta and branch vessels.  Stable bilobed infrarenal abdominal aortic aneurysm.  Prostate and bladder are unremarkable.  Degenerative changes of the visualized thoracolumbar spine.  IMPRESSION: Residual 2.4 x 2.0 cm left pericolonic fluid collection with indwelling pigtail drain, improved.  Additional 1.5 x 2.6 cm anterior perirectal fluid collection, decreased.  Recurrent rectal tumor with adjacent perirectal  nodal metastases, unchanged.  Multiple additional stable ancillary findings as above.   Original Report Authenticated By: Charline Bills, M.D.    Ct Abdomen Pelvis W Contrast  12/22/2011  **ADDENDUM** CREATED: 12/22/2011 09:33:41  The original report was by Dr. Gaylyn Rong.  The following addendum is by Dr. Gaylyn Rong:  The following report was issued on 12/21/2011 at about 5 PM, and I discussed it by phone with Dr. Gonzella Lex.  However, I am informed today that the report is not showing up in Epic EHR.  Fortunately I still had a copy of the report in the [facility] dictation system record, and the report which was issued yesterday follows:  *RADIOLOGY REPORT*  Clinical Data: Pelvic abscess.  Rectal adenocarcinoma.  CT ABDOMEN AND PELVIS WITH CONTRAST  Technique:  Multidetector CT imaging of the abdomen and pelvis was performed following the standard protocol during bolus administration of intravenous contrast.  Contrast: OMNIPAQUE IOHEXOL 300 MG/ML  SOLN .  Comparison: Multiple exams, including 12/11/2011  Findings: Moderate bilateral pleural effusions with passive atelectasis observed.  The small pericardial effusion is present.  Scattered small hypodense lesions in the liver are sharply defined and stable compared the prior exam.  The spleen appears unremarkable.  Gastric distention noted with scattered air-fluid levels in mildly dilated loops of small bowel which demonstrate progressive dilution of contrast extending towards the pelvis.  Possible partial  small bowel obstruction due to effusion noted in the pelvis, with persistent rectal mass and fluid collection along the anterior margin of the mass eccentric to the left.  This fluid collection has increased in size, measuring 5.1 x 2.8 by 7.5 cm currently, and potentially representing an abscess.  Left colostomy noted, with diverticulosis of the colostomy segment which includes the descending and part of the sigmoid colon.  The appendix appears unremarkable.  There is a suggestion of mild pneumatosis in the cecum and ascending colon.  Mesenteric edema noted along with a small amount of fluid in the right pericolic gutter.  Thickened urinary bladder contains a Foley catheter.  There is a small amount of ascites above the urinary bladder.  Mild gallbladder wall thickening may be due to distention or inflammation.  Left mid kidney cyst noted posteriorly.  Bilobed infrarenal abdominal aortic aneurysm noted with the upper portion measuring 3.5 cm in diameter and lower portion measuring 4.3 cm in diameter.  Subcutaneous edema noted.  Presacral edema is present.  IMPRESSION:    1.  Pelvic abscess adjacent to the rectal mass has enlarged.  2.  Suspected pneumatosis in the right colon.  This is of uncertain etiology although colonic ischemia is not confidently excluded. However, the celiac trunk and SMA appear patent, as does the inferior mesenteric artery.  3.  New moderate sized bilateral pleural effusions with small pericardial effusion.  4.  Subcutaneous edema and diffuse mesenteric edema with mild ascites.  5.  Distended stomach and mildly distended small bowel may be from ileus or low grade/partial small-bowel obstruction.  Part of the ileum may be matted against the abscess and tumor in the pelvis.  6.  Bilobed infrarenal abdominal aortic aneurysm measuring up to 4.3 cm in diameter.  7.  Left colostomy with diverticulosis of the colostomy segment.  I discussed these findings by telephone with Dr. Theda Belfast Dhungel at  5:22 p.m. on 12/21/2011.  **END ADDENDUM** SIGNED BY: Soyla Murphy. Ova Freshwater, M.D.   12/21/2011  *RADIOLOGY REPORT*  Clinical Data: Pelvic abscess.  Rectal adenocarcinoma.  CT ABDOMEN AND PELVIS WITH CONTRAST  Technique:  Multidetector CT imaging of the abdomen and pelvis was performed following the standard protocol during bolus administration of intravenous contrast.  Contrast: OMNIPAQUE IOHEXOL 300 MG/ML  SOLN  Comparison: Multiple exams, including 12/11/2011  Findings: Moderate bilateral pleural effusions with passive atelectasis observed.  The small pericardial effusion is present.  Scattered small hypodense lesions in the liver are sharply defined and stable compared the prior exam.  The spleen appears unremarkable.  Gastric distention noted with scattered air-fluid levels in mildly dilated loops of small bowel which demonstrate progressive dilution of contrast extending towards the pelvis.  Possible partial small bowel obstruction due to effusion noted in the pelvis, with persistent rectal mass and fluid collection along the anterior margin of the mass eccentric to the left.  This fluid collection has increased in size, measuring 5.1 x 2.8 by 7.5 cm currently, and potentially representing an abscess.  Left colostomy noted, with diverticulosis of the colostomy segment which includes the descending and part of the sigmoid colon.  The appendix appears unremarkable.  There is a suggestion of mild pneumatosis in the cecum and ascending colon.  Mesenteric edema noted along with a small amount of fluid in the right pericolic gutter.  Thickened urinary bladder contains a Foley catheter.  There is a small amount of ascites above the urinary bladder.  Mild gallbladder wall thickening may be due to distention or inflammation.  Left mid kidney cyst noted posteriorly.  Bilobed infrarenal abdominal aortic aneurysm noted with the upper portion measuring 3.5 cm in diameter and lower portion measuring 4.3 cm in diameter.   Subcutaneous edema noted.  Presacral edema is present.  IMPRESSION:  1.  Pelvic abscess adjacent to the rectal mass has enlarged. 2.  Suspected pneumatosis in the right colon.  This is of uncertain etiology although colonic ischemia is not confidently excluded. However, the celiac trunk and SMA appear patent, as does the inferior mesenteric artery. 3.  New moderate sized bilateral pleural effusions with small pericardial effusion. 4.  Subcutaneous edema and diffuse mesenteric edema with mild ascites. 5.  Distended stomach and mildly distended small bowel may be from ileus or low grade/partial small-bowel obstruction.  Part of the ileum may be matted against the abscess and tumor in the pelvis. 6.  Bilobed infrarenal abdominal aortic aneurysm measuring up to 4.3 cm in diameter. 7.  Left colostomy with diverticulosis of the colostomy segment.  I discussed these findings by telephone with Dr. Theda Belfast Dhungel at 5:22 p.m. on 12/21/2011.  Original Report Authenticated By: Dellia Cloud, M.D.    Dg Chest Port 1 View  01/14/2012  *RADIOLOGY REPORT*  Clinical Data: Chest pain, shortness of breath.  PORTABLE CHEST - 1 VIEW  Comparison: 12/20/2011  Findings: Left Port-A-Cath remains in place, unchanged. Heart and mediastinal contours are within normal limits.  No focal opacities or effusions.  No acute bony abnormality.  IMPRESSION: No active cardiopulmonary disease.   Original Report Authenticated By: Cyndie Chime, M.D.    Dg Abd 2 Views  01/13/2012  *RADIOLOGY REPORT*  Clinical Data: Periumbilical abdominal pain.  Follow up small bowel obstruction.  ABDOMEN - 2 VIEW  Comparison: Two-view abdomen x-ray yesterday.  CT abdomen and pelvis 01/11/2012.  Findings: Persistent dilation of several loops of small bowel in the mid abdomen, not significantly changed since yesterday, though improved since the CT 2 days ago.  Residual CT oral contrast material throughout normal caliber colon.  A nasogastric tube tip in the  stomach.  No evidence of  free air on the erect image. Phleboliths in the left side of the pelvis.  No visible opaque urinary tract calculi.  IMPRESSION: Stable partial small bowel obstruction since yesterday, improved since the CT 2 days ago.  No free intraperitoneal air.   Original Report Authenticated By: Arnell Sieving, M.D.    Dg Abd 2 Views  01/12/2012  *RADIOLOGY REPORT*  Clinical Data: Abdominal pain, small bowel obstruction  ABDOMEN - 2 VIEW  Comparison: 01/11/2012  Findings: Similar degree of retained contrast and stool throughout the colon.  Mid abdominal small bowel dilatation persist with air- fluid levels.  NG tube in the stomach fundus.  Stomach is decompressed.  No definite free air.  Lung bases clear.  Normal heart size.  Degenerative changes of the spine.  IMPRESSION: Stable small bowel obstruction pattern.  Little interval change.   Original Report Authenticated By: Judie Petit. Ruel Favors, M.D.    Dg Abd 2 Views  12/20/2011  *RADIOLOGY REPORT*  Clinical Data: Abdominal pain.  ABDOMEN - 2 VIEW  Comparison: CT abdomen 12/11/2011  Findings: Marked gaseous distension of the stomach.  Moderate gaseous distension of small bowel.  Air-fluid levels on the decubitus view.  Changes are consistent with small bowel obstruction.  Some small bowel loops appear to demonstrate wall thickening.  This could be due to edema or inflammatory process. No free intra-abdominal air.  Degenerative changes in the spine.  IMPRESSION: Gaseous distension of stomach and small bowel suggesting small bowel obstruction.   Small bowel wall thickening suggested in the mid abdomen.  Original Report Authenticated By: Marlon Pel, M.D.   Dg Abd Acute W/chest  01/10/2012  *RADIOLOGY REPORT*  Clinical Data: Abdominal pain.  Rectal cancer and pelvic abscess.  ACUTE ABDOMEN SERIES (ABDOMEN 2 VIEW & CHEST 1 VIEW)  Comparison: Abdomen and pelvis CT dated 01/05/2012. Chest CT dated 03/2012.  Portable chest dated 12/20/2011.   Findings: Left subclavian porta catheter tip in the superior vena cava.  Normal sized heart.  Left nipple shadow, confirmed on the CT dated 12/13/2011. The lungs are mildly hyperexpanded with mild peribronchial thickening and minimally prominent interstitial markings.  CT oral contrast in the colon.  The colon is normal in caliber. Mildly dilated small bowel loops.  Air-fluid levels in the small bowel and stomach.  A small amount of air medial to the gastric air bubble is most likely in a medial recess of the stomach.  No free peritoneal air beneath the hemidiaphragms.  Lumbar spine degenerative changes and mild scoliosis.  IMPRESSION:  1.  Interval small bowel ileus or partial obstruction. 2.  Mild changes of COPD and chronic bronchitis.   Original Report Authenticated By: Darrol Angel, M.D.     Microbiology: No results found for this or any previous visit (from the past 240 hour(s)).   Labs: Basic Metabolic Panel:  Lab 01/15/12 5621 01/14/12 0416 01/13/12 0345 01/12/12 0415 01/11/12 0515  NA 133* 135 135 136 136  K 3.5 3.4* 3.5 4.0 4.1  CL 97 97 99 99 97  CO2 28 28 32 30 31  GLUCOSE 86 94 93 130* 139*  BUN 9 11 12 10 10   CREATININE 1.02 1.03 3.08 1.12 1.18  CALCIUM 8.2* 8.6 8.3* 8.4 8.7  MG -- -- -- -- --  PHOS -- -- -- -- --   Liver Function Tests:  Lab 01/15/12 0438 01/14/12 0416 01/10/12 2200  AST 11 12 15   ALT 7 8 16   ALKPHOS 66 79 98  BILITOT 0.6  0.8 0.6  PROT 5.6* 6.4 8.2  ALBUMIN 2.2* 2.6* 3.3*    Lab 01/10/12 2200  LIPASE 12  AMYLASE --   No results found for this basename: AMMONIA:5 in the last 168 hours CBC:  Lab 01/15/12 0438 01/14/12 0416 01/13/12 0345 01/12/12 0415 01/11/12 0515 01/10/12 2200  WBC 4.9 7.8 5.7 6.5 6.7 --  NEUTROABS -- -- -- -- -- 12.4*  HGB 9.7* 11.0* 9.6* 10.0* 9.7* --  HCT 28.8* 33.5* 29.4* 32.5* 30.3* --  MCV 92.0 93.3 95.5 97.0 95.3 --  PLT 235 322 238 261 289 --   Cardiac Enzymes:  Lab 01/14/12 2304 01/14/12 1709 01/14/12 1131    CKTOTAL -- -- --  CKMB -- -- --  CKMBINDEX -- -- --  TROPONINI <0.30 <0.30 <0.30   BNP: BNP (last 3 results) No results found for this basename: PROBNP:3 in the last 8760 hours CBG:  Lab 01/16/12 0750 01/16/12 0410 01/15/12 2355 01/15/12 1947 01/15/12 1651  GLUCAP 107* 109* 126* 133* 127*    Time coordinating discharge: 40 minutes  Signed:  Joanann Mies,CHRISTOPHER  [facility] 01/16/2012, 9:02 AM

## 2012-01-16 LAB — GLUCOSE, CAPILLARY
Glucose-Capillary: 109 mg/dL — ABNORMAL HIGH (ref 70–99)
Glucose-Capillary: 126 mg/dL — ABNORMAL HIGH (ref 70–99)

## 2012-01-16 LAB — PROTIME-INR
INR: 1.33 (ref 0.00–1.49)
Prothrombin Time: 16.7 seconds — ABNORMAL HIGH (ref 11.6–15.2)

## 2012-01-16 MED ORDER — LISINOPRIL 20 MG PO TABS: 20.0000 mg | ORAL_TABLET | Freq: Every day | ORAL | Status: DC

## 2012-01-16 MED ORDER — ASPIRIN EC 81 MG PO TBEC: 81.0000 mg | DELAYED_RELEASE_TABLET | Freq: Every day | ORAL | Status: DC

## 2012-01-16 MED ORDER — NITROGLYCERIN 0.3 MG SL SUBL: 0.3000 mg | SUBLINGUAL_TABLET | SUBLINGUAL | Status: DC | PRN

## 2012-01-16 NOTE — Progress Notes (Signed)
  Subjective: Tolerating diet and colostomy working.  Objective: Vital signs in last 24 hours: Temp:  [97.5 F (36.4 C)-98.8 F (37.1 C)] 97.5 F (36.4 C) (09/14 0705) Pulse Rate:  [61-78] 64  (09/14 0705) Resp:  [18] 18  (09/14 0705) BP: (119-153)/(62-83) 129/64 mmHg (09/14 0705) SpO2:  [95 %-97 %] 97 % (09/14 0705) Last BM Date: 01/15/12  Intake/Output from previous day: 09/13 0701 - 09/14 0700 In: 1490 [P.O.:240; I.V.:1250] Out: 2425 [Urine:1875; Stool:550] Intake/Output this shift:    PE: Abd-soft, stool and gas in colostomy bag  Lab Results:   Basename 01/15/12 0438 01/14/12 0416  WBC 4.9 7.8  HGB 9.7* 11.0*  HCT 28.8* 33.5*  PLT 235 322   BMET  Basename 01/15/12 0438 01/14/12 0416  NA 133* 135  K 3.5 3.4*  CL 97 97  CO2 28 28  GLUCOSE 86 94  BUN 9 11  CREATININE 1.02 1.03  CALCIUM 8.2* 8.6   PT/INR  Basename 01/16/12 0413  LABPROT 16.7*  INR 1.33   Comprehensive Metabolic Panel:    Component Value Date/Time   NA 133* 01/15/2012 0438   NA 135* 01/05/2012 0907   NA 133 04/09/2011 0838   K 3.5 01/15/2012 0438   K 3.8 01/05/2012 0907   K 4.6 04/09/2011 0838   CL 97 01/15/2012 0438   CL 98 01/05/2012 0907   CL 98 04/09/2011 0838   CO2 28 01/15/2012 0438   CO2 28 01/05/2012 0907   CO2 31 04/09/2011 0838   BUN 9 01/15/2012 0438   BUN 13.0 01/05/2012 0907   BUN 18 04/09/2011 0838   CREATININE 1.02 01/15/2012 0438   CREATININE 1.1 01/05/2012 0907   CREATININE 1.3* 04/09/2011 0838   GLUCOSE 86 01/15/2012 0438   GLUCOSE 155* 01/05/2012 0907   GLUCOSE 136* 04/09/2011 0838   CALCIUM 8.2* 01/15/2012 0438   CALCIUM 8.8 04/09/2011 0838   AST 11 01/15/2012 0438   AST 22 01/05/2012 0907   AST 18 04/09/2011 0838   ALT 7 01/15/2012 0438   ALT 21 01/05/2012 0907   ALKPHOS 66 01/15/2012 0438   ALKPHOS 66 01/05/2012 0907   ALKPHOS 67 04/09/2011 0838   BILITOT 0.6 01/15/2012 0438   BILITOT 0.50 01/05/2012 0907   BILITOT 0.70 04/09/2011 0838   PROT 5.6* 01/15/2012 0438   PROT 5.9* 01/05/2012  0907   PROT 6.5 04/09/2011 0838   ALBUMIN 2.2* 01/15/2012 0438   ALBUMIN 2.5* 01/05/2012 1610     Studies/Results: Dg Chest Port 1 View  01/14/2012  *RADIOLOGY REPORT*  Clinical Data: Chest pain, shortness of breath.  PORTABLE CHEST - 1 VIEW  Comparison: 12/20/2011  Findings: Left Port-A-Cath remains in place, unchanged. Heart and mediastinal contours are within normal limits.  No focal opacities or effusions.  No acute bony abnormality.  IMPRESSION: No active cardiopulmonary disease.   Original Report Authenticated By: Cyndie Chime, M.D.     Anti-infectives: Anti-infectives    None      Assessment Principal Problem:  *SBO (small bowel obstruction)-resolved.    LOS: 6 days   Plan: Okay for discharge from surgery standpoint.   Donnivan Villena J 01/16/2012

## 2012-01-16 NOTE — Progress Notes (Signed)
Assessment unchanged. Pt verbalized understanding of dc instructions including meds to resume, diet, activity and follow-up care using teach back. Scripts x3 given as provided by MD. Wife at bedside also verbalized understanding. Discharged via wc to front entrance to meet awaiting vehicle to carry home. Accompanied by wife, friend and NT.

## 2012-01-27 ENCOUNTER — Ambulatory Visit (HOSPITAL_BASED_OUTPATIENT_CLINIC_OR_DEPARTMENT_OTHER): Payer: Medicare Other

## 2012-01-27 ENCOUNTER — Other Ambulatory Visit (HOSPITAL_BASED_OUTPATIENT_CLINIC_OR_DEPARTMENT_OTHER): Payer: Medicare Other | Admitting: Lab

## 2012-01-27 ENCOUNTER — Encounter: Payer: Self-pay | Admitting: Oncology

## 2012-01-27 ENCOUNTER — Ambulatory Visit (HOSPITAL_BASED_OUTPATIENT_CLINIC_OR_DEPARTMENT_OTHER): Payer: Medicare Other | Admitting: Oncology

## 2012-01-27 ENCOUNTER — Telehealth: Payer: Self-pay | Admitting: Oncology

## 2012-01-27 VITALS — BP 115/61 | HR 78 | Temp 96.8°F | Resp 20 | Ht 71.0 in | Wt 187.6 lb

## 2012-01-27 DIAGNOSIS — I4891 Unspecified atrial fibrillation: Secondary | ICD-10-CM

## 2012-01-27 DIAGNOSIS — C20 Malignant neoplasm of rectum: Secondary | ICD-10-CM

## 2012-01-27 DIAGNOSIS — D649 Anemia, unspecified: Secondary | ICD-10-CM

## 2012-01-27 DIAGNOSIS — Z5111 Encounter for antineoplastic chemotherapy: Secondary | ICD-10-CM

## 2012-01-27 DIAGNOSIS — E46 Unspecified protein-calorie malnutrition: Secondary | ICD-10-CM

## 2012-01-27 LAB — CBC WITH DIFFERENTIAL/PLATELET
EOS%: 2 % (ref 0.0–7.0)
Eosinophils Absolute: 0.1 10*3/uL (ref 0.0–0.5)
LYMPH%: 12.1 % — ABNORMAL LOW (ref 14.0–49.0)
MCH: 30.3 pg (ref 27.2–33.4)
MCHC: 32.2 g/dL (ref 32.0–36.0)
MCV: 94.1 fL (ref 79.3–98.0)
MONO%: 7.8 % (ref 0.0–14.0)
Platelets: 195 10*3/uL (ref 140–400)
RBC: 3.73 10*6/uL — ABNORMAL LOW (ref 4.20–5.82)
RDW: 15.5 % — ABNORMAL HIGH (ref 11.0–14.6)
nRBC: 0 % (ref 0–0)

## 2012-01-27 LAB — COMPREHENSIVE METABOLIC PANEL (CC13)
ALT: 9 U/L (ref 0–55)
Alkaline Phosphatase: 82 U/L (ref 40–150)
CO2: 28 mEq/L (ref 22–29)
Creatinine: 0.9 mg/dL (ref 0.7–1.3)
Sodium: 139 mEq/L (ref 136–145)
Total Bilirubin: 0.6 mg/dL (ref 0.20–1.20)

## 2012-01-27 MED ORDER — SODIUM CHLORIDE 0.9 % IV SOLN
Freq: Once | INTRAVENOUS | Status: AC
  Administered 2012-01-27: 10:00:00 via INTRAVENOUS

## 2012-01-27 MED ORDER — ONDANSETRON 16 MG/50ML IVPB (CHCC)
16.0000 mg | Freq: Once | INTRAVENOUS | Status: AC
  Administered 2012-01-27: 16 mg via INTRAVENOUS

## 2012-01-27 MED ORDER — LEUCOVORIN CALCIUM INJECTION 350 MG
300.0000 mg/m2 | Freq: Once | INTRAVENOUS | Status: AC
  Administered 2012-01-27: 634 mg via INTRAVENOUS
  Filled 2012-01-27: qty 31.7

## 2012-01-27 MED ORDER — IRINOTECAN HCL CHEMO INJECTION 100 MG/5ML
135.0000 mg/m2 | Freq: Once | INTRAVENOUS | Status: AC
  Administered 2012-01-27: 284 mg via INTRAVENOUS
  Filled 2012-01-27: qty 14.2

## 2012-01-27 MED ORDER — FLUOROURACIL CHEMO INJECTION 2.5 GM/50ML
300.0000 mg/m2 | Freq: Once | INTRAVENOUS | Status: AC
  Administered 2012-01-27: 650 mg via INTRAVENOUS
  Filled 2012-01-27: qty 13

## 2012-01-27 MED ORDER — DEXAMETHASONE SODIUM PHOSPHATE 4 MG/ML IJ SOLN
20.0000 mg | Freq: Once | INTRAMUSCULAR | Status: AC
  Administered 2012-01-27: 20 mg via INTRAVENOUS

## 2012-01-27 MED ORDER — SODIUM CHLORIDE 0.9 % IV SOLN
1800.0000 mg/m2 | INTRAVENOUS | Status: DC
  Administered 2012-01-27: 3800 mg via INTRAVENOUS
  Filled 2012-01-27: qty 76

## 2012-01-27 NOTE — Patient Instructions (Addendum)
Constipation:  Take Miralax 1 capful mixed in water or juice daily. Use Colace (docusate sodium) twice a day as a stool softener. Both of these medications are available over the counter.

## 2012-01-27 NOTE — Patient Instructions (Signed)
[facility] Discharge Instructions for Patients Receiving Chemotherapy  Today you received the following chemotherapy agents 5 Fu/Leucovorin/Irinotecan To help prevent nausea and vomiting after your treatment, we encourage you to take your nausea medication as prescribed.   If you develop nausea and vomiting that is not controlled by your nausea medication, call the clinic. If it is after clinic hours your family physician or the after hours number for the clinic or go to the Emergency Department.   BELOW ARE SYMPTOMS THAT SHOULD BE REPORTED IMMEDIATELY:  *FEVER GREATER THAN 100.5 F  *CHILLS WITH OR WITHOUT FEVER  NAUSEA AND VOMITING THAT IS NOT CONTROLLED WITH YOUR NAUSEA MEDICATION  *UNUSUAL SHORTNESS OF BREATH  *UNUSUAL BRUISING OR BLEEDING  TENDERNESS IN MOUTH AND THROAT WITH OR WITHOUT PRESENCE OF ULCERS  *URINARY PROBLEMS  *BOWEL PROBLEMS  UNUSUAL RASH Items with * indicate a potential emergency and should be followed up as soon as possible.  One of the nurses will contact you 24 hours after your treatment. Please let the nurse know about any problems that you may have experienced. Feel free to call the clinic you have any questions or concerns. The clinic phone number is [phone].   I have been informed and understand all the instructions given to me. I know to contact the clinic, my physician, or go to the Emergency Department if any problems should occur. I do not have any questions at this time, but understand that I may call the clinic during office hours   should I have any questions or need assistance in obtaining follow up care.    __________________________________________  _____________  __________ Signature of Patient or Authorized Representative            Date                   Time    __________________________________________ Nurse's Signature    

## 2012-01-27 NOTE — Telephone Encounter (Signed)
family member stated that pt was not there.....mailed OCT 2013 schedule to pt....sed.

## 2012-01-27 NOTE — Progress Notes (Signed)
Hematology and Oncology Follow Up Visit  Vincent [name] [id number] 1933/03/03 78 y.o. 01/27/2012 9:37 AM  CC: Vincent Heckler, MD  Vincent [name], M.D.  Vincent Boop, MD,FACG  Vincent [name], Ph.D., M.D.    Principle Diagnosis: This is a 78 year old gentleman diagnosed with adenocarcinoma of the rectum.  He had T4a N2 disease diagnosed in 2011.  He presented obstruction of the distal rectal area. Now has local relapse.    Prior Therapy: 1. Status post rectosigmoid resection and segmental resection on February 13, 2010.  Tumor was poorly differentiated adenocarcinoma, 8 out of 16 lymph nodes involved, tumor not completely resected at that time.  2. The patient received radiation therapy adamantly concomitantly with Xeloda.  Therapy concluded in January 2012.  3. Treated with adjuvant FOLFOX for a total of 7 cycles of therapy.  The therapy concluded Sep 16, 2010.  Current therapy: Due to begin FOLFIRI today salvage purposes.   Interim History:  Vincent [name] presents today for an office followup visit. He had prolonged hospitalizations for abdominal pain and found to have relpased disease with pelvic lymph nodes. He also had an abscesses that was drained successfully. He was again hospitalized earlier this month for a small bowel obstruction. SBO thought to be due to recurrent tumor vs. Radiation enteritis. SBO was managed medically. He is still fatigued since hospital discharge.  He has not had any abdominal pain. No nausea or vomiting. He reports that he has been constipated. Not using nay laxatives or stool softeners at this time. Continue to have peripheral neuropathy that is unchanged. He denies any headaches, any visual changes, any dysphagia, odynophagia, any nausea, vomiting, diarrhea, constipation, chest pain, shortness of breath, productive cough, abdominal pain, abdominal swelling, lower extremity paresthesia, bowel or bladder incontinence, any obvious bleeding such as melena, hematochezia, or  hematuria.  He denies any fevers, chills, or night sweats. Appetite is decreased and he has lost about 9 lbs since his last visit with [place] in early September.  Medications: I have reviewed the patient's current medications. Current outpatient prescriptions:albuterol-ipratropium (COMBIVENT) 18-103 MCG/ACT inhaler, Inhale 1 puff into the lungs every 4 (four) hours as needed. WHEEZING, Disp: , Rfl: ;  aspirin EC 81 MG tablet, Take 1 tablet (81 mg total) by mouth daily., Disp: 100 tablet, Rfl: 0;  digoxin (LANOXIN) 0.125 MG tablet, Take 1 tablet (125 mcg total) by mouth daily., Disp: 30 tablet, Rfl: 0 ferrous sulfate 325 (65 FE) MG tablet, Take 1 tablet (325 mg total) by mouth daily with breakfast., Disp: 30 tablet, Rfl: 0;  Fluticasone-Salmeterol (ADVAIR DISKUS) 500-50 MCG/DOSE AEPB, Inhale 1 puff into the lungs every 12 (twelve) hours., Disp: 60 each, Rfl: 3;  HYDROmorphone (DILAUDID) 4 MG tablet, Take 4 mg by mouth every 6 (six) hours as needed. Pain, Disp: , Rfl:  lisinopril (PRINIVIL,ZESTRIL) 20 MG tablet, Take 1 tablet (20 mg total) by mouth daily., Disp: 30 tablet, Rfl: 0;  metFORMIN (GLUCOPHAGE) 500 MG tablet, Take 500 mg by mouth daily with breakfast. , Disp: , Rfl: ;  nitroGLYCERIN (NITROSTAT) 0.3 MG SL tablet, Place 1 tablet (0.3 mg total) under the tongue every 5 (five) minutes as needed for chest pain., Disp: 90 tablet, Rfl: 0 oxyCODONE (OXY IR/ROXICODONE) 5 MG immediate release tablet, Take 5 mg by mouth every 6 (six) hours as needed. Breakthrough pain, Disp: , Rfl: ;  pantoprazole (PROTONIX) 40 MG tablet, Take 1 tablet (40 mg total) by mouth daily at 12 noon., Disp: 30 tablet, Rfl: 0;  simvastatin (ZOCOR) 40 MG tablet, Take 1 tablet (40 mg total) by mouth at bedtime., Disp: 30 tablet, Rfl: 0 warfarin (COUMADIN) 5 MG tablet, Take 0.5 tablets (2.5 mg total) by mouth daily. Pt takes 5 mg every day except on Monday and Friday pt takes 2.5mg  ( 1/2 tab of 5 mg tablet), Disp: 30 tablet, Rfl: 0;  zolpidem  (AMBIEN) 5 MG tablet, Take 1 tablet (5 mg total) by mouth at bedtime as needed for sleep (insomnia)., Disp: 30 tablet, Rfl: 0  Allergies: No Known Allergies  Past Medical History, Surgical history, Social history, and Family History were reviewed and updated.  Review of Systems: Constitutional:  Negative for fever, chills, night sweats, anorexia, weight loss, pain. Cardiovascular: no chest pain or dyspnea on exertion Respiratory: no cough, shortness of breath, or wheezing Neurological: no TIA or stroke symptoms Dermatological: negative ENT: negative Skin: Negative. Gastrointestinal: no abdominal pain, change in bowel habits, or black or bloody stools Genito-Urinary: no dysuria, trouble voiding, or hematuria Hematological and Lymphatic: negative Breast: negative Musculoskeletal: negative Remaining ROS negative.  Physical Exam: Blood pressure 115/61, pulse 78, temperature 96.8 F (36 C), temperature source Oral, resp. rate 20, height 5\' 11"  (1.803 m), weight 187 lb 9.6 oz (85.095 kg). ECOG: 1-2 General appearance: alert Head: Normocephalic, without obvious abnormality, atraumatic Neck: no adenopathy, no carotid bruit, no JVD, supple, symmetrical, trachea midline and thyroid not enlarged, symmetric, no tenderness/mass/nodules Lymph nodes: Cervical, supraclavicular, and axillary nodes normal. Heart:regular rate and rhythm, S1, S2 normal, no murmur, click, rub or gallop Lung:chest clear, no wheezing, rales, normal symmetric air entry Abdomen: soft, non-tender, without masses or organomegaly EXT:no erythema, induration, or nodules   Lab Results: Lab Results  Component Value Date   WBC 7.0 01/27/2012   HGB 11.3* 01/27/2012   HCT 35.1* 01/27/2012   MCV 94.1 01/27/2012   PLT 195 01/27/2012    Impression and Plan: This is a pleasant 78 year old gentleman with the following issues:   1. Advanced rectal carcinoma, presented with a T4 N2 disease.  He presented with a complete  obstruction.  After segmental resection, he underwent radiation therapy with Xeloda and subsequently systemic chemotherapy.  At this time, he has  evidence of  recurrent disease with pelvic lymph nodes. I discussed the risks and benefits of salvage chemotherapy in the form of FOLFIRI. He is agreeable to proceed. Complications include myelosuppression, diarrhea, and infusion related toxicities. Will reduce his chemotherapy drugs by 25% due to performance status. Plan is to give him 2 months of chemotherapy and then repeat a CT scan.  2. Anemia. Likely multi-factorial. He has no active bleeding. No transfusion is indicated. He remains on ferrous sulfate. 3. Constipation: I have given him written instructions about the use of Miralax and stool softeners. 4. Protein calorie malnutrition: Appetite is decreased and he has lost more weight. I have referred him to the dietician. May need appetite stimulants if he has continued weight loss. 5. Port-A-Cath management. This will be used for chemotherapy.   6. Atrial fibrillation. On Digoxin and anticoagulated with Coumadin. 7. Hypertension.  On lisinopril. 8. DM. On Metformin. 9. Followup: 2 weeks for cycle 2.   [place], [place] 9/25/20139:37 AM

## 2012-01-29 ENCOUNTER — Ambulatory Visit (HOSPITAL_BASED_OUTPATIENT_CLINIC_OR_DEPARTMENT_OTHER): Payer: Medicare Other

## 2012-01-29 VITALS — BP 119/62 | HR 77 | Temp 97.5°F

## 2012-01-29 DIAGNOSIS — C20 Malignant neoplasm of rectum: Secondary | ICD-10-CM

## 2012-01-29 MED ORDER — HEPARIN SOD (PORK) LOCK FLUSH 100 UNIT/ML IV SOLN
500.0000 [IU] | Freq: Once | INTRAVENOUS | Status: AC | PRN
  Administered 2012-01-29: 500 [IU]
  Filled 2012-01-29: qty 5

## 2012-01-29 MED ORDER — SODIUM CHLORIDE 0.9 % IJ SOLN
10.0000 mL | INTRAMUSCULAR | Status: DC | PRN
  Administered 2012-01-29: 10 mL
  Filled 2012-01-29: qty 10

## 2012-01-29 NOTE — Patient Instructions (Signed)
Call MD with any questions 

## 2012-02-10 ENCOUNTER — Ambulatory Visit (HOSPITAL_BASED_OUTPATIENT_CLINIC_OR_DEPARTMENT_OTHER): Payer: Medicare Other | Admitting: Oncology

## 2012-02-10 ENCOUNTER — Telehealth: Payer: Self-pay | Admitting: Oncology

## 2012-02-10 ENCOUNTER — Other Ambulatory Visit (HOSPITAL_BASED_OUTPATIENT_CLINIC_OR_DEPARTMENT_OTHER): Payer: Medicare Other | Admitting: Lab

## 2012-02-10 ENCOUNTER — Ambulatory Visit (HOSPITAL_BASED_OUTPATIENT_CLINIC_OR_DEPARTMENT_OTHER): Payer: Medicare Other

## 2012-02-10 ENCOUNTER — Telehealth: Payer: Self-pay | Admitting: *Deleted

## 2012-02-10 VITALS — Ht 71.0 in | Wt 188.8 lb

## 2012-02-10 VITALS — BP 114/63 | HR 72 | Temp 96.8°F

## 2012-02-10 DIAGNOSIS — Z5111 Encounter for antineoplastic chemotherapy: Secondary | ICD-10-CM

## 2012-02-10 DIAGNOSIS — E46 Unspecified protein-calorie malnutrition: Secondary | ICD-10-CM

## 2012-02-10 DIAGNOSIS — C19 Malignant neoplasm of rectosigmoid junction: Secondary | ICD-10-CM

## 2012-02-10 DIAGNOSIS — C20 Malignant neoplasm of rectum: Secondary | ICD-10-CM

## 2012-02-10 DIAGNOSIS — D649 Anemia, unspecified: Secondary | ICD-10-CM

## 2012-02-10 DIAGNOSIS — I4891 Unspecified atrial fibrillation: Secondary | ICD-10-CM

## 2012-02-10 LAB — CBC WITH DIFFERENTIAL/PLATELET
Eosinophils Absolute: 0.1 10*3/uL (ref 0.0–0.5)
HCT: 31.3 % — ABNORMAL LOW (ref 38.4–49.9)
LYMPH%: 11.2 % — ABNORMAL LOW (ref 14.0–49.0)
MCHC: 33.8 g/dL (ref 32.0–36.0)
MCV: 93.8 fL (ref 79.3–98.0)
MONO#: 0.3 10*3/uL (ref 0.1–0.9)
MONO%: 8.6 % (ref 0.0–14.0)
NEUT#: 2.9 10*3/uL (ref 1.5–6.5)
NEUT%: 76.8 % — ABNORMAL HIGH (ref 39.0–75.0)
Platelets: 188 10*3/uL (ref 140–400)
WBC: 3.8 10*3/uL — ABNORMAL LOW (ref 4.0–10.3)

## 2012-02-10 LAB — COMPREHENSIVE METABOLIC PANEL (CC13)
CO2: 25 mEq/L (ref 22–29)
Creatinine: 1 mg/dL (ref 0.7–1.3)
Glucose: 157 mg/dl — ABNORMAL HIGH (ref 70–99)
Total Bilirubin: 0.5 mg/dL (ref 0.20–1.20)

## 2012-02-10 MED ORDER — SODIUM CHLORIDE 0.9 % IJ SOLN
10.0000 mL | INTRAMUSCULAR | Status: DC | PRN
  Filled 2012-02-10: qty 10

## 2012-02-10 MED ORDER — LEUCOVORIN CALCIUM INJECTION 350 MG
630.0000 mg | Freq: Once | INTRAVENOUS | Status: AC
  Administered 2012-02-10: 630 mg via INTRAVENOUS
  Filled 2012-02-10: qty 31.5

## 2012-02-10 MED ORDER — ONDANSETRON 16 MG/50ML IVPB (CHCC)
16.0000 mg | Freq: Once | INTRAVENOUS | Status: AC
  Administered 2012-02-10: 16 mg via INTRAVENOUS

## 2012-02-10 MED ORDER — FLUOROURACIL CHEMO INJECTION 2.5 GM/50ML
300.0000 mg/m2 | Freq: Once | INTRAVENOUS | Status: AC
  Administered 2012-02-10: 650 mg via INTRAVENOUS
  Filled 2012-02-10: qty 13

## 2012-02-10 MED ORDER — DEXAMETHASONE SODIUM PHOSPHATE 4 MG/ML IJ SOLN
20.0000 mg | Freq: Once | INTRAMUSCULAR | Status: AC
  Administered 2012-02-10: 20 mg via INTRAVENOUS

## 2012-02-10 MED ORDER — SODIUM CHLORIDE 0.9 % IV SOLN
1800.0000 mg/m2 | INTRAVENOUS | Status: DC
  Administered 2012-02-10: 3800 mg via INTRAVENOUS
  Filled 2012-02-10: qty 76

## 2012-02-10 MED ORDER — IRINOTECAN HCL CHEMO INJECTION 100 MG/5ML
135.0000 mg/m2 | Freq: Once | INTRAVENOUS | Status: AC
  Administered 2012-02-10: 284 mg via INTRAVENOUS
  Filled 2012-02-10: qty 14.2

## 2012-02-10 MED ORDER — SODIUM CHLORIDE 0.9 % IV SOLN
Freq: Once | INTRAVENOUS | Status: AC
  Administered 2012-02-10: 10:00:00 via INTRAVENOUS

## 2012-02-10 MED ORDER — TEMAZEPAM 15 MG PO CAPS: 15.0000 mg | ORAL_CAPSULE | Freq: Every evening | ORAL | Status: DC | PRN

## 2012-02-10 NOTE — Telephone Encounter (Signed)
Scheduled patient appt for November and December 2013 lab and MD , emailed [place] regarding chemo , he will come back and get his calendar

## 2012-02-10 NOTE — Telephone Encounter (Signed)
Called pt and left message regarding appt for month of October and November advised patient to get calendar on 02/12/12

## 2012-02-10 NOTE — Patient Instructions (Addendum)
[facility] Discharge Instructions for Patients Receiving Chemotherapy  Today you received the following chemotherapy agents 5FU, Irinotecan, Leucovorin  To help prevent nausea and vomiting after your treatment, we encourage you to take your nausea medication as directed by your MD.  If you develop nausea and vomiting that is not controlled by your nausea medication, call the clinic. If it is after clinic hours your family physician or the after hours number for the clinic or go to the Emergency Department.   BELOW ARE SYMPTOMS THAT SHOULD BE REPORTED IMMEDIATELY:  *FEVER GREATER THAN 100.5 F  *CHILLS WITH OR WITHOUT FEVER  NAUSEA AND VOMITING THAT IS NOT CONTROLLED WITH YOUR NAUSEA MEDICATION  *UNUSUAL SHORTNESS OF BREATH  *UNUSUAL BRUISING OR BLEEDING  TENDERNESS IN MOUTH AND THROAT WITH OR WITHOUT PRESENCE OF ULCERS  *URINARY PROBLEMS  *BOWEL PROBLEMS  UNUSUAL RASH Items with * indicate a potential emergency and should be followed up as soon as possible.   Feel free to call the clinic you have any questions or concerns. The clinic phone number is [phone].

## 2012-02-10 NOTE — Progress Notes (Signed)
Hematology and Oncology Follow Up Visit  Vincent [name] [id number] March 12, 1933 78 y.o. 02/10/2012 9:50 AM  CC: Vincent Heckler, MD  Vincent [name], M.D.  Vincent Boop, MD,FACG  Vincent [name], Ph.D., M.D.    Principle Diagnosis: This is a 78 year old gentleman diagnosed with adenocarcinoma of the rectum.  He had T4a N2 disease diagnosed in 2011.  He presented obstruction of the distal rectal area. Now has local relapse.    Prior Therapy: 1. Status post rectosigmoid resection and segmental resection on February 13, 2010.  Tumor was poorly differentiated adenocarcinoma, 8 out of 16 lymph nodes involved, tumor not completely resected at that time.  2. The patient received radiation therapy adamantly concomitantly with Xeloda.  Therapy concluded in January 2012.  3. Treated with adjuvant FOLFOX for a total of 7 cycles of therapy.  The therapy concluded Sep 16, 2010.  Current therapy: FOLFIRI for salvage purposes. He is S/P first cycle on 9/25. He is here for cycle 2.   Interim History:  Vincent [name] presents today for an office followup visit. He tolerated the first cycle without complications. He is still fatigued since hospital discharge but not any worse. He has not had any abdominal pain. No nausea or vomiting. He reports that he has been constipated. Not using nay laxatives or stool softeners at this time. Continue to have peripheral neuropathy that is unchanged. He denies any headaches, any visual changes, any dysphagia, odynophagia, any nausea, vomiting, diarrhea, constipation, chest pain, shortness of breath, productive cough, abdominal pain, abdominal swelling, lower extremity paresthesia, bowel or bladder incontinence, any obvious bleeding such as melena, hematochezia, or hematuria.  He denies any fevers, chills, or night sweats. Appetite is improving at this time.  No new complications from chemotherapy.   Medications: I have reviewed the patient's current medications. Current outpatient  prescriptions:albuterol-ipratropium (COMBIVENT) 18-103 MCG/ACT inhaler, Inhale 1 puff into the lungs every 4 (four) hours as needed. WHEEZING, Disp: , Rfl: ;  aspirin EC 81 MG tablet, Take 1 tablet (81 mg total) by mouth daily., Disp: 100 tablet, Rfl: 0;  digoxin (LANOXIN) 0.125 MG tablet, Take 1 tablet (125 mcg total) by mouth daily., Disp: 30 tablet, Rfl: 0 ferrous sulfate 325 (65 FE) MG tablet, Take 1 tablet (325 mg total) by mouth daily with breakfast., Disp: 30 tablet, Rfl: 0;  Fluticasone-Salmeterol (ADVAIR DISKUS) 500-50 MCG/DOSE AEPB, Inhale 1 puff into the lungs every 12 (twelve) hours., Disp: 60 each, Rfl: 3;  HYDROmorphone (DILAUDID) 4 MG tablet, Take 4 mg by mouth every 6 (six) hours as needed. Pain, Disp: , Rfl:  lisinopril (PRINIVIL,ZESTRIL) 20 MG tablet, Take 1 tablet (20 mg total) by mouth daily., Disp: 30 tablet, Rfl: 0;  metFORMIN (GLUCOPHAGE) 500 MG tablet, Take 500 mg by mouth daily with breakfast. , Disp: , Rfl: ;  nitroGLYCERIN (NITROSTAT) 0.3 MG SL tablet, Place 1 tablet (0.3 mg total) under the tongue every 5 (five) minutes as needed for chest pain., Disp: 90 tablet, Rfl: 0 oxyCODONE (OXY IR/ROXICODONE) 5 MG immediate release tablet, Take 5 mg by mouth every 6 (six) hours as needed. Breakthrough pain, Disp: , Rfl: ;  pantoprazole (PROTONIX) 40 MG tablet, Take 1 tablet (40 mg total) by mouth daily at 12 noon., Disp: 30 tablet, Rfl: 0;  simvastatin (ZOCOR) 40 MG tablet, Take 1 tablet (40 mg total) by mouth at bedtime., Disp: 30 tablet, Rfl: 0 temazepam (RESTORIL) 15 MG capsule, Take 1 capsule (15 mg total) by mouth at bedtime as needed for sleep.,  Disp: 30 capsule, Rfl: 0;  warfarin (COUMADIN) 5 MG tablet, Take 0.5 tablets (2.5 mg total) by mouth daily. Pt takes 5 mg every day except on Monday and Friday pt takes 2.5mg  ( 1/2 tab of 5 mg tablet), Disp: 30 tablet, Rfl: 0 zolpidem (AMBIEN) 5 MG tablet, Take 1 tablet (5 mg total) by mouth at bedtime as needed for sleep (insomnia)., Disp: 30  tablet, Rfl: 0  Allergies: No Known Allergies  Past Medical History, Surgical history, Social history, and Family History were reviewed and updated.  Review of Systems: Constitutional:  Negative for fever, chills, night sweats, anorexia, weight loss, pain. Cardiovascular: no chest pain or dyspnea on exertion Respiratory: no cough, shortness of breath, or wheezing Neurological: no TIA or stroke symptoms Dermatological: negative ENT: negative Skin: Negative. Gastrointestinal: no abdominal pain, change in bowel habits, or black or bloody stools Genito-Urinary: no dysuria, trouble voiding, or hematuria Hematological and Lymphatic: negative Breast: negative Musculoskeletal: negative Remaining ROS negative.  Physical Exam: Blood pressure 114/63, pulse 72, temperature 96.8 F (36 C), temperature source Oral. ECOG: 1-2 General appearance: alert Head: Normocephalic, without obvious abnormality, atraumatic Neck: no adenopathy, no carotid bruit, no JVD, supple, symmetrical, trachea midline and thyroid not enlarged, symmetric, no tenderness/mass/nodules Lymph nodes: Cervical, supraclavicular, and axillary nodes normal. Heart:regular rate and rhythm, S1, S2 normal, no murmur, click, rub or gallop Lung:chest clear, no wheezing, rales, normal symmetric air entry Abdomen: soft, non-tender, without masses or organomegaly EXT:no erythema, induration, or nodules   Lab Results: Lab Results  Component Value Date   WBC 3.8* 02/10/2012   HGB 10.6* 02/10/2012   HCT 31.3* 02/10/2012   MCV 93.8 02/10/2012   PLT 188 02/10/2012    Impression and Plan: This is a pleasant 78 year old gentleman with the following issues:   1. Advanced rectal carcinoma, presented with a T4 N2 disease.  He presented with a complete obstruction.  After segmental resection, he underwent radiation therapy with Xeloda and subsequently systemic chemotherapy.  At this time, he has  evidence of  recurrent disease with pelvic lymph  nodes. He is ready to proceed with cycle 2 of chemotherapy.  2. Anemia. Likely multi-factorial. He has no active bleeding. No transfusion is indicated. He remains on ferrous sulfate. 3. Constipation: Improved at this time. 4. Protein calorie malnutrition: Appetite is improving.   5. Port-A-Cath management. This will be used for chemotherapy.   6. Atrial fibrillation. On Digoxin and anticoagulated with Coumadin. 7. Hypertension.  On lisinopril. 8. DM. On Metformin. 9. Followup: 2 weeks for cycle 2.   Ahonesty Woodfin 10/9/20139:50 AM

## 2012-02-10 NOTE — Telephone Encounter (Signed)
Per staff message and POF I have scheduled appt.  JMW  

## 2012-02-11 ENCOUNTER — Telehealth: Payer: Self-pay | Admitting: Oncology

## 2012-02-11 NOTE — Telephone Encounter (Signed)
Talked to patient and gave him appt for October 2013 , advised to get calendar tomorrow when he gets here

## 2012-02-12 ENCOUNTER — Ambulatory Visit (HOSPITAL_BASED_OUTPATIENT_CLINIC_OR_DEPARTMENT_OTHER): Payer: Medicare Other

## 2012-02-12 VITALS — BP 110/58 | HR 81 | Temp 97.8°F

## 2012-02-12 DIAGNOSIS — C20 Malignant neoplasm of rectum: Secondary | ICD-10-CM

## 2012-02-12 MED ORDER — HEPARIN SOD (PORK) LOCK FLUSH 100 UNIT/ML IV SOLN
500.0000 [IU] | Freq: Once | INTRAVENOUS | Status: AC | PRN
  Administered 2012-02-12: 500 [IU]
  Filled 2012-02-12: qty 5

## 2012-02-12 MED ORDER — SODIUM CHLORIDE 0.9 % IJ SOLN
10.0000 mL | INTRAMUSCULAR | Status: DC | PRN
  Administered 2012-02-12: 10 mL
  Filled 2012-02-12: qty 10

## 2012-02-12 NOTE — Patient Instructions (Signed)
Call MD with any questions 

## 2012-02-24 ENCOUNTER — Other Ambulatory Visit (HOSPITAL_BASED_OUTPATIENT_CLINIC_OR_DEPARTMENT_OTHER): Payer: Medicare Other | Admitting: Lab

## 2012-02-24 ENCOUNTER — Ambulatory Visit (HOSPITAL_BASED_OUTPATIENT_CLINIC_OR_DEPARTMENT_OTHER): Payer: Medicare Other

## 2012-02-24 ENCOUNTER — Encounter: Payer: Self-pay | Admitting: Oncology

## 2012-02-24 ENCOUNTER — Ambulatory Visit (HOSPITAL_BASED_OUTPATIENT_CLINIC_OR_DEPARTMENT_OTHER): Payer: Medicare Other | Admitting: Oncology

## 2012-02-24 VITALS — BP 95/57 | HR 69 | Temp 97.3°F | Resp 22 | Ht 71.0 in | Wt 186.3 lb

## 2012-02-24 DIAGNOSIS — D649 Anemia, unspecified: Secondary | ICD-10-CM

## 2012-02-24 DIAGNOSIS — I4891 Unspecified atrial fibrillation: Secondary | ICD-10-CM

## 2012-02-24 DIAGNOSIS — C772 Secondary and unspecified malignant neoplasm of intra-abdominal lymph nodes: Secondary | ICD-10-CM

## 2012-02-24 DIAGNOSIS — Z5111 Encounter for antineoplastic chemotherapy: Secondary | ICD-10-CM

## 2012-02-24 DIAGNOSIS — C20 Malignant neoplasm of rectum: Secondary | ICD-10-CM

## 2012-02-24 LAB — CBC WITH DIFFERENTIAL/PLATELET
Basophils Absolute: 0 10*3/uL (ref 0.0–0.1)
EOS%: 1.5 % (ref 0.0–7.0)
Eosinophils Absolute: 0.1 10*3/uL (ref 0.0–0.5)
HCT: 32 % — ABNORMAL LOW (ref 38.4–49.9)
HGB: 10.6 g/dL — ABNORMAL LOW (ref 13.0–17.1)
MCH: 30.5 pg (ref 27.2–33.4)
MCV: 92 fL (ref 79.3–98.0)
MONO%: 13.5 % (ref 0.0–14.0)
NEUT#: 2.6 10*3/uL (ref 1.5–6.5)
NEUT%: 65.9 % (ref 39.0–75.0)
lymph#: 0.7 10*3/uL — ABNORMAL LOW (ref 0.9–3.3)

## 2012-02-24 LAB — COMPREHENSIVE METABOLIC PANEL (CC13)
AST: 12 U/L (ref 5–34)
Albumin: 3 g/dL — ABNORMAL LOW (ref 3.5–5.0)
BUN: 17 mg/dL (ref 7.0–26.0)
Calcium: 9 mg/dL (ref 8.4–10.4)
Chloride: 102 mEq/L (ref 98–107)
Creatinine: 1 mg/dL (ref 0.7–1.3)
Glucose: 145 mg/dl — ABNORMAL HIGH (ref 70–99)
Potassium: 3.7 mEq/L (ref 3.5–5.1)

## 2012-02-24 MED ORDER — DEXAMETHASONE SODIUM PHOSPHATE 4 MG/ML IJ SOLN
20.0000 mg | Freq: Once | INTRAMUSCULAR | Status: AC
  Administered 2012-02-24: 20 mg via INTRAVENOUS

## 2012-02-24 MED ORDER — FLUOROURACIL CHEMO INJECTION 2.5 GM/50ML
300.0000 mg/m2 | Freq: Once | INTRAVENOUS | Status: AC
  Administered 2012-02-24: 650 mg via INTRAVENOUS
  Filled 2012-02-24: qty 13

## 2012-02-24 MED ORDER — SODIUM CHLORIDE 0.9 % IV SOLN
Freq: Once | INTRAVENOUS | Status: AC
  Administered 2012-02-24: 10:00:00 via INTRAVENOUS

## 2012-02-24 MED ORDER — LEUCOVORIN CALCIUM INJECTION 350 MG
630.0000 mg | Freq: Once | INTRAMUSCULAR | Status: AC
  Administered 2012-02-24: 630 mg via INTRAVENOUS
  Filled 2012-02-24: qty 31.5

## 2012-02-24 MED ORDER — IRINOTECAN HCL CHEMO INJECTION 100 MG/5ML
135.0000 mg/m2 | Freq: Once | INTRAVENOUS | Status: AC
  Administered 2012-02-24: 284 mg via INTRAVENOUS
  Filled 2012-02-24: qty 14.2

## 2012-02-24 MED ORDER — SODIUM CHLORIDE 0.9 % IV SOLN
1800.0000 mg/m2 | INTRAVENOUS | Status: DC
  Administered 2012-02-24: 3800 mg via INTRAVENOUS
  Filled 2012-02-24: qty 76

## 2012-02-24 MED ORDER — ONDANSETRON 16 MG/50ML IVPB (CHCC)
16.0000 mg | Freq: Once | INTRAVENOUS | Status: AC
  Administered 2012-02-24: 16 mg via INTRAVENOUS

## 2012-02-24 NOTE — Progress Notes (Signed)
Hematology and Oncology Follow Up Visit  Vincent [name] [id number] Nov 08, 1932 78 y.o. 02/24/2012 9:23 AM  CC: Vincent Heckler, MD  Vincent [name], M.D.  Vincent Boop, MD,FACG  Vincent [name], Ph.D., M.D.    Principle Diagnosis: This is a 78 year old gentleman diagnosed with adenocarcinoma of the rectum.  He had T4a N2 disease diagnosed in 2011.  He presented obstruction of the distal rectal area. Now has local relapse.    Prior Therapy: 1. Status post rectosigmoid resection and segmental resection on February 13, 2010.  Tumor was poorly differentiated adenocarcinoma, 8 out of 16 lymph nodes involved, tumor not completely resected at that time.  2. The patient received radiation therapy adamantly concomitantly with Xeloda.  Therapy concluded in January 2012.  3. Treated with adjuvant FOLFOX for a total of 7 cycles of therapy.  The therapy concluded Sep 16, 2010.  Current therapy: FOLFIRI for salvage purposes. He is S/P first cycle on 9/25. He is here for cycle 3.   Interim History:  Vincent [name] presents today for an office followup visit. He tolerated the first cycle without complications. He is still fatigued since hospital discharge but not any worse. He has not had any abdominal pain. No nausea or vomiting. He reports that he has been constipated. Not using any laxatives or stool softeners at this time. Continue to have peripheral neuropathy that is unchanged. He denies any headaches, any visual changes, any dysphagia, odynophagia, any nausea, vomiting, diarrhea, constipation, chest pain, shortness of breath, productive cough, abdominal pain, abdominal swelling, lower extremity paresthesia, bowel or bladder incontinence, any obvious bleeding such as melena, hematochezia, or hematuria.  He denies any fevers, chills, or night sweats. Appetite is improving at this time. No new complications from chemotherapy.   Medications: I have reviewed the patient's current medications. Current outpatient  prescriptions:albuterol-ipratropium (COMBIVENT) 18-103 MCG/ACT inhaler, Inhale 1 puff into the lungs every 4 (four) hours as needed. WHEEZING, Disp: , Rfl: ;  aspirin EC 81 MG tablet, Take 1 tablet (81 mg total) by mouth daily., Disp: 100 tablet, Rfl: 0;  digoxin (LANOXIN) 0.125 MG tablet, Take 1 tablet (125 mcg total) by mouth daily., Disp: 30 tablet, Rfl: 0 ferrous sulfate 325 (65 FE) MG tablet, Take 1 tablet (325 mg total) by mouth daily with breakfast., Disp: 30 tablet, Rfl: 0;  Fluticasone-Salmeterol (ADVAIR DISKUS) 500-50 MCG/DOSE AEPB, Inhale 1 puff into the lungs every 12 (twelve) hours., Disp: 60 each, Rfl: 3;  HYDROmorphone (DILAUDID) 4 MG tablet, Take 4 mg by mouth every 6 (six) hours as needed. Pain, Disp: , Rfl:  lisinopril (PRINIVIL,ZESTRIL) 20 MG tablet, Take 1 tablet (20 mg total) by mouth daily., Disp: 30 tablet, Rfl: 0;  metFORMIN (GLUCOPHAGE) 500 MG tablet, Take 500 mg by mouth daily with breakfast. , Disp: , Rfl: ;  nitroGLYCERIN (NITROSTAT) 0.3 MG SL tablet, Place 1 tablet (0.3 mg total) under the tongue every 5 (five) minutes as needed for chest pain., Disp: 90 tablet, Rfl: 0 oxyCODONE (OXY IR/ROXICODONE) 5 MG immediate release tablet, Take 5 mg by mouth every 6 (six) hours as needed. Breakthrough pain, Disp: , Rfl: ;  pantoprazole (PROTONIX) 40 MG tablet, Take 1 tablet (40 mg total) by mouth daily at 12 noon., Disp: 30 tablet, Rfl: 0;  simvastatin (ZOCOR) 40 MG tablet, Take 1 tablet (40 mg total) by mouth at bedtime., Disp: 30 tablet, Rfl: 0 temazepam (RESTORIL) 15 MG capsule, Take 1 capsule (15 mg total) by mouth at bedtime as needed for sleep., Disp:  30 capsule, Rfl: 0;  warfarin (COUMADIN) 5 MG tablet, Take 0.5 tablets (2.5 mg total) by mouth daily. Pt takes 5 mg every day except on Monday and Friday pt takes 2.5mg  ( 1/2 tab of 5 mg tablet), Disp: 30 tablet, Rfl: 0 zolpidem (AMBIEN) 5 MG tablet, Take 1 tablet (5 mg total) by mouth at bedtime as needed for sleep (insomnia)., Disp: 30  tablet, Rfl: 0  Allergies: No Known Allergies  Past Medical History, Surgical history, Social history, and Family History were reviewed and updated.  Review of Systems: Constitutional:  Negative for fever, chills, night sweats, anorexia, weight loss, pain. Cardiovascular: no chest pain or dyspnea on exertion Respiratory: no cough, shortness of breath, or wheezing Neurological: no TIA or stroke symptoms Dermatological: negative ENT: negative Skin: Negative. Gastrointestinal: no abdominal pain, change in bowel habits, or black or bloody stools Genito-Urinary: no dysuria, trouble voiding, or hematuria Hematological and Lymphatic: negative Breast: negative Musculoskeletal: negative Remaining ROS negative.  Physical Exam: Blood pressure 95/57, pulse 69, temperature 97.3 F (36.3 C), temperature source Oral, resp. rate 22, height 5\' 11"  (1.803 m), weight 186 lb 4.8 oz (84.505 kg). ECOG: 1-2 General appearance: alert Head: Normocephalic, without obvious abnormality, atraumatic Neck: no adenopathy, no carotid bruit, no JVD, supple, symmetrical, trachea midline and thyroid not enlarged, symmetric, no tenderness/mass/nodules Lymph nodes: Cervical, supraclavicular, and axillary nodes normal. Heart:regular rate and rhythm, S1, S2 normal, no murmur, click, rub or gallop Lung:chest clear, no wheezing, rales, normal symmetric air entry Abdomen: soft, non-tender, without masses or organomegaly EXT:no erythema, induration, or nodules   Lab Results: Lab Results  Component Value Date   WBC 3.9* 02/24/2012   HGB 10.6* 02/24/2012   HCT 32.0* 02/24/2012   MCV 92.0 02/24/2012   PLT 229 02/24/2012    Impression and Plan: This is a pleasant 78 year old gentleman with the following issues:   1. Advanced rectal carcinoma, presented with a T4 N2 disease.  He presented with a complete obstruction.  After segmental resection, he underwent radiation therapy with Xeloda and subsequently systemic  chemotherapy.  At this time, he has  evidence of  recurrent disease with pelvic lymph nodes. He is ready to proceed with cycle 3 of chemotherapy.  2. Anemia. Likely multi-factorial. He has no active bleeding. No transfusion is indicated. He remains on ferrous sulfate. 3. Constipation: Improved at this time. 4. Protein calorie malnutrition: Appetite is improving.   5. Port-A-Cath management. This will be used for chemotherapy.   6. Atrial fibrillation. On Digoxin and anticoagulated with Coumadin. 7. Hypertension.  On lisinopril. 8. DM. On Metformin. 9. Followup: 2 weeks for cycle 3.   Kinesha Auten 10/23/20139:23 AM

## 2012-02-26 ENCOUNTER — Encounter: Payer: Medicare Other | Admitting: Nutrition

## 2012-02-26 ENCOUNTER — Ambulatory Visit (HOSPITAL_BASED_OUTPATIENT_CLINIC_OR_DEPARTMENT_OTHER): Payer: Medicare Other

## 2012-02-26 VITALS — BP 110/54 | HR 64 | Temp 96.6°F

## 2012-02-26 DIAGNOSIS — C20 Malignant neoplasm of rectum: Secondary | ICD-10-CM

## 2012-02-26 MED ORDER — SODIUM CHLORIDE 0.9 % IJ SOLN
10.0000 mL | INTRAMUSCULAR | Status: DC | PRN
  Administered 2012-02-26: 10 mL
  Filled 2012-02-26: qty 10

## 2012-02-26 MED ORDER — HEPARIN SOD (PORK) LOCK FLUSH 100 UNIT/ML IV SOLN
500.0000 [IU] | Freq: Once | INTRAVENOUS | Status: AC | PRN
  Administered 2012-02-26: 500 [IU]
  Filled 2012-02-26: qty 5

## 2012-02-26 NOTE — Patient Instructions (Signed)
Call MD for problems 

## 2012-03-01 ENCOUNTER — Inpatient Hospital Stay (HOSPITAL_COMMUNITY)
Admission: EM | Admit: 2012-03-01 | Discharge: 2012-03-14 | DRG: 336 | Disposition: A | Discharge status: Home Health | Payer: Medicare Other | Attending: Internal Medicine | Admitting: Internal Medicine

## 2012-03-01 ENCOUNTER — Emergency Department (HOSPITAL_COMMUNITY): Payer: Medicare Other

## 2012-03-01 ENCOUNTER — Encounter (HOSPITAL_COMMUNITY): Payer: Self-pay

## 2012-03-01 DIAGNOSIS — E46 Unspecified protein-calorie malnutrition: Secondary | ICD-10-CM | POA: Diagnosis present

## 2012-03-01 DIAGNOSIS — E871 Hypo-osmolality and hyponatremia: Secondary | ICD-10-CM | POA: Diagnosis present

## 2012-03-01 DIAGNOSIS — D649 Anemia, unspecified: Secondary | ICD-10-CM | POA: Diagnosis present

## 2012-03-01 DIAGNOSIS — D630 Anemia in neoplastic disease: Secondary | ICD-10-CM | POA: Diagnosis present

## 2012-03-01 DIAGNOSIS — R112 Nausea with vomiting, unspecified: Secondary | ICD-10-CM

## 2012-03-01 DIAGNOSIS — I9589 Other hypotension: Secondary | ICD-10-CM | POA: Diagnosis not present

## 2012-03-01 DIAGNOSIS — I959 Hypotension, unspecified: Secondary | ICD-10-CM

## 2012-03-01 DIAGNOSIS — I714 Abdominal aortic aneurysm, without rupture, unspecified: Secondary | ICD-10-CM | POA: Diagnosis present

## 2012-03-01 DIAGNOSIS — I5032 Chronic diastolic (congestive) heart failure: Secondary | ICD-10-CM | POA: Diagnosis present

## 2012-03-01 DIAGNOSIS — E876 Hypokalemia: Secondary | ICD-10-CM | POA: Diagnosis present

## 2012-03-01 DIAGNOSIS — IMO0002 Reserved for concepts with insufficient information to code with codable children: Secondary | ICD-10-CM | POA: Diagnosis present

## 2012-03-01 DIAGNOSIS — K56609 Unspecified intestinal obstruction, unspecified as to partial versus complete obstruction: Secondary | ICD-10-CM | POA: Diagnosis present

## 2012-03-01 DIAGNOSIS — I480 Paroxysmal atrial fibrillation: Secondary | ICD-10-CM

## 2012-03-01 DIAGNOSIS — K560 Paralytic ileus: Secondary | ICD-10-CM | POA: Diagnosis not present

## 2012-03-01 DIAGNOSIS — D62 Acute posthemorrhagic anemia: Secondary | ICD-10-CM

## 2012-03-01 DIAGNOSIS — I10 Essential (primary) hypertension: Secondary | ICD-10-CM

## 2012-03-01 DIAGNOSIS — D72819 Decreased white blood cell count, unspecified: Secondary | ICD-10-CM | POA: Diagnosis present

## 2012-03-01 DIAGNOSIS — I4891 Unspecified atrial fibrillation: Secondary | ICD-10-CM | POA: Diagnosis present

## 2012-03-01 DIAGNOSIS — R109 Unspecified abdominal pain: Secondary | ICD-10-CM

## 2012-03-01 DIAGNOSIS — E119 Type 2 diabetes mellitus without complications: Secondary | ICD-10-CM | POA: Diagnosis present

## 2012-03-01 DIAGNOSIS — E236 Other disorders of pituitary gland: Secondary | ICD-10-CM | POA: Diagnosis present

## 2012-03-01 DIAGNOSIS — R7989 Other specified abnormal findings of blood chemistry: Secondary | ICD-10-CM | POA: Diagnosis present

## 2012-03-01 DIAGNOSIS — I509 Heart failure, unspecified: Secondary | ICD-10-CM | POA: Diagnosis present

## 2012-03-01 DIAGNOSIS — Z66 Do not resuscitate: Secondary | ICD-10-CM | POA: Diagnosis present

## 2012-03-01 DIAGNOSIS — Z5331 Laparoscopic surgical procedure converted to open procedure: Secondary | ICD-10-CM

## 2012-03-01 DIAGNOSIS — Z7901 Long term (current) use of anticoagulants: Secondary | ICD-10-CM

## 2012-03-01 DIAGNOSIS — C969 Malignant neoplasm of lymphoid, hematopoietic and related tissue, unspecified: Secondary | ICD-10-CM | POA: Diagnosis present

## 2012-03-01 DIAGNOSIS — S31109A Unspecified open wound of abdominal wall, unspecified quadrant without penetration into peritoneal cavity, initial encounter: Secondary | ICD-10-CM | POA: Diagnosis not present

## 2012-03-01 DIAGNOSIS — K565 Intestinal adhesions [bands], unspecified as to partial versus complete obstruction: Principal | ICD-10-CM | POA: Diagnosis present

## 2012-03-01 DIAGNOSIS — C20 Malignant neoplasm of rectum: Secondary | ICD-10-CM

## 2012-03-01 HISTORY — DX: Abdominal aortic aneurysm, without rupture: I71.4

## 2012-03-01 HISTORY — DX: Encounter for attention to colostomy: Z43.3

## 2012-03-01 LAB — CBC WITH DIFFERENTIAL/PLATELET
HCT: 30.4 % — ABNORMAL LOW (ref 39.0–52.0)
Hemoglobin: 10.4 g/dL — ABNORMAL LOW (ref 13.0–17.0)
Lymphocytes Relative: 10 % — ABNORMAL LOW (ref 12–46)
Lymphs Abs: 0.5 10*3/uL — ABNORMAL LOW (ref 0.7–4.0)
MCV: 89.7 fL (ref 78.0–100.0)
Monocytes Absolute: 0.2 10*3/uL (ref 0.1–1.0)
Monocytes Relative: 5 % (ref 3–12)
Neutro Abs: 4 10*3/uL (ref 1.7–7.7)
WBC: 4.7 10*3/uL (ref 4.0–10.5)

## 2012-03-01 LAB — BASIC METABOLIC PANEL
CO2: 30 mEq/L (ref 19–32)
Chloride: 93 mEq/L — ABNORMAL LOW (ref 96–112)
GFR calc Af Amer: 90 mL/min — ABNORMAL LOW (ref >90)
Potassium: 3.6 mEq/L (ref 3.5–5.1)

## 2012-03-01 MED ORDER — ONDANSETRON HCL 4 MG/2ML IJ SOLN
INTRAMUSCULAR | Status: AC
  Filled 2012-03-01: qty 2

## 2012-03-01 MED ORDER — HYDROMORPHONE HCL PF 1 MG/ML IJ SOLN
1.0000 mg | Freq: Once | INTRAMUSCULAR | Status: AC
  Administered 2012-03-01: 1 mg via INTRAVENOUS
  Filled 2012-03-01: qty 1

## 2012-03-01 MED ORDER — ONDANSETRON HCL 4 MG/2ML IJ SOLN
4.0000 mg | Freq: Once | INTRAMUSCULAR | Status: AC
  Administered 2012-03-01: 4 mg via INTRAVENOUS
  Filled 2012-03-01: qty 2

## 2012-03-01 MED ORDER — SODIUM CHLORIDE 0.9 % IV SOLN
INTRAVENOUS | Status: AC
  Administered 2012-03-02: 150 mL/h via INTRAVENOUS

## 2012-03-01 MED ORDER — IOHEXOL 300 MG/ML  SOLN
100.0000 mL | Freq: Once | INTRAMUSCULAR | Status: AC | PRN
  Administered 2012-03-01: 100 mL via INTRAVENOUS

## 2012-03-01 MED ORDER — SODIUM CHLORIDE 0.9 % IV SOLN
INTRAVENOUS | Status: DC
  Administered 2012-03-01: 20:00:00 via INTRAVENOUS
  Administered 2012-03-02: 125 mL/h via INTRAVENOUS

## 2012-03-01 MED ORDER — ONDANSETRON HCL 4 MG/2ML IJ SOLN
4.0000 mg | Freq: Once | INTRAMUSCULAR | Status: AC
  Administered 2012-03-01: 4 mg via INTRAVENOUS

## 2012-03-01 NOTE — H&P (Signed)
PCP:   Aura Dials, MD   Chief Complaint:  N/v abd pain  HPI: 78 yo male h/o colon cancer s/p resection with colostomy, recently dx rectal cancer, AAA comes in with sudden onset of n/v and abd pain at 3am yesterday morning (approx 20 hours ago) that has progressively worsened.  Vomit is nonbloody and no coffee ground appearance.  Has not had bm in several days and is not passing any gas.  No fevers.  abd pain is diffuse and he has been feeling bloated.  He has been having outpt in his colostomy bag which is also nonbloody.  gen surgery has already seen pt in ED and request medical admission.  Pt has had ngt placed and ivf in ED and feels a lot better since arrival to ED.  Review of Systems:  O/w neg  Past Medical History: Past Medical History  Diagnosis Date  . Hypertension   . Arthritis   . Heart disease   . SOB (shortness of breath)   . Malignant neoplasm of hepatic flexure   . Malignant neoplasm of rectosigmoid junction   . Cancer     and/or colon polyp  . Malignant neoplasm of hepatic flexure   . Malignant neoplasm of rectosigmoid junction   . Diabetes mellitus   . Atrial fibrillation   . Colostomy care   . AAA (abdominal aortic aneurysm) without rupture 03/02/2012   Past Surgical History  Procedure Date  . Back surgery [phone]  . Arthroscopic knee 1992  . Adenocarcinoma of the rectum [id number]  . Exploratory laparotomy 02/2010  . Rectosigmoid colon resection 02/2010  . Colostomy 02/2010  . Drainage of intra-abdominal abscess 02/2010  . Flexible sigmoidoscopy 12/18/2011    Procedure: FLEXIBLE SIGMOIDOSCOPY;  Surgeon: Beverley Fiedler, MD;  Location: [facility];  Service: Gastroenterology;  Laterality: N/A;  . Colon surgery   . Cosmetic surgery     Medications: Prior to Admission medications   Medication Sig Start Date End Date Taking? Authorizing Provider  albuterol-ipratropium (COMBIVENT) 18-103 MCG/ACT inhaler Inhale 1 puff into the lungs every 4 (four) hours  as needed. WHEEZING 12/28/11  Yes Alison Murray, MD  aspirin EC 81 MG tablet Take 81 mg by mouth daily. 01/16/12 01/15/13 Yes Laveda Norman, MD  digoxin (LANOXIN) 0.125 MG tablet Take 1 tablet (125 mcg total) by mouth daily. 12/28/11  Yes Alison Murray, MD  Fluticasone-Salmeterol (ADVAIR DISKUS) 500-50 MCG/DOSE AEPB Inhale 1 puff into the lungs every 12 (twelve) hours. 12/28/11  Yes Alison Murray, MD  HYDROmorphone (DILAUDID) 4 MG tablet Take 4 mg by mouth every 6 (six) hours as needed. Pain 12/28/11  Yes Historical Provider, MD  metFORMIN (GLUCOPHAGE) 500 MG tablet Take 500 mg by mouth daily with breakfast.  11/12/11  Yes Historical Provider, MD  nitroGLYCERIN (NITROSTAT) 0.3 MG SL tablet Place 0.3 mg under the tongue every 5 (five) minutes as needed. 01/16/12 01/15/13 Yes Laveda Norman, MD  oxyCODONE (OXY IR/ROXICODONE) 5 MG immediate release tablet Take 5 mg by mouth every 6 (six) hours as needed. Breakthrough pain 12/28/11  Yes Historical Provider, MD  pantoprazole (PROTONIX) 40 MG tablet Take 1 tablet (40 mg total) by mouth daily at 12 noon. 12/28/11 12/27/12 Yes Alison Murray, MD  simvastatin (ZOCOR) 40 MG tablet Take 1 tablet (40 mg total) by mouth at bedtime. 12/28/11  Yes Alison Murray, MD  temazepam (RESTORIL) 15 MG capsule Take 15 mg by mouth at bedtime as needed. 02/10/12  Yes Milinda Cave  Kallie Locks, MD  warfarin (COUMADIN) 5 MG tablet Take 0.5 tablets (2.5 mg total) by mouth daily. Pt takes 5 mg every day except on Monday and Friday pt takes 2.5mg  ( 1/2 tab of 5 mg tablet) 12/28/11  Yes Alison Murray, MD  zolpidem (AMBIEN) 5 MG tablet Take 1 tablet (5 mg total) by mouth at bedtime as needed for sleep (insomnia). 12/28/11 12/27/12 Yes Alison Murray, MD    Allergies:  No Known Allergies  Social History:  reports that he quit smoking about 3 years ago. His smoking use included Cigarettes. He has a 120 pack-year smoking history. He has never used smokeless tobacco. He reports that he does not drink alcohol. His drug  history not on file.  Family History: Family History  Problem Relation Age of Onset  . Cancer Father     Physical Exam: Filed Vitals:   03/01/12 1850 03/01/12 2345  BP: 131/77 131/62  Pulse: 70 72  Temp: 97.8 F (36.6 C) 98.6 F (37 C)  TempSrc: Oral Oral  Resp: 18 18  SpO2: 100% 94%   General appearance: alert, cooperative and no distress ngt placed with bilious liquid approx 1/2 liter output already Neck: no JVD and supple, symmetrical, trachea midline Lungs: clear to auscultation bilaterally Heart: regular rate and rhythm, S1, S2 normal, no murmur, click, rub or gallop Abdomen: soft, nd, mild ttp diffuse dec bs no r/g nonacute abd Extremities: extremities normal, atraumatic, no cyanosis or edema Pulses: 2+ and symmetric Skin: Skin color, texture, turgor normal. No rashes or lesions Neurologic: Grossly normal  Labs on Admission:   [facility] 03/01/12 2000  NA 131*  K 3.6  CL 93*  CO2 30  GLUCOSE 162*  BUN 15  CREATININE 0.99  CALCIUM 9.2  MG --  PHOS --    Basename 03/01/12 2000  WBC 4.7  NEUTROABS 4.0  HGB 10.4*  HCT 30.4*  MCV 89.7  PLT 246   Radiological Exams on Admission: Ct Abdomen Pelvis W Contrast  03/01/2012  *RADIOLOGY REPORT*  Clinical Data: Abdominal pain and vomiting.  Status post hospitalization for bowel obstruction 3 weeks ago.  CT ABDOMEN AND PELVIS WITH CONTRAST  Technique:  Multidetector CT imaging of the abdomen and pelvis was performed following the standard protocol during bolus administration of intravenous contrast.  Contrast: OMNIPAQUE IOHEXOL 300 MG/ML  SOLN  Comparison: CT abdomen and pelvis 01/11/2012,12/27/2011, 04/09/2011 and 10/20/2010.  Chest and two views abdomen 03/01/2012 at 2020 hours.  Findings: The lung bases are clear.  No pleural or pericardial effusion.  The patient has a small bowel obstruction with loops dilated up to 4.2 cm.  No free intraperitoneal air, pneumatosis or portal venous gas is identified.  The  transition point is in the left lower quadrant where a kinked loop of small bowel is identified and there is transition to decompressed loops.  Small amount of interloop fluid is noted.  Left lower quadrant colostomy is noted.  Marked wall thickening and enhancement of the patient's rectal stump is again seen and does not appear changed since the most recent study.  Descending abdominal aortic aneurysm measuring 4.1 by 4.4 cm appears unchanged.  The gallbladder, spleen, adrenal glands, pancreas and kidneys appear normal.  Small low attenuating lesions in the liver are unchanged. No focal bony abnormality is identified.  IMPRESSION:  1.  Study is positive for small bowel obstruction with the transition point in the right lower quadrant.  Obstruction appears to be due to adhesions. 2.  No change in appearance of recurrent/residual rectal carcinoma since the most recent CT. 3.  Unchanged 4.4 cm abdominal aortic aneurysm.   Original Report Authenticated By: Bernadene Bell. D'ALESSIO, M.D.    Dg Abd Acute W/chest  03/01/2012  *RADIOLOGY REPORT*  Clinical Data: Abdominal pain.  Rule out obstruction  ACUTE ABDOMEN SERIES (ABDOMEN 2 VIEW & CHEST 1 VIEW)  Comparison: 01/13/2012  Findings: Port-A-Cath tip in the SVC, unchanged.  Lungs are clear without infiltrate or effusion.  Negative for heart failure.  Mildly distended small bowel loops in the left upper quadrant with air-fluid levels.  Colon is decompressed.  Negative for pneumoperitoneum.  Prior laminectomy L5-S1 on the left.  IMPRESSION: No active cardiopulmonary disease.  Mild small bowel dilatation with air-fluid levels, improved from the  prior study.  This may represent early small bowel obstruction and continued follow-up is suggested.   Original Report Authenticated By: Camelia Phenes, M.D.     Assessment/Plan Present on Admission:  78 yo male with n, v, abd pain sudden onset from sbo also with h/o colon cancer s/p colostomy and rectal cancer .Abdominal  pain .SBO (small bowel obstruction) .Rectal carcinoma .PAF (paroxysmal atrial fibrillation) .HTN (hypertension) .Nausea & vomiting .AAA (abdominal aortic aneurysm) without rupture  Sbo, gen surgery consulted and note pending.  Hopefully will improve with conservative management.  Cont ngt for decompression, ivf and zofran/dilaudid prn.  Hold all meds.  Ck dig level.  Added his oncologist to attending list.  Kriste Broman A 03/02/2012, 12:25 AM

## 2012-03-01 NOTE — ED Notes (Signed)
Patient c/o mid abdominal pain and vomiting. Patient reports that he vomited 3-4 times in the past 24 hours. Patient also reports that he has not had any stool in his colostomy x 2 days. Patient was hospitalized for bowel obstruction approx. 3 weeks ago.

## 2012-03-01 NOTE — ED Provider Notes (Addendum)
History     CSN: [id number]  Arrival date & time 03/01/12  7829   First MD Initiated Contact with Patient 03/01/12 1904      Chief Complaint  Patient presents with  . Abdominal Pain  . Emesis    (Consider location/radiation/quality/duration/timing/severity/associated sxs/prior treatment) Patient is a 78 y.o. male presenting with abdominal pain and vomiting. The history is provided by the patient.  Abdominal Pain The primary symptoms of the illness include abdominal pain, nausea and vomiting. The primary symptoms of the illness do not include fever, shortness of breath or diarrhea.  Symptoms associated with the illness do not include chills, diaphoresis or back pain.  Emesis  Associated symptoms include abdominal pain. Pertinent negatives include no chills, no cough, no diarrhea, no fever and no headaches.   24, old male, with a history of colostomy , due 2.  Rectosigmoid  Cancer and history of prior small bowel obstruction, presents to emergency department complaining of abdominal pain, with nausea, and vomiting.  His symptoms began around 3:00.  This morning.  They have been persistent.  He feels as if he has another obstruction.  He has not had a bowel movement since yesterday.  He denies cough, chest pain, or shortness of breath.  He has not had fevers, or chills.  Besides his abdomen.  Nothing else hurts.  Past Medical History  Diagnosis Date  . Hypertension   . Arthritis   . Heart disease   . SOB (shortness of breath)   . Malignant neoplasm of hepatic flexure   . Malignant neoplasm of rectosigmoid junction   . Cancer     and/or colon polyp  . Malignant neoplasm of hepatic flexure   . Malignant neoplasm of rectosigmoid junction   . Diabetes mellitus   . Atrial fibrillation   . Colostomy care     Past Surgical History  Procedure Date  . Back surgery [phone]  . Arthroscopic knee 1992  . 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 Exploratory laparotomy 02/2010  .  Rectosigmoid colon resection 02/2010  . Colostomy 02/2010  . Drainage of intra-abdominal abscess 02/2010  . Flexible sigmoidoscopy 12/18/2011    Procedure: FLEXIBLE SIGMOIDOSCOPY;  Surgeon: Beverley Fiedler, MD;  Location: [facility];  Service: Gastroenterology;  Laterality: N/A;  . Colon surgery   . Cosmetic surgery     Family History  Problem Relation Age of Onset  . Cancer Father     History  Substance Use Topics  . Smoking status: Former Smoker -- 2.0 packs/day for 60 years    Types: Cigarettes    Quit date: 01/10/2009  . Smokeless tobacco: Never Used  . Alcohol Use: No      Review of Systems  Constitutional: Negative for fever, chills and diaphoresis.  Respiratory: Negative for cough and shortness of breath.   Cardiovascular: Negative for chest pain.  Gastrointestinal: Positive for nausea, vomiting and abdominal pain. Negative for diarrhea.  Musculoskeletal: Negative for back pain.  Neurological: Negative for headaches.  Psychiatric/Behavioral: Negative for confusion.  All other systems reviewed and are negative.    Allergies  Review of patient's allergies indicates no known allergies.  Home Medications   Current Outpatient Rx  Name Route Sig Dispense Refill  . IPRATROPIUM-ALBUTEROL 18-103 MCG/ACT IN AERO Inhalation Inhale 1 puff into the lungs every 4 (four) hours as needed. WHEEZING    . ASPIRIN EC 81 MG PO TBEC Oral Take 81 mg by mouth daily.    Marland Kitchen DIGOXIN 0.125 MG PO TABS  Oral Take 1 tablet (125 mcg total) by mouth daily. 30 tablet 0  . FLUTICASONE-SALMETEROL 500-50 MCG/DOSE IN AEPB Inhalation Inhale 1 puff into the lungs every 12 (twelve) hours. 60 each 3  . HYDROMORPHONE HCL 4 MG PO TABS Oral Take 4 mg by mouth every 6 (six) hours as needed. Pain    . METFORMIN HCL 500 MG PO TABS Oral Take 500 mg by mouth daily with breakfast.     . NITROGLYCERIN 0.3 MG SL SUBL Sublingual Place 0.3 mg under the tongue every 5 (five) minutes as needed.    . OXYCODONE HCL 5 MG  PO TABS Oral Take 5 mg by mouth every 6 (six) hours as needed. Breakthrough pain    . PANTOPRAZOLE SODIUM 40 MG PO TBEC Oral Take 1 tablet (40 mg total) by mouth daily at 12 noon. 30 tablet 0  . SIMVASTATIN 40 MG PO TABS Oral Take 1 tablet (40 mg total) by mouth at bedtime. 30 tablet 0  . TEMAZEPAM 15 MG PO CAPS Oral Take 15 mg by mouth at bedtime as needed.    . WARFARIN SODIUM 5 MG PO TABS Oral Take 0.5 tablets (2.5 mg total) by mouth daily. Pt takes 5 mg every day except on Monday and Friday pt takes 2.5mg  ( 1/2 tab of 5 mg tablet) 30 tablet 0  . ZOLPIDEM TARTRATE 5 MG PO TABS Oral Take 1 tablet (5 mg total) by mouth at bedtime as needed for sleep (insomnia). 30 tablet 0    BP 131/77  Pulse 70  Temp 97.8 F (36.6 C) (Oral)  Resp 18  SpO2 100%  Physical Exam  Nursing note and vitals reviewed. Constitutional: He is oriented to person, place, and time. He appears well-developed and well-nourished. No distress.       Uncomfortable appear  HENT:  Head: Normocephalic and atraumatic.  Eyes: Conjunctivae normal and EOM are normal.  Neck: Normal range of motion. Neck supple.  Cardiovascular: Normal rate, regular rhythm and intact distal pulses.   No murmur heard.      Irregular  Pulmonary/Chest: Effort normal and breath sounds normal. No respiratory distress. He has no rales.  Abdominal: Soft. He exhibits no distension. There is tenderness. There is no rebound and no guarding.       Decreased bowel sounds.  Diffuse tenderness  Musculoskeletal: Normal range of motion. He exhibits no edema.  Neurological: He is alert and oriented to person, place, and time. No cranial nerve deficit.  Skin: Skin is warm and dry.  Psychiatric: He has a normal mood and affect. Thought content normal.    ED Course  Procedures (including critical care time) 78 year old, male, with history of colostomy, after treatment.  For rectosigmoid cancer, and history of prior small bowel obstruction, present with  abdominal pain, and vomiting.  He feels as if he has a recurrent obstruction.  He sat diffuse tenderness.  We will perform an acute abdominal series and laboratory testing, for evaluation of treated with Dilaudid, and Zofran.   Labs Reviewed  BASIC METABOLIC PANEL  CBC WITH DIFFERENTIAL   No results found.   No diagnosis found.  ECG. Normal sinus rhythm at 70 beats per minute. Normal axis. First degree AV block. Normal.  ST and T waves  11:20 PM Spoke with Dr. Johna Sheriff.  He will come consult on pt.  He asked that I call medicine for admission.  11:42 PM Spoke with Dr. Onalee Hua. She will admit pt for tx of sbo.  MDM  Abdominal pain, with nausea, and vomiting sbo        Cheri Guppy, MD 03/01/12 1191  Cheri Guppy, MD 03/01/12 4782  Cheri Guppy, MD 03/01/12 2342

## 2012-03-01 NOTE — Consult Note (Signed)
Reason for Consult:small bowel obstruction  Referring Physician: Nino Parsley, EDP  Vincent [name] is an 79 y.o. male.  HPI: patient is a 78 year old male with a history of locally advanced cancer of the rectum status post partial resection in 2011 with tumor left behind in the pelvis and colostomy. He had had radiation and chemotherapy. He subsequently has had followup chemotherapy and is currently undergoing chemotherapy regimen with Dr. Clelia Croft. He presents to the emergency room with less than 24 hours of acute onset of severe crampy midabdominal pain and nausea and vomiting. There is some constant aching pain in the midabdomen and in severe cramps. Vomiting has been somewhat bilious. He has not had anything out of his colostomy for about 24 hours. The patient was admitted with apparent small bowel obstruction about 6 weeks ago that resolved nonoperatively. 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 He continues to have some bleeding and mucus discharge from his rectum.  Past Medical History  Diagnosis Date  . Hypertension   . Arthritis   . Heart disease   . SOB (shortness of breath)   . Malignant neoplasm of hepatic flexure   . Malignant neoplasm of rectosigmoid junction   . Cancer     and/or colon polyp  . Malignant neoplasm of hepatic flexure   . Malignant neoplasm of rectosigmoid junction   . Diabetes mellitus   . Atrial fibrillation   . Colostomy care     Past Surgical History  Procedure Date  . Back surgery [phone]  . Arthroscopic knee 1992  . 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 Exploratory laparotomy 02/2010  . Rectosigmoid colon resection 02/2010  . Colostomy 02/2010  . Drainage of intra-abdominal abscess 02/2010  . Flexible sigmoidoscopy 12/18/2011    Procedure: FLEXIBLE SIGMOIDOSCOPY;  Surgeon: Beverley Fiedler, MD;  Location: [facility];  Service: Gastroenterology;  Laterality: N/A;  . Colon surgery   . Cosmetic surgery     Family  History  Problem Relation Age of Onset  . Cancer Father     Social History:  reports that he quit smoking about 3 years ago. His smoking use included Cigarettes. He has a 120 pack-year smoking history. He has never used smokeless tobacco. He reports that he does not drink alcohol. His drug history not on file.  Allergies: No Known Allergies  Current Facility-Administered Medications  Medication Dose Route Frequency Provider Last Rate Last Dose  . 0.9 %  sodium chloride infusion   Intravenous Continuous Cheri Guppy, MD 125 mL/hr at 03/01/12 1955    . 0.9 %  sodium chloride infusion   Intravenous STAT Cheri Guppy, MD      . HYDROmorphone (DILAUDID) injection 1 mg  1 mg Intravenous Once Cheri Guppy, MD   1 mg at 03/01/12 1955  . HYDROmorphone (DILAUDID) injection 1 mg  1 mg Intravenous Once Cheri Guppy, MD   1 mg at 03/01/12 2205  . iohexol (OMNIPAQUE) 300 MG/ML solution 100 mL  100 mL Intravenous Once PRN Medication Radiologist, MD   100 mL at 03/01/12 2234  . ondansetron (ZOFRAN) injection 4 mg  4 mg Intravenous Once Cheri Guppy, MD   4 mg at 03/01/12 1955  . ondansetron (ZOFRAN) injection 4 mg  4 mg Intravenous Once Cheri Guppy, MD   4 mg at 03/01/12 2205   Current Outpatient Prescriptions  Medication Sig Dispense Refill  . albuterol-ipratropium (COMBIVENT) 18-103 MCG/ACT inhaler Inhale 1 puff into the lungs every 4 (four) hours as needed. WHEEZING      .  aspirin EC 81 MG tablet Take 81 mg by mouth daily.      . digoxin (LANOXIN) 0.125 MG tablet Take 1 tablet (125 mcg total) by mouth daily.  30 tablet  0  . Fluticasone-Salmeterol (ADVAIR DISKUS) 500-50 MCG/DOSE AEPB Inhale 1 puff into the lungs every 12 (twelve) hours.  60 each  3  . HYDROmorphone (DILAUDID) 4 MG tablet Take 4 mg by mouth every 6 (six) hours as needed. Pain      . metFORMIN (GLUCOPHAGE) 500 MG tablet Take 500 mg by mouth daily with breakfast.       . nitroGLYCERIN (NITROSTAT) 0.3 MG SL  tablet Place 0.3 mg under the tongue every 5 (five) minutes as needed.      Marland Kitchen oxyCODONE (OXY IR/ROXICODONE) 5 MG immediate release tablet Take 5 mg by mouth every 6 (six) hours as needed. Breakthrough pain      . pantoprazole (PROTONIX) 40 MG tablet Take 1 tablet (40 mg total) by mouth daily at 12 noon.  30 tablet  0  . simvastatin (ZOCOR) 40 MG tablet Take 1 tablet (40 mg total) by mouth at bedtime.  30 tablet  0  . temazepam (RESTORIL) 15 MG capsule Take 15 mg by mouth at bedtime as needed.      . warfarin (COUMADIN) 5 MG tablet Take 0.5 tablets (2.5 mg total) by mouth daily. Pt takes 5 mg every day except on Monday and Friday pt takes 2.5mg  ( 1/2 tab of 5 mg tablet)  30 tablet  0  . zolpidem (AMBIEN) 5 MG tablet Take 1 tablet (5 mg total) by mouth at bedtime as needed for sleep (insomnia).  30 tablet  0  . DISCONTD: nitroGLYCERIN (NITROSTAT) 0.3 MG SL tablet Place 1 tablet (0.3 mg total) under the tongue every 5 (five) minutes as needed for chest pain.  90 tablet  0  . DISCONTD: temazepam (RESTORIL) 15 MG capsule Take 1 capsule (15 mg total) by mouth at bedtime as needed for sleep.  30 capsule  0     Results for orders placed during the hospital encounter of 03/01/12 (from the past 48 hour(s))  BASIC METABOLIC PANEL     Status: Abnormal   Collection Time   03/01/12  8:00 PM      Component Value Range Comment   Sodium 131 (*) 135 - 145 mEq/L    Potassium 3.6  3.5 - 5.1 mEq/L    Chloride 93 (*) 96 - 112 mEq/L    CO2 30  19 - 32 mEq/L    Glucose, Bld 162 (*) 70 - 99 mg/dL    BUN 15  6 - 23 mg/dL    Creatinine, Ser 5.40  0.50 - 1.35 mg/dL    Calcium 9.2  8.4 - 98.1 mg/dL    GFR calc non Af Amer 78 (*) >90 mL/min    GFR calc Af Amer 90 (*) >90 mL/min   CBC WITH DIFFERENTIAL     Status: Abnormal   Collection Time   03/01/12  8:00 PM      Component Value Range Comment   WBC 4.7  4.0 - 10.5 K/uL    RBC 3.39 (*) 4.22 - 5.81 MIL/uL    Hemoglobin 10.4 (*) 13.0 - 17.0 g/dL    HCT 19.1 (*)  47.8 - 52.0 %    MCV 89.7  78.0 - 100.0 fL    MCH 30.7  26.0 - 34.0 pg    MCHC 34.2  30.0 - 36.0 g/dL  RDW 15.7 (*) 11.5 - 15.5 %    Platelets 246  150 - 400 K/uL    Neutrophils Relative 85 (*) 43 - 77 %    Neutro Abs 4.0  1.7 - 7.7 K/uL    Lymphocytes Relative 10 (*) 12 - 46 %    Lymphs Abs 0.5 (*) 0.7 - 4.0 K/uL    Monocytes Relative 5  3 - 12 %    Monocytes Absolute 0.2  0.1 - 1.0 K/uL    Eosinophils Relative 0  0 - 5 %    Eosinophils Absolute 0.0  0.0 - 0.7 K/uL    Basophils Relative 0  0 - 1 %    Basophils Absolute 0.0  0.0 - 0.1 K/uL     Ct Abdomen Pelvis W Contrast  03/01/2012  *RADIOLOGY REPORT*  Clinical Data: Abdominal pain and vomiting.  Status post hospitalization for bowel obstruction 3 weeks ago.  CT ABDOMEN AND PELVIS WITH CONTRAST  Technique:  Multidetector CT imaging of the abdomen and pelvis was performed following the standard protocol during bolus administration of intravenous contrast.  Contrast: OMNIPAQUE IOHEXOL 300 MG/ML  SOLN  Comparison: CT abdomen and pelvis 01/11/2012,12/27/2011, 04/09/2011 and 10/20/2010.  Chest and two views abdomen 03/01/2012 at 2020 hours.  Findings: The lung bases are clear.  No pleural or pericardial effusion.  The patient has a small bowel obstruction with loops dilated up to 4.2 cm.  No free intraperitoneal air, pneumatosis or portal venous gas is identified.  The transition point is in the left lower quadrant where a kinked loop of small bowel is identified and there is transition to decompressed loops.  Small amount of interloop fluid is noted.  Left lower quadrant colostomy is noted.  Marked wall thickening and enhancement of the patient's rectal stump is again seen and does not appear changed since the most recent study.  Descending abdominal aortic aneurysm measuring 4.1 by 4.4 cm appears unchanged.  The gallbladder, spleen, adrenal glands, pancreas and kidneys appear normal.  Small low attenuating lesions in the liver are  unchanged. No focal bony abnormality is identified.  IMPRESSION:  1.  Study is positive for small bowel obstruction with the transition point in the right lower quadrant.  Obstruction appears to be due to adhesions. 2.  No change in appearance of recurrent/residual rectal carcinoma since the most recent CT. 3.  Unchanged 4.4 cm abdominal aortic aneurysm.   Original Report Authenticated By: Bernadene Bell. D'ALESSIO, M.D.    Dg Abd Acute W/chest  03/01/2012  *RADIOLOGY REPORT*  Clinical Data: Abdominal pain.  Rule out obstruction  ACUTE ABDOMEN SERIES (ABDOMEN 2 VIEW & CHEST 1 VIEW)  Comparison: 01/13/2012  Findings: Port-A-Cath tip in the SVC, unchanged.  Lungs are clear without infiltrate or effusion.  Negative for heart failure.  Mildly distended small bowel loops in the left upper quadrant with air-fluid levels.  Colon is decompressed.  Negative for pneumoperitoneum.  Prior laminectomy L5-S1 on the left.  IMPRESSION: No active cardiopulmonary disease.  Mild small bowel dilatation with air-fluid levels, improved from the  prior study.  This may represent early small bowel obstruction and continued follow-up is suggested.   Original Report Authenticated By: Camelia Phenes, M.D.     Review of Systems  Constitutional: Negative for fever and chills.  Respiratory: Negative for shortness of breath.   Cardiovascular: Negative for chest pain.  Gastrointestinal: Positive for nausea, vomiting and abdominal pain.   Blood pressure 131/77, pulse 70, temperature 97.8 F (36.6  C), temperature source Oral, resp. rate 18, SpO2 100.00%. Physical Exam General: Elderly alert pleasant Caucasian male who does not appear in distress Skin: Atrophic, no rash or infection HEENT: Sclerae are nonicteric. Pupils are active. No palpable masses. Lymph nodes: No palpable cervical or supraclavicular or inguinal nodes Lungs: Mild expiratory wheezing. No increased work of breathing. Port-A-Cath present left chest wall. Cardiac:  Irregular. Trace ankle edema. No JVD. Abdomen: Mildly distended. Bowel sounds are hypoactive. There is a healthy colostomy in the left lower quadrant without stool. There may be a mild diffuse peristomal hernia. There is mild tenderness in the left lower quadrant and around the colostomy. Remainder of his abdomen is nontender. No guarding. Well-healed midline incision without hernias. Extremities: Trace edema. Neurologic: Alert and fully oriented. No gross motor deficits  Assessment/Plan: 78 year old male status post colostomy and partial resection of locally advanced rectal cancer in 2011. He has known pelvic cancer and is currently undergoing chemotherapy. He has multiple medical problems including diabetes,atrial fibrillation on chronic anticoagulation and hypertension. He presents with a small bowel obstruction that appears to be secondary to adhesions rather than malignancy by CT scan. The patient is currently much more comfortable after NG placement of a large amount of bilious drainage. His abdomen is generally benign on exam. Continue conservative management with NG drainage and IV fluids and close followup. Hold Coumadin. We will follow with you.  Breanah Faddis T 03/01/2012, 11:43 PM

## 2012-03-02 ENCOUNTER — Encounter (HOSPITAL_COMMUNITY): Payer: Self-pay | Admitting: Family Medicine

## 2012-03-02 DIAGNOSIS — D62 Acute posthemorrhagic anemia: Secondary | ICD-10-CM

## 2012-03-02 DIAGNOSIS — I10 Essential (primary) hypertension: Secondary | ICD-10-CM

## 2012-03-02 DIAGNOSIS — I714 Abdominal aortic aneurysm, without rupture, unspecified: Secondary | ICD-10-CM

## 2012-03-02 HISTORY — DX: Abdominal aortic aneurysm, without rupture: I71.4

## 2012-03-02 HISTORY — DX: Abdominal aortic aneurysm, without rupture, unspecified: I71.40

## 2012-03-02 LAB — CBC
MCH: 30.5 pg (ref 26.0–34.0)
MCHC: 33.9 g/dL (ref 30.0–36.0)
MCV: 90 fL (ref 78.0–100.0)
Platelets: 188 10*3/uL (ref 150–400)

## 2012-03-02 LAB — BASIC METABOLIC PANEL
BUN: 14 mg/dL (ref 6–23)
CO2: 31 mEq/L (ref 19–32)
Calcium: 8.6 mg/dL (ref 8.4–10.5)
Creatinine, Ser: 0.96 mg/dL (ref 0.50–1.35)
GFR calc non Af Amer: 79 mL/min — ABNORMAL LOW (ref >90)
Glucose, Bld: 129 mg/dL — ABNORMAL HIGH (ref 70–99)
Sodium: 129 mEq/L — ABNORMAL LOW (ref 135–145)

## 2012-03-02 LAB — DIGOXIN LEVEL: Digoxin Level: 0.4 ng/mL — ABNORMAL LOW (ref 0.8–2.0)

## 2012-03-02 MED ORDER — ONDANSETRON HCL 4 MG/2ML IJ SOLN
4.0000 mg | Freq: Four times a day (QID) | INTRAMUSCULAR | Status: DC | PRN
  Administered 2012-03-02 – 2012-03-05 (×5): 4 mg via INTRAVENOUS
  Filled 2012-03-02 (×6): qty 2

## 2012-03-02 MED ORDER — CHLORHEXIDINE GLUCONATE 0.12 % MT SOLN
15.0000 mL | Freq: Two times a day (BID) | OROMUCOSAL | Status: DC
  Administered 2012-03-02 – 2012-03-13 (×20): 15 mL via OROMUCOSAL
  Filled 2012-03-02 (×29): qty 15

## 2012-03-02 MED ORDER — SODIUM CHLORIDE 0.9 % IV SOLN
INTRAVENOUS | Status: AC
  Administered 2012-03-02: 125 mL via INTRAVENOUS

## 2012-03-02 MED ORDER — BIOTENE DRY MOUTH MT LIQD
15.0000 mL | Freq: Two times a day (BID) | OROMUCOSAL | Status: DC
  Administered 2012-03-02 – 2012-03-13 (×18): 15 mL via OROMUCOSAL

## 2012-03-02 MED ORDER — HYDROMORPHONE HCL PF 1 MG/ML IJ SOLN
1.0000 mg | Freq: Once | INTRAMUSCULAR | Status: AC
  Administered 2012-03-02: 1 mg via INTRAVENOUS
  Filled 2012-03-02: qty 1

## 2012-03-02 MED ORDER — SODIUM CHLORIDE 0.9 % IV SOLN
INTRAVENOUS | Status: DC
  Administered 2012-03-02: 125 mL via INTRAVENOUS
  Administered 2012-03-03 (×2): via INTRAVENOUS
  Administered 2012-03-03: 125 mL via INTRAVENOUS
  Administered 2012-03-04: 06:00:00 via INTRAVENOUS

## 2012-03-02 MED ORDER — HYDROMORPHONE HCL PF 2 MG/ML IJ SOLN
2.0000 mg | INTRAMUSCULAR | Status: DC | PRN
  Administered 2012-03-02 – 2012-03-05 (×15): 2 mg via INTRAVENOUS
  Administered 2012-03-07 – 2012-03-08 (×2): 1 mg via INTRAVENOUS
  Filled 2012-03-02 (×17): qty 1

## 2012-03-02 MED ORDER — IPRATROPIUM-ALBUTEROL 20-100 MCG/ACT IN AERS
1.0000 | INHALATION_SPRAY | RESPIRATORY_TRACT | Status: DC | PRN
Start: 2012-03-02 — End: 2012-03-14
  Administered 2012-03-04 – 2012-03-11 (×2): 1 via RESPIRATORY_TRACT
  Filled 2012-03-02 (×2): qty 4

## 2012-03-02 MED ORDER — ONDANSETRON HCL 4 MG PO TABS
4.0000 mg | ORAL_TABLET | Freq: Four times a day (QID) | ORAL | Status: DC | PRN
  Administered 2012-03-12: 4 mg via ORAL
  Filled 2012-03-02: qty 1

## 2012-03-02 MED ORDER — IPRATROPIUM-ALBUTEROL 18-103 MCG/ACT IN AERO: 2.0000 | INHALATION_SPRAY | RESPIRATORY_TRACT | Status: DC | PRN

## 2012-03-02 MED ORDER — ALBUTEROL SULFATE (5 MG/ML) 0.5% IN NEBU
2.5000 mg | INHALATION_SOLUTION | RESPIRATORY_TRACT | Status: DC | PRN
  Administered 2012-03-02: 2.5 mg via RESPIRATORY_TRACT
  Filled 2012-03-02: qty 0.5

## 2012-03-02 NOTE — Progress Notes (Signed)
Patient ID: Vincent [name], male   DOB: 1933/02/02, 78 y.o.   MRN: [id number]    Subjective: Patient feels significantly better since admission an NG tube placed. He continues to have however some mild intermittent lower abdominal pain. Nothing much out of his colostomy.  Objective: Vital signs in last 24 hours: Temp:  [97.5 F (36.4 C)-98.6 F (37 C)] 97.5 F (36.4 C) (10/30 0548) Pulse Rate:  [62-72] 62  (10/30 0548) Resp:  [18-20] 20  (10/30 0548) BP: (126-138)/(62-79) 138/79 mmHg (10/30 0548) SpO2:  [94 %-100 %] 100 % (10/30 0548) Weight:  [188 lb 7.9 oz (85.5 kg)] 188 lb 7.9 oz (85.5 kg) (10/30 0140)    Intake/Output from previous day: 10/29 0701 - 10/30 0700 In: 0  Out: 800 [Urine:650; Emesis/NG output:150] Intake/Output this shift: Total I/O In: 0  Out: 150 [Urine:150]  General appearance: alert, cooperative and no distress GI: he remains mildly tender in his low midline and around the stoma without guarding. No significant distention. Ostomy bag is empty.  Lab Results:   Basename 03/02/12 0250 03/01/12 2000  WBC 3.5* 4.7  HGB 10.1* 10.4*  HCT 29.8* 30.4*  PLT 188 246   BMET  Basename 03/02/12 0250 03/01/12 2000  NA 129* 131*  K 3.8 3.6  CL 93* 93*  CO2 31 30  GLUCOSE 129* 162*  BUN 14 15  CREATININE 0.96 0.99  CALCIUM 8.6 9.2     Studies/Results: Ct Abdomen Pelvis W Contrast  03/01/2012  *RADIOLOGY REPORT*  Clinical Data: Abdominal pain and vomiting.  Status post hospitalization for bowel obstruction 3 weeks ago.  CT ABDOMEN AND PELVIS WITH CONTRAST  Technique:  Multidetector CT imaging of the abdomen and pelvis was performed following the standard protocol during bolus administration of intravenous contrast.  Contrast: OMNIPAQUE IOHEXOL 300 MG/ML  SOLN  Comparison: CT abdomen and pelvis 01/11/2012,12/27/2011, 04/09/2011 and 10/20/2010.  Chest and two views abdomen 03/01/2012 at 2020 hours.  Findings: The lung bases are clear.  No pleural or  pericardial effusion.  The patient has a small bowel obstruction with loops dilated up to 4.2 cm.  No free intraperitoneal air, pneumatosis or portal venous gas is identified.  The transition point is in the left lower quadrant where a kinked loop of small bowel is identified and there is transition to decompressed loops.  Small amount of interloop fluid is noted.  Left lower quadrant colostomy is noted.  Marked wall thickening and enhancement of the patient's rectal stump is again seen and does not appear changed since the most recent study.  Descending abdominal aortic aneurysm measuring 4.1 by 4.4 cm appears unchanged.  The gallbladder, spleen, adrenal glands, pancreas and kidneys appear normal.  Small low attenuating lesions in the liver are unchanged. No focal bony abnormality is identified.  IMPRESSION:  1.  Study is positive for small bowel obstruction with the transition point in the right lower quadrant.  Obstruction appears to be due to adhesions. 2.  No change in appearance of recurrent/residual rectal carcinoma since the most recent CT. 3.  Unchanged 4.4 cm abdominal aortic aneurysm.   Original Report Authenticated By: Bernadene Bell. D'ALESSIO, M.D.    Dg Abd Acute W/chest  03/01/2012  *RADIOLOGY REPORT*  Clinical Data: Abdominal pain.  Rule out obstruction  ACUTE ABDOMEN SERIES (ABDOMEN 2 VIEW & CHEST 1 VIEW)  Comparison: 01/13/2012  Findings: Port-A-Cath tip in the SVC, unchanged.  Lungs are clear without infiltrate or effusion.  Negative for heart failure.  Mildly  distended small bowel loops in the left upper quadrant with air-fluid levels.  Colon is decompressed.  Negative for pneumoperitoneum.  Prior laminectomy L5-S1 on the left.  IMPRESSION: No active cardiopulmonary disease.  Mild small bowel dilatation with air-fluid levels, improved from the  prior study.  This may represent early small bowel obstruction and continued follow-up is suggested.   Original Report Authenticated By: Camelia Phenes,  M.D.     Anti-infectives: Anti-infectives    None      Assessment/Plan: Small bowel obstruction, likely secondary to adhesions, with known pelvic malignancy. He is feeling significantly better with NG and bowel rest. He likely has not completely resolved but is clinically very stable. Continue current management. Will repeat abdominal x-rays tomorrow morning.    LOS: 1 day    Kati Riggenbach T 03/02/2012

## 2012-03-02 NOTE — Progress Notes (Signed)
INITIAL ADULT NUTRITION ASSESSMENT Date: 03/02/2012   Time: 11:14 AM Reason for Assessment: Nutrition risk    INTERVENTION: Diet advancement per MD. Will monitor.   ASSESSMENT: Male 78 y.o.  Dx: SBO (small bowel obstruction)  Food/Nutrition Related Hx: Pt with colon CA s/p resection and colostomy and rectal CA. Pt admitted with nausea, vomiting, and abdominal pain that started 1 day PTA and was found to have small bowel obstruction. Pt admitted with small obstruction last month. Pt reports PTA he was eating everything he wanted with excellent appetite. Pt's weight down 19 pounds in the past 2 months which pt attributes to being in and out of the hospital and sometimes being unable to eat r/t NGT for a few days past admission for small bowel obstruction. Pt with NGT in place. Pt reports last emesis was yesterday, just dry heaves today.   Hx:  Past Medical History  Diagnosis Date  . Hypertension   . Arthritis   . Heart disease   . SOB (shortness of breath)   . Malignant neoplasm of hepatic flexure   . Malignant neoplasm of rectosigmoid junction   . Cancer     and/or colon polyp  . Malignant neoplasm of hepatic flexure   . Malignant neoplasm of rectosigmoid junction   . Diabetes mellitus   . Atrial fibrillation   . Colostomy care   . AAA (abdominal aortic aneurysm) without rupture 03/02/2012   Related Meds:  Scheduled Meds:   . sodium chloride   Intravenous STAT  . antiseptic oral rinse  15 mL Mouth Rinse q12n4p  . chlorhexidine  15 mL Mouth Rinse BID  .  HYDROmorphone (DILAUDID) injection  1 mg Intravenous Once  .  HYDROmorphone (DILAUDID) injection  1 mg Intravenous Once  .  HYDROmorphone (DILAUDID) injection  1 mg Intravenous Once  . ondansetron (ZOFRAN) IV  4 mg Intravenous Once  . ondansetron (ZOFRAN) IV  4 mg Intravenous Once   Continuous Infusions:   . sodium chloride 125 mL (03/02/12 0403)  . DISCONTD: sodium chloride Stopped (03/02/12 0021)   PRN  Meds:.albuterol, HYDROmorphone (DILAUDID) injection, iohexol, ondansetron (ZOFRAN) IV, ondansetron  Ht: 5\' 11"  (180.3 cm)  Wt: 188 lb 7.9 oz (85.5 kg)  Ideal Wt: 172 lb % Ideal Wt: 109  Usual Wt: 207 lb in August 2013 % Usual Wt: 91  Wt Readings from Last 10 Encounters:  03/02/12 188 lb 7.9 oz (85.5 kg)  02/24/12 186 lb 4.8 oz (84.505 kg)  02/10/12 188 lb 12.8 oz (85.639 kg)  01/27/12 187 lb 9.6 oz (85.095 kg)  01/11/12 194 lb 0.1 oz (88 kg)  01/06/12 196 lb 4.8 oz (89.041 kg)  12/18/11 207 lb 3.2 oz (93.985 kg)  12/18/11 207 lb 3.2 oz (93.985 kg)  12/18/11 207 lb 3.2 oz (93.985 kg)  12/18/11 207 lb 3.2 oz (93.985 kg)    Body mass index is 26.29 kg/(m^2).   Labs:  CMP     Component Value Date/Time   NA 129* 03/02/2012 0250   NA 138 02/24/2012 0803   NA 133 04/09/2011 0838   K 3.8 03/02/2012 0250   K 3.7 02/24/2012 0803   K 4.6 04/09/2011 0838   CL 93* 03/02/2012 0250   CL 102 02/24/2012 0803   CL 98 04/09/2011 0838   CO2 31 03/02/2012 0250   CO2 26 02/24/2012 0803   CO2 31 04/09/2011 0838   GLUCOSE 129* 03/02/2012 0250   GLUCOSE 145* 02/24/2012 0803   GLUCOSE 136* 04/09/2011 1478  BUN 14 03/02/2012 0250   BUN 17.0 02/24/2012 0803   BUN 18 04/09/2011 0838   CREATININE 0.96 03/02/2012 0250   CREATININE 1.0 02/24/2012 0803   CREATININE 1.3* 04/09/2011 0838   CALCIUM 8.6 03/02/2012 0250   CALCIUM 9.0 02/24/2012 0803   CALCIUM 8.8 04/09/2011 0838   PROT 6.0* 02/24/2012 0803   PROT 5.6* 01/15/2012 0438   PROT 6.5 04/09/2011 0838   ALBUMIN 3.0* 02/24/2012 0803   ALBUMIN 2.2* 01/15/2012 0438   AST 12 02/24/2012 0803   AST 11 01/15/2012 0438   AST 18 04/09/2011 0838   ALT 15 02/24/2012 0803   ALT 7 01/15/2012 0438   ALKPHOS 78 02/24/2012 0803   ALKPHOS 66 01/15/2012 0438   ALKPHOS 67 04/09/2011 0838   BILITOT 0.50 02/24/2012 0803   BILITOT 0.6 01/15/2012 0438   BILITOT 0.70 04/09/2011 0838   GFRNONAA 79* 03/02/2012 0250   GFRAA >90 03/02/2012 0250   Lab Results    Component Value Date   HGBA1C  Value: 6.0 (NOTE)                                                                       According to the ADA Clinical Practice Recommendations for 2011, when HbA1c is used as a screening test:   >=6.5%   Diagnostic of Diabetes Mellitus           (if abnormal result  is confirmed)  5.7-6.4%   Increased risk of developing Diabetes Mellitus  References:Diagnosis and Classification of Diabetes Mellitus,Diabetes Care,2011,34(Suppl 1):S62-S69 and Standards of Medical Care in         Diabetes - 2011,Diabetes Care,2011,34  (Suppl 1):S11-S61.* 02/07/2010   CBG (last 3)  No results found for this basename: GLUCAP:3 in the last 72 hours   Intake/Output Summary (Last 24 hours) at 03/02/12 1121 Last data filed at 03/02/12 1002  Gross per 24 hour  Intake    500 ml  Output   1050 ml  Net   -550 ml   Last BM - PTA  NGT - total yesterday, brown in color  Diet Order: NPO   IVF:    sodium chloride Last Rate: 125 mL (03/02/12 0403)  DISCONTD: sodium chloride Last Rate: Stopped (03/02/12 0021)    Estimated Nutritional Needs:   Kcal:2150-2300 Protein:100-120g Fluid:2.1-2.3L  NUTRITION DIAGNOSIS: -Inadequate oral intake (NI-2.1).  Status: Ongoing  RELATED TO: inability to eat  AS EVIDENCE BY: NPO  MONITORING/EVALUATION(Goals): Advance diet as tolerated to diabetic diet.  EDUCATION NEEDS: -No education needs identified at this time   Dietitian #: [phone]  DOCUMENTATION CODES Per approved criteria  -Not Applicable    Marshall Cork 03/02/2012, 11:14 AM

## 2012-03-02 NOTE — Care Management Note (Signed)
    Page 1 of 2   03/14/2012     12:45:32 PM   CARE MANAGEMENT NOTE 03/14/2012  Patient:  Vincent [name], Vincent [name]   Account Number:  [id number]  Date Initiated:  03/02/2012  Documentation initiated by:  Lorenda Ishihara  Subjective/Objective Assessment:   78 yo male admitted with SBO, hx stage 4 rectal cancer currently treated with chemo. PTA lived at home with spouse.     Action/Plan:   home when stable   Anticipated DC Date:  03/11/2012   Anticipated DC Plan:  HOME/SELF CARE      DC Planning Services  CM consult      [facility] Choice  HOME HEALTH   Choice offered to / List presented to:  C-1 Patient        HH arranged  HH-1 RN  HH-2 PT      HH agency  [facility]   Status of service:  Completed, signed off Medicare Important Message given?   (If response is "NO", the following Medicare IM given date fields will be blank) Date Medicare IM given:   Date Additional Medicare IM given:    Discharge Disposition:  HOME W HOME HEALTH SERVICES  Per UR Regulation:  Reviewed for med. necessity/level of care/duration of stay  If discussed at Long Length of Stay Meetings, dates discussed:   03/10/2012  03/08/2012    Comments:  03/14/2012  12:00am  Konrad Felix RN, case mgr.   [phone] Patient was transferred from critical care to med surg floor. Consult noted for home health services. According to patient, he prefers to use [place], as he has in the past. I contacted Eunice Blase of [facility] who will initiate the service.  [phone] Marcelle Smiling, RN, BSN, CCM No discharge needs present at time of this review Case Management [phone]

## 2012-03-02 NOTE — Progress Notes (Signed)
[facility] PROGRESS NOTE  Vincent [name] UJW:[id number] DOB: Dec 31, 1932 DOA: 03/01/2012 PCP: Aura Dials, MD  Brief narrative: 78 year old male with previous history of rectal carcinoma (diagnosed in 2011) status post surgical resection and colostomy, radiation and chemotherapy,  recurrent carcinoma with nodal metastases based on CT abdomen/pelvis findings. Patient was admitted for small bowel obstruction.  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  Objective: Filed Vitals:   03/02/12 0548 03/02/12 0948 03/02/12 1000 03/02/12 1400  BP: 138/79  142/77 132/61  Pulse: 62  70 72  Temp: 97.5 F (36.4 C)  97.9 F (36.6 C) 98 F (36.7 C)  TempSrc: Oral  Oral Oral  Resp: 20  18 20   Height:      Weight:      SpO2: 100% 94% 100% 100%    Intake/Output Summary (Last 24 hours) at 03/02/12 1631 Last data filed at 03/02/12 1500  Gross per 24 hour  Intake   1000 ml  Output   1800 ml  Net   -800 ml    Exam:   General:  Pt is alert, follows commands appropriately, not in acute distress  Cardiovascular: irregular rhythm, rate controlled, S1/S2, no murmurs, no rubs, no  gallops  Respiratory: Clear to auscultation bilaterally, no wheezing, no crackles, no rhonchi  Abdomen: Soft, non tender, non distended, bowel sounds present, no guarding  Extremities: LE edema, pulses DP and PT palpable bilaterally  Neuro: Grossly nonfocal  Data Reviewed: Basic Metabolic Panel:  Lab 03/02/12 6578 03/01/12 2000  NA 129* 131*  K 3.8 3.6  CL 93* 93*  CO2 31 30  GLUCOSE 129* 162*  BUN 14 15  CREATININE 0.96 0.99  CALCIUM 8.6 9.2   CBC:   Lab 03/02/12 0250 03/01/12 2000  WBC 3.5* 4.7  HGB 10.1* 10.4*  HCT 29.8* 30.4*  MCV 90.0 89.7  PLT 188 246    Studies: Ct Abdomen Pelvis W Contrast 03/01/2012  * IMPRESSION:  1.  Study is positive for small bowel obstruction with the transition point in the right lower quadrant.  Obstruction appears to be due to adhesions. 2.  No change in appearance of recurrent/residual rectal carcinoma since the most recent CT. 3.  Unchanged 4.4 cm abdominal aortic aneurysm.   Original Report Authenticated By: Bernadene Bell. D'ALESSIO, M.D.    Dg Abd Acute W/chest  03/01/2012  * IMPRESSION: No active cardiopulmonary disease.  Mild small bowel dilatation with air-fluid levels, improved from the  prior study.  This may represent early small bowel obstruction and continued follow-up is suggested.   Original Report Authenticated By: Camelia Phenes, M.D.

## 2012-03-03 ENCOUNTER — Inpatient Hospital Stay (HOSPITAL_COMMUNITY): Payer: Medicare Other

## 2012-03-03 DIAGNOSIS — I4891 Unspecified atrial fibrillation: Secondary | ICD-10-CM

## 2012-03-03 DIAGNOSIS — E871 Hypo-osmolality and hyponatremia: Secondary | ICD-10-CM

## 2012-03-03 LAB — CBC
Hemoglobin: 9.7 g/dL — ABNORMAL LOW (ref 13.0–17.0)
MCHC: 34 g/dL (ref 30.0–36.0)
RBC: 3.14 MIL/uL — ABNORMAL LOW (ref 4.22–5.81)
WBC: 3.9 10*3/uL — ABNORMAL LOW (ref 4.0–10.5)

## 2012-03-03 LAB — BASIC METABOLIC PANEL
GFR calc non Af Amer: 80 mL/min — ABNORMAL LOW (ref >90)
Glucose, Bld: 104 mg/dL — ABNORMAL HIGH (ref 70–99)
Potassium: 3.5 mEq/L (ref 3.5–5.1)
Sodium: 130 mEq/L — ABNORMAL LOW (ref 135–145)

## 2012-03-03 MED ORDER — KETOROLAC TROMETHAMINE 15 MG/ML IJ SOLN
15.0000 mg | Freq: Four times a day (QID) | INTRAMUSCULAR | Status: DC | PRN
  Administered 2012-03-03 (×2): 15 mg via INTRAVENOUS
  Filled 2012-03-03 (×2): qty 1

## 2012-03-03 MED ORDER — ACETAMINOPHEN 160 MG/5ML PO SOLN
650.0000 mg | Freq: Four times a day (QID) | ORAL | Status: DC | PRN
  Administered 2012-03-03: 650 mg via ORAL
  Filled 2012-03-03 (×2): qty 20.3

## 2012-03-03 NOTE — Progress Notes (Signed)
Patient ID: Vincent [name], male   DOB: August 28, 1932, 78 y.o.   MRN: [id number] Patient ID: Vincent [name], male   DOB: Oct 22, 1932, 78 y.o.   MRN: [id number]    Subjective: Patient feels much better today. Still with mild intermittent lower abdominal pain/nausea. Ostomy output not recorded. NG (740ml/24 hrs)  Objective: Vital signs in last 24 hours: Temp:  [98 F (36.7 C)] 98 F (36.7 C) (10/31 0602) Pulse Rate:  [72-78] 78  (10/31 0602) Resp:  [18-20] 20  (10/31 0700) BP: (132-157)/(61-81) 133/71 mmHg (10/31 0602) SpO2:  [99 %-100 %] 99 % (10/31 0602)    Intake/Output from previous day: 10/30 0701 - 10/31 0700 In: 1090 [I.V.:1000; NG/GT:90] Out: 2550 [Urine:1800; Emesis/NG output:750] Intake/Output this shift: Total I/O In: 30 [NG/GT:30] Out: 600 [Urine:600]  General appearance: A/A/O Generally feels better, intermittent HA and mild abdominal pain with associated nausea (? Cause of this)  Abd films: Findings: Mildly dilated loops of small bowel in the left mid/after  abdomen suggesting at least partial small bowel obstruction.  Appearance is grossly unchanged when compared to prior topogram.  No evidence of free air on the lateral decubitus view.  Enteric tube with its tip in the gastric cardia and side port in  the distal esophagus.  IMPRESSION:  Small bowel obstruction, grossly unchanged.  Enteric tube with its tip in the gastric cardia and side port in  the distal esophagus. Consider advancement.  Chest: Wheezes Bilaterally (chronic on Spireva) Cardiac: RRR Abdomen: flat, +BS, ostomy (750 ml/24hrs Nothing in bag at present) intermittent nausea c/o. ? Medication related. Extremities: warm, +pulses, no edema or tenderness. Labs: WBC wnl H&H stable, remains hyponatremic (? Cause of headache) slowly improving; Bun and Creatinine improving with IVF.   Lab Results:   [facility] 03/03/12 0429 03/02/12 0250  WBC 3.9* 3.5*  HGB 9.7* 10.1*  HCT 28.5* 29.8*  PLT 178 188    BMET  Basename 03/03/12 0429 03/02/12 0250  NA 130* 129*  K 3.5 3.8  CL 94* 93*  CO2 30 31  GLUCOSE 104* 129*  BUN 10 14  CREATININE 0.92 0.96  CALCIUM 8.8 8.6     Studies/Results: Ct Abdomen Pelvis W Contrast  03/01/2012  *RADIOLOGY REPORT*  Clinical Data: Abdominal pain and vomiting.  Status post hospitalization for bowel obstruction 3 weeks ago.  CT ABDOMEN AND PELVIS WITH CONTRAST  Technique:  Multidetector CT imaging of the abdomen and pelvis was performed following the standard protocol during bolus administration of intravenous contrast.  Contrast: OMNIPAQUE IOHEXOL 300 MG/ML  SOLN  Comparison: CT abdomen and pelvis 01/11/2012,12/27/2011, 04/09/2011 and 10/20/2010.  Chest and two views abdomen 03/01/2012 at 2020 hours.  Findings: The lung bases are clear.  No pleural or pericardial effusion.  The patient has a small bowel obstruction with loops dilated up to 4.2 cm.  No free intraperitoneal air, pneumatosis or portal venous gas is identified.  The transition point is in the left lower quadrant where a kinked loop of small bowel is identified and there is transition to decompressed loops.  Small amount of interloop fluid is noted.  Left lower quadrant colostomy is noted.  Marked wall thickening and enhancement of the patient's rectal stump is again seen and does not appear changed since the most recent study.  Descending abdominal aortic aneurysm measuring 4.1 by 4.4 cm appears unchanged.  The gallbladder, spleen, adrenal glands, pancreas and kidneys appear normal.  Small low attenuating lesions in the liver are unchanged. No focal bony  abnormality is identified.  IMPRESSION:  1.  Study is positive for small bowel obstruction with the transition point in the right lower quadrant.  Obstruction appears to be due to adhesions. 2.  No change in appearance of recurrent/residual rectal carcinoma since the most recent CT. 3.  Unchanged 4.4 cm abdominal aortic aneurysm.   Original Report  Authenticated By: Bernadene Bell. D'ALESSIO, M.D.    Dg Abd Acute W/chest  03/01/2012  *RADIOLOGY REPORT*  Clinical Data: Abdominal pain.  Rule out obstruction  ACUTE ABDOMEN SERIES (ABDOMEN 2 VIEW & CHEST 1 VIEW)  Comparison: 01/13/2012  Findings: Port-A-Cath tip in the SVC, unchanged.  Lungs are clear without infiltrate or effusion.  Negative for heart failure.  Mildly distended small bowel loops in the left upper quadrant with air-fluid levels.  Colon is decompressed.  Negative for pneumoperitoneum.  Prior laminectomy L5-S1 on the left.  IMPRESSION: No active cardiopulmonary disease.  Mild small bowel dilatation with air-fluid levels, improved from the  prior study.  This may represent early small bowel obstruction and continued follow-up is suggested.   Original Report Authenticated By: Camelia Phenes, M.D.    Dg Abd Portable 2v  03/03/2012  *RADIOLOGY REPORT*  Clinical Data: Follow up small bowel obstruction  PORTABLE ABDOMEN - 2 VIEW  Comparison: CT abdomen pelvis dated 03/01/2012  Findings: Mildly dilated loops of small bowel in the left mid/after abdomen suggesting at least partial small bowel obstruction. Appearance is grossly unchanged when compared to prior topogram.  No evidence of free air on the lateral decubitus view.  Enteric tube with its tip in the gastric cardia and side port in the distal esophagus.  IMPRESSION: Small bowel obstruction, grossly unchanged.  Enteric tube with its tip in the gastric cardia and side port in the distal esophagus.  Consider advancement.   Original Report Authenticated By: Charline Bills, M.D.     Anti-infectives: Anti-infectives    None     Patient Active Problem List  Diagnosis  . Rectal carcinoma  . Abdominal pain  . Thrombocytopenia  . Hyponatremia  . Weakness generalized  . Acute blood loss anemia  . Abscess of male pelvis  . SBO (small bowel obstruction)  . Anemia  . PAF (paroxysmal atrial fibrillation)  . DM (diabetes mellitus)  . HTN  (hypertension)  . Chest pain  . Acute urinary retention  . Nausea & vomiting  . AAA (abdominal aortic aneurysm) without rupture   Assessment/Plan: 1.Small bowel obstruction, likely secondary to adhesions, with known pelvic malignancy. 2.Possible gastroparesis secondary to DM? 3. Continue current management.  4. Advance NG tube, try clamping trial. 5.. ? Need for surgical exploration in next day or two if no clinical improvement. 6. Ambulate/oob   LOS: 2 days    Blenda Mounts ACNP [facility] Pager [phone]  03/03/2012   I think that he will need surgery since he has not shown any signs of ostomy output, however, his abdomen is soft, and nontender on exam. Pain free now but had some pain earlier today.  I talked with him about the pros/cons of surgery.  We will give him a little more time to see if he will manifest improvement or deterioration, but again, I am concerned that he will need some intervention.

## 2012-03-03 NOTE — Progress Notes (Signed)
[facility] PROGRESS NOTE  Vincent [name] ZOX:[id number] DOB: 07-16-1932 DOA: 03/01/2012 PCP: Aura Dials, MD  Brief narrative: 78 year old male with previous history of rectal carcinoma (diagnosed in 2011) status post surgical resection and colostomy, radiation and chemotherapy, recurrent carcinoma with nodal metastases based on CT abdomen/pelvis findings. Patient is admitted for small bowel obstruction.   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  Objective: Filed Vitals:   03/02/12 2141 03/03/12 0602 03/03/12 0700 03/03/12 1003  BP: 157/81 133/71  121/67  Pulse: 75 78  69  Temp: 98 F (36.7 C) 98 F (36.7 C)  97.7 F (36.5 C)  TempSrc: Oral Oral  Oral  Resp: 18 18 20 20   Height:      Weight:      SpO2: 100% 99%  100%    Intake/Output Summary (Last 24 hours) at 03/03/12 1218 Last data filed at 03/03/12 1000  Gross per 24 hour  Intake 2245.83 ml  Output   2800 ml  Net -554.17 ml    Exam:   General:  Pt is sleeping, no acute distress  Cardiovascular: irregular rhythm, rate controlled, S1/S2, no murmurs, no rubs, no gallops  Respiratory: Clear to auscultation  bilaterally, no wheezing, no crackles, no rhonchi  Abdomen: Soft, non tender, non distended, bowel sounds present, no guarding  Extremities: LE edema, pulses DP and PT palpable bilaterally  Neuro: Grossly nonfocal  Data Reviewed: Basic Metabolic Panel:  Lab 03/03/12 8295 03/02/12 0250 03/01/12 2000  NA 130* 129* 131*  K 3.5 3.8 3.6  CL 94* 93* 93*  CO2 30 31 30   GLUCOSE 104* 129* 162*  BUN 10 14 15   CREATININE 0.92 0.96 0.99  CALCIUM 8.8 8.6 9.2   CBC:  Lab 03/03/12 0429 03/02/12 0250 03/01/12 2000  WBC 3.9* 3.5* 4.7  HGB 9.7* 10.1* 10.4*  HCT 28.5* 29.8* 30.4*  MCV 90.8 90.0 89.7  PLT 178 188 246   Studies: Ct Abdomen Pelvis W Contrast 03/01/2012   IMPRESSION:  1.  Study is positive for small bowel obstruction with the transition point in the right lower quadrant.  Obstruction appears to be due to adhesions. 2.  No change in appearance of recurrent/residual rectal carcinoma since the most recent CT. 3.  Unchanged 4.4 cm abdominal aortic aneurysm.   Original Report Authenticated By: Bernadene Bell. D'ALESSIO, M.D.    Dg Abd Acute W/chest 03/01/2012  *  IMPRESSION: No active cardiopulmonary disease.  Mild small bowel dilatation with air-fluid levels, improved from the  prior study.  This may represent early small bowel obstruction and continued follow-up is suggested.     Dg Abd Portable 2v 03/03/2012  *  IMPRESSION: Small bowel obstruction, grossly unchanged.  Enteric tube with  its tip in the gastric cardia and side port in the distal esophagus.  Consider advancement.      Scheduled Meds:  . antiseptic oral rinse  15 mL Mouth Rinse q12n4p  . chlorhexidine  15 mL Mouth Rinse BID   Continuous Infusions:  . sodium chloride 125 mL (03/03/12 0443)

## 2012-03-04 ENCOUNTER — Inpatient Hospital Stay (HOSPITAL_COMMUNITY): Payer: Medicare Other

## 2012-03-04 LAB — BASIC METABOLIC PANEL
BUN: 16 mg/dL (ref 6–23)
Chloride: 96 mEq/L (ref 96–112)
GFR calc Af Amer: 90 mL/min — ABNORMAL LOW (ref >90)
Potassium: 3.5 mEq/L (ref 3.5–5.1)

## 2012-03-04 LAB — CBC
HCT: 28.7 % — ABNORMAL LOW (ref 39.0–52.0)
Platelets: 195 10*3/uL (ref 150–400)
RDW: 16.1 % — ABNORMAL HIGH (ref 11.5–15.5)
WBC: 3.5 10*3/uL — ABNORMAL LOW (ref 4.0–10.5)

## 2012-03-04 LAB — SURGICAL PCR SCREEN: MRSA, PCR: NEGATIVE

## 2012-03-04 LAB — PROTIME-INR: INR: 2.18 — ABNORMAL HIGH (ref 0.00–1.49)

## 2012-03-04 MED ORDER — KCL IN DEXTROSE-NACL 20-5-0.45 MEQ/L-%-% IV SOLN
INTRAVENOUS | Status: DC
  Administered 2012-03-04 – 2012-03-05 (×3): via INTRAVENOUS
  Filled 2012-03-04 (×5): qty 1000

## 2012-03-04 MED ORDER — PHENOL 1.4 % MT LIQD
1.0000 | OROMUCOSAL | Status: DC | PRN
  Administered 2012-03-04 – 2012-03-05 (×2): 1 via OROMUCOSAL
  Filled 2012-03-04: qty 177

## 2012-03-04 MED ORDER — SODIUM CHLORIDE 0.9 % IJ SOLN
10.0000 mL | INTRAMUSCULAR | Status: DC | PRN
  Administered 2012-03-04 – 2012-03-11 (×4): 10 mL

## 2012-03-04 NOTE — Progress Notes (Signed)
Per Kaleen Odea, RN Dr. Biagio Quint paged that INR back and family anxious about surgery.

## 2012-03-04 NOTE — Progress Notes (Signed)
Dr. Biagio Quint on floor to see patient.

## 2012-03-04 NOTE — Progress Notes (Signed)
Patient ID: NIL XIONG, male   DOB: [birth year], 78 y.o.   MRN: [id number] Patient ID: HARJOT DIBELLO, male   DOB: 12-10-1932, 78 y.o.   MRN: [id number] Patient ID: JOEVON HOLLIMAN, male   DOB: April 27, 1933, 78 y.o.   MRN: [id number]    Subjective: Patient feels better today. Still with mild intermittent lower abdominal pain. Still w/o any ostomy output. NG (1233ml/24 hrs).  Objective: Vital signs in last 24 hours: Temp:  [97.6 F (36.4 C)-98.5 F (36.9 C)] 97.6 F (36.4 C) (11/01 0557) Pulse Rate:  [69-80] 76  (11/01 0557) Resp:  [18-20] 18  (11/01 0557) BP: (114-148)/(65-75) 114/75 mmHg (11/01 0557) SpO2:  [90 %-100 %] 96 % (11/01 0557)    Intake/Output from previous day: 10/31 0701 - 11/01 0700 In: 4155.8 [I.V.:4095.8; NG/GT:60] Out: 2450 [Urine:1200; Emesis/NG output:1250] Intake/Output this shift: Total I/O In: 30 [NG/GT:30] Out: 0   General appearance: A/A/O Generally feels better, No mention of HA today, intermiitent mild abdominal pain w/o nausea continues. Chest: Improved BS today Cardiac: RRR Abdomen:  Non distended not tender. +BS, No ostomy output,  NG(1250 ml/24hrs) Extremities: warm, +pulses, no edema or tenderness. Labs: Bun and creat up slightly, WBC wnl. H&H,plts stable.   Lab Results:   [facility] 03/04/12 0429 03/03/12 0429  WBC 3.5* 3.9*  HGB 9.5* 9.7*  HCT 28.7* 28.5*  PLT 195 178   BMET  Basename 03/04/12 0429 03/03/12 0429  NA 132* 130*  K 3.5 3.5  CL 96 94*  CO2 26 30  GLUCOSE 77 104*  BUN 16 10  CREATININE 0.99 0.92  CALCIUM 8.8 8.8     Studies/Results: Dg Abd Portable 2v  03/03/2012  *RADIOLOGY REPORT*  Clinical Data: Follow up small bowel obstruction  PORTABLE ABDOMEN - 2 VIEW  Comparison: CT abdomen pelvis dated 03/01/2012  Findings: Mildly dilated loops of small bowel in the left mid/after abdomen suggesting at least partial small bowel obstruction. Appearance is grossly unchanged when compared to prior topogram.  No evidence of free  air on the lateral decubitus view.  Enteric tube with its tip in the gastric cardia and side port in the distal esophagus.  IMPRESSION: Small bowel obstruction, grossly unchanged.  Enteric tube with its tip in the gastric cardia and side port in the distal esophagus.  Consider advancement.   Original Report Authenticated By: Charline Bills, M.D.     Anti-infectives: Anti-infectives    None     Patient Active Problem List  Diagnosis  . Rectal carcinoma  . Abdominal pain  . Thrombocytopenia  . Hyponatremia  . Weakness generalized  . Acute blood loss anemia  . Abscess of male pelvis  . SBO (small bowel obstruction)  . Anemia  . PAF (paroxysmal atrial fibrillation)  . DM (diabetes mellitus)  . HTN (hypertension)  . Chest pain  . Acute urinary retention  . Nausea & vomiting  . AAA (abdominal aortic aneurysm) without rupture   Assessment/Plan: 1.Small bowel obstruction, likely secondary to adhesions, with known pelvic malignancy. 2. Continue current management.  3. ? Need for surgical exploration as there has been no change in clinical picture. Patient states that he understands the need for possible intervention, and is comfortable with that scenario if needed. 4. Ambulate/oob   LOS: 3 days    Blenda Mounts [facility] Surgery Pager [phone]  03/04/2012   Still no ostomy output.  He says that he feels better but no gas or stool in bag.  bilious output  in NG.  His abdomen is soft, and nontender and really does not seem distended.  He is not having fevers and again his pain is improved.  However, I am concerned that he still has not had any ostomy output.  I think that surgery will be necessary for lack of improvement.  His INR is still >2 this morning.  We will check abdominal xrays and if no improvement, then I would recommend surgery and LOA.  Will touch base with the hospitalist to determine if we can give FFP and do procedure today or if vitamin K and plan  for OR tomorrow??

## 2012-03-04 NOTE — Progress Notes (Signed)
Spoke with Larena Sox and Maralyn Sago [facility] states to run FFP for 25ml/hr for the first 15 min then at per hr but to stay with patient for duration of infusion. I stayed and monitored patient the entire duration of infusion and had pateint on phillips dinamap with o2 monitoring and waveform.

## 2012-03-04 NOTE — Progress Notes (Signed)
Spoke to [place] NP for [facility], informed him that first unit FFP infusing now will take another hour to infuse due to policy states per hr unless otherwise ordered by MD, but per [name] RN Assistant Director FFP should be infused at 258ml/hr due to lack of monitors. Riki Rusk staets ok.

## 2012-03-04 NOTE — Progress Notes (Signed)
[facility] PROGRESS NOTE  Vincent [name] ZOX:[id number] DOB: [birth year] DOA: 03/01/2012 PCP: Aura Dials, MD  Brief narrative: 78 year old male with previous history of rectal carcinoma (diagnosed in 2011) status post surgical resection and colostomy, radiation and chemotherapy, recurrent carcinoma with nodal metastases based on CT abdomen/pelvis findings. Patient is admitted for small bowel obstruction.   Assessment/Plan:   Principal Problem:  *Small bowel obstruction   Secondary to adhesions   No significant changes since yesterday  No output in ostomy bag  NG tube drainage 300 cc in past 12 hours   Based on x-ray of the abdomen done today there seems to be interval increase small bowel obstruction  Appreciate surgery following - per surgery recommendation we will try to reverse INR with 2 units of FFP for now and if INR normalized perhaps plan for surgery  Active Problem:  Anemia of chronic disease   Secondary to history of malignancy   No signs of active bleed  Hgb stable at 9.5 Atrial fibrillation   Rate controlled  Hyponatremia   Likely due to 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 recurrent metastatic rectal carcinoma   Slowly trending down, sodium today 132 Neutropenia  Likely due to malignancy  Stable at 3.5  Code Status: full code  Family Communication: Family not at bedside  Disposition Plan: home when stable   Consultants:  Surgery Procedures:  None  Antibiotics:  None   Manson Passey, MD  [facility]  Pager [phone]   If 7PM-7AM, please contact night-coverage [email or web address] Password [email or web address] 03/04/2012, 1:30 PM   LOS: 3 days   HPI/Subjective: No acute events overnight.  Objective: Filed Vitals:   03/04/12 0557 03/04/12 1115 03/04/12 1145 03/04/12 1244  BP: 114/75 93/53 95/52  124/54  Pulse: 76 93 73 76  Temp: 97.6 F (36.4 C) 97.7 F (36.5 C) 97.9 F (36.6 C) 98 F (36.7 C)  TempSrc: Oral Oral Oral Oral  Resp: 18 16 16 16   Height:        Weight:      SpO2: 96%       Intake/Output Summary (Last 24 hours) at 03/04/12 1330 Last data filed at 03/04/12 1236  Gross per 24 hour  Intake   3611 ml  Output   2250 ml  Net   1361 ml    Exam:   General:  Pt is alert, follows commands appropriately, not in acute distress  Cardiovascular: , S1/S2, no murmurs, no rubs, no gallops  Respiratory: Clear to auscultation bilaterally, no wheezing, no crackles, no rhonchi  Abdomen: distended but not tender; (+) colostomy, bowel sounds present, no guarding  Extremities: No edema, pulses DP and PT palpable bilaterally  Neuro: Grossly nonfocal  Data Reviewed: Basic Metabolic Panel:  Lab 03/04/12 8295 03/03/12 0429 03/02/12 0250 03/01/12 2000  NA 132* 130* 129* 131*  K 3.5 3.5 3.8 3.6  CL 96 94* 93* 93*  CO2 26 30 31 30   GLUCOSE 77 104* 129* 162*  BUN 16 10 14 15   CREATININE 0.99 0.92 0.96 0.99  CALCIUM 8.8 8.8 8.6 9.2   CBC:  Lab 03/04/12 0429 03/03/12 0429 03/02/12 0250 03/01/12 2000  WBC 3.5* 3.9* 3.5* 4.7  HGB 9.5* 9.7* 10.1* 10.4*  HCT 28.7* 28.5* 29.8* 30.4*  MCV 91.4 90.8 90.0 89.7  PLT 195 178 188 246    Studies: Dg Abd 2 Views 03/04/2012  *  IMPRESSION: Partial small bowel obstruction, likely mildly increased.  No free intraperitoneal air or other acute complication.   Original  Report Authenticated By: Jeronimo Greaves, M.D.    Dg Abd Portable 2v 03/03/2012  * IMPRESSION: Small bowel obstruction, grossly unchanged.  Enteric tube with its tip in the gastric cardia and side port in the distal esophagus.  Consider advancement.   Original Report Authenticated By: Charline Bills, M.D.     Scheduled Meds:  . antiseptic oral rinse  15 mL Mouth Rinse q12n4p  . chlorhexidine  15 mL Mouth Rinse BID   Continuous Infusions:  . dextrose 5 % and 0.45 % NaCl with KCl 20 mEq/L

## 2012-03-04 NOTE — Progress Notes (Signed)
PT Cancellation Note  Patient Details Name: Vincent [name] MRN: [id number] DOB: 1932-12-31   Cancelled Treatment:     Order received. Chart reviewed. Attempted PT evaluation x 2 today-pt not medically ready for PT evaluation today. Also possibly undergoing surgery later today. Will check back another day. Thanks.    Rebeca Alert [facility] 03/04/2012, 2:00 PM [phone]

## 2012-03-04 NOTE — Progress Notes (Signed)
Myself and [name], Assistant Director spoke to Dr. Biagio Quint, informed that first unit FFP almost finished infusing. MD states to page him after second unit infusing, and INR drawn, explained to MD that first unit not infused over 30 min due to lack of monitor on patient and that will have to run at atleast per hour because each unit is .

## 2012-03-05 ENCOUNTER — Encounter (HOSPITAL_COMMUNITY): Payer: Self-pay | Admitting: Certified Registered Nurse Anesthetist

## 2012-03-05 ENCOUNTER — Inpatient Hospital Stay (HOSPITAL_COMMUNITY): Payer: Medicare Other | Admitting: Certified Registered Nurse Anesthetist

## 2012-03-05 ENCOUNTER — Encounter (HOSPITAL_COMMUNITY)
Admission: EM | Disposition: A | Discharge status: Home Health | Payer: Self-pay | Source: Home / Self Care | Attending: Internal Medicine

## 2012-03-05 DIAGNOSIS — K565 Intestinal adhesions [bands], unspecified as to partial versus complete obstruction: Secondary | ICD-10-CM

## 2012-03-05 HISTORY — PX: LAPAROSCOPIC LYSIS OF ADHESIONS: SHX5905

## 2012-03-05 HISTORY — PX: LAPAROTOMY: SHX154

## 2012-03-05 LAB — PREPARE FRESH FROZEN PLASMA: Unit division: 0

## 2012-03-05 LAB — GLUCOSE, CAPILLARY
Glucose-Capillary: 212 mg/dL — ABNORMAL HIGH (ref 70–99)
Glucose-Capillary: 221 mg/dL — ABNORMAL HIGH (ref 70–99)

## 2012-03-05 LAB — BASIC METABOLIC PANEL
CO2: 28 mEq/L (ref 19–32)
Calcium: 8.7 mg/dL (ref 8.4–10.5)
Chloride: 96 mEq/L (ref 96–112)
Glucose, Bld: 208 mg/dL — ABNORMAL HIGH (ref 70–99)
Sodium: 131 mEq/L — ABNORMAL LOW (ref 135–145)

## 2012-03-05 LAB — CBC WITH DIFFERENTIAL/PLATELET
Eosinophils Absolute: 0 10*3/uL (ref 0.0–0.7)
Eosinophils Relative: 2 % (ref 0–5)
HCT: 27.4 % — ABNORMAL LOW (ref 39.0–52.0)
Lymphs Abs: 0.3 10*3/uL — ABNORMAL LOW (ref 0.7–4.0)
MCH: 30.6 pg (ref 26.0–34.0)
MCV: 90.1 fL (ref 78.0–100.0)
Monocytes Absolute: 0.3 10*3/uL (ref 0.1–1.0)
Monocytes Relative: 11 % (ref 3–12)
Platelets: 186 10*3/uL (ref 150–400)
RBC: 3.04 MIL/uL — ABNORMAL LOW (ref 4.22–5.81)

## 2012-03-05 LAB — TYPE AND SCREEN

## 2012-03-05 SURGERY — LAPAROTOMY, EXPLORATORY
Anesthesia: General | Site: Abdomen | Wound class: Contaminated

## 2012-03-05 MED ORDER — FENTANYL CITRATE 0.05 MG/ML IJ SOLN
INTRAMUSCULAR | Status: DC | PRN
  Administered 2012-03-05 (×2): 50 ug via INTRAVENOUS
  Administered 2012-03-05: 100 ug via INTRAVENOUS
  Administered 2012-03-05: 50 ug via INTRAVENOUS

## 2012-03-05 MED ORDER — PROMETHAZINE HCL 25 MG/ML IJ SOLN: 6.2500 mg | INTRAMUSCULAR | Status: DC | PRN

## 2012-03-05 MED ORDER — EPHEDRINE SULFATE 50 MG/ML IJ SOLN
INTRAMUSCULAR | Status: DC | PRN
  Administered 2012-03-05 (×2): 5 mg via INTRAVENOUS

## 2012-03-05 MED ORDER — ACETAMINOPHEN 10 MG/ML IV SOLN
INTRAVENOUS | Status: DC | PRN
  Administered 2012-03-05: 1000 mg via INTRAVENOUS

## 2012-03-05 MED ORDER — DEXTROSE 5 % IV SOLN
1.0000 g | Freq: Three times a day (TID) | INTRAVENOUS | Status: AC
  Administered 2012-03-05 – 2012-03-06 (×3): 1 g via INTRAVENOUS
  Filled 2012-03-05 (×5): qty 1

## 2012-03-05 MED ORDER — LIDOCAINE HCL (CARDIAC) 20 MG/ML IV SOLN
INTRAVENOUS | Status: DC | PRN
  Administered 2012-03-05: 50 mg via INTRAVENOUS

## 2012-03-05 MED ORDER — KCL IN DEXTROSE-NACL 20-5-0.45 MEQ/L-%-% IV SOLN
INTRAVENOUS | Status: DC
  Administered 2012-03-05 – 2012-03-06 (×2): via INTRAVENOUS
  Filled 2012-03-05 (×3): qty 1000

## 2012-03-05 MED ORDER — DIPHENHYDRAMINE HCL 12.5 MG/5ML PO ELIX: 12.5000 mg | ORAL_SOLUTION | Freq: Four times a day (QID) | ORAL | Status: DC | PRN

## 2012-03-05 MED ORDER — LACTATED RINGERS IV SOLN
INTRAVENOUS | Status: DC | PRN
  Administered 2012-03-05 (×4): via INTRAVENOUS

## 2012-03-05 MED ORDER — NALOXONE HCL 0.4 MG/ML IJ SOLN: 0.4000 mg | INTRAMUSCULAR | Status: DC | PRN

## 2012-03-05 MED ORDER — SODIUM CHLORIDE 0.9 % IV SOLN
INTRAVENOUS | Status: DC | PRN
  Administered 2012-03-05: 11:00:00 via INTRAVENOUS

## 2012-03-05 MED ORDER — DIPHENHYDRAMINE HCL 50 MG/ML IJ SOLN: 12.5000 mg | Freq: Four times a day (QID) | INTRAMUSCULAR | Status: DC | PRN

## 2012-03-05 MED ORDER — SUCCINYLCHOLINE CHLORIDE 20 MG/ML IJ SOLN
INTRAMUSCULAR | Status: DC | PRN
  Administered 2012-03-05: 120 mg via INTRAVENOUS

## 2012-03-05 MED ORDER — 0.9 % SODIUM CHLORIDE (POUR BTL) OPTIME
TOPICAL | Status: DC | PRN
  Administered 2012-03-05 (×4): 1000 mL

## 2012-03-05 MED ORDER — SODIUM CHLORIDE 0.9 % IJ SOLN: 9.0000 mL | INTRAMUSCULAR | Status: DC | PRN

## 2012-03-05 MED ORDER — ETOMIDATE 2 MG/ML IV SOLN
INTRAVENOUS | Status: DC | PRN
  Administered 2012-03-05: 15 mg via INTRAVENOUS

## 2012-03-05 MED ORDER — DEXAMETHASONE SODIUM PHOSPHATE 10 MG/ML IJ SOLN
INTRAMUSCULAR | Status: DC | PRN
  Administered 2012-03-05: 10 mg via INTRAVENOUS

## 2012-03-05 MED ORDER — HYDROMORPHONE HCL PF 1 MG/ML IJ SOLN
0.2500 mg | INTRAMUSCULAR | Status: DC | PRN
  Administered 2012-03-05 (×2): 0.5 mg via INTRAVENOUS

## 2012-03-05 MED ORDER — ONDANSETRON HCL 4 MG/2ML IJ SOLN: 4.0000 mg | Freq: Four times a day (QID) | INTRAMUSCULAR | Status: DC | PRN

## 2012-03-05 MED ORDER — MORPHINE SULFATE (PF) 1 MG/ML IV SOLN
INTRAVENOUS | Status: DC
  Administered 2012-03-05 (×2): via INTRAVENOUS
  Administered 2012-03-05: 4.5 mg via INTRAVENOUS
  Administered 2012-03-05: 9 mg via INTRAVENOUS
  Administered 2012-03-06: 10.5 mg via INTRAVENOUS
  Administered 2012-03-06: 6 mg via INTRAVENOUS
  Administered 2012-03-06: 10:00:00 via INTRAVENOUS
  Administered 2012-03-06: 3 mg via INTRAVENOUS
  Administered 2012-03-06: 12 mg via INTRAVENOUS
  Administered 2012-03-07: 1.5 mg via INTRAVENOUS
  Administered 2012-03-07: 7 mg via INTRAVENOUS
  Administered 2012-03-07: 1.5 mg via INTRAVENOUS
  Administered 2012-03-07: 2 mg via INTRAVENOUS
  Administered 2012-03-07: 6 mg via INTRAVENOUS
  Filled 2012-03-05 (×2): qty 25

## 2012-03-05 MED ORDER — PHENYLEPHRINE HCL 10 MG/ML IJ SOLN
INTRAMUSCULAR | Status: DC | PRN
  Administered 2012-03-05: 40 ug via INTRAVENOUS

## 2012-03-05 MED ORDER — GLYCOPYRROLATE 0.2 MG/ML IJ SOLN
INTRAMUSCULAR | Status: DC | PRN
  Administered 2012-03-05: .2 mg via INTRAVENOUS

## 2012-03-05 MED ORDER — NEOSTIGMINE METHYLSULFATE 1 MG/ML IJ SOLN
INTRAMUSCULAR | Status: DC | PRN
  Administered 2012-03-05: 1 mg via INTRAVENOUS

## 2012-03-05 MED ORDER — ONDANSETRON HCL 4 MG/2ML IJ SOLN
INTRAMUSCULAR | Status: DC | PRN
  Administered 2012-03-05 (×2): 2 mg via INTRAVENOUS

## 2012-03-05 MED ORDER — INSULIN ASPART 100 UNIT/ML ~~LOC~~ SOLN
0.0000 [IU] | Freq: Four times a day (QID) | SUBCUTANEOUS | Status: DC
  Administered 2012-03-05 (×2): 5 [IU] via SUBCUTANEOUS
  Administered 2012-03-06 (×2): 3 [IU] via SUBCUTANEOUS
  Administered 2012-03-08 – 2012-03-10 (×4): 2 [IU] via SUBCUTANEOUS
  Administered 2012-03-10: 3 [IU] via SUBCUTANEOUS
  Administered 2012-03-11: 2 [IU] via SUBCUTANEOUS

## 2012-03-05 MED ORDER — LACTATED RINGERS IR SOLN
Status: DC | PRN
  Administered 2012-03-05: 3000 mL

## 2012-03-05 MED ORDER — CISATRACURIUM BESYLATE (PF) 10 MG/5ML IV SOLN
INTRAVENOUS | Status: DC | PRN
  Administered 2012-03-05 (×2): 2 mg via INTRAVENOUS
  Administered 2012-03-05: 4 mg via INTRAVENOUS
  Administered 2012-03-05: 2 mg via INTRAVENOUS

## 2012-03-05 MED ORDER — KCL IN DEXTROSE-NACL 20-5-0.45 MEQ/L-%-% IV SOLN
INTRAVENOUS | Status: DC | PRN
  Administered 2012-03-05: 10:00:00 via INTRAVENOUS

## 2012-03-05 MED ORDER — DEXTROSE 5 % IV SOLN
1.0000 g | INTRAVENOUS | Status: DC | PRN
  Administered 2012-03-05: 1 g via INTRAVENOUS

## 2012-03-05 MED ORDER — ALBUTEROL SULFATE HFA 108 (90 BASE) MCG/ACT IN AERS
INHALATION_SPRAY | RESPIRATORY_TRACT | Status: DC | PRN
  Administered 2012-03-05: 4 via RESPIRATORY_TRACT

## 2012-03-05 SURGICAL SUPPLY — 43 items
APPLICATOR COTTON TIP 6IN STRL (MISCELLANEOUS) ×5 IMPLANT
BLADE EXTENDED COATED 6.5IN (ELECTRODE) ×1 IMPLANT
BLADE HEX COATED 2.75 (ELECTRODE) ×2 IMPLANT
CANISTER SUCTION 2500CC (MISCELLANEOUS) ×4 IMPLANT
CHLORAPREP W/TINT 26ML (MISCELLANEOUS) ×2 IMPLANT
CLOTH BEACON ORANGE TIMEOUT ST (SAFETY) ×2 IMPLANT
COVER MAYO STAND STRL (DRAPES) ×1 IMPLANT
DRAPE LAPAROSCOPIC ABDOMINAL (DRAPES) ×2 IMPLANT
DRAPE WARM FLUID 44X44 (DRAPE) ×1 IMPLANT
DRSG PAD ABDOMINAL 8X10 ST (GAUZE/BANDAGES/DRESSINGS) ×1 IMPLANT
ELECT REM PT RETURN 9FT ADLT (ELECTROSURGICAL) ×2
ELECTRODE REM PT RTRN 9FT ADLT (ELECTROSURGICAL) ×1 IMPLANT
GLOVE BIO SURGEON STRL SZ7 (GLOVE) ×2 IMPLANT
GLOVE BIOGEL PI IND STRL 7.0 (GLOVE) ×1 IMPLANT
GLOVE BIOGEL PI INDICATOR 7.0 (GLOVE) ×1
GLOVE SURG SS PI 7.5 STRL IVOR (GLOVE) ×4 IMPLANT
GOWN PREVENTION PLUS LG XLONG (DISPOSABLE) ×2 IMPLANT
GOWN STRL NON-REIN LRG LVL3 (GOWN DISPOSABLE) ×2 IMPLANT
GOWN STRL REIN XL XLG (GOWN DISPOSABLE) ×14 IMPLANT
KIT BASIN OR (CUSTOM PROCEDURE TRAY) ×2 IMPLANT
LIGASURE IMPACT 36 18CM CVD LR (INSTRUMENTS) IMPLANT
NS IRRIG 1000ML POUR BTL (IV SOLUTION) ×7 IMPLANT
PACK GENERAL/GYN (CUSTOM PROCEDURE TRAY) ×2 IMPLANT
SCALPEL HARMONIC ACE (MISCELLANEOUS) ×1 IMPLANT
SHEARS FOC LG CVD HARMONIC 17C (MISCELLANEOUS) IMPLANT
SPONGE GAUZE 4X4 12PLY (GAUZE/BANDAGES/DRESSINGS) ×2 IMPLANT
SPONGE LAP 18X18 X RAY DECT (DISPOSABLE) ×1 IMPLANT
STAPLER VISISTAT 35W (STAPLE) ×2 IMPLANT
SUCTION POOLE TIP (SUCTIONS) ×1 IMPLANT
SUT NOVA T20/GS 25 (SUTURE) ×3 IMPLANT
SUT PDS AB 1 CTX 36 (SUTURE) ×2 IMPLANT
SUT PDS AB 1 TP1 96 (SUTURE) IMPLANT
SUT SILK 2 0 (SUTURE) ×2
SUT SILK 2 0 SH CR/8 (SUTURE) ×1 IMPLANT
SUT SILK 2-0 18XBRD TIE 12 (SUTURE) ×1 IMPLANT
SUT SILK 3 0 (SUTURE) ×2
SUT SILK 3 0 SH CR/8 (SUTURE) ×1 IMPLANT
SUT SILK 3-0 18XBRD TIE 12 (SUTURE) IMPLANT
SUT VIC AB 3-0 SH 18 (SUTURE) ×2 IMPLANT
TOWEL OR 17X26 10 PK STRL BLUE (TOWEL DISPOSABLE) ×5 IMPLANT
TRAY FOLEY CATH 14FRSI W/METER (CATHETERS) ×1 IMPLANT
TROCAR BLADELESS OPT 5 75 (ENDOMECHANICALS) ×6 IMPLANT
YANKAUER SUCT BULB TIP NO VENT (SUCTIONS) ×1 IMPLANT

## 2012-03-05 NOTE — Progress Notes (Signed)
[facility] PROGRESS NOTE  Vincent [name] UXL:[id number] DOB: [birth year] DOA: 03/01/2012 PCP: Aura Dials, MD  Brief narrative: 78 year old male with previous history of rectal carcinoma (diagnosed in 2011) status post surgical resection and colostomy, radiation and chemotherapy, recurrent carcinoma with nodal metastases based on CT abdomen/pelvis findings. Patient is admitted for small bowel obstruction, treated conservatively for past 5 days with no significant clinical changes. Plan for surgery today.  Assessment/Plan:   Principal Problem:  *Small bowel obstruction  Secondary to adhesions versus malignant spread Has NG tube for decompression Plan for surgery today Based on x-ray of the abdomen 11/1/2013interval increase in small bowel obstruction  Appreciate surgery following  Patient has received 2 units of FFP to reverse INR; INR this am 1.66  Active Problem:  Anemia of chronic disease  Secondary to history of malignancy  No signs of active bleed  Hgb stable at 9.3 Atrial fibrillation  Rate controlled  Hyponatremia  Likely due to 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 recurrent metastatic rectal carcinoma  Neutropenia  Secondary to malignancy   Code Status: full code  Family Communication: Daughter updated at bedside  Disposition Plan: home when stable   Consultants:  Surgery Procedures:  Surgery planned today Antibiotics:  None   Manson Passey, MD  [facility]  Pager [phone]   If 7PM-7AM, please contact night-coverage [email or web address] Password [email or web address] 03/05/2012, 8:25 AM   LOS: 4 days   HPI/Subjective: No acute events overnight.  Objective: Filed Vitals:   03/04/12 1407 03/04/12 1447 03/04/12 2140 03/05/12 0556  BP: 135/49 141/53 144/70 145/71  Pulse: 75 69 65 63  Temp: 97.9 F (36.6 C) 97.7 F (36.5 C) 97.9 F (36.6 C) 98.2 F (36.8 C)  TempSrc: Oral Oral Oral Oral  Resp: 16 16 16 14   Height:      Weight:      SpO2:   100% 100%    Intake/Output Summary  (Last 24 hours) at 03/05/12 0825 Last data filed at 03/05/12 0600  Gross per 24 hour  Intake 3372.75 ml  Output   1650 ml  Net 1722.75 ml    Exam:   General:  Pt is alert, follows commands appropriately, not in acute distress  Cardiovascular: Regular rate and rhythm, S1/S2, no murmurs, no rubs, no gallops  Respiratory: Clear to auscultation bilaterally, no wheezing, no crackles, no rhonchi  Abdomen: Soft, non tender, non distended, no ostomy output; bowel sounds present, no guarding  Extremities: LE edema, pulses DP and PT palpable bilaterally  Neuro: Grossly nonfocal  Data Reviewed: Basic Metabolic Panel:  Lab 03/05/12 3474 03/04/12 0429 03/03/12 0429 03/02/12 0250 03/01/12 2000  NA 131* 132* 130* 129* 131*  K 3.5 3.5 3.5 3.8 3.6  CL 96 96 94* 93* 93*  CO2 28 26 30 31 30   GLUCOSE 208* 77 104* 129* 162*  BUN 15 16 10 14 15   CREATININE 0.91 0.99 0.92 0.96 0.99  CALCIUM 8.7 8.8 8.8 8.6 9.2   CBC:  Lab 03/05/12 0508 03/04/12 0429 03/03/12 0429 03/02/12 0250 03/01/12 2000  WBC 2.6* 3.5* 3.9* 3.5* 4.7  HGB 9.3* 9.5* 9.7* 10.1* 10.4*  HCT 27.4* 28.7* 28.5* 29.8* 30.4*  MCV 90.1 91.4 90.8 90.0 89.7  PLT 186 195 178 188 246    SURGICAL PCR SCREEN     Status: Normal   Collection Time   03/04/12  1:31 PM      Component Value Range Status Comment   MRSA, PCR NEGATIVE  NEGATIVE Final    Staphylococcus  aureus NEGATIVE  NEGATIVE Final      Studies: Dg Abd 2 Views 03/04/2012  * IMPRESSION: Partial small bowel obstruction, likely mildly increased.  No free intraperitoneal air or other acute complication.   Original Report Authenticated By: Jeronimo Greaves, M.D.     Scheduled Meds:   . antiseptic oral rinse  15 mL Mouth Rinse q12n4p  . chlorhexidine  15 mL Mouth Rinse BID   Continuous Infusions:   . dextrose 5 % and 0.45 % NaCl with KCl 20 mEq/L 125 mL/hr at 03/05/12 [phone]

## 2012-03-05 NOTE — Anesthesia Postprocedure Evaluation (Signed)
  Anesthesia Post-op Note  Patient: Vincent [name]  Procedure(s) Performed: Procedure(s) (LRB): EXPLORATORY LAPAROTOMY (N/A) LAPAROSCOPIC LYSIS OF ADHESIONS (N/A)  Patient Location: PACU  Anesthesia Type: General  Level of Consciousness: awake and alert   Airway and Oxygen Therapy: Patient Spontanous Breathing  Post-op Pain: mild  Post-op Assessment: Post-op Vital signs reviewed, Patient's Cardiovascular Status Stable, Respiratory Function Stable, Patent Airway and No signs of Nausea or vomiting  Post-op Vital Signs: stable  Complications: No apparent anesthesia complications

## 2012-03-05 NOTE — Progress Notes (Signed)
PT Cancellation Note  Patient Details Name: Vincent [name] MRN: [id number] DOB: 15-Feb-1933   Cancelled Treatment:    Reason Eval/Treat Not Completed: Medical issues which prohibited therapy (pt having surgery today). Will follow.   Ralene Bathe Kistler 03/05/2012, 10:50 AM [phone]

## 2012-03-05 NOTE — Transfer of Care (Signed)
Immediate Anesthesia Transfer of Care Note  Patient: Vincent GARRELTS  Procedure(s) Performed: Procedure(s) (LRB) with comments: EXPLORATORY LAPAROTOMY (N/A) LAPAROSCOPIC LYSIS OF ADHESIONS (N/A)  Patient Location: PACU  Anesthesia Type:General  Level of Consciousness: awake, alert , sedated and patient cooperative  Airway & Oxygen Therapy: Patient Spontanous Breathing and Patient connected to face mask oxygen  Post-op Assessment: Report given to PACU RN, Post -op Vital signs reviewed and stable and Patient moving all extremities  Post vital signs: Reviewed and stable  Complications: No apparent anesthesia complications

## 2012-03-05 NOTE — Anesthesia Preprocedure Evaluation (Addendum)
Anesthesia Evaluation  Patient identified by MRN, date of birth, ID band Patient awake  General Assessment Comment:Colon ca with hepatic mets  Reviewed: Allergy & Precautions, H&P , NPO status , Patient's Chart, lab work & pertinent test results  Airway Mallampati: II TM Distance: <3 FB Neck ROM: Full    Dental No notable dental hx. (+) Dental Advisory Given   Pulmonary former smoker,  breath sounds clear to auscultation  Pulmonary exam normal       Cardiovascular hypertension, Pt. on medications + Peripheral Vascular Disease + dysrhythmias Atrial Fibrillation Rhythm:Regular Rate:Tachycardia     Neuro/Psych negative neurological ROS  negative psych ROS   GI/Hepatic negative GI ROS, Neg liver ROS,   Endo/Other  diabetes  Renal/GU negative Renal ROS  negative genitourinary   Musculoskeletal negative musculoskeletal ROS (+)   Abdominal   Peds negative pediatric ROS (+)  Hematology  (+) Blood dyscrasia, anemia ,   Anesthesia Other Findings   Reproductive/Obstetrics negative OB ROS                           Anesthesia Physical Anesthesia Plan  ASA: IV  Anesthesia Plan: General   Post-op Pain Management:    Induction: Intravenous, Rapid sequence and Cricoid pressure planned  Airway Management Planned: Oral ETT  Additional Equipment:   Intra-op Plan:   Post-operative Plan: Possible Post-op intubation/ventilation  Informed Consent: I have reviewed the patients History and Physical, chart, labs and discussed the procedure including the risks, benefits and alternatives for the proposed anesthesia with the patient or authorized representative who has indicated his/her understanding and acceptance.   Dental advisory given  Plan Discussed with: CRNA and Surgeon  Anesthesia Plan Comments:        Anesthesia Quick Evaluation

## 2012-03-05 NOTE — Preoperative (Signed)
Beta Blockers   Reason not to administer Beta Blockers:Not Applicable 

## 2012-03-05 NOTE — Progress Notes (Signed)
Pre op note:  His daughter, Durward Mallard, is in the room (Cell: [phone]).  The grandson is also in the room Wife, Kathie Rhodes, has Alzheimer's and is primary health care power of atty. Son, Nestor Marano, is secondary health care power of atty.  I spoke to him by phone.  Dr. Biagio Quint has spoken to him with similar risks explained.  I talked to Alden Benjamin, his sister, who lives in [place], who seems to have the documents for the patient.  Patient's PCP - Gailen Shelter Cardiologist -Nanetta Batty Oncologist - Ayesha Mohair (the daughter has spoken to Dr. Clelia Croft since this admission)  Patient had primary resection and end colostomy by Dr. Tobe Sos 02/13/2010.  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  Then had rad tx with xeloda, followed by FOLFOX x 7 cycles.  Therapy concluded May 2012.  He is now on FOLFIRI for salvage purposes - I think he has had two courses. Last CEA - 5.7 - 01/05/2012.  On coumadin for A. Fib.  PE: BP 145/71  Pulse 63  Temp 98.2 F (36.8 C) (Oral)  Resp 14  Ht 5\' 11"  (1.803 m)  Wt 188 lb 7.9 oz (85.5 kg)  BMI 26.29 kg/m2  SpO2 100%  Abdomen:  Somewhat tight.  BS present.  LLQ colostomy with no output.  Assessment and Plan: Small bowel obstruction - with loops of small bowel in the left abdomen.  The patient was admitted 10/28, so he has been treated medically for 5 days without improving.  I think it is reasonable to explore him and see if the problem is scar tissue vs metastatic cancer.  I expect the risk of finding advanced cancer is about 50% and I have explained this to the patient and his wife.

## 2012-03-06 LAB — PREPARE FRESH FROZEN PLASMA: Unit division: 0

## 2012-03-06 LAB — BASIC METABOLIC PANEL
CO2: 27 mEq/L (ref 19–32)
Calcium: 8.3 mg/dL — ABNORMAL LOW (ref 8.4–10.5)
Chloride: 92 mEq/L — ABNORMAL LOW (ref 96–112)
GFR calc non Af Amer: 85 mL/min — ABNORMAL LOW (ref >90)

## 2012-03-06 LAB — CBC WITH DIFFERENTIAL/PLATELET
Basophils Absolute: 0 10*3/uL (ref 0.0–0.1)
Eosinophils Absolute: 0 10*3/uL (ref 0.0–0.7)
HCT: 29.2 % — ABNORMAL LOW (ref 39.0–52.0)
Lymphocytes Relative: 3 % — ABNORMAL LOW (ref 12–46)
MCHC: 34.6 g/dL (ref 30.0–36.0)
Neutro Abs: 6.4 10*3/uL (ref 1.7–7.7)
RDW: 16.2 % — ABNORMAL HIGH (ref 11.5–15.5)

## 2012-03-06 LAB — GLUCOSE, CAPILLARY: Glucose-Capillary: 172 mg/dL — ABNORMAL HIGH (ref 70–99)

## 2012-03-06 MED ORDER — DEXTROSE 5 % IV SOLN
5.0000 mg/h | INTRAVENOUS | Status: DC
  Administered 2012-03-06: 5 mg/h via INTRAVENOUS
  Administered 2012-03-06 (×2): 10 mg/h via INTRAVENOUS
  Administered 2012-03-07: 5 mg/h via INTRAVENOUS

## 2012-03-06 MED ORDER — DILTIAZEM HCL 25 MG/5ML IV SOLN
5.0000 mg | INTRAVENOUS | Status: DC | PRN
  Administered 2012-03-06 (×2): 5 mg via INTRAVENOUS
  Filled 2012-03-06: qty 5

## 2012-03-06 MED ORDER — DILTIAZEM HCL 25 MG/5ML IV SOLN
5.0000 mg | Freq: Once | INTRAVENOUS | Status: AC
  Administered 2012-03-06: 5 mg via INTRAVENOUS
  Filled 2012-03-06: qty 5

## 2012-03-06 MED ORDER — SODIUM CHLORIDE 0.9 % IV SOLN
INTRAVENOUS | Status: DC
  Administered 2012-03-06 – 2012-03-10 (×4): via INTRAVENOUS
  Administered 2012-03-10: 125 mL/h via INTRAVENOUS

## 2012-03-06 MED ORDER — LORAZEPAM 2 MG/ML IJ SOLN
1.0000 mg | Freq: Four times a day (QID) | INTRAMUSCULAR | Status: DC | PRN
  Administered 2012-03-06 – 2012-03-07 (×2): 1 mg via INTRAVENOUS
  Filled 2012-03-06 (×2): qty 1

## 2012-03-06 MED ORDER — LORAZEPAM 2 MG/ML IJ SOLN
INTRAMUSCULAR | Status: AC
  Filled 2012-03-06: qty 1

## 2012-03-06 MED ORDER — HEPARIN SODIUM (PORCINE) 5000 UNIT/ML IJ SOLN
5000.0000 [IU] | Freq: Three times a day (TID) | INTRAMUSCULAR | Status: DC
  Administered 2012-03-06 – 2012-03-07 (×4): 5000 [IU] via SUBCUTANEOUS
  Filled 2012-03-06 (×7): qty 1

## 2012-03-06 MED ORDER — VITAMINS A & D EX OINT
TOPICAL_OINTMENT | CUTANEOUS | Status: AC
  Administered 2012-03-06: 1
  Filled 2012-03-06: qty 5

## 2012-03-06 MED FILL — Bupivacaine Inj 0.5% w/ Epinephrine 1:200000 (PF): INTRAMUSCULAR | Qty: 30 | Status: AC

## 2012-03-06 NOTE — Progress Notes (Signed)
General Surgery Note  LOS: 5 days  POD -  1 Day Post-Op Room - 1226  Assessment/Plan: 1.  EXPLORATORY LAPAROTOMY, LAPAROSCOPIC LYSIS OF ADHESIONS - D. Vincent [name] - 03/05/2012  For SBO (small bowel obstruction) secondary to adhesive band - no obvious intra-peritoneal cancer.  Ileus - cont NGT  Will leave in step down due to age, severity of illness, and early post op  2.  Rectal carcinoma - Dr. Earl Gala oncologist  Primary resection by Dr. Nat Christen - 02/13/2010  Has pelvic recurrence and has been on FOLFIRI   3.  PAF (paroxysmal atrial fibrillation)   Has seen Dr. Erlene Quan  Was on coumadin - held for now 4.  HTN (hypertension)  5.  AAA (abdominal aortic aneurysm) without rupture  6.  DVT proph - SQ hep 7.  Foley - will leave in at least until tomorrow  Subjective:  Sore.  But otherwise okay. Objective:   Filed Vitals:   03/06/12 0600  BP: 116/48  Pulse: 94  Temp:   Resp: 19     Intake/Output from previous day:  11/02 0701 - 11/03 0700 In: 5385.3 [I.V.:4535.3; Blood:660; NG/GT:90; IV Piggyback:100] Out: 2295 [Urine:1425; Emesis/NG output:470]  Intake/Output this shift:  Total I/O In: 1487.5 [I.V.:1397.5; NG/GT:90] Out: 825 [Urine:525; Emesis/NG output:300]   Physical Exam:   General: WN older WM who is alert and oriented.    HEENT: Normal. Pupils equal. .   Lungs: Clear   Abdomen: Quiet - ostomy with no output   Wound: covered with dressing   Neurologic:  Grossly intact to motor and sensory function.   Psychiatric: Has normal mood and affect.   Lab Results:    [facility] 03/06/12 0320 03/05/12 0508  WBC 7.0 2.6*  HGB 10.1* 9.3*  HCT 29.2* 27.4*  PLT 213 186    BMET   Basename 03/06/12 0320 03/05/12 0508  NA 127* 131*  K 4.5 3.5  CL 92* 96  CO2 27 28  GLUCOSE 213* 208*  BUN 11 15  CREATININE 0.80 0.91  CALCIUM 8.3* 8.7    PT/INR   Basename 03/05/12 0847 03/04/12 1454  LABPROT 19.1* 18.2*  INR 1.66* 1.56*    ABG  No results found for this  basename: PHART:2,PCO2:2,PO2:2,HCO3:2 in the last 72 hours   Studies/Results:  Dg Abd 2 Views  03/04/2012  *RADIOLOGY REPORT*  Clinical Data: Possible obstruction. Adhesions.  No ostomy output.  ABDOMEN - 2 VIEW  Comparison: 03/03/2012  Findings: Upright and supine views.  The upright view images the lower chest and upper abdomen.  This demonstrates a left-sided Port- A-Cath which terminates at the low SVC.  Nasogastric tube which terminates at the distal stomach.  Air-fluid levels within small bowel loops of the left and mid abdomen. No free intraperitoneal air.  The supine view excludes the low pelvis.  This demonstrates persistent small bowel dilatation, with an index node measuring 4.6 cm today versus 3.6 cm on the prior.  There is gas and stool within the ascending colon, but relative paucity of distal gas.  No pneumatosis. No abnormal abdominal calcifications.   No appendicolith.  IMPRESSION: Partial small bowel obstruction, likely mildly increased.  No free intraperitoneal air or other acute complication.   Original Report Authenticated By: Jeronimo Greaves, M.D.      Anti-infectives:   Anti-infectives     Start     Dose/Rate Route Frequency Ordered Stop   03/05/12 1800   cefOXitin (MEFOXIN) 1 g in dextrose 5 % 50 mL  IVPB        1 g 100 mL/hr over 30 Minutes Intravenous 3 times per day 03/05/12 1318 03/06/12 2159          Ovidio Kin, MD, FACS Pager: [phone],   [facility] Surgery Office: [phone] 03/06/2012

## 2012-03-06 NOTE — Progress Notes (Signed)
[facility] PROGRESS NOTE  Vincent [name] ZOX:[id number] DOB: [birth year] DOA: 03/01/2012 PCP: Aura Dials, MD  Brief narrative: 78 year old male with previous history of rectal carcinoma (diagnosed in 2011) status post surgical resection and colostomy, radiation and chemotherapy, recurrent carcinoma with nodal metastases based on CT abdomen/pelvis findings. Patient is admitted for small bowel obstruction, treated conservatively for past 5 days with no significant clinical changes. Patient is status post exploratory laparotomy, POD #1, on PCA morphine.  Assessment/Plan:   Principal Problem:  *Small bowel obstruction  Status post exploratory laparotomy POD #1 Secondary to adhesions; per surgery no intraperitoneal cancer Continue NG tube for decompression   Appreciate surgery following  Of note, patient has received 2 units of FFP to reverse INR prior to surgery  Active Problem:  Anemia of chronic disease  Secondary to history of malignancy  No signs of active bleed  Hgb stable at 10.1 Atrial fibrillation  Rate controlled  Hyponatremia  Likely due to 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 recurrent metastatic rectal carcinoma  Change IV fluids to NS Follow up BMP in am Neutropenia  Secondary to malignancy  Resolved  Code Status: DNR Family Communication: family not at bedside Disposition Plan: home when stable; PT evaluation when patient stable   Consultants:  Surgery Procedures:  Exploratory laparotomy 03/05/2012 Antibiotics:  Cefoxin 03/05/2012 -->  Manson Passey, MD  [facility]  Pager [phone]   If 7PM-7AM, please contact night-coverage [email or web address] Password [email or web address] 03/06/2012, 11:31 AM   LOS: 5 days   HPI/Subjective: Patient is sleepy but does say he has abdominal pain even with PCA morphine.  Objective: Filed Vitals:   03/06/12 0600 03/06/12 0700 03/06/12 0800 03/06/12 1025  BP: 116/48 117/50 125/58   Pulse: 94 95 93   Temp:   97.8 F (36.6 C)   TempSrc:   Oral     Resp: 19 22 20 16   Height:      Weight:      SpO2: 94% 97% 97% 96%    Intake/Output Summary (Last 24 hours) at 03/06/12 1131 Last data filed at 03/06/12 0800  Gross per 24 hour  Intake 3616.33 ml  Output   1570 ml  Net 2046.33 ml    Exam:   General:  Pt is sleeping, no acute distress  Cardiovascular: irregular rhythm rate controlled, (+) S1,S2  Respiratory: Clear to auscultation bilaterally, no wheezing  Abdomen: tender across mid abdomen, no guarding, hard to palpation; (+) colostomy with no stigmata of infection  Extremities: LE edema, pulses DP and PT palpable bilaterally  Neuro: Grossly nonfocal  Data Reviewed: Basic Metabolic Panel:  Lab 03/06/12 8295 03/05/12 0508 03/04/12 0429 03/03/12 0429 03/02/12 0250  NA 127* 131* 132* 130* 129*  K 4.5 3.5 3.5 3.5 3.8  CL 92* 96 96 94* 93*  CO2 27 28 26 30 31   GLUCOSE 213* 208* 77 104* 129*  BUN 11 15 16 10 14   CREATININE 0.80 0.91 0.99 0.92 0.96  CALCIUM 8.3* 8.7 8.8 8.8 8.6   CBC:  Lab 03/06/12 0320 03/05/12 0508 03/04/12 0429 03/03/12 0429 03/02/12 0250  WBC 7.0 2.6* 3.5* 3.9* 3.5*  HGB 10.1* 9.3* 9.5* 9.7* 10.1*  HCT 29.2* 27.4* 28.7* 28.5* 29.8*  MCV 89.3 90.1 91.4 90.8 90.0  PLT 213 186 195 178 188   CBG:  Lab 03/05/12 2345 03/05/12 1753 03/05/12 1320  GLUCAP 212* 225* 221*    SURGICAL PCR SCREEN     Status: Normal   Collection Time   03/04/12  1:31  PM      Component Value Range Status Comment   MRSA, PCR NEGATIVE  NEGATIVE Final    Staphylococcus aureus NEGATIVE  NEGATIVE Final      Studies: No results found.  Scheduled Meds:  . cefOXitin  1 g Intravenous Q8H  . heparin subcutaneous  5,000 Units Subcutaneous Q8H  . insulin aspart  0-15 Units Subcutaneous Q6H  . morphine   Intravenous Q4H

## 2012-03-06 NOTE — Op Note (Signed)
NAMEHUTCHINSON, Vincent NO.:  [id number]  MEDICAL RECORD NO.:  [id number]  LOCATION:  1226                         FACILITY:  [facility]  PHYSICIAN:  Sandria Bales. Ezzard Standing, M.D.  DATE OF BIRTH:  05-17-1932  DATE OF PROCEDURE:  03/05/2012                              OPERATIVE REPORT   PREOPERATIVE DIAGNOSIS:  Small bowel obstruction.  POSTOPERATIVE DIAGNOSIS:  Closed loop bowel obstruction secondary to adhesive band at base of left colon going to ostomy.  Benign (non obstructive) appearing adhesions in pelvis.  No obvious intra-peritoneal cancer.  Peri-stomal hernia, not involved in the obstruction.  OPERATION PERFORMED:  Laparoscopy converted to open enterolysis of adhesions with oversew of enterotomy.  SURGEON:  Sandria Bales. Ezzard Standing, M.D.  FIRST ASSISTANT:  Vincent [name]. Daphine Deutscher, MD  ANESTHESIA:  General endotracheal.  ESTIMATED BLOOD LOSS:  Minimal.  INDICATION FOR PROCEDURE:  Vincent [name] is a 78 year old white male who was diagnosed with a colon/rectal cancer in 2011.  He has undergone a colon resection with end colostomy by Dr. Gerrit Friends in October 2011.  He went through treatment with FOLFOX, but then has had evidence of recurrence.  He was started on FOLFIRI by Dr. Clelia Croft. His last treatment about 12 days ago. He developed a bowel obstruction, for which, he was admitted 5 days ago.  There was an attempt to treat this with medical treatment, but he continues to have persistence of pain and evidence of a small bowel obstruction and now comes to the operating room for exploration.  The indications and risks have been discussed with the patient and his family.  The biggest risk is that we do not know if the obstruction is benign or malignant.  He has been on Coumadin because of atrial fibrillation.  His last INR was approximately 19 with a INR of about 1.6.  He was given 2 units of FFP at the initiation of procedure.  OPERATIVE NOTE:  The patient was placed in supine position in room  #11. He had both his arms tucked.  He had general anesthesia supervised by Dr. Eilene Ghazi.  His abdomen was prepped with ChloraPrep and sterilely draped.  His colostomy was isolated with a Betadine sponge.  A time-out was held and the surgical checklist run.  I accessed the abdominal cavity through the right upper quadrant with a 5-mm Optiview trocar.  I placed four additional Optiview 5-mm, one in the LUQ and three on the right side: one in the right lower quadrant, one in the right mid-abdomen and near the midline. I first did enterolysis of the omentum against his anterior abdominal wall, and took the omentum down. I was able to find small bowel and tract that back from the terminal ileum.  However, he had a mass of small bowel in the pelvis where his prior surgery was.  There was no gross tumor in the pelvis, but there were multiple non obstructed loops trapped in the pelvis.  Non of these small bowel loops were dilated.    What was dilated was a segment of small bowel trapped at the base of the left colon going to the ostomy.  He did have a peri-stomal hernia,  which was about 3-4 cm in diameter with omentum in it.  I took the omentum out of this.  This was otherwise patulous.    I had to work under the dilated small bowel to find the adhesion.  While exposing the adhesive band,   I made an incidental enterotomy in the small bowel with the Harmonic scapel.  At this point, I opened the abdomen. I found the adhesive band and divided it.  The trapped loop was about mid small bowel that involved about 18-20 inches of small bowel in this closed loop.  I oversewed the enterotomy with interrupted silk sutures x3.  He had one serosal tear of the small bowel that I closed with a 2-0 Vicryl suture.  Otherwise, the bowel looked reasonably healthy.  To help get it decompressed, I milked some of the contents back to the stomach.    I did not try to mobilize the distal small bowel that was bound in the pelvis,  because it was decompressed and did not appear to be contributing to the bowel obstruction.  I thought that any further enterolysis would involve significant time and contribute to the morbidity to the operation.  I thought I had released his cause of his bowel obstruction, brought him to the operating room.  I irrigated the abdomen with 3 liters of saline.  I pulled the omentum down underneath the midline incision.  I then closed the abdomen with running #1 Novafil sutures x 2, I placed some interrupted #1 PDS about every third stitch.  I irrigated the wound with saline, closed the skin with staples.  I did re-laparoscope the patient to make sure there was no problems with the anterior abdominal wall.  The wound was closed with staples. The patient will be transferred to the recovery room.  I will put him in the ICU overnight for observation.   Sandria Bales. Ezzard Standing, M.D., FACS   DHN/MEDQ  D:  03/05/2012  T:  03/06/2012  Job:  [id number]  cc:   Tracey Harries, M.D. Fax: [phone]  Nanetta Batty, M.D. Fax: [phone]  Benjiman Core, M.D. Fax: [phone]

## 2012-03-07 ENCOUNTER — Encounter (HOSPITAL_COMMUNITY): Payer: Self-pay | Admitting: General Surgery

## 2012-03-07 DIAGNOSIS — Z7901 Long term (current) use of anticoagulants: Secondary | ICD-10-CM

## 2012-03-07 LAB — GLUCOSE, CAPILLARY
Glucose-Capillary: 168 mg/dL — ABNORMAL HIGH (ref 70–99)
Glucose-Capillary: 119 mg/dL — ABNORMAL HIGH (ref 70–99)
Glucose-Capillary: 111 mg/dL — ABNORMAL HIGH (ref 70–99)

## 2012-03-07 LAB — CBC WITH DIFFERENTIAL/PLATELET
Basophils Absolute: 0 10*3/uL (ref 0.0–0.1)
Basophils Relative: 0 % (ref 0–1)
Eosinophils Absolute: 0 10*3/uL (ref 0.0–0.7)
HCT: 27.2 % — ABNORMAL LOW (ref 39.0–52.0)
Hemoglobin: 9.2 g/dL — ABNORMAL LOW (ref 13.0–17.0)
Lymphocytes Relative: 5 % — ABNORMAL LOW (ref 12–46)
Lymphs Abs: 0.4 10*3/uL — ABNORMAL LOW (ref 0.7–4.0)
MCHC: 33.8 g/dL (ref 30.0–36.0)
MCV: 89.8 fL (ref 78.0–100.0)
Neutro Abs: 6.5 10*3/uL (ref 1.7–7.7)
RDW: 16.7 % — ABNORMAL HIGH (ref 11.5–15.5)

## 2012-03-07 LAB — BASIC METABOLIC PANEL
BUN: 12 mg/dL (ref 6–23)
CO2: 31 mEq/L (ref 19–32)
Chloride: 94 mEq/L — ABNORMAL LOW (ref 96–112)
Creatinine, Ser: 0.88 mg/dL (ref 0.50–1.35)
GFR calc Af Amer: >90 mL/min (ref >90)
Glucose, Bld: 126 mg/dL — ABNORMAL HIGH (ref 70–99)
Potassium: 3.7 mEq/L (ref 3.5–5.1)

## 2012-03-07 LAB — PROTIME-INR
INR: 1.71 — ABNORMAL HIGH (ref 0.00–1.49)
Prothrombin Time: 19.5 s — ABNORMAL HIGH (ref 11.6–15.2)

## 2012-03-07 MED ORDER — SODIUM CHLORIDE 0.9 % IV BOLUS (SEPSIS)
250.0000 mL | Freq: Once | INTRAVENOUS | Status: AC
  Administered 2012-03-07: 250 mL via INTRAVENOUS

## 2012-03-07 MED ORDER — PANTOPRAZOLE SODIUM 40 MG IV SOLR
40.0000 mg | INTRAVENOUS | Status: DC
  Administered 2012-03-07 – 2012-03-10 (×4): 40 mg via INTRAVENOUS
  Filled 2012-03-07 (×6): qty 40

## 2012-03-07 MED ORDER — WARFARIN SODIUM 3 MG PO TABS
3.0000 mg | ORAL_TABLET | Freq: Once | ORAL | Status: AC
  Administered 2012-03-07: 3 mg via ORAL
  Filled 2012-03-07: qty 1

## 2012-03-07 MED ORDER — HEPARIN SODIUM (PORCINE) 5000 UNIT/ML IJ SOLN
5000.0000 [IU] | Freq: Three times a day (TID) | INTRAMUSCULAR | Status: DC
  Administered 2012-03-07 – 2012-03-08 (×3): 5000 [IU] via SUBCUTANEOUS
  Filled 2012-03-07 (×6): qty 1

## 2012-03-07 MED ORDER — ALBUTEROL SULFATE (5 MG/ML) 0.5% IN NEBU
2.5000 mg | INHALATION_SOLUTION | Freq: Four times a day (QID) | RESPIRATORY_TRACT | Status: DC
  Administered 2012-03-07 – 2012-03-14 (×27): 2.5 mg via RESPIRATORY_TRACT
  Filled 2012-03-07 (×25): qty 0.5

## 2012-03-07 MED ORDER — SODIUM CHLORIDE 0.9 % IV BOLUS (SEPSIS): 500.0000 mL | Freq: Once | INTRAVENOUS | Status: DC

## 2012-03-07 MED ORDER — IPRATROPIUM BROMIDE 0.02 % IN SOLN
0.5000 mg | Freq: Four times a day (QID) | RESPIRATORY_TRACT | Status: DC
  Administered 2012-03-07 – 2012-03-14 (×27): 0.5 mg via RESPIRATORY_TRACT
  Filled 2012-03-07 (×26): qty 2.5

## 2012-03-07 MED ORDER — ACETAMINOPHEN 10 MG/ML IV SOLN
1000.0000 mg | Freq: Four times a day (QID) | INTRAVENOUS | Status: AC
  Administered 2012-03-07 – 2012-03-08 (×3): 1000 mg via INTRAVENOUS
  Filled 2012-03-07 (×4): qty 100

## 2012-03-07 MED ORDER — WARFARIN - PHARMACIST DOSING INPATIENT
Freq: Every day | Status: DC
  Administered 2012-03-08: 18:00:00

## 2012-03-07 NOTE — Progress Notes (Signed)
Patient ID: Vincent [name], male   DOB: 12/08/1932, 78 y.o.   MRN: [id number]  General Surgery - [facility] - Progress Note  POD# 2  Subjective: Patient awake and responsive.  Mild pain.  No nausea or emesis.  Patient pulled out NG tube and IV's.  Objective: Vital signs in last 24 hours: Temp:  [97.4 F (36.3 C)-98.2 F (36.8 C)] 98 F (36.7 C) (11/04 0400) Pulse Rate:  [55-159] 55  (11/04 0600) Resp:  [16-27] 23  (11/04 0600) BP: (99-154)/(41-76) 112/58 mmHg (11/04 0600) SpO2:  [91 %-99 %] 99 % (11/04 0600) Weight:  [194 lb 10.7 oz (88.3 kg)] 194 lb 10.7 oz (88.3 kg) (11/04 0600)    Intake/Output from previous day: 11/03 0701 - 11/04 0700 In: 3135.7 [I.V.:3035.7; IV Piggyback:100] Out: 2475 [Urine:1575; Emesis/NG output:900]  Exam: HEENT - clear, not icteric Neck - soft Chest - coarse rhonchi bilaterally Cor - rate controlled Abd - soft without distension; quiet; no output at stoma; dressings dry Ext - no significant edema Neuro - grossly intact, no focal deficits  Lab Results:   [facility] 03/07/12 0403 03/06/12 0320  WBC 7.5 7.0  HGB 9.2* 10.1*  HCT 27.2* 29.2*  PLT 179 213     Basename 03/07/12 0403 03/06/12 0320  NA 130* 127*  K 3.7 4.5  CL 94* 92*  CO2 31 27  GLUCOSE 126* 213*  BUN 12 11  CREATININE 0.88 0.80  CALCIUM 8.4 8.3*    Studies/Results: No results found.  Assessment / Plan: 1. EXPLORATORY LAPAROTOMY, LAPAROSCOPIC LYSIS OF ADHESIONS - D. Newman - 03/05/2012   For SBO (small bowel obstruction) secondary to adhesive band - no obvious intra-peritoneal cancer.   Ileus - await resolution, allow sips today, leave NG out   Will leave in step down due to age, severity of illness, and early post op - may move to floor if bed needed 2. Rectal carcinoma - Dr. Earl Gala oncologist   Primary resection by Dr. Nat Christen - 02/13/2010   Has pelvic recurrence and has been on FOLFIRI  3. PAF (paroxysmal atrial fibrillation)   Has seen  Dr. Erlene Quan   Was on coumadin - held for now 4. HTN (hypertension)  5. AAA (abdominal aortic aneurysm) without rupture  6. DVT proph - SQ heparin currently  Velora Heckler, MD, [facility] Office: [phone]  03/07/2012

## 2012-03-07 NOTE — Progress Notes (Signed)
ANTICOAGULATION CONSULT NOTE - Initial Consult  Pharmacy Consult for Heparin and Coumadin Indication: Hx A.fib  No Known Allergies  Patient Measurements: Height: 5\' 11"  (180.3 cm) Weight: 194 lb 10.7 oz (88.3 kg) IBW/kg (Calculated) : 75.3  Heparin Dosing Weight: 88 kg  Vital Signs: Temp: 98 F (36.7 C) (11/04 0400) Temp src: Oral (11/04 0400) BP: 123/58 mmHg (11/04 0800) Pulse Rate: 37  (11/04 0800)  Labs:  Basename 03/07/12 1100 03/07/12 0403 03/06/12 0320 03/05/12 0847 03/05/12 0508 03/04/12 1454  HGB -- 9.2* 10.1* -- -- --  HCT -- 27.2* 29.2* -- 27.4* --  PLT -- 179 213 -- 186 --  APTT -- -- -- -- -- --  LABPROT 19.5* -- -- 19.1* -- 18.2*  INR 1.71* -- -- 1.66* -- 1.56*  HEPARINUNFRC -- -- -- -- -- --  CREATININE -- 0.88 0.80 -- 0.91 --  CKTOTAL -- -- -- -- -- --  CKMB -- -- -- -- -- --  TROPONINI -- -- -- -- -- --    Estimated Creatinine Clearance: 76.1 ml/min (by C-G formula based on Cr of 0.88).   Medical History: Past Medical History  Diagnosis Date  . Hypertension   . Arthritis   . Heart disease   . SOB (shortness of breath)   . Malignant neoplasm of hepatic flexure   . Malignant neoplasm of rectosigmoid junction   . Cancer     and/or colon polyp  . Malignant neoplasm of hepatic flexure   . Malignant neoplasm of rectosigmoid junction   . Diabetes mellitus   . Atrial fibrillation   . Colostomy care   . AAA (abdominal aortic aneurysm) without rupture 03/02/2012    Medications:  Prescriptions prior to admission  Medication Sig Dispense Refill  . albuterol-ipratropium (COMBIVENT) 18-103 MCG/ACT inhaler Inhale 1 puff into the lungs every 4 (four) hours as needed. WHEEZING      . aspirin EC 81 MG tablet Take 81 mg by mouth daily.      . digoxin (LANOXIN) 0.125 MG tablet Take 1 tablet (125 mcg total) by mouth daily.  30 tablet  0  . Fluticasone-Salmeterol (ADVAIR DISKUS) 500-50 MCG/DOSE AEPB Inhale 1 puff into the lungs every 12 (twelve) hours.  60  each  3  . HYDROmorphone (DILAUDID) 4 MG tablet Take 4 mg by mouth every 6 (six) hours as needed. Pain      . metFORMIN (GLUCOPHAGE) 500 MG tablet Take 500 mg by mouth daily with breakfast.       . nitroGLYCERIN (NITROSTAT) 0.3 MG SL tablet Place 0.3 mg under the tongue every 5 (five) minutes as needed.      Marland Kitchen oxyCODONE (OXY IR/ROXICODONE) 5 MG immediate release tablet Take 5 mg by mouth every 6 (six) hours as needed. Breakthrough pain      . pantoprazole (PROTONIX) 40 MG tablet Take 1 tablet (40 mg total) by mouth daily at 12 noon.  30 tablet  0  . simvastatin (ZOCOR) 40 MG tablet Take 1 tablet (40 mg total) by mouth at bedtime.  30 tablet  0  . temazepam (RESTORIL) 15 MG capsule Take 15 mg by mouth at bedtime as needed.      . warfarin (COUMADIN) 5 MG tablet Take 0.5 tablets (2.5 mg total) by mouth daily. Pt takes 5 mg every day except on Monday and Friday pt takes 2.5mg  ( 1/2 tab of 5 mg tablet)  30 tablet  0  . zolpidem (AMBIEN) 5 MG tablet Take 1 tablet (5  mg total) by mouth at bedtime as needed for sleep (insomnia).  30 tablet  0    Assessment:  78yo M admitted with SBO 10/29.  Underwent open surgery for SBO & LOA on 11/2.  On chronic Coumadin for hx A.fib, held since admission. Remains in A.fib.  SQ heparin for VTE prophylaxis was resumed 11/3. Last dose given 06:26 11/4.  Pharmacy asked to resuming anticoagulation 11/4 with IV heparin and bridge to Coumadin.  INR has risen from 1.66 pre-op to 1.71 today without any Coumadin administered. Spoke with Dr. Elisabeth Pigeon, since INR is so close to therapeutic and in the post-op setting we will continue SQ heparin until INR is therapeutic rather than start a drip. Anticipate INR will be >2 by am.  Remains NPO except meds.  Goal of Therapy:  INR 2-3 Heparin level 0.3-0.7 units/ml Monitor platelets by anticoagulation protocol: Yes   Plan:   Resume heparin 5000 units SQ q8h.  Give Coumadin 3mg  today.  F/u daily PT/INR.  Charolotte Eke,  PharmD, pager [phone]. 03/07/2012,12:25 PM.

## 2012-03-07 NOTE — Progress Notes (Signed)
After 500 mL NS and 250 mL NS boluses pt SBP still ranging from 80's-90's with MAP predominantly in mid-upper 50's. Orders received and administered for another 250 mL ns bolus. Pt still A-Fib with rate 50-70. Still sedated due to afternoon medications but arousable and will follow commands.

## 2012-03-07 NOTE — Progress Notes (Signed)
[facility] PROGRESS NOTE  Vincent [name] GNF:[id number] DOB: Nov 27, 1932 DOA: 03/01/2012 PCP: Aura Dials, MD  Brief narrative: 78 year old male with previous history of rectal carcinoma (diagnosed in 2011) status post surgical resection and colostomy, radiation and chemotherapy, recurrent carcinoma with nodal metastases based on CT abdomen/pelvis findings. Patient is admitted for small bowel obstruction, treated conservatively for past 5 days with no significant clinical changes. Patient is status post exploratory laparotomy, POD #2, on PCA morphine. Patient still complains of pain status post surgery.   Assessment/Plan:   Principal Problem:  *Small bowel obstruction  Status post exploratory laparotomy POD #2  Secondary to adhesions; per surgery no intraperitoneal cancer  Patient pulled out NG tube and per surgery we will not reinsert it; allow small sips of liquid Appreciate surgery following and their recomendations Of note, patient has received 2 units of FFP to reverse INR prior to surgery  Active Problem:  Anemia of chronic disease  Secondary to history of malignancy  Hgb 9.2; drop from 10.1 since surgery No signs of active bleed F/U CBC in am Atrial fibrillation  HR elevated in 140's in past 24 hours Started Cardizem drip and HR now low 100's Hemodynamically stable Start coumadin per pharmacy; no heparin drip Hyponatremia  Likely due to 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 recurrent metastatic rectal carcinoma  Changed IV fluids to NS (from D5 NS) Follow up sodium this morning 130 Neutropenia  Secondary to malignancy  Resolved  Code Status: DNR  Family Communication: family not at bedside  Disposition Plan: home when stable; PT evaluation when patient stable   Consultants:  Surgery Oncology (Dr. Clelia Croft) Procedures:  Exploratory laparotomy 03/05/2012 Antibiotics:  Cefoxin 03/05/2012 -->03/06/2012  Manson Passey, MD  [facility]  Pager [phone]  If 7PM-7AM, please contact  night-coverage [email or web address] Password [email or web address] 03/07/2012, 9:15 AM   LOS: 6 days   HPI/Subjective: No acute overnight event. Sitting in chair this morning.  Objective: Filed Vitals:   03/07/12 0445 03/07/12 0500 03/07/12 0600 03/07/12 0800  BP:  104/47 112/58 123/58  Pulse: 79 85 55 37  Temp:      TempSrc:      Resp: 26 21 23 25   Height:      Weight:   88.3 kg (194 lb 10.7 oz)   SpO2: 98% 98% 99% 89%    Intake/Output Summary (Last 24 hours) at 03/07/12 0915 Last data filed at 03/07/12 0900  Gross per 24 hour  Intake 3329.67 ml  Output   2325 ml  Net 1004.67 ml    Exam:   General:  Pt is alert, follows commands appropriately, not in acute distress  Cardiovascular: irregular rhythm, slightly tachycardic, S1/S2 appreciated  Respiratory: diminished breath sounds bilaterally, no wheezing, no crackles, no rhonchi  Abdomen: tender across mid abdomen, hard to palpation, bowel sounds present, no guarding  Extremities: only trace edema edema, pulses DP and PT palpable bilaterally  Neuro: Grossly nonfocal  Data Reviewed: Basic Metabolic Panel:  Lab 03/07/12 5284 03/06/12 0320 03/05/12 0508 03/04/12 0429 03/03/12 0429  NA 130* 127* 131* 132* 130*  K 3.7 4.5 3.5 3.5 3.5  CL 94* 92* 96 96 94*  CO2 31 27 28 26 30   GLUCOSE 126* 213* 208* 77 104*  BUN 12 11 15 16 10   CREATININE 0.88 0.80 0.91 0.99 0.92  CALCIUM 8.4 8.3* 8.7 8.8 8.8   CBC:  Lab 03/07/12 0403 03/06/12 0320 03/05/12 0508 03/04/12 0429 03/03/12 0429 03/01/12 2000  WBC 7.5 7.0 2.6* 3.5* 3.9* --  HGB 9.2* 10.1* 9.3* 9.5* 9.7* --  HCT 27.2* 29.2* 27.4* 28.7* 28.5* --  MCV 89.8 89.3 90.1 91.4 90.8 --  PLT 179 213 186 195 178 --   CBG:  Lab 03/07/12 0620 03/06/12 2339 03/06/12 1811 03/06/12 1132 03/06/12 0800  GLUCAP 119* 120* 114* 172* 168*    SURGICAL PCR SCREEN     Status: Normal   Collection Time   03/04/12  1:31 PM      Component Value Range Status Comment   MRSA, PCR NEGATIVE  NEGATIVE Final     Staphylococcus aureus NEGATIVE  NEGATIVE Final      Studies: No results found.  Scheduled Meds:   . antiseptic oral rinse  15 mL Mouth Rinse q12n4p  . chlorhexidine  15 mL Mouth Rinse BID  . heparin subcutaneous  5,000 Units Subcutaneous Q8H  . insulin aspart  0-15 Units Subcutaneous Q6H   Continuous Infusions:  . sodium chloride 125 mL/hr at 03/06/12 1224  . diltiazem (CARDIZEM) infusion 5 mg/hr (03/07/12 0900)

## 2012-03-07 NOTE — Progress Notes (Signed)
General Surgery [facility]  Patient seen and examined.  Known to my surgical service.  See my progress note from today.  Velora Heckler, MD, [facility] Office: [phone]

## 2012-03-07 NOTE — Progress Notes (Signed)
2 Days Post-Op  Subjective: Feels pretty bad, wheezing, abdomen hurts, no flatus and they are trying to mobilize him and he's not very happy about it.  Objective: Vital signs in last 24 hours: Temp:  [97.4 F (36.3 C)-98.2 F (36.8 C)] 98 F (36.7 C) (11/04 0400) Pulse Rate:  [55-159] 55  (11/04 0600) Resp:  [16-27] 23  (11/04 0600) BP: (99-154)/(41-76) 112/58 mmHg (11/04 0600) SpO2:  [91 %-99 %] 99 % (11/04 0600) Weight:  [194 lb 10.7 oz (88.3 kg)] 194 lb 10.7 oz (88.3 kg) (11/04 0600)   900 from NG/OG, no stool recorded.  Diet: NPO,afebrile, VSS, Na 130, 5 liter positve on I/O. Telem shows Afib with PVC'S Intake/Output from previous day: 11/03 0701 - 11/04 0700 In: 3135.7 [I.V.:3035.7; IV Piggyback:100] Out: 2475 [Urine:1575; Emesis/NG output:900] Intake/Output this shift:    General appearance: alert, cooperative, mild distress and uncomfortable and unhappy at being made to get OOB Resp: Wheezing bilat, few rales in bases. GI: tender, incision looks good no flatus, no bowel sounds. Nothing in his ostomyl bag. Extremities: ? trace edema.  Lab Results:   [facility] 03/07/12 0403 03/06/12 0320  WBC 7.5 7.0  HGB 9.2* 10.1*  HCT 27.2* 29.2*  PLT 179 213    BMET  Basename 03/07/12 0403 03/06/12 0320  NA 130* 127*  K 3.7 4.5  CL 94* 92*  CO2 31 27  GLUCOSE 126* 213*  BUN 12 11  CREATININE 0.88 0.80  CALCIUM 8.4 8.3*   PT/INR  Basename 03/05/12 0847 03/04/12 1454  LABPROT 19.1* 18.2*  INR 1.66* 1.56*    No results found for this basename: AST:5,ALT:5,ALKPHOS:5,BILITOT:5,PROT:5,ALBUMIN:5 in the last 168 hours   Lipase     Component Value Date/Time   LIPASE 12 01/10/2012 2200     Studies/Results: No results found.  Medications:    . antiseptic oral rinse  15 mL Mouth Rinse q12n4p  . [COMPLETED] cefOXitin  1 g Intravenous Q8H  . chlorhexidine  15 mL Mouth Rinse BID  . [COMPLETED] diltiazem  5 mg Intravenous Once  . heparin subcutaneous  5,000 Units  Subcutaneous Q8H  . insulin aspart  0-15 Units Subcutaneous Q6H  . [EXPIRED] LORazepam      . morphine   Intravenous Q4H      . sodium chloride 125 mL/hr at 03/06/12 1224  . diltiazem (CARDIZEM) infusion 5 mg/hr (03/07/12 0530)  . [DISCONTINUED] dextrose 5 % and 0.45 % NaCl with KCl 20 mEq/L 125 mL/hr at 03/06/12 0814      No current facility-administered medications on file prior to encounter.   Current Outpatient Prescriptions on File Prior to Encounter  Medication Sig Dispense Refill  . albuterol-ipratropium (COMBIVENT) 18-103 MCG/ACT inhaler Inhale 1 puff into the lungs every 4 (four) hours as needed. WHEEZING      . digoxin (LANOXIN) 0.125 MG tablet Take 1 tablet (125 mcg total) by mouth daily.  30 tablet  0  . Fluticasone-Salmeterol (ADVAIR DISKUS) 500-50 MCG/DOSE AEPB Inhale 1 puff into the lungs every 12 (twelve) hours.  60 each  3  . HYDROmorphone (DILAUDID) 4 MG tablet Take 4 mg by mouth every 6 (six) hours as needed. Pain      . metFORMIN (GLUCOPHAGE) 500 MG tablet Take 500 mg by mouth daily with breakfast.       . nitroGLYCERIN (NITROSTAT) 0.3 MG SL tablet Place 0.3 mg under the tongue every 5 (five) minutes as needed.      Marland Kitchen oxyCODONE (OXY IR/ROXICODONE) 5 MG immediate  release tablet Take 5 mg by mouth every 6 (six) hours as needed. Breakthrough pain      . pantoprazole (PROTONIX) 40 MG tablet Take 1 tablet (40 mg total) by mouth daily at 12 noon.  30 tablet  0  . simvastatin (ZOCOR) 40 MG tablet Take 1 tablet (40 mg total) by mouth at bedtime.  30 tablet  0  . temazepam (RESTORIL) 15 MG capsule Take 15 mg by mouth at bedtime as needed.      . warfarin (COUMADIN) 5 MG tablet Take 0.5 tablets (2.5 mg total) by mouth daily. Pt takes 5 mg every day except on Monday and Friday pt takes 2.5mg  ( 1/2 tab of 5 mg tablet)  30 tablet  0  . zolpidem (AMBIEN) 5 MG tablet Take 1 tablet (5 mg total) by mouth at bedtime as needed for sleep (insomnia).  30 tablet  0      Assessment/Plan EXPLORATORY LAPAROTOMY, LAPAROSCOPIC LYSIS OF ADHESIONS - D. Newman - 03/05/2012  For SBO (small bowel obstruction) secondary to adhesive band - no obvious intra-peritoneal cancer.  Ileus - cont NGT S/P rectosigmoid resection with chemotherapy, for salvage Dr. Clelia Croft. Advanced Rectal carcinoma T4N2 - Dr. Earl Gala oncologist, Primary resection by Dr. Nat Christen - 02/13/2010 PAF (paroxysmal atrial fibrillation) On chronic coumadin/DR. Berry HTN AAA  DVT proph - SQ hep Sleep Apnea,  Tobacco use 60 years  Plan:  He is 6 days into this since admission, he was 20 hours into it on admit 10/29, so 7 days without nutrition. ?TNA?  i will check prealbumin. Restart nebs, PPI,, and  start heparin slowly.  He is on chronic coumadin. Surgery was completed around 1:30 PM 03/05/12. I don't see an Echo, since 2011, so I'm not sure what his cardiac status is.  55-65% with grade 1 diastolic dysfunction , Mild AR(02/2010).  Will also ask family to bring in CPAP. Decrease IV to 100 ML per hour.       LOS: 6 days    Vincent [name] 03/07/2012

## 2012-03-07 NOTE — Evaluation (Signed)
Physical Therapy Evaluation Patient Details Name: Vincent [name] MRN: [id number] DOB: 09-16-1932 Today's Date: 03/07/2012 Time: 8469-6295 PT Time Calculation (min): 27 min  PT Assessment / Plan / Recommendation Clinical Impression  Pt is s/p EXPLORATORY LAPAROTOMY, LAPAROSCOPIC LYSIS OF ADHESIONS - Dr. Ezzard Standing - 03/05/2012; hx rectal CA with colostomy; will benfitf rom PT to maximize independence for possible return home with wife vs SNF depending on home support and progress    PT Assessment  Patient needs continued PT services    Follow Up Recommendations  Home health PT;Supervision - Intermittent;Other (comment) (vs STSNF)    Does the patient have the potential to tolerate intense rehabilitation      Barriers to Discharge        Equipment Recommendations  None recommended by PT    Recommendations for Other Services     Frequency Min 3X/week    Precautions / Restrictions Precautions Precautions: Fall;Other (comment) Precaution Comments: abd incision Restrictions Weight Bearing Restrictions: No   Pertinent Vitals/Pain BP 124/56 Sats 92-96% on 4L RR 21-27 HR110-125      Mobility  Bed Mobility Bed Mobility: Supine to Sit Supine to Sit: 1: +2 Total assist;HOB elevated Supine to Sit: Patient Percentage: 50% Details for Bed Mobility Assistance: +2 lines, safety, trunk and LE assist; multi-modal cues for technique Transfers Transfers: Sit to Stand;Stand to Sit;Stand Pivot Transfers Sit to Stand: 1: +2 Total assist;From bed Sit to Stand: Patient Percentage: 70% Stand to Sit: 1: +2 Total assist;To chair/3-in-1 Stand to Sit: Patient Percentage: 80% Stand Pivot Transfers: 1: +2 Total assist Stand Pivot Transfers: Patient Percentage: 70% Details for Transfer Assistance: +2 lines and safety,  multi-modal cues for RW safety, posture Ambulation/Gait Ambulation/Gait Assistance: Not tested (comment)    Shoulder Instructions     Exercises     PT Diagnosis: Difficulty  walking  PT Problem List: Decreased activity tolerance;Decreased balance;Decreased mobility;Decreased knowledge of use of DME;Decreased safety awareness PT Treatment Interventions: DME instruction;Gait training;Functional mobility training;Therapeutic activities;Therapeutic exercise;Balance training;Patient/family education   PT Goals Acute Rehab PT Goals PT Goal Formulation: With patient Time For Goal Achievement: 03/21/12 Potential to Achieve Goals: Good Pt will go Supine/Side to Sit: with supervision PT Goal: Supine/Side to Sit - Progress: Goal set today Pt will go Sit to Stand: with supervision PT Goal: Sit to Stand - Progress: Goal set today Pt will go Stand to Sit: with supervision PT Goal: Stand to Sit - Progress: Goal set today Pt will Ambulate: 51 - 150 feet;with supervision;with least restrictive assistive device PT Goal: Ambulate - Progress: Goal set today  Visit Information  Last PT Received On: 03/07/12 Assistance Needed: +2 (safety)    Subjective Data  Subjective: Are we in [place]? I want to go home. Patient Stated Goal: none   Prior Functioning  Home Living Lives With: Spouse Available Help at Discharge: Family Type of Home: House Home Access: Level entry Home Layout: One level Home Adaptive Equipment: Walker - rolling Additional Comments: pt intermittently confused, no family present,  unsure of accuracy of info pt able to give Prior Function Level of Independence: Independent Communication Communication: No difficulties    Cognition  Overall Cognitive Status: Impaired Area of Impairment: Memory;Following commands;Safety/judgement;Awareness of deficits Arousal/Alertness: Awake/alert Orientation Level: Disoriented X4;Place;Time;Situation Behavior During Session: Flat affect Following Commands: Follows one step commands inconsistently Safety/Judgement: Decreased safety judgement for tasks assessed;Decreased awareness of need for assistance      Extremity/Trunk Assessment Right Upper Extremity Assessment RUE ROM/Strength/Tone: [facility] for tasks assessed Left Upper Extremity  Assessment LUE ROM/Strength/Tone: [facility] for tasks assessed Right Lower Extremity Assessment RLE ROM/Strength/Tone: [facility] for tasks assessed Left Lower Extremity Assessment LLE ROM/Strength/Tone: [facility] for tasks assessed   Balance Static Sitting Balance Static Sitting - Balance Support: Right upper extremity supported;Feet supported Static Sitting - Level of Assistance: 4: Min assist;5: Stand by assistance Static Standing Balance Static Standing - Balance Support: Bilateral upper extremity supported;During functional activity Static Standing - Level of Assistance: 4: Min assist;3: Mod assist  End of Session PT - End of Session Activity Tolerance: Patient tolerated treatment well Patient left: in chair;with call bell/phone within reach Nurse Communication: Mobility status  GP     [facility] 03/07/2012, 11:00 AM

## 2012-03-07 NOTE — Progress Notes (Signed)
IP PROGRESS NOTE  Subjective:   Principle Diagnosis: This is a 78 year old gentleman diagnosed with adenocarcinoma of the rectum. He had T4a N2 disease diagnosed in 2011. He presented obstruction of the distal rectal area. Now has local relapse.  Prior Therapy:  1. Status post rectosigmoid resection and segmental resection on February 13, 2010. Tumor was poorly differentiated adenocarcinoma, 8 out of 16 lymph nodes involved, tumor not completely resected at that time.  2. The patient received radiation therapy adamantly concomitantly with Xeloda. Therapy concluded in January 2012.  3. Treated with adjuvant FOLFOX for a total of 7 cycles of therapy. The therapy concluded Sep 16, 2010.  Current therapy: FOLFIRI for salvage purposes. He is S/P the third cycle on 10/23.   Patient was hospitalized on 10/29 with small bowel obstruction.  I was asked by Dr. Gerrit Friends to see patient while in the hospital.   Patient S/P Laparoscopy converted to open enterolysis of adhesions with oversew of enterotomy done on 03/05/2012.  No cancer noted as the cause of obstruction.   Patient is recovering slowly at this time.  He still very weak this am. He had morphine earlier today.  No pain reported.    Objective:  Vital signs in last 24 hours: Temp:  [97.4 F (36.3 C)-98.2 F (36.8 C)] 98 F (36.7 C) (11/04 0400) Pulse Rate:  [37-159] 37  (11/04 0800) Resp:  [16-27] 25  (11/04 0800) BP: (99-154)/(41-76) 123/58 mmHg (11/04 0800) SpO2:  [89 %-100 %] 100 % (11/04 0941) Weight:  [194 lb 10.7 oz (88.3 kg)] 194 lb 10.7 oz (88.3 kg) (11/04 0600) Weight change: 1 lb 12.2 oz (0.8 kg)    Intake/Output from previous day: 11/03 0701 - 11/04 0700 In: 3260.7 [I.V.:3160.7; IV Piggyback:100] Out: 2475 [Urine:1575; Emesis/NG output:900]  Mouth: mucous membranes moist, pharynx normal without lesions Resp: rhonchi bilaterally Cardio: irregularly irregular rhythm GI: soft, non-tender; bowel sounds normal; no masses,   no organomegaly Extremities: extremities normal, atraumatic, no cyanosis or edema  Portacath without any issues  Lab Results:  Basename 03/07/12 0403 03/06/12 0320  WBC 7.5 7.0  HGB 9.2* 10.1*  HCT 27.2* 29.2*  PLT 179 213    BMET  Basename 03/07/12 0403 03/06/12 0320  NA 130* 127*  K 3.7 4.5  CL 94* 92*  CO2 31 27  GLUCOSE 126* 213*  BUN 12 11  CREATININE 0.88 0.80  CALCIUM 8.4 8.3*    Medications: I have reviewed the patient's current medications.  Assessment/Plan: This is a pleasant 78 year old gentleman with the following issues:  1.  Advanced rectal carcinoma, presented with a T4 N2 disease. He presented with a complete obstruction. After segmental resection, he underwent radiation therapy with Xeloda and subsequently systemic chemotherapy. At this time, he has evidence of recurrent disease with pelvic lymph nodes. He S/P the third cycle of FOLFIRI on 10/23.  This will be on hold for now till he recovers from this hospitalizations.   2. Small bowel obstruction: He is status post exploratory laparotomy on 11/2. This thought to be secondary to adhesions; per surgery no intraperitoneal cancer  Continue supportive care as you are doing.   3. Afib: rate controlled. Anticoagulation resumed POD #1.     LOS: 6 days   Aiyana Stegmann 03/07/2012, 10:11 AM

## 2012-03-07 NOTE — Progress Notes (Addendum)
Pt SBP remains 80-91 with MAP <60 after total 1 L NS boluses. Lungs sounds now coarse crackles. NP notified and will consult CCM and devise plan to manage pt.  CCM  MD via eLink, advised of pt's hospital Hx, current status, and recent interventions. Orders received and administered for labs and phenylepherine drip. Current BP 112/46 (62).

## 2012-03-08 ENCOUNTER — Inpatient Hospital Stay (HOSPITAL_COMMUNITY): Payer: Medicare Other

## 2012-03-08 DIAGNOSIS — I959 Hypotension, unspecified: Secondary | ICD-10-CM | POA: Diagnosis not present

## 2012-03-08 LAB — BASIC METABOLIC PANEL
BUN: 15 mg/dL (ref 6–23)
CO2: 29 mEq/L (ref 19–32)
Calcium: 8.1 mg/dL — ABNORMAL LOW (ref 8.4–10.5)
Chloride: 98 mEq/L (ref 96–112)
Chloride: 95 mEq/L — ABNORMAL LOW (ref 96–112)
Creatinine, Ser: 0.97 mg/dL (ref 0.50–1.35)
Creatinine, Ser: 0.86 mg/dL (ref 0.50–1.35)
GFR calc Af Amer: >90 mL/min (ref >90)
GFR calc non Af Amer: 78 mL/min — ABNORMAL LOW (ref >90)
Glucose, Bld: 146 mg/dL — ABNORMAL HIGH (ref 70–99)
Potassium: 3.6 mEq/L (ref 3.5–5.1)

## 2012-03-08 LAB — CBC WITH DIFFERENTIAL/PLATELET
Basophils Absolute: 0 10*3/uL (ref 0.0–0.1)
Eosinophils Relative: 1 % (ref 0–5)
MCH: 30.5 pg (ref 26.0–34.0)
Monocytes Absolute: 0.4 10*3/uL (ref 0.1–1.0)
Neutrophils Relative %: 82 % — ABNORMAL HIGH (ref 43–77)
Platelets: 151 10*3/uL (ref 150–400)
RBC: 2.49 MIL/uL — ABNORMAL LOW (ref 4.22–5.81)
WBC: 4 10*3/uL (ref 4.0–10.5)

## 2012-03-08 LAB — CBC
HCT: 24.7 % — ABNORMAL LOW (ref 39.0–52.0)
Hemoglobin: 8.3 g/dL — ABNORMAL LOW (ref 13.0–17.0)
MCV: 90.8 fL (ref 78.0–100.0)
RBC: 2.72 MIL/uL — ABNORMAL LOW (ref 4.22–5.81)
RDW: 17 % — ABNORMAL HIGH (ref 11.5–15.5)
WBC: 6.5 10*3/uL (ref 4.0–10.5)

## 2012-03-08 LAB — HEPATIC FUNCTION PANEL
Albumin: 1.8 g/dL — ABNORMAL LOW (ref 3.5–5.2)
Bilirubin, Direct: 0.2 mg/dL (ref 0.0–0.3)
Indirect Bilirubin: 0.2 mg/dL — ABNORMAL LOW (ref 0.3–0.9)
Total Bilirubin: 0.4 mg/dL (ref 0.3–1.2)

## 2012-03-08 LAB — GLUCOSE, CAPILLARY
Glucose-Capillary: 111 mg/dL — ABNORMAL HIGH (ref 70–99)
Glucose-Capillary: 124 mg/dL — ABNORMAL HIGH (ref 70–99)

## 2012-03-08 LAB — LACTIC ACID, PLASMA: Lactic Acid, Venous: 0.7 mmol/L (ref 0.5–2.2)

## 2012-03-08 LAB — PHOSPHORUS: Phosphorus: 3.1 mg/dL (ref 2.3–4.6)

## 2012-03-08 LAB — PRO B NATRIURETIC PEPTIDE: Pro B Natriuretic peptide (BNP): 2257 pg/mL — ABNORMAL HIGH (ref 0–450)

## 2012-03-08 LAB — TROPONIN I: Troponin I: <0.3 ng/mL (ref <0.30)

## 2012-03-08 LAB — CARBOXYHEMOGLOBIN
Methemoglobin: 0.9 % (ref 0.0–1.5)
Total hemoglobin: 8.8 g/dL — ABNORMAL LOW (ref 13.5–18.0)

## 2012-03-08 LAB — PROTIME-INR: INR: 2.05 — ABNORMAL HIGH (ref 0.00–1.49)

## 2012-03-08 MED ORDER — ONDANSETRON HCL 4 MG/2ML IJ SOLN
4.0000 mg | Freq: Three times a day (TID) | INTRAMUSCULAR | Status: DC | PRN
  Administered 2012-03-08: 4 mg via INTRAVENOUS

## 2012-03-08 MED ORDER — PHENYLEPHRINE HCL 10 MG/ML IJ SOLN
30.0000 ug/min | INTRAMUSCULAR | Status: DC
  Administered 2012-03-08: 45 ug/min via INTRAVENOUS
  Filled 2012-03-08 (×3): qty 2

## 2012-03-08 MED ORDER — POTASSIUM CHLORIDE 10 MEQ/100ML IV SOLN
10.0000 meq | INTRAVENOUS | Status: AC
  Administered 2012-03-08 (×6): 10 meq via INTRAVENOUS
  Filled 2012-03-08: qty 600

## 2012-03-08 MED ORDER — POTASSIUM CHLORIDE 10 MEQ/100ML IV SOLN
10.0000 meq | INTRAVENOUS | Status: AC
  Administered 2012-03-08 (×3): 10 meq via INTRAVENOUS
  Filled 2012-03-08: qty 300

## 2012-03-08 MED ORDER — FUROSEMIDE 10 MG/ML IJ SOLN
20.0000 mg | Freq: Once | INTRAMUSCULAR | Status: AC
  Administered 2012-03-08: 20 mg via INTRAVENOUS
  Filled 2012-03-08: qty 2

## 2012-03-08 MED ORDER — MAGNESIUM SULFATE 50 % IJ SOLN
2.0000 g | Freq: Once | INTRAVENOUS | Status: AC
  Administered 2012-03-08: 2 g via INTRAVENOUS
  Filled 2012-03-08: qty 4

## 2012-03-08 MED ORDER — FENTANYL CITRATE 0.05 MG/ML IJ SOLN
25.0000 ug | INTRAMUSCULAR | Status: DC | PRN
  Administered 2012-03-08: 25 ug via INTRAVENOUS
  Administered 2012-03-09 – 2012-03-10 (×6): 50 ug via INTRAVENOUS
  Administered 2012-03-10: 25 ug via INTRAVENOUS
  Administered 2012-03-10 – 2012-03-13 (×18): 50 ug via INTRAVENOUS
  Filled 2012-03-08 (×26): qty 2

## 2012-03-08 MED ORDER — PHENYLEPHRINE HCL 10 MG/ML IJ SOLN
30.0000 ug/min | INTRAVENOUS | Status: DC
  Administered 2012-03-08: 30 ug/min via INTRAVENOUS
  Filled 2012-03-08: qty 1

## 2012-03-08 MED ORDER — ONDANSETRON HCL 4 MG/2ML IJ SOLN
INTRAMUSCULAR | Status: AC
  Filled 2012-03-08: qty 2

## 2012-03-08 NOTE — Progress Notes (Signed)
eLink Physician-Brief Progress Note Patient Name: Vincent [name] DOB: 1932/05/17 MRN: [id number]  Date of Service  03/08/2012   HPI/Events of Note   Nausea and abdominal distention   eICU Interventions  Zofran ordered;       [facility] 03/08/2012, 8:06 PM

## 2012-03-08 NOTE — Progress Notes (Signed)
ANTICOAGULATION CONSULT NOTE  Pharmacy Consult for Coumadin Indication: Hx A.fib  No Known Allergies  Patient Measurements: Height: 5\' 11"  (180.3 cm) Weight: 194 lb 10.7 oz (88.3 kg) IBW/kg (Calculated) : 75.3  Heparin Dosing Weight: 88 kg  Vital Signs: Temp: 98.3 F (36.8 C) (11/05 0427) Temp src: Oral (11/05 0130) BP: 108/52 mmHg (11/05 0500) Pulse Rate: 36  (11/05 0500)  Labs:  Basename 03/08/12 0500 03/08/12 0030 03/07/12 1100 03/07/12 0403 03/06/12 0320 03/05/12 0847  HGB -- 7.6* -- 9.2* -- --  HCT -- 22.8* -- 27.2* 29.2* --  PLT -- 151 -- 179 213 --  APTT -- -- -- -- -- --  LABPROT 22.3* -- 19.5* -- -- 19.1*  INR 2.05* -- 1.71* -- -- 1.66*  HEPARINUNFRC -- -- -- -- -- --  CREATININE -- 0.97 -- 0.88 0.80 --  CKTOTAL -- -- -- -- -- --  CKMB -- -- -- -- -- --  TROPONINI -- -- -- -- -- --    Estimated Creatinine Clearance: 69 ml/min (by C-G formula based on Cr of 0.97).   Medical History: Past Medical History  Diagnosis Date  . Hypertension   . Arthritis   . Heart disease   . SOB (shortness of breath)   . Malignant neoplasm of hepatic flexure   . Malignant neoplasm of rectosigmoid junction   . Cancer     and/or colon polyp  . Malignant neoplasm of hepatic flexure   . Malignant neoplasm of rectosigmoid junction   . Diabetes mellitus   . Atrial fibrillation   . Colostomy care   . AAA (abdominal aortic aneurysm) without rupture 03/02/2012    Medications:  Prescriptions prior to admission  Medication Sig Dispense Refill  . albuterol-ipratropium (COMBIVENT) 18-103 MCG/ACT inhaler Inhale 1 puff into the lungs every 4 (four) hours as needed. WHEEZING      . aspirin EC 81 MG tablet Take 81 mg by mouth daily.      . digoxin (LANOXIN) 0.125 MG tablet Take 1 tablet (125 mcg total) by mouth daily.  30 tablet  0  . Fluticasone-Salmeterol (ADVAIR DISKUS) 500-50 MCG/DOSE AEPB Inhale 1 puff into the lungs every 12 (twelve) hours.  60 each  3  . HYDROmorphone  (DILAUDID) 4 MG tablet Take 4 mg by mouth every 6 (six) hours as needed. Pain      . metFORMIN (GLUCOPHAGE) 500 MG tablet Take 500 mg by mouth daily with breakfast.       . nitroGLYCERIN (NITROSTAT) 0.3 MG SL tablet Place 0.3 mg under the tongue every 5 (five) minutes as needed.      Marland Kitchen oxyCODONE (OXY IR/ROXICODONE) 5 MG immediate release tablet Take 5 mg by mouth every 6 (six) hours as needed. Breakthrough pain      . pantoprazole (PROTONIX) 40 MG tablet Take 1 tablet (40 mg total) by mouth daily at 12 noon.  30 tablet  0  . simvastatin (ZOCOR) 40 MG tablet Take 1 tablet (40 mg total) by mouth at bedtime.  30 tablet  0  . temazepam (RESTORIL) 15 MG capsule Take 15 mg by mouth at bedtime as needed.      . warfarin (COUMADIN) 5 MG tablet Take 0.5 tablets (2.5 mg total) by mouth daily. Pt takes 5 mg every day except on Monday and Friday pt takes 2.5mg  ( 1/2 tab of 5 mg tablet)  30 tablet  0  . zolpidem (AMBIEN) 5 MG tablet Take 1 tablet (5 mg total) by mouth at  bedtime as needed for sleep (insomnia).  30 tablet  0    Assessment:  78yo M admitted with SBO 10/29.  Underwent open surgery for SBO & LOA on 11/2.  On chronic Coumadin for hx A.fib, held since admission. Remains in A.fib.  SQ heparin for VTE prophylaxis was resumed 11/3. Last dose given 06:26 11/4.  Pharmacy asked to resuming anticoagulation 11/4.  INR up into therapeutic range now, rose significantly after 3mg .  Remains NPO except meds.  Hypotension overnight. Currently 108/52 on phenylephrine drip.  HR ranging 38-80.   H/H trending down but No bleeding reported/documented.  Goal of Therapy:  INR 2-3 Monitor platelets by anticoagulation protocol: Yes   Plan:   Stop SQ heparin.  No Coumadin today as INR likely to cont to rise.  F/u daily PT/INR & CBC.  Charolotte Eke, PharmD, pager [phone]. 03/08/2012,7:09 AM.

## 2012-03-08 NOTE — Progress Notes (Signed)
General Surgery [facility]  Agree with attached.  Patient seen and examined.  Abdomen soft, quiet, no output from ostomy yet.  Wound clear and dry.  Appreciate CCM consultation and assistance in management.  Velora Heckler, MD, [facility] Office: [phone]

## 2012-03-08 NOTE — Progress Notes (Signed)
eLink Physician-Brief Progress Note Patient Name: Vincent [name] DOB: 11-27-1932 MRN: [id number]  Date of Service  03/08/2012   HPI/Events of Note    Lab 03/08/12 0030 03/07/12 0403 03/06/12 0320 03/05/12 0508 03/04/12 0429  HGB 7.6* 9.2* 10.1* 9.3* 9.5*    Lab 03/08/12 0030 03/07/12 0403 03/06/12 0320 03/05/12 0508 03/04/12 0429  NA 131* 130* 127* 131* 132*  K 3.2* 3.7 -- -- --  CL 98 94* 92* 96 96  CO2 30 31 27 28 26   GLUCOSE 101* 126* 213* 208* 77  BUN 17 12 11 15 16   CREATININE 0.97 0.88 0.80 0.91 0.99  CALCIUM 8.1* 8.4 8.3* 8.7 8.8  MG 1.5 -- -- -- --  PHOS 3.1 -- -- -- --     eICU Interventions  1 Anemia of critical illness - no obvious bleed. Drop in hgb c/w anemia of critical illness and not bleed but continue to monitor. PRBC only for hgb <7gm% or if active bleeding  2. Low MAg and Low K - will replete   Intervention Category Intermediate Interventions: Electrolyte abnormality - evaluation and management  [name] 03/08/2012, 4:54 AM

## 2012-03-08 NOTE — Consult Note (Signed)
Name: Vincent [name] MRN: [id number] DOB: March 01, 1933    LOS: 7  REFERRING PRIVIDER:  Elisabeth Pigeon CHIEF COMPLAINT:  Hypotension    BRIEF PATIENT DESCRIPTION:  78 year old male w/ h/o colo-rectal CA, admotted on 10/29 w/ SBO. Failed conservative therapy. Had Lysis of adhesion on 11/2. PCCM asked to see on 11/5 for pressor dependent Hypotension.   LINES / TUBES:  CULTURES: MRSA screen 11/1: negative   ANTIBIOTICS:  SIGNIFICANT EVENTS:  11/2: Laparoscopy converted to open enterolysis of adhesions with oversew of enterotomy.   LEVEL OF CARE:  ICU PRIMARY SERVICE:  PCCM CONSULTANTS:  gen surg CODE STATUS: DNR DIET:  NPO DVT Px:  Coumadin  GI Px:  NONE  HISTORY OF PRESENT ILLNESS:   Vincent [name] is a 78 year old white male who was diagnosed with a colon/rectal cancer in 2011. He has undergone a colon resection with end colostomy by Dr. Gerrit Friends in October 2011. He went through treatment with FOLFOX, but then has had evidence of recurrence. He was started on FOLFIRI by Dr. Clelia Croft. His last treatment was around 10/20. He developed a bowel obstruction for which he was admitted on 10/29. There was an attempt to treat this with medical treatment, but he continued to have persistence of pain and evidence of a small bowel obstruction. He went to the OR on 11/2: had open enterolysis of adhesion. Was sent to the SDU for recovery. Developed hypotension on 11/4, for which he received several boluses of NS, but SBP never improved past 80s-90s. Because of this he was placed on Neo gtt. PCCM asked to see on 11/5 for hypotension.   PAST MEDICAL HISTORY :  Past Medical History  Diagnosis Date  . Hypertension   . Arthritis   . Heart disease   . SOB (shortness of breath)   . Malignant neoplasm of hepatic flexure   . Malignant neoplasm of rectosigmoid junction   . Cancer     and/or colon polyp  . Malignant neoplasm of hepatic flexure   . Malignant neoplasm of rectosigmoid junction   . Diabetes  mellitus   . Atrial fibrillation   . Colostomy care   . AAA (abdominal aortic aneurysm) without rupture 03/02/2012   Past Surgical History  Procedure Date  . Back surgery [id number]  . Arthroscopic knee 1992  . Adenocarcinoma of the rectum [id number]  . Exploratory laparotomy 02/2010  . Rectosigmoid colon resection 02/2010  . Colostomy 02/2010  . Drainage of intra-abdominal abscess 02/2010  . Flexible sigmoidoscopy 12/18/2011    Procedure: FLEXIBLE SIGMOIDOSCOPY;  Surgeon: Beverley Fiedler, MD;  Location: [facility];  Service: Gastroenterology;  Laterality: N/A;  . Colon surgery   . Cosmetic surgery   . Laparotomy 03/05/2012    Procedure: EXPLORATORY LAPAROTOMY;  Surgeon: Lodema Pilot, DO;  Location: [facility];  Service: General;  Laterality: N/A;  . Laparoscopic lysis of adhesions 03/05/2012    Procedure: LAPAROSCOPIC LYSIS OF ADHESIONS;  Surgeon: Lodema Pilot, DO;  Location: [facility];  Service: General;  Laterality: N/A;   Prior to Admission medications   Medication Sig Start Date End Date Taking? Authorizing Provider  albuterol-ipratropium (COMBIVENT) 18-103 MCG/ACT inhaler Inhale 1 puff into the lungs every 4 (four) hours as needed. WHEEZING 12/28/11  Yes Alison Murray, MD  aspirin EC 81 MG tablet Take 81 mg by mouth daily. 01/16/12 01/15/13 Yes Laveda Norman, MD  digoxin (LANOXIN) 0.125 MG tablet Take 1 tablet (125 mcg total) by mouth daily. 12/28/11  Yes Alma M  Elisabeth Pigeon, MD  Fluticasone-Salmeterol (ADVAIR DISKUS) 500-50 MCG/DOSE AEPB Inhale 1 puff into the lungs every 12 (twelve) hours. 12/28/11  Yes Alison Murray, MD  HYDROmorphone (DILAUDID) 4 MG tablet Take 4 mg by mouth every 6 (six) hours as needed. Pain 12/28/11  Yes Historical Provider, MD  metFORMIN (GLUCOPHAGE) 500 MG tablet Take 500 mg by mouth daily with breakfast.  11/12/11  Yes Historical Provider, MD  nitroGLYCERIN (NITROSTAT) 0.3 MG SL tablet Place 0.3 mg under the tongue every 5 (five) minutes as needed. 01/16/12 01/15/13 Yes Laveda Norman,  MD  oxyCODONE (OXY IR/ROXICODONE) 5 MG immediate release tablet Take 5 mg by mouth every 6 (six) hours as needed. Breakthrough pain 12/28/11  Yes Historical Provider, MD  pantoprazole (PROTONIX) 40 MG tablet Take 1 tablet (40 mg total) by mouth daily at 12 noon. 12/28/11 12/27/12 Yes Alison Murray, MD  simvastatin (ZOCOR) 40 MG tablet Take 1 tablet (40 mg total) by mouth at bedtime. 12/28/11  Yes Alison Murray, MD  temazepam (RESTORIL) 15 MG capsule Take 15 mg by mouth at bedtime as needed. 02/10/12  Yes Benjiman Core, MD  warfarin (COUMADIN) 5 MG tablet Take 0.5 tablets (2.5 mg total) by mouth daily. Pt takes 5 mg every day except on Monday and Friday pt takes 2.5mg  ( 1/2 tab of 5 mg tablet) 12/28/11  Yes Alison Murray, MD  zolpidem (AMBIEN) 5 MG tablet Take 1 tablet (5 mg total) by mouth at bedtime as needed for sleep (insomnia). 12/28/11 12/27/12 Yes Alison Murray, MD   No Known Allergies  FAMILY HISTORY:  Family History  Problem Relation Age of Onset  . Cancer Father    SOCIAL HISTORY:  reports that he quit smoking about 3 years ago. His smoking use included Cigarettes. He has a 120 pack-year smoking history. He has never used smokeless tobacco. He reports that he does not drink alcohol. His drug history not on file.  REVIEW OF SYSTEMS:   Unable, d/t narcotic effect   INTERVAL HISTORY:  Remains of pressors.   VITAL SIGNS: Temp:  [97.2 F (36.2 C)-98.4 F (36.9 C)] 98.3 F (36.8 C) (11/05 0427) Pulse Rate:  [36-102] 36  (11/05 0500) Resp:  [15-28] 22  (11/05 0500) BP: (76-115)/(35-84) 108/52 mmHg (11/05 0500) SpO2:  [93 %-100 %] 99 % (11/05 0500) Weight:  [88.3 kg (194 lb 10.7 oz)] 88.3 kg (194 lb 10.7 oz) (11/05 0619) 3 liters  PHYSICAL EXAMINATION: General:  Sleepy, no distress, c/o marked pain  Neuro:  No focal def  HEENT:  [place], no JVD, neck veins flat Cardiovascular:  Regular irregular  Lungs:  Clear, no accessory muscle use  Abdomen:  Staples intact, slightly erythremic,  generalized pain to gentle palp Musculoskeletal:  intact Skin:  Dry, intact   DIAGNOSES: Principal Problem:  *SBO (small bowel obstruction) Active Problems:  Rectal carcinoma  Abdominal pain  PAF (paroxysmal atrial fibrillation)  HTN (hypertension)  Nausea & vomiting  AAA (abdominal aortic aneurysm) without rupture   ASSESSMENT / PLAN: PULMONARY No results found for this basename: PHART:3,PCO2ART:3,PO2ART:3,HCO3:3,O2SAT:3 in the last 168 hours  Ventilator Settings:   CXR:  RLL atx vs infiltrate  A: RLL atx vs infiltrate In light of elevated PCT concerned aboout HCAP. No other symptoms that would support this clinically.  P:   pulm hygiene Low threshold to add abx IF spikes fever, WBC climb or PCT rises.   CARDIOVASCULAR  Lab 03/08/12 0030  LATICACIDVEN 0.7  O2SATVEN --  PROBNP  2257.0*   ECG:  AF   A:  CAF Currently w/ CVR Hypotension:  Currently renal fxn and lactate suggest intact end-organ perfusion. Suspect that this is a mix of narcotics and hypovolemia (even though he has received several IV boluses). Given his nutritional status he could certainly still be intravascularly dry. Infection/Sepsis on diff dx, as is acute cardiac event.  P:  Check CEs Check cortisol Central access to assess CVP Cont pressors for MAP goal >65 No further diuretics  RENAL  Lab 03/08/12 0030 03/07/12 0403 03/06/12 0320  NA 131* 130* 127*  K 3.2* 3.7 4.5  CL 98 94* 92*  CO2 30 31 27   BUN 17 12 11   CREATININE 0.97 0.88 0.80  CALCIUM 8.1* 8.4 8.3*  MG 1.5 -- --  PHOS 3.1 -- --   Intake/Output      11/04 0701 - 11/05 0700 11/05 0701 - 11/06 0700   I.V. (mL/kg) 1392.3 (15.8)    IV Piggyback 1070    Total Intake(mL/kg) 2462.3 (27.9)    Urine (mL/kg/hr) 1090 (0.5)    Emesis/NG output     Total Output 1090    Net +1372.3           A:   Hyponatremia Hypokalemia  P:   Cont IVFs Replace K Recheck chemistry   GASTROINTESTINAL  Lab 03/08/12 0030  AST 8  ALT 6    ALKPHOS 58  PROT 4.5*  ALBUMIN 1.8*    A:   H/o Colo-rectal cancer w/ end-colostomy (2011) , s/p SBO, requiring lysis of adhesions on 11/2 Protein cal malnutrition  P:   Diet and wound care per general surg  HEMATOLOGIC  Lab 03/08/12 0500 03/08/12 0030 03/07/12 1100 03/07/12 0403 03/06/12 0320 03/05/12 0847  HGB -- 7.6* -- 9.2* 10.1* --  HCT -- 22.8* -- 27.2* 29.2* --  PLT -- 151 -- 179 213 --  INR 2.05* -- 1.71* -- -- 1.66*  APTT -- -- -- -- -- --    A:   Anemia  hgb drift>>>no current evidence of bleeding. Could reflect hemodilution s/p several IVF bolus.  Colo-rectal CA, evidence of reccurrence, now on Folfiri, last rx ~10/20.  P:  For now will cont Warfarin, but will recheck CBC again, if cont to drop will be forced to stop Check SCVO2, if < 70 will transfuse, given pressor dependent status  INFECTIOUS  Lab 03/08/12 0030 03/07/12 0403 03/06/12 0320 03/05/12 0508  PROCALCITON 4.11 -- -- --  WBC 4.0 7.5 7.0 2.6*  LATICACIDVEN 0.7 -- -- --    A: ? Infection, does have moderate non-specific PCT rise.  P:   Repeat PCT, send BC. Will hold off on abx at this point >> low threshold to start if no improvement in BP or if SIRS To early for abscess and low risk  ENDOCRINE CBG  Lab 03/08/12 0658 03/08/12 0114 03/07/12 1839 03/07/12 1744 03/07/12 1132  GLUCAP 111* 98 111* 119* 120*    A:  DM W/ excellent control currently  P:   Monitor CBGs    NEUROLOGIC A: post-op pain, sedation P: PRN analgesia   SUMMARY:  78 year old male w/ h/o colo-rectal CA, admotted on 10/29 w/ SBO. Failed conservative therapy. Had Lysis of adhesion on 11/2. PCCM asked to see on 11/5 for pressor dependent Hypotension.  Currently renal fxn and lactate suggest intact end-organ perfusion. Suspect that this is a mix of narcotics and hypovolemia (even though he has received several IV boluses). Given his nutritional  status he could certainly still be intravascularly dry. Infection/Sepsis on  diff dx, as is acute cardiac event. Will place CVL, check cortisol, r/o MI, and repeat PCT. For now will hold off from abx.    I have personally obtained a history, examined the patient, evaluated laboratory and imaging results, formulated the assessment and plan and placed orders. CRITICAL CARE: The patient is critically ill with multiple organ systems failure and requires high complexity decision making for assessment and support, frequent evaluation and titration of therapies, application of advanced monitoring technologies and extensive interpretation of multiple databases. Critical Care Time devoted to patient care services described in this note is 60 minutes.    Pulmonary and Critical Care Medicine [facility] Pager: [phone]  03/08/2012, 8:23 AM

## 2012-03-08 NOTE — Progress Notes (Signed)
3 Days Post-Op  Subjective: Says he feels better, but don't push on his stomach.  Tired and sleeping when i came in.  Wakes up easily.  Objective: Vital signs in last 24 hours: Temp:  [97.2 F (36.2 C)-98.4 F (36.9 C)] 98 F (36.7 C) (11/05 0800) Pulse Rate:  [36-102] 36  (11/05 0500) Resp:  [15-28] 22  (11/05 0500) BP: (76-115)/(35-84) 108/52 mmHg (11/05 0500) SpO2:  [93 %-100 %] 100 % (11/05 0834) Weight:  [194 lb 10.7 oz (88.3 kg)] 194 lb 10.7 oz (88.3 kg) (11/05 0619)  Npo, fluid balance:  +6.6 liters since hospitalization. Hypotensive on phenylephrine drip since MN. K+ is low, BNP  2257, H/H is down. INR is up to 2.05, pre albumin is 7.0. No recent films.    Intake/Output from previous day: 11/04 0701 - 11/05 0700 In: 2462.3 [I.V.:1392.3; IV Piggyback:1070] Out: 1090 [Urine:1090] Intake/Output this shift:    General appearance: sleeping but wakes up easily, less agitatied than yesterday. Resp: clear to auscultation bilaterally and anterior exam GI: not distineded, suture line OK some minimal redness, no bowel sounds, nothing in colostomy bag, no stool or gas.  Lab Results:   [facility] 03/08/12 0030 03/07/12 0403  WBC 4.0 7.5  HGB 7.6* 9.2*  HCT 22.8* 27.2*  PLT 151 179    BMET  Basename 03/08/12 0030 03/07/12 0403  NA 131* 130*  K 3.2* 3.7  CL 98 94*  CO2 30 31  GLUCOSE 101* 126*  BUN 17 12  CREATININE 0.97 0.88  CALCIUM 8.1* 8.4   PT/INR  Basename 03/08/12 0500 03/07/12 1100  LABPROT 22.3* 19.5*  INR 2.05* 1.71*     Lab 03/08/12 0030  AST 8  ALT 6  ALKPHOS 58  BILITOT 0.4  PROT 4.5*  ALBUMIN 1.8*     Lipase     Component Value Date/Time   LIPASE 7* 03/08/2012 0030     Studies/Results: No results found.  Medications:    . acetaminophen  1,000 mg Intravenous Q6H  . ipratropium  0.5 mg Nebulization QID   And  . albuterol  2.5 mg Nebulization QID  . antiseptic oral rinse  15 mL Mouth Rinse q12n4p  . chlorhexidine  15 mL Mouth Rinse  BID  . [COMPLETED] furosemide  20 mg Intravenous Once  . insulin aspart  0-15 Units Subcutaneous Q6H  . magnesium sulfate LVP 250-500 ml  2 g Intravenous Once  . pantoprazole (PROTONIX) IV  40 mg Intravenous Q24H  . [COMPLETED] potassium chloride  10 mEq Intravenous Q1 Hr x 3  . potassium chloride  10 mEq Intravenous Q1 Hr x 6  . [COMPLETED] sodium chloride  250 mL Intravenous Once  . [COMPLETED] sodium chloride  250 mL Intravenous Once  . sodium chloride  500 mL Intravenous Once  . [COMPLETED] warfarin  3 mg Oral Once  . Warfarin - Pharmacist Dosing Inpatient   Does not apply q1800  . [DISCONTINUED] heparin subcutaneous  5,000 Units Subcutaneous Q8H  . [DISCONTINUED] heparin subcutaneous  5,000 Units Subcutaneous Q8H  . [DISCONTINUED] morphine   Intravenous Q4H  .    . sodium chloride Stopped (03/08/12 0535)  . phenylephrine (NEO-SYNEPHRINE) Adult infusion 45 mcg/min (03/08/12 0400)  . [DISCONTINUED] diltiazem (CARDIZEM) infusion Stopped (03/07/12 1939)  . [DISCONTINUED] phenylephrine (NEO-SYNEPHRINE) Adult infusion 30 mcg/min (03/08/12 0039)    Assessment/Plan EXPLORATORY LAPAROTOMY, LAPAROSCOPIC LYSIS OF ADHESIONS - D. Newman - 03/05/2012  For SBO (small bowel obstruction) secondary to adhesive band - no obvious intra-peritoneal cancer.  Ileus - cont NGT  S/P rectosigmoid resection with radiation and  chemotherapy, for salvage Dr. Clelia Croft.  Advanced Rectal carcinoma T4N2 - Dr. Earl Gala oncologist, Primary resection by Dr. Nat Christen - 02/13/2010  PAF (paroxysmal atrial fibrillation) On chronic coumadin/DR. Allyson Sabal. Heparin is SQ and medicine restarted coumadin 03/07/12. HTN  AAA  DVT proph - SQ hep  Sleep Apnea,  Tobacco use 60 years   Plan:  On neo at 27mcg/min, brady, no acute distress, critical care consult pending. I will recheck labs now.  Follow with medicine. INR 2.05  LOS: 7 days    Vincent [name] 03/08/2012

## 2012-03-08 NOTE — Progress Notes (Signed)
Event: 0010:  Several communications w/ RN regarding persistent hypotension that has not responded to NS boluses. SBP 80-90's w/ MAP's <60 since approx 1900. Pt is DNR. Discussed pt w/ Dr Toniann Fail who feels we should consult with critical care service. CCM consulted via E-Link by nursing when pt's SBP <70. Dr Marchelle Gearing placed pt on Neo-synephrine drip and ordered several labs.  Subjective: Pt reports c/o post-op abd pain. Denies CP, SOB or other c/o. Objective: Pt is 78 y/o elderly gentleman w/ h/o rectal cancer admitted 03/02/2012 for SBO. Had exploratory lap on 03/05/12 for lysis of adhesions and was placed in SDU postoperatively.  Pt has been on neo-synephrine for several hours now and BP has improved. Current VS, BP-119/57, T-98.3, P-90 (though bradycardic at times into the 50's), R-22 w/ 02 sats of 97-99% on 2L [place]. K+ 3.2 and Mag 1.5 (both repleted). BBS w/ coarse rhonchi bil. Abd noted w/ mild to mod TTP. Staples to midline incision CDI, no output from stoma, + BS. Assessment/Plan: 1. Persistent hypotension:  (Improved on neo-synephrine)  2. A-Fib: Rate controlled but with episodes of significant bradycardia in 50's. Asymptomatic. Spoke w/ Dr Marchelle Gearing who recommends continuing Neo-synephrine drip for now and obtaining full critical care consult this am to decide how aggressive to manage pt given DNR status and cancer prognosis. 3. Hypomagnesium/Hypokalemia:  Repleted per CCM 4. Post-op pain: Continue IV Dilaudid as BP tolerates.  Natalia Leatherwood P.Jermone Geister, NP-C [employer]  Pager [phone]

## 2012-03-08 NOTE — Procedures (Signed)
Central Venous Catheter Insertion Procedure Note Vincent [name] [id number] 1932/12/28  Procedure: Insertion of Central Venous Catheter Indications: Assessment of intravascular volume, Drug and/or fluid administration and Frequent blood sampling  Procedure Details Consent: Risks of procedure as well as the alternatives and risks of each were explained to the (patient/caregiver).  Consent for procedure obtained. Time Out: Verified patient identification, verified procedure, site/side was marked, verified correct patient position, special equipment/implants available, medications/allergies/relevent history reviewed, required imaging and test results available.  Performed  Maximum sterile technique was used including antiseptics, cap, gloves, gown, hand hygiene, mask and sheet. Skin prep: Chlorhexidine; local anesthetic administered A antimicrobial bonded/coated triple lumen catheter was placed in the right subclavian vein using the Seldinger technique.  Evaluation Blood flow good Complications: No apparent complications Patient did tolerate procedure well. Chest X-ray ordered to verify placement.  CXR: pending.  BABCOCK,PETE 03/08/2012, 11:22 AM  Levy Pupa, MD, PhD 03/08/2012, 11:38 AM [facility] [phone] or if no answer [phone]

## 2012-03-08 NOTE — Progress Notes (Signed)
Dr. Johna Sheriff notified of abd distention, increased pain, and ileus results from K[facility]. No new orders at this time. Will continue to monitor.

## 2012-03-08 NOTE — Progress Notes (Signed)
eLink Physician-Brief Progress Note Patient Name: Vincent [name] DOB: 01-Jul-1932 MRN: [id number]  Date of Service  03/08/2012   HPI/Events of Note   PERSISTENT HYPOTENSION Per RN, patient is full medical care but DNAR  eICU Interventions  Stat labs Start neo   Intervention Category Major Interventions: Hypotension - evaluation and management  Crystalynn Mcinerney 03/08/2012, 12:14 AM

## 2012-03-08 NOTE — Progress Notes (Signed)
eLink Physician-Brief Progress Note Patient Name: ATREYU MAK DOB: [birth year] MRN: [id number]  Date of Service  03/08/2012   HPI/Events of Note  Patient now with MAP > 60 on neo but HR dipping into 50s with A Fib   eICU Interventions  Continue to monitor closely Will do dopamine if HR gets into 40s or 30s   Intervention Category Intermediate Interventions: Other:  Yari Szeliga 03/08/2012, 1:10 AM

## 2012-03-08 NOTE — Progress Notes (Signed)
78 year old male w/ h/o colo-rectal CA, admitted on 10/29 w/ SBO initially for medical therapy. OR on 11/2 for lysis of adhesions. Transferred to ICU and PCCM consulted for hypotension, remains on neosynephrine drip a 45 mcg/hr, received boluses of K+ for 3.2 potassium level. Remains alert/confused, AFib on monitor, n/c O2.

## 2012-03-09 ENCOUNTER — Other Ambulatory Visit: Payer: Medicare Other | Admitting: Lab

## 2012-03-09 ENCOUNTER — Ambulatory Visit: Payer: Medicare Other | Admitting: Oncology

## 2012-03-09 ENCOUNTER — Inpatient Hospital Stay: Payer: Medicare Other

## 2012-03-09 ENCOUNTER — Encounter: Payer: Medicare Other | Admitting: Nutrition

## 2012-03-09 LAB — BASIC METABOLIC PANEL
Calcium: 8.3 mg/dL — ABNORMAL LOW (ref 8.4–10.5)
GFR calc Af Amer: >90 mL/min (ref >90)
GFR calc non Af Amer: 81 mL/min — ABNORMAL LOW (ref >90)
Glucose, Bld: 117 mg/dL — ABNORMAL HIGH (ref 70–99)
Potassium: 3.3 mEq/L — ABNORMAL LOW (ref 3.5–5.1)
Sodium: 132 mEq/L — ABNORMAL LOW (ref 135–145)

## 2012-03-09 LAB — PROTIME-INR
INR: 2.26 — ABNORMAL HIGH (ref 0.00–1.49)
Prothrombin Time: 24 seconds — ABNORMAL HIGH (ref 11.6–15.2)

## 2012-03-09 LAB — GLUCOSE, CAPILLARY
Glucose-Capillary: 106 mg/dL — ABNORMAL HIGH (ref 70–99)
Glucose-Capillary: 146 mg/dL — ABNORMAL HIGH (ref 70–99)

## 2012-03-09 LAB — CBC WITH DIFFERENTIAL/PLATELET
Basophils Relative: 0 % (ref 0–1)
Eosinophils Absolute: 0 10*3/uL (ref 0.0–0.7)
Hemoglobin: 8.5 g/dL — ABNORMAL LOW (ref 13.0–17.0)
Lymphs Abs: 0.2 10*3/uL — ABNORMAL LOW (ref 0.7–4.0)
MCH: 30.8 pg (ref 26.0–34.0)
Monocytes Relative: 22 % — ABNORMAL HIGH (ref 3–12)
Neutro Abs: 2.2 10*3/uL (ref 1.7–7.7)
Neutrophils Relative %: 69 % (ref 43–77)
Platelets: 177 10*3/uL (ref 150–400)
RBC: 2.76 MIL/uL — ABNORMAL LOW (ref 4.22–5.81)

## 2012-03-09 LAB — PROCALCITONIN: Procalcitonin: 1.64 ng/mL

## 2012-03-09 MED ORDER — DIGOXIN 125 MCG PO TABS
0.1250 mg | ORAL_TABLET | Freq: Every day | ORAL | Status: DC
  Administered 2012-03-09 – 2012-03-14 (×6): 0.125 mg via ORAL
  Filled 2012-03-09 (×6): qty 1

## 2012-03-09 MED ORDER — HEPARIN SOD (PORK) LOCK FLUSH 100 UNIT/ML IV SOLN
500.0000 [IU] | INTRAVENOUS | Status: DC
  Filled 2012-03-09: qty 5

## 2012-03-09 MED ORDER — WARFARIN SODIUM 1 MG PO TABS
1.0000 mg | ORAL_TABLET | Freq: Once | ORAL | Status: DC
  Filled 2012-03-09: qty 1

## 2012-03-09 MED ORDER — HEPARIN SOD (PORK) LOCK FLUSH 100 UNIT/ML IV SOLN
500.0000 [IU] | INTRAVENOUS | Status: DC | PRN
  Administered 2012-03-09: 500 [IU]
  Filled 2012-03-09: qty 5

## 2012-03-09 MED ORDER — METOPROLOL TARTRATE 1 MG/ML IV SOLN
2.5000 mg | Freq: Four times a day (QID) | INTRAVENOUS | Status: DC | PRN
  Administered 2012-03-09: 2.5 mg via INTRAVENOUS
  Filled 2012-03-09 (×2): qty 5

## 2012-03-09 MED ORDER — POTASSIUM CHLORIDE CRYS ER 20 MEQ PO TBCR
40.0000 meq | EXTENDED_RELEASE_TABLET | ORAL | Status: AC
  Administered 2012-03-09 (×2): 40 meq via ORAL
  Filled 2012-03-09 (×2): qty 2

## 2012-03-09 NOTE — Clinical Social Work Psychosocial (Signed)
Clinical Social Work Department BRIEF PSYCHOSOCIAL ASSESSMENT 03/09/2012  Patient:  Vincent [name], Vincent [name]     Account Number:  [id number]     Admit date:  03/01/2012  Clinical Social Worker:  Jodelle Red  Date/Time:  03/09/2012 12:02 PM  Referred by:  CSW  Date Referred:  03/08/2012 Referred for  SNF Placement   Other Referral:   Interview type:  Patient Other interview type:   FAMILY-WIFE, RN AND PT    PSYCHOSOCIAL DATA Living Status:  WIFE Admitted from facility:   Level of care:   Primary support name:  Vincent [name] Primary support relationship to patient:  SPOUSE Degree of support available:   VERY GOOD, HAS WIFE AND 20 YO GRANDSON AT HOME TO HELP    CURRENT CONCERNS Current Concerns  Adjustment to Illness   Other Concerns:   REFUSES SNF    SOCIAL WORK ASSESSMENT / PLAN CSW MET WITH WIFE AND PT TO DISCUSS POSSIBLE NEED FOR SNF PER PT OR AT LEAST 24 HR CARE AT HOME. Pt refuses SNF currently. He plans for his extended family to assist him at home. Pt aware he will need to progress with PT prior to d/c for this to occur.  CSW explained we would need to get insurance auth and would need some time to get SNF lined up if it is needed.   Assessment/plan status:  Psychosocial Support/Ongoing Assessment of Needs Other assessment/ plan:   follow/revisit need for SNF.   Information/referral to community resources:    PATIENT'S/FAMILY'S RESPONSE TO PLAN OF CARE: Pt refusing SNF, but agrees to CSW discussing again in the am. Wife is flexible on this plan and is fine with SNF if it is recommended.    Doreen Salvage, LCSW ICU/Stepdown Clinical Social Worker [facility] Cell [phone] Hours 8am-1200pm M-F

## 2012-03-09 NOTE — Progress Notes (Addendum)
Physical Therapy Treatment Patient Details Name: Vincent [name] MRN: [id number] DOB: March 11, 1933 Today's Date: 03/09/2012 Time: 0950-1015 PT Time Calculation (min): 25 min  PT Assessment / Plan / Recommendation Comments on Treatment Session  pt much clearer today; Ox4; activity limited by increased HR/therapist limiting activity; may need SNF if continued slow progress    Follow Up Recommendations  Home health PT;Supervision - Intermittent;Other (comment) (vs. SNF if slow progress)     Does the patient have the potential to tolerate intense rehabilitation     Barriers to Discharge        Equipment Recommendations  None recommended by PT    Recommendations for Other Services    Frequency Min 3X/week   Plan Discharge plan remains appropriate;Frequency remains appropriate    Precautions / Restrictions Precautions Precautions: Fall;Other (comment) Precaution Comments: abd incision Restrictions Weight Bearing Restrictions: No   Pertinent Vitals/Pain  BP 108/64 Sats 95% on RA HR 115-142 RR 22-26   Mobility  Bed Mobility Bed Mobility: Supine to Sit Supine to Sit: 1: +2 Total assist;HOB elevated Supine to Sit: Patient Percentage: 70% Details for Bed Mobility Assistance: +2 lines, safety, trunk and LE assist; multi-modal cues for technique Transfers Transfers: Sit to Stand;Stand to Sit;Stand Pivot Transfers Sit to Stand: 1: +2 Total assist;From bed Sit to Stand: Patient Percentage: 80% Stand to Sit: 1: +2 Total assist;To chair/3-in-1 Stand to Sit: Patient Percentage: 80% Stand Pivot Transfers: 1: +2 Total assist Stand Pivot Transfers: Patient Percentage: 80% Details for Transfer Assistance: +2 lines and safety,  multi-modal cues for RW safety, posture Ambulation/Gait Ambulation/Gait Assistance: 1: +2 Total assist Ambulation/Gait: Patient Percentage: 70% Ambulation Distance (Feet): 7 Feet Assistive device: Rolling walker Ambulation/Gait Assistance Details: cues for RW  safety, posture; +2 for lines and safety Gait Pattern: Step-to pattern;Trunk flexed    Exercises Total Joint Exercises Ankle Circles/Pumps: AROM;20 reps;Both Quad Sets: AROM;10 reps;Both Heel Slides: AROM;Both;10 reps   PT Diagnosis:    PT Problem List:   PT Treatment Interventions:     PT Goals Acute Rehab PT Goals Time For Goal Achievement: 03/21/12 Potential to Achieve Goals: Good Pt will go Supine/Side to Sit: with supervision PT Goal: Supine/Side to Sit - Progress: Progressing toward goal Pt will go Sit to Stand: with supervision PT Goal: Sit to Stand - Progress: Progressing toward goal Pt will go Stand to Sit: with supervision PT Goal: Stand to Sit - Progress: Progressing toward goal Pt will Ambulate: 51 - 150 feet;with supervision;with least restrictive assistive device PT Goal: Ambulate - Progress: Progressing toward goal  Visit Information  Last PT Received On: 03/09/12 Assistance Needed: +2    Subjective Data  Subjective: this is [facility]  Patient Stated Goal: to get out of here   Cognition  Overall Cognitive Status: Appears within functional limits for tasks assessed/performed Arousal/Alertness: Awake/alert Orientation Level: Oriented X4 / Intact Behavior During Session: [facility] for tasks performed Following Commands: Follows one step commands consistently    Pension scheme manager Standing - Balance Support: Bilateral upper extremity supported;During functional activity Static Standing - Level of Assistance: 4: Min assist;3: Mod assist  End of Session PT - End of Session Activity Tolerance: Patient limited by fatigue;Treatment limited secondary to medical complications (Comment);Patient tolerated treatment well Patient left: in chair;with call bell/phone within reach Nurse Communication: Mobility status   GP     [facility] 03/09/2012, 10:36 AM

## 2012-03-09 NOTE — Progress Notes (Signed)
Name: Vincent [name] MRN: [id number] DOB: 1932/06/05    LOS: 8  REFERRING PRIVIDER:  Elisabeth Pigeon CHIEF COMPLAINT:  Hypotension    BRIEF PATIENT DESCRIPTION:  78 year old male w/ h/o colo-rectal CA, admotted on 10/29 w/ SBO. Failed conservative therapy. Had Lysis of adhesion on 11/2. PCCM asked to see on 11/5 for pressor dependent Hypotension.   LINES / TUBES:  CULTURES: MRSA screen 11/1: negative   ANTIBIOTICS:  SIGNIFICANT EVENTS:  11/2: Laparoscopy converted to open enterolysis of adhesions with oversew of enterotomy. 11/5: cortisol: 8  LEVEL OF CARE:  ICU PRIMARY SERVICE:  PCCM CONSULTANTS:  gen surg CODE STATUS: DNR DIET:  NPO DVT Px:  Coumadin  GI Px:  NONE   INTERVAL HISTORY/subjective :   off pressors. Colostomy draining. Multiple complaints, but non-specific   VITAL SIGNS: Temp:  [97.4 F (36.3 C)-98 F (36.7 C)] 97.4 F (36.3 C) (11/06 0800) Pulse Rate:  [51-119] 92  (11/06 0800) Resp:  [15-25] 22  (11/06 0800) BP: (95-156)/(51-71) 108/56 mmHg (11/06 0800) SpO2:  [93 %-100 %] 100 % (11/06 0800) CVP:  [7 mmHg-17 mmHg] 7 mmHgRoom air     . sodium chloride 125 mL/hr at 03/08/12 1206  . phenylephrine (NEO-SYNEPHRINE) Adult infusion Stopped (03/08/12 1948)  . [DISCONTINUED] diltiazem (CARDIZEM) infusion Stopped (03/07/12 1939)    PHYSICAL EXAMINATION: General:  Sleepy, no distress, c/o marked pain  Neuro:  No focal def  HEENT:  , no JVD, neck veins flat Cardiovascular:  Regular irregular  Lungs: scattered rhonchi that clear w/ cough  Abdomen:  Staples intact, slightly erythremic, generalized pain to gentle palp Musculoskeletal:  intact Skin:  Dry, intact   DIAGNOSES: Principal Problem:  *SBO (small bowel obstruction) Active Problems:  Rectal carcinoma  Abdominal pain  PAF (paroxysmal atrial fibrillation)  HTN (hypertension)  Nausea & vomiting  AAA (abdominal aortic aneurysm) without rupture  Hypotension   ASSESSMENT /  PLAN: PULMONARY  Lab 03/08/12 1150  PHART --  PCO2ART --  PO2ART --  HCO3 --  O2SAT 67.4    Ventilator Settings:   CXR:  RLL atx vs infiltrate  A: RLL atx vs infiltrate PCT negative. Favor ATX over PNA P:   pulm hygiene Low threshold to add abx IF spikes fever, WBC climb or PCT rises.   CARDIOVASCULAR  Lab 03/08/12 0030  LATICACIDVEN 0.7  O2SATVEN --  PROBNP 2257.0*    Lab 03/08/12 1800 03/08/12 1555 03/08/12 1300  CKTOTAL -- -- --  CKMB -- -- --  TROPONINI <0.30 <0.30 <0.30  ECG:  AF   A:  CAF Currently w/ CVR Hypotension (resolved):  Currently renal fxn and lactate suggest intact end-organ perfusion. Suspect that this was a mix of narcotics and hypovolemia (even though he has received several IV boluses).CEs are negative. Adrenal assessment suggests relative adrenal dysfxn, but given the fact that he is off pressors will hold off from stress dosing P:  Keep euvolemic at this point (careful I&O to keep up w/ ostomy output) Goal SBP > 100 Could run a course of stress dose steroids if hypotensive again.   RENAL  Lab 03/09/12 0541 03/08/12 2049 03/08/12 1030 03/08/12 0030  NA 132* -- 130* 131*  K 3.3* 3.7 3.6 --  CL 98 -- 95* 98  CO2 26 -- 29 30  BUN 13 -- 15 17  CREATININE 0.88 -- 0.86 0.97  CALCIUM 8.3* -- 8.2* 8.1*  MG -- -- -- 1.5  PHOS -- -- -- 3.1   Intake/Output  11/05 0701 - 11/06 0700 11/06 0701 - 11/07 0700   I.V. (mL/kg) 3015.4 (34.1)    IV Piggyback 700    Total Intake(mL/kg) 3715.4 (42.1)    Urine (mL/kg/hr) 3485 (1.6)    Stool 1700    Total Output 5185    Net -1469.6           A:   Hyponatremia Hypokalemia  P:   Cont IVFs Replace K Recheck chemistry in am   GASTROINTESTINAL  Lab 03/08/12 0030  AST 8  ALT 6  ALKPHOS 58  PROT 4.5*  ALBUMIN 1.8*    A:   H/o Colo-rectal cancer w/ end-colostomy (2011) , s/p SBO, requiring lysis of adhesions on 11/2 Protein cal malnutrition  P:   Diet and wound care per general  surg  HEMATOLOGIC  Lab 03/09/12 0541 03/08/12 1030 03/08/12 0500 03/08/12 0030 03/07/12 1100  HGB 8.5* 8.3* -- 7.6* --  HCT 25.2* 24.7* -- 22.8* --  PLT 177 199 -- 151 --  INR 2.26* -- 2.05* -- 1.71*  APTT -- -- -- -- --    A:   Anemia  hgb drift>>>no current evidence of bleeding. Could reflect hemodilution s/p several IVF bolus.  Colo-rectal CA, evidence of reccurrence, now on Folfiri, last rx ~10/20.  P:  Cont warfarin Trend CBC Cont PPi   INFECTIOUS  Lab 03/09/12 0542 03/09/12 0541 03/08/12 1030 03/08/12 0030 03/07/12 0403  PROCALCITON 1.64 -- -- 4.11 --  WBC -- 3.2* 6.5 4.0 7.5  LATICACIDVEN -- -- -- 0.7 --    A: No current evidence of infection  P:   Trend fever and WBC ct   ENDOCRINE CBG  Lab 03/09/12 0627 03/09/12 0029 03/08/12 1808 03/08/12 1231 03/08/12 0658  GLUCAP 102* 106*106* 95 124* 111*    A:  DM: W/ excellent control currently  Adrenal dysfxn: random cort was 8, but now hemodynamically stable  P:   Monitor CBGs  Hold off from stress dose steroids as hemodynamically stable. If gets hypotensive again would stress dose him w/ solucortef 50mg  q 6.   NEUROLOGIC A: post-op pain, sedation P: PRN analgesia   SUMMARY:  78 year old male w/ h/o colo-rectal CA, admotted on 10/29 w/ SBO. Failed conservative therapy. Had Lysis of adhesion on 11/2. PCCM asked to see on 11/5 for pressor dependent Hypotension.  Responded well to IVF replacement and now off pressors (so favor hypovolemia and narcotics as cause). He does have RLL atelectasis but DO NOT think that this is a PNA. Interestingly he does not have adequate adrenal response w/ random cort only 8. Given the fact taht he is hemodynamically stable we will hold off from steroids as would not want to interfere w/ wound healing. Need to keep eye on I&Os to keep him even. Start mobilizing him and cont routine post-op care. We will sign off. Would keep as SDU patient another 24hrs then could probably move to  med/surg.   I have personally obtained a history, examined the patient, evaluated laboratory and imaging results, formulated the assessment and plan and placed orders.   Levy Pupa, MD, PhD 03/09/2012, 6:24 PM [place] [facility] [phone] or if no answer [phone]

## 2012-03-09 NOTE — Progress Notes (Signed)
Patient ID: Vincent [name], male   DOB: 28-Sep-1932, 78 y.o.   MRN: [id number]  General Surgery - [facility] - Progress Note  POD# 4  Subjective: Patient feels "blown up" this AM.  Denies nausea.  Colostomy bag emptied twice overnight.  Objective: Vital signs in last 24 hours: Temp:  [97.4 F (36.3 C)-98 F (36.7 C)] 97.4 F (36.3 C) (11/06 0800) Pulse Rate:  [51-119] 92  (11/06 0800) Resp:  [15-25] 22  (11/06 0800) BP: (95-156)/(51-71) 108/56 mmHg (11/06 0800) SpO2:  [93 %-100 %] 100 % (11/06 0800) Last BM Date:  (PTA)  Intake/Output from previous day: 11/05 0701 - 11/06 0700 In: 3715.4 [I.V.:3015.4; IV Piggyback:700] Out: 5185 [Urine:3485; Stool:1700]  Exam: HEENT - clear, not icteric Neck - soft Chest - few wheeze, rhonchi bilaterally Cor - RRR Abd - mild distension; BS present; liquid in ostomy bag - moderate; incision clear and dry Ext - no significant edema Neuro - grossly intact, no focal deficits  Lab Results:   Basename 03/09/12 0541 03/08/12 1030  WBC 3.2* 6.5  HGB 8.5* 8.3*  HCT 25.2* 24.7*  PLT 177 199     Basename 03/09/12 0541 03/08/12 2049 03/08/12 1030  NA 132* -- 130*  K 3.3* 3.7 --  CL 98 -- 95*  CO2 26 -- 29  GLUCOSE 117* -- 146*  BUN 13 -- 15  CREATININE 0.88 -- 0.86  CALCIUM 8.3* -- 8.2*    Studies/Results: Dg Chest Port 1 View  03/08/2012  *RADIOLOGY REPORT*  Clinical Data: Right subclavian line placement  PORTABLE CHEST - 1 VIEW  Comparison: Portable exam 1117 hours compared to 03/01/2012  Findings: Right subclavian central venous catheter, tip projecting over SVC. Left subclavian Port-A-Cath, tip projecting over SVC. Upper normal heart size. New opacity at right lung base question atelectasis versus infiltrate. Associated right basilar pleural effusion, new as well. No pneumothorax. Remaining lungs clear. Slight pulmonary vascular congestion noted.  IMPRESSION: No pneumothorax following right subclavian line placement.  Interval development of right pleural effusion with atelectasis versus consolidation at right base.   Original Report Authenticated By: Ulyses Southward, M.D.    Dg Abd Portable 1v  03/08/2012  *RADIOLOGY REPORT*  Clinical Data: Abdominal pain and distention.  Nausea.  Follow up small bowel obstruction.  PORTABLE ABDOMEN - 1 VIEW 03/08/2012 2012 hours:  Comparison: Two-view abdomen x-ray 03/04/2012, 03/03/2012.  CT abdomen pelvis 03/01/2012.  Findings: Interval laparotomy since the examination 4 days ago. Mild gaseous distention of multiple loops of small bowel and in the transverse colon.  No evidence of free intraperitoneal air on the supine image.  IMPRESSION: Post-operative ileus.   Original Report Authenticated By: Hulan Saas, M.D.     Assessment / Plan: EXPLORATORY LAPAROTOMY, LAPAROSCOPIC LYSIS OF ADHESIONS - D. Ezzard Standing - 03/05/2012   - For SBO (small bowel obstruction) secondary to adhesive band - no obvious intra-peritoneal cancer  - early resolution of ileus, allow sips clear liquids  - OOB to chair, ? PT consult S/P rectosigmoid resection with radiation and chemotherapy, for salvage Dr. Clelia Croft.   - Advanced Rectal carcinoma T4N2 - Dr. Earl Gala oncologist, Primary resection by Dr. Nat Christen - 02/13/2010  PAF (paroxysmal atrial fibrillation) - on chronic coumadin/DR. Allyson Sabal. Heparin is SQ, restarted coumadin 03/07/12 HTN  AAA  DVT proph - SQ hep  Sleep Apnea Tobacco use 60 years  Velora Heckler, MD, [facility] Office: [phone]  03/09/2012

## 2012-03-09 NOTE — Progress Notes (Signed)
Nutrition Follow-up  Intervention:  Diet advancement per MD.  Rec CHO MOD   Assessment:   S/P lysis of adhesions 11/2 for SBO.  Hx of colorectal cancer with colostomy (2011).  Per surgeons not, colostomy bag emptied twice overnight.   Diet Order:  Clear liquid sips as tolerated.-tolerating but dislikes.  Meds: Scheduled Meds:   . [EXPIRED] acetaminophen  1,000 mg Intravenous Q6H  . ipratropium  0.5 mg Nebulization QID   And  . albuterol  2.5 mg Nebulization QID  . antiseptic oral rinse  15 mL Mouth Rinse q12n4p  . chlorhexidine  15 mL Mouth Rinse BID  . insulin aspart  0-15 Units Subcutaneous Q6H  . [COMPLETED] ondansetron      . pantoprazole (PROTONIX) IV  40 mg Intravenous Q24H  . [COMPLETED] potassium chloride  10 mEq Intravenous Q1 Hr x 6  . potassium chloride  40 mEq Oral Q4H  . sodium chloride  500 mL Intravenous Once  . Warfarin - Pharmacist Dosing Inpatient   Does not apply q1800   Continuous Infusions:   . sodium chloride 125 mL/hr at 03/08/12 1206  . [DISCONTINUED] phenylephrine (NEO-SYNEPHRINE) Adult infusion Stopped (03/08/12 1948)   PRN Meds:.acetaminophen (TYLENOL) oral liquid 160 mg/5 mL, fentaNYL, Ipratropium-Albuterol, ondansetron (ZOFRAN) IV, ondansetron, phenol, sodium chloride   CMP     Component Value Date/Time   NA 132* 03/09/2012 0541   NA 138 02/24/2012 0803   NA 133 04/09/2011 0838   K 3.3* 03/09/2012 0541   K 3.7 02/24/2012 0803   K 4.6 04/09/2011 0838   CL 98 03/09/2012 0541   CL 102 02/24/2012 0803   CL 98 04/09/2011 0838   CO2 26 03/09/2012 0541   CO2 26 02/24/2012 0803   CO2 31 04/09/2011 0838   GLUCOSE 117* 03/09/2012 0541   GLUCOSE 145* 02/24/2012 0803   GLUCOSE 136* 04/09/2011 0838   BUN 13 03/09/2012 0541   BUN 17.0 02/24/2012 0803   BUN 18 04/09/2011 0838   CREATININE 0.88 03/09/2012 0541   CREATININE 1.0 02/24/2012 0803   CREATININE 1.3* 04/09/2011 0838   CALCIUM 8.3* 03/09/2012 0541   CALCIUM 9.0 02/24/2012 0803   CALCIUM 8.8 04/09/2011  0838   PROT 4.5* 03/08/2012 0030   PROT 6.0* 02/24/2012 0803   PROT 6.5 04/09/2011 0838   ALBUMIN 1.8* 03/08/2012 0030   ALBUMIN 3.0* 02/24/2012 0803   AST 8 03/08/2012 0030   AST 12 02/24/2012 0803   AST 18 04/09/2011 0838   ALT 6 03/08/2012 0030   ALT 15 02/24/2012 0803   ALKPHOS 58 03/08/2012 0030   ALKPHOS 78 02/24/2012 0803   ALKPHOS 67 04/09/2011 0838   BILITOT 0.4 03/08/2012 0030   BILITOT 0.50 02/24/2012 0803   BILITOT 0.70 04/09/2011 0838   GFRNONAA 81* 03/09/2012 0541   GFRAA >90 03/09/2012 0541    CBG (last 3)   Basename 03/09/12 0627 03/09/12 0029 03/08/12 1808  GLUCAP 102* 106*106* 95     Intake/Output Summary (Last 24 hours) at 03/09/12 1025 Last data filed at 03/09/12 0900  Gross per 24 hour  Intake   3537 ml  Output   5285 ml  Net  -1748 ml    Weight Status:  88.3 kg (range=88.3 kg-89.9 kg)  Re-estimated needs:  2150-2300 kcal, 100-120 gm protein  Nutrition Dx:  Inadequate oral intake now related to altered gi function AEB clear liquid diet.  Goal:  Tolerate diet advancement.  Diet to meet >75% estimated needs  Monitor:  Intake, labs, weight  Antonieta Iba, RD, LDN Clinical Inpatient Dietitian Pager:  [phone] Weekend and after hours pager:  [phone]

## 2012-03-09 NOTE — Progress Notes (Addendum)
ANTICOAGULATION CONSULT NOTE  Pharmacy Consult for Coumadin Indication: Hx A.fib  No Known Allergies  Patient Measurements: Height: 5\' 11"  (180.3 cm) Weight: 194 lb 10.7 oz (88.3 kg) IBW/kg (Calculated) : 75.3  Heparin Dosing Weight: 88 kg  Vital Signs: Temp: 97.4 F (36.3 C) (11/06 0800) Temp src: Oral (11/06 0800) BP: 111/85 mmHg (11/06 1200) Pulse Rate: 114  (11/06 1200)  Labs:  Basename 03/09/12 0541 03/08/12 1800 03/08/12 1555 03/08/12 1300 03/08/12 1030 03/08/12 0500 03/08/12 0030 03/07/12 1100  HGB 8.5* -- -- -- 8.3* -- -- --  HCT 25.2* -- -- -- 24.7* -- 22.8* --  PLT 177 -- -- -- 199 -- 151 --  APTT -- -- -- -- -- -- -- --  LABPROT 24.0* -- -- -- -- 22.3* -- 19.5*  INR 2.26* -- -- -- -- 2.05* -- 1.71*  HEPARINUNFRC -- -- -- -- -- -- -- --  CREATININE 0.88 -- -- -- 0.86 -- 0.97 --  CKTOTAL -- -- -- -- -- -- -- --  CKMB -- -- -- -- -- -- -- --  TROPONINI -- <0.30 <0.30 <0.30 -- -- -- --    Estimated Creatinine Clearance: 76.1 ml/min (by C-G formula based on Cr of 0.88).   Medical History: Past Medical History  Diagnosis Date  . Hypertension   . Arthritis   . Heart disease   . SOB (shortness of breath)   . Malignant neoplasm of hepatic flexure   . Malignant neoplasm of rectosigmoid junction   . Cancer     and/or colon polyp  . Malignant neoplasm of hepatic flexure   . Malignant neoplasm of rectosigmoid junction   . Diabetes mellitus   . Atrial fibrillation   . Colostomy care   . AAA (abdominal aortic aneurysm) without rupture 03/02/2012    Medications:  Prescriptions prior to admission  Medication Sig Dispense Refill  . albuterol-ipratropium (COMBIVENT) 18-103 MCG/ACT inhaler Inhale 1 puff into the lungs every 4 (four) hours as needed. WHEEZING      . aspirin EC 81 MG tablet Take 81 mg by mouth daily.      . digoxin (LANOXIN) 0.125 MG tablet Take 1 tablet (125 mcg total) by mouth daily.  30 tablet  0  . Fluticasone-Salmeterol (ADVAIR DISKUS) 500-50  MCG/DOSE AEPB Inhale 1 puff into the lungs every 12 (twelve) hours.  60 each  3  . HYDROmorphone (DILAUDID) 4 MG tablet Take 4 mg by mouth every 6 (six) hours as needed. Pain      . metFORMIN (GLUCOPHAGE) 500 MG tablet Take 500 mg by mouth daily with breakfast.       . nitroGLYCERIN (NITROSTAT) 0.3 MG SL tablet Place 0.3 mg under the tongue every 5 (five) minutes as needed.      Marland Kitchen oxyCODONE (OXY IR/ROXICODONE) 5 MG immediate release tablet Take 5 mg by mouth every 6 (six) hours as needed. Breakthrough pain      . pantoprazole (PROTONIX) 40 MG tablet Take 1 tablet (40 mg total) by mouth daily at 12 noon.  30 tablet  0  . simvastatin (ZOCOR) 40 MG tablet Take 1 tablet (40 mg total) by mouth at bedtime.  30 tablet  0  . temazepam (RESTORIL) 15 MG capsule Take 15 mg by mouth at bedtime as needed.      . warfarin (COUMADIN) 5 MG tablet Take 0.5 tablets (2.5 mg total) by mouth daily. Pt takes 5 mg every day except on Monday and Friday pt takes 2.5mg  ( 1/2  tab of 5 mg tablet)  30 tablet  0  . zolpidem (AMBIEN) 5 MG tablet Take 1 tablet (5 mg total) by mouth at bedtime as needed for sleep (insomnia).  30 tablet  0    Assessment:  78yo M admitted with SBO 10/29.  Underwent open surgery for SBO & LOA on 11/2.  On chronic Coumadin for hx A.fib, held since admission. Remains in A.fib.  SQ heparin for VTE prophylaxis was resumed 11/3. Last dose given 06:26 11/4.  Pharmacy asked to resuming anticoagulation 11/4.  Home warfarin dose reported as 5 mg daily except takes 2.5 mg on M/F.  INR therapeutic but increased today despite holding dose last night.  Unsure of reason why INR continues to increase but will hold warfarin dose again tonight to avoid supratherapeutic INR.  H/H low but appears stable. No bleeding/complications reported.  Goal of Therapy:  INR 2-3 Monitor platelets by anticoagulation protocol: Yes   Plan:   No warfarin today.  F/u daily PT/INR & CBC.  Clance Boll, PharmD,  BCPS Pager: [phone] 03/09/2012 1:53 PM

## 2012-03-10 DIAGNOSIS — D72819 Decreased white blood cell count, unspecified: Secondary | ICD-10-CM | POA: Diagnosis present

## 2012-03-10 DIAGNOSIS — D649 Anemia, unspecified: Secondary | ICD-10-CM | POA: Diagnosis present

## 2012-03-10 DIAGNOSIS — R7989 Other specified abnormal findings of blood chemistry: Secondary | ICD-10-CM | POA: Diagnosis present

## 2012-03-10 DIAGNOSIS — E876 Hypokalemia: Secondary | ICD-10-CM | POA: Diagnosis present

## 2012-03-10 DIAGNOSIS — I5032 Chronic diastolic (congestive) heart failure: Secondary | ICD-10-CM | POA: Diagnosis present

## 2012-03-10 DIAGNOSIS — E46 Unspecified protein-calorie malnutrition: Secondary | ICD-10-CM | POA: Diagnosis present

## 2012-03-10 LAB — PROCALCITONIN: Procalcitonin: 0.79 ng/mL

## 2012-03-10 LAB — PROTIME-INR
INR: 2.47 — ABNORMAL HIGH (ref 0.00–1.49)
Prothrombin Time: 25.6 seconds — ABNORMAL HIGH (ref 11.6–15.2)

## 2012-03-10 LAB — GLUCOSE, CAPILLARY
Glucose-Capillary: 102 mg/dL — ABNORMAL HIGH (ref 70–99)
Glucose-Capillary: 108 mg/dL — ABNORMAL HIGH (ref 70–99)
Glucose-Capillary: 122 mg/dL — ABNORMAL HIGH (ref 70–99)
Glucose-Capillary: 124 mg/dL — ABNORMAL HIGH (ref 70–99)

## 2012-03-10 LAB — BASIC METABOLIC PANEL
Calcium: 7.9 mg/dL — ABNORMAL LOW (ref 8.4–10.5)
GFR calc non Af Amer: 81 mL/min — ABNORMAL LOW (ref >90)
Glucose, Bld: 108 mg/dL — ABNORMAL HIGH (ref 70–99)
Sodium: 135 mEq/L (ref 135–145)

## 2012-03-10 LAB — CBC WITH DIFFERENTIAL/PLATELET
Basophils Relative: 1 % (ref 0–1)
Eosinophils Absolute: 0 10*3/uL (ref 0.0–0.7)
Lymphs Abs: 0.3 10*3/uL — ABNORMAL LOW (ref 0.7–4.0)
MCH: 30.9 pg (ref 26.0–34.0)
MCHC: 34 g/dL (ref 30.0–36.0)
Neutrophils Relative %: 52 % (ref 43–77)
Platelets: 175 10*3/uL (ref 150–400)
RBC: 2.65 MIL/uL — ABNORMAL LOW (ref 4.22–5.81)

## 2012-03-10 MED ORDER — SODIUM CHLORIDE 0.9 % IV BOLUS (SEPSIS)
1000.0000 mL | Freq: Once | INTRAVENOUS | Status: AC
  Administered 2012-03-10: 1000 mL via INTRAVENOUS

## 2012-03-10 MED ORDER — POTASSIUM CHLORIDE IN NACL 40-0.9 MEQ/L-% IV SOLN
INTRAVENOUS | Status: DC
  Administered 2012-03-10 – 2012-03-11 (×4): via INTRAVENOUS
  Administered 2012-03-12 (×2): 20 mL/h via INTRAVENOUS
  Filled 2012-03-10 (×7): qty 1000

## 2012-03-10 NOTE — Progress Notes (Signed)
ANTICOAGULATION CONSULT NOTE  Pharmacy Consult for Coumadin Indication: Hx A.fib  No Known Allergies  Patient Measurements: Height: 5\' 11"  (180.3 cm) Weight: 192 lb 3.9 oz (87.2 kg) IBW/kg (Calculated) : 75.3   Vital Signs: Temp: 97.7 F (36.5 C) (11/07 0800) Temp src: Oral (11/07 0800) BP: 120/57 mmHg (11/07 0800) Pulse Rate: 118  (11/07 0933)  Labs:  Basename 03/10/12 0453 03/09/12 0541 03/08/12 1800 03/08/12 1555 03/08/12 1300 03/08/12 1030 03/08/12 0500  HGB 8.2* 8.5* -- -- -- -- --  HCT 24.1* 25.2* -- -- -- 24.7* --  PLT 175 177 -- -- -- 199 --  APTT -- -- -- -- -- -- --  LABPROT 25.6* 24.0* -- -- -- -- 22.3*  INR 2.47* 2.26* -- -- -- -- 2.05*  HEPARINUNFRC -- -- -- -- -- -- --  CREATININE 0.88 0.88 -- -- -- 0.86 --  CKTOTAL -- -- -- -- -- -- --  CKMB -- -- -- -- -- -- --  TROPONINI -- -- <0.30 <0.30 <0.30 -- --    Estimated Creatinine Clearance: 76.1 ml/min (by C-G formula based on Cr of 0.88).  Assessment:  78yo M admitted with SBO 10/29.  Underwent open surgery for SBO & LOA on 11/2.  On chronic Coumadin for hx A.fib, held since admission. Remains in A.fib.  SQ heparin for VTE prophylaxis was resumed 11/3. Last dose given 06:26 11/4.  Pharmacy asked to resuming anticoagulation 11/4.  Home warfarin dose reported as 5 mg daily except takes 2.5 mg on M/F.  INR therapeutic but increased again today despite holding doses x past two days so will continue to hold warfarin at this time.  H/H low but appears stable. No bleeding/complications reported.  Pt advanced to FLD today.  Goal of Therapy:  INR 2-3 Monitor platelets by anticoagulation protocol: Yes   Plan:   No warfarin today.  F/u daily PT/INR & CBC.  Clance Boll, PharmD, BCPS Pager: [phone] 03/10/2012 10:19 AM

## 2012-03-10 NOTE — Progress Notes (Signed)
Pt adamantly refusing to get out of bed to chair. Pt educated multiple times on risks of staying in bed and not mobilizing. Will continue to educate and encourage patient to get out of bed.

## 2012-03-10 NOTE — Progress Notes (Signed)
[facility] PROGRESS NOTE  Vincent [name] ZOX:[id number] DOB: 03/13/1933 DOA: 03/01/2012 PCP: Aura Dials, MD  Brief narrative: 78 year old male with previous history of rectal carcinoma (diagnosed in 2011) status post surgical resection and colostomy, radiation and chemotherapy, recurrent carcinoma with nodal metastases noted on CT abdomen/pelvis. Patient was admitted for small bowel obstruction on 03/01/12, and initially treated conservatively.  Patient underwent exploratory laparoscopy which was converted to open enterolysis of adhesions with oversew of enterotomy on 03/06/12.  His post-operative course was complicated by hypotension requiring pressor support.  PCCM was consulted.  His condition stabilized with IVF and the pressors were rapidly weaned.    Assessment/Plan: Principal Problem:  *SBO (small bowel obstruction) /  Nausea & vomiting  Failed conservative therapy with bowel rest, NG decompression.    Underwent exploratory laparoscopy which was converted to open enterolysis of adhesions with oversew of enterotomy on 03/06/12.  Diet advanced to FL.  Plan to increase mobility. Active Problems:  Rectal carcinoma  Seen by Dr. Clelia Croft on 03/07/12.  On FOLFIRI for salvage therapy, on hold until he recovers from his surgery.  Abdominal pain  Secondary to SBO, surgical pain.  Pain medication as needed.  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  Monitor.  PAF (paroxysmal atrial fibrillation) on chronic coumadin  Received 2 units of FFP prior to surgery, now back on coumadin.  Required a Cardizem drip for rate control.  DM (diabetes mellitus)  CBGs 102-156 on moderate scale SSI Q 6 hours.  AAA (abdominal aortic aneurysm) without rupture  4.1 by 4.4 cm on CT 03/01/12: appears unchanged.  Maintain good BP control.  Hypotension  Developed hypotension post-operatively on 03/07/12 requiring IVF and pressor support.  No evidence of adrenal insufficiency.  BP stable  now.  Hypokalemia  Monitor and replace as needed.  Elevated brain natriuretic peptide (BNP) level / chronic diastolic CHF  Grade I diastolic dysfunction noted on Echo done 02/09/10.  Monitor fluid balance closely.  No evidence of clinically significant CHF.  Protein calorie malnutrition  Seen by dietician 03/09/12.  Advance diet as tolerated.  Normocytic anemia / Leukopenia  Likely the sequelae of prior chemotherapy.  Monitor.   Code Status: DNR Family Communication: Updated at bedside. Disposition Plan: Home versus SNF depending on progress.   Medical Consultants:  Dr. Levy Pupa, PCCM.  Dr. Glenna Fellows, Surgery.  Other Consultants:  Physical therapy: HH PT; Intermittent supervision vs. SNF if progress is slow.  Anti-infectives:  Mefoxin 03/05/12--->03/06/12  HPI/Subjective: Vincent [name] is tolerating his FL diet well.  No N/V.  Still with post-surgical abdominal discomfort.  Does not feel ready to increase his mobility.    Objective: Filed Vitals:   03/10/12 1400 03/10/12 1418 03/10/12 1500 03/10/12 1538  BP: 93/39 101/58    Pulse: 59 42 47   Temp:      TempSrc:      Resp: 24 17 25    Height:      Weight:      SpO2: 97% 91% 94% 94%    Intake/Output Summary (Last 24 hours) at 03/10/12 1617 Last data filed at 03/10/12 1500  Gross per 24 hour  Intake   4110 ml  Output   4080 ml  Net     30 ml    Exam: Gen:  NAD Cardiovascular:  HSIR, tachy Respiratory:  Lungs with a few rhonchi Gastrointestinal:  Abdomen soft, NT/ND, tender at surgical site. Extremities:  No C/E/C  Data Reviewed: Basic Metabolic Panel:  Lab 03/10/12  1610 03/09/12 1230 03/09/12 0541 03/08/12 1030 03/08/12 0030 03/07/12 0403  NA 135 -- 132* 130* 131* 130*  K 3.3* -- 3.3* -- -- --  CL 102 -- 98 95* 98 94*  CO2 24 -- 26 29 30 31   GLUCOSE 108* -- 117* 146* 101* 126*  BUN 12 -- 13 15 17 12   CREATININE 0.88 -- 0.88 0.86 0.97 0.88  CALCIUM 7.9* -- 8.3* 8.2* 8.1* 8.4  MG 1.6 1.8 --  -- 1.5 --  PHOS -- -- -- -- 3.1 --   GFR Estimated Creatinine Clearance: 76.1 ml/min (by C-G formula based on Cr of 0.88). Liver Function Tests:  Lab 03/08/12 0030  AST 8  ALT 6  ALKPHOS 58  BILITOT 0.4  PROT 4.5*  ALBUMIN 1.8*    Lab 03/08/12 0030  LIPASE 7*  AMYLASE --   Coagulation profile  Lab 03/10/12 0453 03/09/12 0541 03/08/12 0500 03/07/12 1100 03/05/12 0847  INR 2.47* 2.26* 2.05* 1.71* 1.66*  PROTIME -- -- -- -- --    CBC:  Lab 03/10/12 0453 03/09/12 0541 03/08/12 1030 03/08/12 0030 03/07/12 0403 03/06/12 0320  WBC 1.7* 3.2* 6.5 4.0 7.5 --  NEUTROABS 0.9* 2.2 -- 3.3 6.5 6.4  HGB 8.2* 8.5* 8.3* 7.6* 9.2* --  HCT 24.1* 25.2* 24.7* 22.8* 27.2* --  MCV 90.9 91.3 90.8 91.6 89.8 --  PLT 175 177 199 151 179 --   Cardiac Enzymes:  Lab 03/08/12 1800 03/08/12 1555 03/08/12 1300  CKTOTAL -- -- --  CKMB -- -- --  CKMBINDEX -- -- --  TROPONINI <0.30 <0.30 <0.30   BNP (last 3 results)  Basename 03/08/12 0030  PROBNP 2257.0*   CBG:  Lab 03/10/12 1140 03/10/12 0533 03/10/12 0003 03/09/12 1745 03/09/12 1119  GLUCAP 156* 102* 108*108* 124* 146*   Microbiology Recent Results (from the past 240 hour(s))  SURGICAL PCR SCREEN     Status: Normal   Collection Time   03/04/12  1:31 PM      Component Value Range Status Comment   MRSA, PCR NEGATIVE  NEGATIVE Final    Staphylococcus aureus NEGATIVE  NEGATIVE Final      Procedures and Diagnostic Studies:  Ct Abdomen Pelvis W Contrast 03/01/2012 IMPRESSION:  1.  Study is positive for small bowel obstruction with the transition point in the right lower quadrant.  Obstruction appears to be due to adhesions. 2.  No change in appearance of recurrent/residual rectal carcinoma since the most recent CT. 3.  Unchanged 4.4 cm abdominal aortic aneurysm.   Original Report Authenticated By: Bernadene Bell. D'ALESSIO, M.D.     Dg Chest Port 1 View 03/08/2012 IMPRESSION: No pneumothorax following right subclavian line placement. Interval  development of right pleural effusion with atelectasis versus consolidation at right base.   Original Report Authenticated By: Ulyses Southward, M.D.     Dg Abd 2 Views 03/04/2012  IMPRESSION: Partial small bowel obstruction, likely mildly increased.  No free intraperitoneal air or other acute complication.   Original Report Authenticated By: Jeronimo Greaves, M.D.     Dg Abd Acute W/chest 03/01/2012 IMPRESSION: No active cardiopulmonary disease.  Mild small bowel dilatation with air-fluid levels, improved from the  prior study.  This may represent early small bowel obstruction and continued follow-up is suggested.   Original Report Authenticated By: Camelia Phenes, M.D.     Dg Abd Portable 1v 03/08/2012 IMPRESSION: Post-operative ileus.   Original Report Authenticated By: Hulan Saas, M.D.     Dg Abd Portable 2v 03/03/2012  IMPRESSION: Small bowel obstruction, grossly unchanged.  Enteric tube with its tip in the gastric cardia and side port in the distal esophagus.  Consider advancement.   Original Report Authenticated By: Charline Bills, M.D.     Scheduled Meds:    . ipratropium  0.5 mg Nebulization QID   And  . albuterol  2.5 mg Nebulization QID  . antiseptic oral rinse  15 mL Mouth Rinse q12n4p  . chlorhexidine  15 mL Mouth Rinse BID  . digoxin  0.125 mg Oral Daily  . heparin lock flush  500 Units Intracatheter Q30 days  . insulin aspart  0-15 Units Subcutaneous Q6H  . pantoprazole (PROTONIX) IV  40 mg Intravenous Q24H  . [COMPLETED] sodium chloride  1,000 mL Intravenous Once  . sodium chloride  500 mL Intravenous Once  . Warfarin - Pharmacist Dosing Inpatient   Does not apply q1800   Continuous Infusions:    . sodium chloride 125 mL/hr at 03/10/12 1129    Time spent: 35 minutes.   LOS: 9 days   Mary Hockey  [facility] Pager [phone].  If 8PM-8AM, please contact night-coverage at [email or web address], password [facility] 03/10/2012, 4:17 PM

## 2012-03-10 NOTE — Progress Notes (Signed)
Patient ID: Vincent [name], male   DOB: [birth year], 78 y.o.   MRN: [id number] 5 Days Post-Op  Subjective: Some intermittant mid abd pain, not severe.  Tol CL without nausea.  Ostomy working. Felt too weak to get out of bed yesterday.  Objective: Vital signs in last 24 hours: Temp:  [97.4 F (36.3 C)-98.4 F (36.9 C)] 98.4 F (36.9 C) (11/07 0400) Pulse Rate:  [88-114] 109  (11/07 0000) Resp:  [18-25] 18  (11/07 0600) BP: (108-154)/(49-85) 122/67 mmHg (11/07 0600) SpO2:  [84 %-100 %] 91 % (11/07 0600) Weight:  [192 lb 3.9 oz (87.2 kg)] 192 lb 3.9 oz (87.2 kg) (11/07 0400) Last BM Date: 03/09/12  Intake/Output from previous day: 11/06 0701 - 11/07 0700 In: 3625 [I.V.:2625; IV Piggyback:1000] Out: 4855 [Urine:1205; Stool:3650] Intake/Output this shift:    General appearance: alert, fatigued and no distress Resp: wheezes bilaterally and mild without increased work of breathing GI: normal findings: soft, non-tender and non distended Extremities: No edema Incision/Wound:Clean without evidence of infection  Lab Results:   Basename 03/10/12 0453 03/09/12 0541  WBC 1.7* 3.2*  HGB 8.2* 8.5*  HCT 24.1* 25.2*  PLT 175 177   BMET  Basename 03/10/12 0453 03/09/12 0541  NA 135 132*  K 3.3* 3.3*  CL 102 98  CO2 24 26  GLUCOSE 108* 117*  BUN 12 13  CREATININE 0.88 0.88  CALCIUM 7.9* 8.3*     Studies/Results: Dg Chest Port 1 View  03/08/2012  *RADIOLOGY REPORT*  Clinical Data: Right subclavian line placement  PORTABLE CHEST - 1 VIEW  Comparison: Portable exam 1117 hours compared to 03/01/2012  Findings: Right subclavian central venous catheter, tip projecting over SVC. Left subclavian Port-A-Cath, tip projecting over SVC. Upper normal heart size. New opacity at right lung base question atelectasis versus infiltrate. Associated right basilar pleural effusion, new as well. No pneumothorax. Remaining lungs clear. Slight pulmonary vascular congestion noted.  IMPRESSION: No pneumothorax  following right subclavian line placement. Interval development of right pleural effusion with atelectasis versus consolidation at right base.   Original Report Authenticated By: Ulyses Southward, M.D.    Dg Abd Portable 1v  03/08/2012  *RADIOLOGY REPORT*  Clinical Data: Abdominal pain and distention.  Nausea.  Follow up small bowel obstruction.  PORTABLE ABDOMEN - 1 VIEW 03/08/2012 2012 hours:  Comparison: Two-view abdomen x-ray 03/04/2012, 03/03/2012.  CT abdomen pelvis 03/01/2012.  Findings: Interval laparotomy since the examination 4 days ago. Mild gaseous distention of multiple loops of small bowel and in the transverse colon.  No evidence of free intraperitoneal air on the supine image.  IMPRESSION: Post-operative ileus.   Original Report Authenticated By: Hulan Saas, M.D.     Anti-infectives: Anti-infectives     Start     Dose/Rate Route Frequency Ordered Stop   03/05/12 1800   cefOXitin (MEFOXIN) 1 g in dextrose 5 % 50 mL IVPB        1 g 100 mL/hr over 30 Minutes Intravenous 3 times per day 03/05/12 1318 03/06/12 1428          Assessment/Plan: s/p Procedure(s): EXPLORATORY LAPAROTOMY LAPAROSCOPIC LYSIS OF ADHESIONS Rectal CA Hypotension better Large ostomy output post obstruction Abdomen OK Will advance to FL diet PT and mobilization   LOS: 9 days    Lachanda Buczek T 03/10/2012

## 2012-03-10 NOTE — Clinical Social Work Note (Signed)
CSW revisited issue of possible need for SNF with Pt. Pt still refuses SNF search. CSW left list of SNFs with Pt. Pt does have 24hr help at home from family. CSW will revisit SNF if Pt does not progress.   Doreen Salvage, LCSW ICU/Stepdown Clinical Social Worker [facility] Cell [phone] Hours 8am-1200pm M-F

## 2012-03-10 NOTE — Progress Notes (Signed)
Patient has CPAP from home at bedside. He does not wish to use it tonight. He states that he does not use it consistently at home every night and has not consistent for the past two years. However, he does admit that he uses it from time to time. He is aware that RT is available at any time if he should require assistance with set-up and donning of equipment and that he may notify RT or RN for help.

## 2012-03-10 NOTE — Progress Notes (Signed)
eLink Physician-Brief Progress Note Patient Name: Vincent [name] DOB: [birth year] MRN: [id number]  Date of Service  03/10/2012   HPI/Events of Note   RN says no urine output since removing foley at 6pm. Only 90cc urine on bladder scan. CVP is 8.   Lab 03/09/12 0541 03/08/12 1030 03/08/12 0030 03/07/12 0403 03/06/12 0320  CREATININE 0.88 0.86 0.97 0.88 0.80    Estimated Creatinine Clearance: 76.1 ml/min (by C-G formula based on Cr of 0.88).   eICU Interventions  1L fluid bolus and reasess   Intervention Category Intermediate Interventions: Oliguria - evaluation and management  Jovanni Eckhart 03/10/2012, 1:47 AM

## 2012-03-11 LAB — BASIC METABOLIC PANEL
CO2: 25 mEq/L (ref 19–32)
Chloride: 103 mEq/L (ref 96–112)
Glucose, Bld: 111 mg/dL — ABNORMAL HIGH (ref 70–99)
Sodium: 134 mEq/L — ABNORMAL LOW (ref 135–145)

## 2012-03-11 LAB — CBC WITH DIFFERENTIAL/PLATELET
Basophils Relative: 0 % (ref 0–1)
Eosinophils Relative: 2 % (ref 0–5)
HCT: 24.1 % — ABNORMAL LOW (ref 39.0–52.0)
Hemoglobin: 8 g/dL — ABNORMAL LOW (ref 13.0–17.0)
Lymphs Abs: 0.4 10*3/uL — ABNORMAL LOW (ref 0.7–4.0)
MCH: 30.2 pg (ref 26.0–34.0)
MCV: 90.9 fL (ref 78.0–100.0)
Monocytes Absolute: 0.6 10*3/uL (ref 0.1–1.0)
Monocytes Relative: 24 % — ABNORMAL HIGH (ref 3–12)
Neutro Abs: 1.5 10*3/uL — ABNORMAL LOW (ref 1.7–7.7)
RBC: 2.65 MIL/uL — ABNORMAL LOW (ref 4.22–5.81)
WBC: 2.6 10*3/uL — ABNORMAL LOW (ref 4.0–10.5)

## 2012-03-11 LAB — GLUCOSE, CAPILLARY
Glucose-Capillary: 109 mg/dL — ABNORMAL HIGH (ref 70–99)
Glucose-Capillary: 133 mg/dL — ABNORMAL HIGH (ref 70–99)
Glucose-Capillary: 157 mg/dL — ABNORMAL HIGH (ref 70–99)

## 2012-03-11 LAB — PATHOLOGIST SMEAR REVIEW

## 2012-03-11 MED ORDER — FLUTICASONE-SALMETEROL 500-50 MCG/DOSE IN AEPB
1.0000 | INHALATION_SPRAY | Freq: Two times a day (BID) | RESPIRATORY_TRACT | Status: DC
  Administered 2012-03-11 – 2012-03-13 (×5): 1 via RESPIRATORY_TRACT
  Filled 2012-03-11: qty 14

## 2012-03-11 MED ORDER — ASPIRIN 81 MG PO CHEW
81.0000 mg | CHEWABLE_TABLET | Freq: Every day | ORAL | Status: DC
  Administered 2012-03-11 – 2012-03-13 (×3): 81 mg via ORAL
  Filled 2012-03-11 (×3): qty 1

## 2012-03-11 MED ORDER — TAB-A-VITE/IRON PO TABS
1.0000 | ORAL_TABLET | Freq: Every day | ORAL | Status: DC
  Administered 2012-03-11 – 2012-03-13 (×3): 1 via ORAL
  Filled 2012-03-11 (×3): qty 1

## 2012-03-11 MED ORDER — INSULIN ASPART 100 UNIT/ML ~~LOC~~ SOLN
0.0000 [IU] | Freq: Three times a day (TID) | SUBCUTANEOUS | Status: DC
  Administered 2012-03-11: 3 [IU] via SUBCUTANEOUS
  Administered 2012-03-11 – 2012-03-13 (×2): 2 [IU] via SUBCUTANEOUS

## 2012-03-11 MED ORDER — WARFARIN SODIUM 2 MG PO TABS
2.0000 mg | ORAL_TABLET | Freq: Once | ORAL | Status: AC
  Administered 2012-03-11: 2 mg via ORAL
  Filled 2012-03-11: qty 1

## 2012-03-11 MED ORDER — INSULIN ASPART 100 UNIT/ML ~~LOC~~ SOLN: 0.0000 [IU] | Freq: Every day | SUBCUTANEOUS | Status: DC

## 2012-03-11 MED ORDER — PANTOPRAZOLE SODIUM 40 MG PO TBEC
40.0000 mg | DELAYED_RELEASE_TABLET | Freq: Every day | ORAL | Status: DC
  Administered 2012-03-11 – 2012-03-13 (×3): 40 mg via ORAL
  Filled 2012-03-11 (×3): qty 1

## 2012-03-11 MED ORDER — HYDROMORPHONE HCL 2 MG PO TABS
1.0000 mg | ORAL_TABLET | ORAL | Status: DC | PRN
  Administered 2012-03-11 – 2012-03-13 (×4): 2 mg via ORAL
  Filled 2012-03-11 (×4): qty 1

## 2012-03-11 MED ORDER — NITROGLYCERIN 0.4 MG SL SUBL: 0.4000 mg | SUBLINGUAL_TABLET | SUBLINGUAL | Status: DC | PRN

## 2012-03-11 NOTE — Progress Notes (Signed)
6 Days Post-Op  Subjective: Seems happier, and moves better in bed.  Doesn't like full liquids, draining some from base of wound.  Objective: Vital signs in last 24 hours: Temp:  [97.5 F (36.4 C)-98.7 F (37.1 C)] 98.7 F (37.1 C) (11/08 0500) Pulse Rate:  [42-118] 61  (11/08 0500) Resp:  [17-25] 24  (11/08 0500) BP: (93-145)/(39-70) 138/70 mmHg (11/08 0500) SpO2:  [78 %-100 %] 97 % (11/08 0802) Last BM Date:  (COLOSTOMY)  3600 from colostomy 11/6, 420 recorded yesterday. I/O positive 5.4 liters.  600 ml PO recorded, Diet: full liquids Afebrile, BSS, HR is better. WBC and H/H low, but stable. Intake/Output from previous day: 11/07 0701 - 11/08 0700 In: 1925 [P.O.:600; I.V.:1325] Out: 895 [Urine:475; Stool:420] Intake/Output this shift:    General appearance: alert, cooperative and no distress Resp: some wheezing and rales both sides GI: soft, gas and stool in ostomy.  draining purulents looking liquid from base of the wound, sl red too.  No distension, abd is not nearly as tender as before.  Lab Results:   [facility] 03/11/12 0615 03/10/12 0453  WBC 2.6* 1.7*  HGB 8.0* 8.2*  HCT 24.1* 24.1*  PLT 183 175    BMET  Basename 03/11/12 0615 03/10/12 0453  NA 134* 135  K 3.5 3.3*  CL 103 102  CO2 25 24  GLUCOSE 111* 108*  BUN 8 12  CREATININE 0.81 0.88  CALCIUM 7.8* 7.9*   PT/INR  Basename 03/11/12 0615 03/10/12 0453  LABPROT 24.3* 25.6*  INR 2.30* 2.47*     Lab 03/08/12 0030  AST 8  ALT 6  ALKPHOS 58  BILITOT 0.4  PROT 4.5*  ALBUMIN 1.8*     Lipase     Component Value Date/Time   LIPASE 7* 03/08/2012 0030     Studies/Results: No results found.  Medications:    . ipratropium  0.5 mg Nebulization QID   And  . albuterol  2.5 mg Nebulization QID  . antiseptic oral rinse  15 mL Mouth Rinse q12n4p  . chlorhexidine  15 mL Mouth Rinse BID  . digoxin  0.125 mg Oral Daily  . heparin lock flush  500 Units Intracatheter Q30 days  . insulin aspart   0-15 Units Subcutaneous Q6H  . pantoprazole (PROTONIX) IV  40 mg Intravenous Q24H  . sodium chloride  500 mL Intravenous Once  . Warfarin - Pharmacist Dosing Inpatient   Does not apply q1800   Prior to Admission medications   Medication Sig Start Date End Date Taking? Authorizing Provider  albuterol-ipratropium (COMBIVENT) 18-103 MCG/ACT inhaler Inhale 1 puff into the lungs every 4 (four) hours as needed. WHEEZING 12/28/11  Yes Alison Murray, MD  aspirin EC 81 MG tablet Take 81 mg by mouth daily. 01/16/12 01/15/13 Yes Laveda Norman, MD  digoxin (LANOXIN) 0.125 MG tablet Take 1 tablet (125 mcg total) by mouth daily. 12/28/11  Yes Alison Murray, MD  Fluticasone-Salmeterol (ADVAIR DISKUS) 500-50 MCG/DOSE AEPB Inhale 1 puff into the lungs every 12 (twelve) hours. 12/28/11  Yes Alison Murray, MD  HYDROmorphone (DILAUDID) 4 MG tablet Take 4 mg by mouth every 6 (six) hours as needed. Pain 12/28/11  Yes Historical Provider, MD  metFORMIN (GLUCOPHAGE) 500 MG tablet Take 500 mg by mouth daily with breakfast.  11/12/11  Yes Historical Provider, MD  nitroGLYCERIN (NITROSTAT) 0.3 MG SL tablet Place 0.3 mg under the tongue every 5 (five) minutes as needed. 01/16/12 01/15/13 Yes Laveda Norman, MD  oxyCODONE (OXY IR/ROXICODONE) 5 MG immediate release tablet Take 5 mg by mouth every 6 (six) hours as needed. Breakthrough pain 12/28/11  Yes Historical Provider, MD  pantoprazole (PROTONIX) 40 MG tablet Take 1 tablet (40 mg total) by mouth daily at 12 noon. 12/28/11 12/27/12 Yes Alison Murray, MD  simvastatin (ZOCOR) 40 MG tablet Take 1 tablet (40 mg total) by mouth at bedtime. 12/28/11  Yes Alison Murray, MD  temazepam (RESTORIL) 15 MG capsule Take 15 mg by mouth at bedtime as needed. 02/10/12  Yes Benjiman Core, MD  warfarin (COUMADIN) 5 MG tablet Take 0.5 tablets (2.5 mg total) by mouth daily. Pt takes 5 mg every day except on Monday and Friday pt takes 2.5mg  ( 1/2 tab of 5 mg tablet) 12/28/11  Yes Alison Murray, MD  zolpidem  (AMBIEN) 5 MG tablet Take 1 tablet (5 mg total) by mouth at bedtime as needed for sleep (insomnia). 12/28/11 12/27/12 Yes Alma Concepcion Elk, MD     Assessment/Plan EXPLORATORY LAPAROTOMY, LAPAROSCOPIC LYSIS OF ADHESIONS - D. Ezzard Standing - 03/05/2012  - For SBO (small bowel obstruction) secondary to adhesive band - no obvious intra-peritoneal cancer  - early resolution of ileus, allow sips clear liquids  - OOB to chair, ? PT consult  S/P rectosigmoid resection with radiation and chemotherapy, for salvage Dr. Clelia Croft.  - Advanced Rectal carcinoma T4N2 - Dr. Earl Gala oncologist, Primary resection by Dr. Nat Christen - 02/13/2010  PAF (paroxysmal atrial fibrillation) - on chronic coumadin/DR. Allyson Sabal. Heparin is SQ, restarted coumadin 03/07/12  HTN  AAA  DVT proph - SQ hep  Sleep Apnea  Tobacco use 60 years   Plan:  i took out 3 staples from the base and opened the wound, it tracks down to the fascia, but not to much in the cephalad direction.  i left this open and packed, will begin wet to dry dressings. I also got a culture.  I will try and get him back on home meds.  He is on NEBS in place of MDI at home and I will continue that.  Advance to low fiber diet.  He live at home with his wife.  i told him when he could walk well he could go home.  He is already therapeutic on coumadin. i got pharmacy to restart Advair.  Restart PO pain meds.  LOS: 10 days    Vincent [name] 03/11/2012

## 2012-03-11 NOTE — Progress Notes (Signed)
ANTICOAGULATION CONSULT NOTE  Pharmacy Consult for Coumadin Indication: Hx A.fib  No Known Allergies  Patient Measurements: Height: 5\' 11"  (180.3 cm) Weight: 192 lb 3.9 oz (87.2 kg) IBW/kg (Calculated) : 75.3   Vital Signs: Temp: 98.7 F (37.1 C) (11/08 0500) Temp src: Oral (11/08 0500) BP: 138/70 mmHg (11/08 0500) Pulse Rate: 61  (11/08 0500)  Labs:  Basename 03/11/12 0615 03/10/12 0453 03/09/12 0541 03/08/12 1800 03/08/12 1555 03/08/12 1300  HGB 8.0* 8.2* -- -- -- --  HCT 24.1* 24.1* 25.2* -- -- --  PLT 183 175 177 -- -- --  APTT -- -- -- -- -- --  LABPROT 24.3* 25.6* 24.0* -- -- --  INR 2.30* 2.47* 2.26* -- -- --  HEPARINUNFRC -- -- -- -- -- --  CREATININE 0.81 0.88 0.88 -- -- --  CKTOTAL -- -- -- -- -- --  CKMB -- -- -- -- -- --  TROPONINI -- -- -- <0.30 <0.30 <0.30    Estimated Creatinine Clearance: 82.6 ml/min (by C-G formula based on Cr of 0.81).  Assessment:  78yo M admitted with SBO 10/29.  Underwent open surgery for SBO & LOA on 11/2.  On chronic Coumadin for hx A.fib, held since admission. Remains in A.fib.  INR remains in therapeutic range despite no Coumadin x 3 days. Home dose = 5mg  daily except 2.5mg  M/F.  H/H low but stable. No bleeding/complications reported.  Advanced diet to low fiber. Patient says his appetite is improving.  Goal of Therapy:  INR 2-3 Monitor platelets by anticoagulation protocol: Yes   Plan:   Will give small dose of Coumadin 2mg  today to help keep INR from falling below 2. Not sure how sensitive he will be.  F/u daily PT/INR & CBC.  Charolotte Eke, PharmD, pager [phone]. 03/11/2012,11:31 AM.

## 2012-03-11 NOTE — Progress Notes (Signed)
Spoke with patient about the use of nocturnal PPV tonight. He states he has no interest in ever using a CPAP machine again. He feels his home machine is too bulky and too much trouble. Offered a smaller machine to use while in the hospital, and he continues to refuse.

## 2012-03-11 NOTE — Progress Notes (Signed)
Physical Therapy Treatment Patient Details Name: Vincent [name] MRN: [id number] DOB: 1933-03-15 Today's Date: 03/11/2012 Time: 4098-1191 PT Time Calculation (min): 24 min  PT Assessment / Plan / Recommendation Comments on Treatment Session  Pt. cooperative today, requires encouragement. Pt. able to ambulate in hall with RW x 180 ft. Sats > 96%, on RA. noted dyspnea 3/4. pt plans DC home w/24/7 assistance. rec. HHPT    Follow Up Recommendations  Home health PT;Supervision/Assistance - 24 hour     Does the patient have the potential to tolerate intense rehabilitation     Barriers to Discharge        Equipment Recommendations  None recommended by PT    Recommendations for Other Services    Frequency Min 3X/week   Plan Discharge plan remains appropriate;Frequency remains appropriate    Precautions / Restrictions Precautions Precautions: Fall Precaution Comments: abd incision , colostomy   Pertinent Vitals/Pain C/o6/10, RN to get meds. sats 96% RA for ambulation. HR 60    Mobility  Bed Mobility Supine to Sit: HOB elevated;3: Mod assist Details for Bed Mobility Assistance: Pt used rail to roll then pulled up on PT arm. Transfers Sit to Stand: 1: +2 Total assist;From bed;With upper extremity assist Sit to Stand: Patient Percentage: 80% Stand to Sit: 4: Min guard;To bed;With upper extremity assist Ambulation/Gait Ambulation/Gait Assistance: 1: +2 Total assist Ambulation/Gait: Patient Percentage: 80% Ambulation Distance (Feet): 180 Feet Assistive device: Rolling walker Ambulation/Gait Assistance Details: cues for posture. Gait Pattern: Step-through pattern;Trunk flexed Gait velocity: decreased    Exercises     PT Diagnosis:    PT Problem List:   PT Treatment Interventions:     PT Goals Acute Rehab PT Goals Pt will go Supine/Side to Sit: with supervision PT Goal: Supine/Side to Sit - Progress: Progressing toward goal Pt will go Sit to Stand: with supervision PT Goal:  Sit to Stand - Progress: Progressing toward goal Pt will go Stand to Sit: with supervision PT Goal: Stand to Sit - Progress: Progressing toward goal Pt will Ambulate: 51 - 150 feet;with supervision;with least restrictive assistive device PT Goal: Ambulate - Progress: Progressing toward goal  Visit Information  Last PT Received On: 03/11/12 Assistance Needed: +2    Subjective Data  Subjective: Don't pull on me. let me do it.   Cognition  Overall Cognitive Status: Appears within functional limits for tasks assessed/performed Area of Impairment: Safety/judgement Arousal/Alertness: Awake/alert Behavior During Session: Restless    Balance  Static Sitting Balance Static Sitting - Balance Support: Bilateral upper extremity supported Static Sitting - Level of Assistance: 5: Stand by assistance  End of Session PT - End of Session Activity Tolerance: Patient tolerated treatment well Patient left: in bed;with call bell/phone within reach;with bed alarm set Nurse Communication:  (RN observed)   GP     Vincent [name] 03/11/2012, 12:17 PM

## 2012-03-11 NOTE — Progress Notes (Signed)
[facility] PROGRESS NOTE  Vincent [name] ZOX:[id number] DOB: 12-07-1932 DOA: 03/01/2012 PCP: Vincent Dials, MD  Brief narrative: 78 year old male with previous history of rectal carcinoma (diagnosed in 2011) status post surgical resection and colostomy, radiation and chemotherapy, recurrent carcinoma with nodal metastases noted on CT abdomen/pelvis. Patient was admitted for small bowel obstruction on 03/01/12, and initially treated conservatively.  Patient underwent exploratory laparoscopy which was converted to open enterolysis of adhesions with oversew of enterotomy on 03/06/12.  His post-operative course was complicated by hypotension requiring pressor support.  PCCM was consulted.  His condition stabilized with IVF and the pressors were rapidly weaned.    Assessment/Plan: Principal Problem:  *SBO (small bowel obstruction) /  Nausea & vomiting  Failed conservative therapy with bowel rest, NG decompression.    Underwent exploratory laparoscopy which was converted to open enterolysis of adhesions with oversew of enterotomy on 03/06/12.  Diet advanced to low fiber per surgery.  Continue to encourage increased mobility. Active Problems:  Rectal carcinoma  Seen by Dr. Clelia [name] on 03/07/12.  On FOLFIRI for salvage therapy, on hold until he recovers from his surgery.  Abdominal pain  Secondary to SBO, surgical pain.  Pain medication as needed.  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  Monitor.  PAF (paroxysmal atrial fibrillation) on chronic coumadin  Received 2 units of FFP prior to surgery, now back on coumadin.  Required a Cardizem drip for rate control.  Rate now controlled on digoxin with metoprolol PRN.  DM (diabetes mellitus)  CBGs 102-156 on moderate scale SSI Q 6 hours.  Change to SSI Q AC and HS.  AAA (abdominal aortic aneurysm) without rupture  4.1 by 4.4 cm on CT 03/01/12: appears unchanged.  Maintain good BP control.  Hypotension  Developed hypotension  post-operatively on 03/07/12 requiring IVF and pressor support.  No evidence of adrenal insufficiency.  BP stable now.  Hypokalemia  Monitor and replace as needed.  Elevated brain natriuretic peptide (BNP) level / chronic diastolic CHF  Grade I diastolic dysfunction noted on Echo done 02/09/10.  Monitor fluid balance closely.  No evidence of clinically significant CHF.  Protein calorie malnutrition  Seen by dietician 03/09/12.  Now on low fiber diet.  Normocytic anemia / Leukopenia  Likely the sequelae of prior chemotherapy.  Monitor.   Code Status: DNR Family Communication: Updated at bedside. Disposition Plan: Home versus SNF depending on progress.   Medical Consultants:  Dr. Levy [name], PCCM.  Dr. Glenna [name], Surgery.  Other Consultants:  Physical therapy: HH PT; Intermittent supervision vs. SNF if progress is slow.  Anti-infectives:  Mefoxin 03/05/12--->03/06/12  HPI/Subjective: Mr. Vincent [name] states his pain is largely controlled with medicine.  No dyspnea. Happy his diet was advanced.  Concerned about wound.    Objective: Filed Vitals:   03/10/12 1800 03/10/12 2035 03/11/12 0500 03/11/12 0802  BP: 130/64 122/66 138/70   Pulse: 104 79 61   Temp:  97.5 F (36.4 C) 98.7 F (37.1 C)   TempSrc:  Oral Oral   Resp: 23 18 24    Height:      Weight:      SpO2: 95% 97% 97% 97%    Intake/Output Summary (Last 24 hours) at 03/11/12 1020 Last data filed at 03/11/12 1000  Gross per 24 hour  Intake   1310 ml  Output    870 ml  Net    440 ml    Exam: Gen:  NAD Cardiovascular:  HSIR, tachy Respiratory:  Lungs diminished at  the bases Gastrointestinal:  Abdomen soft, NT/ND, tender at surgical site. Extremities:  No C/E/C  Data Reviewed: Basic Metabolic Panel:  Lab 03/11/12 8119 03/10/12 0453 03/09/12 1230 03/09/12 0541 03/08/12 1030 03/08/12 0030  NA 134* 135 -- 132* 130* 131*  K 3.5 3.3* -- -- -- --  CL 103 102 -- 98 95* 98  CO2 25 24 -- 26 29 30     GLUCOSE 111* 108* -- 117* 146* 101*  BUN 8 12 -- 13 15 17   CREATININE 0.81 0.88 -- 0.88 0.86 0.97  CALCIUM 7.8* 7.9* -- 8.3* 8.2* 8.1*  MG -- 1.6 1.8 -- -- 1.5  PHOS -- -- -- -- -- 3.1   GFR Estimated Creatinine Clearance: 82.6 ml/min (by C-G formula based on Cr of 0.81). Liver Function Tests:  Lab 03/08/12 0030  AST 8  ALT 6  ALKPHOS 58  BILITOT 0.4  PROT 4.5*  ALBUMIN 1.8*    Lab 03/08/12 0030  LIPASE 7*  AMYLASE --   Coagulation profile  Lab 03/11/12 0615 03/10/12 0453 03/09/12 0541 03/08/12 0500 03/07/12 1100  INR 2.30* 2.47* 2.26* 2.05* 1.71*  PROTIME -- -- -- -- --    CBC:  Lab 03/11/12 0615 03/10/12 0453 03/09/12 0541 03/08/12 1030 03/08/12 0030 03/07/12 0403  WBC 2.6* 1.7* 3.2* 6.5 4.0 --  NEUTROABS 1.5* 0.9* 2.2 -- 3.3 6.5  HGB 8.0* 8.2* 8.5* 8.3* 7.6* --  HCT 24.1* 24.1* 25.2* 24.7* 22.8* --  MCV 90.9 90.9 91.3 90.8 91.6 --  PLT 183 175 177 199 151 --   Cardiac Enzymes:  Lab 03/08/12 1800 03/08/12 1555 03/08/12 1300  CKTOTAL -- -- --  CKMB -- -- --  CKMBINDEX -- -- --  TROPONINI <0.30 <0.30 <0.30   BNP (last 3 results)  Basename 03/08/12 0030  PROBNP 2257.0*   CBG:  Lab 03/11/12 0621 03/10/12 2358 03/10/12 1712 03/10/12 1140 03/10/12 0533  GLUCAP 109* 124* 122* 156* 102*   Microbiology Recent Results (from the past 240 hour(s))  SURGICAL PCR SCREEN     Status: Normal   Collection Time   03/04/12  1:31 PM      Component Value Range Status Comment   MRSA, PCR NEGATIVE  NEGATIVE Final    Staphylococcus aureus NEGATIVE  NEGATIVE Final      Procedures and Diagnostic Studies:  Ct Abdomen Pelvis W Contrast 03/01/2012 IMPRESSION:  1.  Study is positive for small bowel obstruction with the transition point in the right lower quadrant.  Obstruction appears to be due to adhesions. 2.  No change in appearance of recurrent/residual rectal carcinoma since the most recent CT. 3.  Unchanged 4.4 cm abdominal aortic aneurysm.   Original Report  Authenticated By: Bernadene Bell. D'ALESSIO, M.D.     Dg Chest Port 1 View 03/08/2012 IMPRESSION: No pneumothorax following right subclavian line placement. Interval development of right pleural effusion with atelectasis versus consolidation at right base.   Original Report Authenticated By: Ulyses Southward, M.D.     Dg Abd 2 Views 03/04/2012  IMPRESSION: Partial small bowel obstruction, likely mildly increased.  No free intraperitoneal air or other acute complication.   Original Report Authenticated By: Jeronimo Greaves, M.D.     Dg Abd Acute W/chest 03/01/2012 IMPRESSION: No active cardiopulmonary disease.  Mild small bowel dilatation with air-fluid levels, improved from the  prior study.  This may represent early small bowel obstruction and continued follow-up is suggested.   Original Report Authenticated By: Camelia Phenes, M.D.     Dg  Abd Portable 1v 03/08/2012 IMPRESSION: Post-operative ileus.   Original Report Authenticated By: Hulan Saas, M.D.     Dg Abd Portable 2v 03/03/2012 IMPRESSION: Small bowel obstruction, grossly unchanged.  Enteric tube with its tip in the gastric cardia and side port in the distal esophagus.  Consider advancement.   Original Report Authenticated By: Charline Bills, M.D.     Scheduled Meds:    . ipratropium  0.5 mg Nebulization QID   And  . albuterol  2.5 mg Nebulization QID  . antiseptic oral rinse  15 mL Mouth Rinse q12n4p  . aspirin  81 mg Oral Daily  . chlorhexidine  15 mL Mouth Rinse BID  . digoxin  0.125 mg Oral Daily  . Fluticasone-Salmeterol  1 puff Inhalation BID  . heparin lock flush  500 Units Intracatheter Q30 days  . insulin aspart  0-15 Units Subcutaneous Q6H  . multivitamins with iron  1 tablet Oral Q supper  . pantoprazole  40 mg Oral Daily  . sodium chloride  500 mL Intravenous Once  . Warfarin - Pharmacist Dosing Inpatient   Does not apply q1800  . [DISCONTINUED] pantoprazole (PROTONIX) IV  40 mg Intravenous Q24H   Continuous Infusions:      . 0.9 % NaCl with KCl 40 mEq / L 100 mL/hr at 03/11/12 0233  . [DISCONTINUED] sodium chloride 125 mL/hr at 03/10/12 1129    Time spent: 35 minutes.   LOS: 10 days   Halley Kincer  [facility] Pager [phone].  If 8PM-8AM, please contact night-coverage at [email or web address], password [facility] 03/11/2012, 10:20 AM

## 2012-03-11 NOTE — Progress Notes (Signed)
General Surgery [facility]  Patient seen and examined.  Much improved.  Taking po diet.  Likely home soon.  Velora Heckler, MD, [facility] Office: [phone]

## 2012-03-12 DIAGNOSIS — S31109A Unspecified open wound of abdominal wall, unspecified quadrant without penetration into peritoneal cavity, initial encounter: Secondary | ICD-10-CM | POA: Diagnosis not present

## 2012-03-12 LAB — CBC WITH DIFFERENTIAL/PLATELET
Basophils Absolute: 0 10*3/uL (ref 0.0–0.1)
Eosinophils Absolute: 0.1 10*3/uL (ref 0.0–0.7)
Eosinophils Relative: 2 % (ref 0–5)
HCT: 24.7 % — ABNORMAL LOW (ref 39.0–52.0)
Lymphocytes Relative: 10 % — ABNORMAL LOW (ref 12–46)
Lymphs Abs: 0.4 10*3/uL — ABNORMAL LOW (ref 0.7–4.0)
MCH: 29.7 pg (ref 26.0–34.0)
MCV: 91.8 fL (ref 78.0–100.0)
Monocytes Absolute: 0.7 10*3/uL (ref 0.1–1.0)
RDW: 17.6 % — ABNORMAL HIGH (ref 11.5–15.5)
WBC: 3.7 10*3/uL — ABNORMAL LOW (ref 4.0–10.5)

## 2012-03-12 LAB — BASIC METABOLIC PANEL
CO2: 25 mEq/L (ref 19–32)
Chloride: 100 mEq/L (ref 96–112)
Creatinine, Ser: 0.89 mg/dL (ref 0.50–1.35)
Glucose, Bld: 153 mg/dL — ABNORMAL HIGH (ref 70–99)

## 2012-03-12 LAB — GLUCOSE, CAPILLARY
Glucose-Capillary: 127 mg/dL — ABNORMAL HIGH (ref 70–99)
Glucose-Capillary: 139 mg/dL — ABNORMAL HIGH (ref 70–99)
Glucose-Capillary: 126 mg/dL — ABNORMAL HIGH (ref 70–99)
Glucose-Capillary: 138 mg/dL — ABNORMAL HIGH (ref 70–99)

## 2012-03-12 MED ORDER — WARFARIN SODIUM 3 MG PO TABS
3.0000 mg | ORAL_TABLET | Freq: Once | ORAL | Status: AC
  Administered 2012-03-12: 3 mg via ORAL
  Filled 2012-03-12: qty 1

## 2012-03-12 NOTE — Progress Notes (Signed)
Patient ID: Vincent [name], male   DOB: 12/31/1932, 78 y.o.   MRN: [id number]  General Surgery - [facility] - Progress Note  POD# 6  Subjective: Patient feels "blah" this morning.  Mild pain.  Large gas in ostomy bag.  Tolerating regular diet.  Objective: Vital signs in last 24 hours: Temp:  [98.2 F (36.8 C)-98.5 F (36.9 C)] 98.5 F (36.9 C) (11/09 0440) Pulse Rate:  [57-98] 98  (11/09 0440) Resp:  [18-22] 18  (11/09 0440) BP: (103-119)/(43-68) 119/68 mmHg (11/09 0440) SpO2:  [91 %-100 %] 91 % (11/09 0803) Last BM Date: 03/11/12  Intake/Output from previous day: 11/08 0701 - 11/09 0700 In: 1640 [P.O.:840; I.V.:800] Out: 1500 [Urine:1500]  Exam: HEENT - clear, not icteric Neck - soft Chest - coarse bilaterally Cor - RRR, no murmur Abd - soft, mild distension; BS present; large flatus in bag; midline wound with wet to dry dressing intact Ext - no significant edema Neuro - grossly intact, no focal deficits  Lab Results:   Basename 03/12/12 0530 03/11/12 0615  WBC 3.7* 2.6*  HGB 8.0* 8.0*  HCT 24.7* 24.1*  PLT 218 183     Basename 03/12/12 0530 03/11/12 0615  NA 130* 134*  K 4.1 3.5  CL 100 103  CO2 25 25  GLUCOSE 153* 111*  BUN 8 8  CREATININE 0.89 0.81  CALCIUM 8.1* 7.8*    Studies/Results: No results found.  Assessment / Plan: 1.  Status post ex lap with lysis of adhesions  Ileus resolved - on regular diet  Open midline wound with dressing changes  Ostomy care 2.  Unresectable rectal carcinoma without distant metastasis  Followed by oncology  Appreciate medical service management.  Will continue to follow and monitor wound care.  Velora Heckler, MD, [facility] Office: [phone]  03/12/2012

## 2012-03-12 NOTE — Progress Notes (Addendum)
[facility] PROGRESS NOTE  BOYAN CHEEVER ZOX:[id number] DOB: [birth year] DOA: 03/01/2012 PCP: Aura Dials, MD  Brief narrative: 78 year old male with previous history of rectal carcinoma (diagnosed in 2011) status post surgical resection and colostomy, radiation and chemotherapy, recurrent carcinoma with nodal metastases noted on CT abdomen/pelvis. Patient was admitted for small bowel obstruction on 03/01/12, and initially treated conservatively.  Patient underwent exploratory laparoscopy which was converted to open enterolysis of adhesions with oversew of enterotomy on 03/06/12.  His post-operative course was complicated by hypotension requiring pressor support.  PCCM was consulted.  His condition stabilized with IVF and the pressors were rapidly weaned.    Assessment/Plan: Principal Problem:  *SBO (small bowel obstruction) /  Nausea & vomiting  Failed conservative therapy with bowel rest, NG decompression.    Underwent exploratory laparoscopy which was converted to open enterolysis of adhesions with oversew of enterotomy on 03/06/12.  Diet advanced to low fiber per surgery.  Continue to encourage increased mobility. Active Problems:  Open wound of abdominal wall without complication  Continue wet to dry dressing changes.  F/U wound cultures.  Rectal carcinoma  Seen by Dr. Clelia Croft on 03/07/12.  On FOLFIRI for salvage therapy, on hold until he recovers from his surgery.  Abdominal pain  Secondary to SBO, surgical pain.  Pain medication as needed.  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  Monitor.  PAF (paroxysmal atrial fibrillation) on chronic coumadin  Received 2 units of FFP prior to surgery, now back on coumadin.  Rate controlled on digoxin with metoprolol PRN.  DM (diabetes mellitus)  CBGs 109-166 on moderate scale SSI Q AC and HS.  AAA (abdominal aortic aneurysm) without rupture  4.1 by 4.4 cm on CT 03/01/12: appears unchanged.  Maintain good BP control.  Hypotension  Developed hypotension post-operatively on 03/07/12 requiring IVF and pressor support.  No evidence of adrenal insufficiency.  BP stable now.  Hypokalemia  Monitor and replace as needed.  Elevated brain natriuretic peptide (BNP) level / chronic diastolic CHF  Grade I diastolic dysfunction noted on Echo done 02/09/10.  Monitor fluid balance closely.  No evidence of clinically significant CHF.  Protein calorie malnutrition  Seen by dietician 03/09/12.  Now on low fiber diet.  Normocytic anemia / Leukopenia  Likely the sequelae of prior chemotherapy.  Monitor.   Code Status: DNR Family Communication: None at bedside. Disposition Plan: Home versus SNF depending on progress.   Medical Consultants:  Dr. Levy Pupa, PCCM.  Dr. Glenna Fellows, Surgery.  Other Consultants:  Physical therapy: HH PT; 24 hour supervision vs. SNF if progress is slow.  Anti-infectives:  Mefoxin 03/05/12--->03/06/12  HPI/Subjective: Mr. Jerman states his pain is largely controlled with medicine.  No dyspnea. Tolerating solid foods.  Had some nausea yesterday, but no vomiting.  Walked in halls yesterday.  Objective: Filed Vitals:   03/11/12 1642 03/11/12 2130 03/12/12 0440 03/12/12 0803  BP:  114/67 119/68   Pulse:  87 98   Temp:  98.4 F (36.9 C) 98.5 F (36.9 C)   TempSrc:  Oral Oral   Resp:  20 18   Height:      Weight:      SpO2: 98% 97% 96% 91%    Intake/Output Summary (Last 24 hours) at 03/12/12 0853 Last data filed at 03/11/12 2100  Gross per 24 hour  Intake   1400 ml  Output   1200 ml  Net    200 ml    Exam: Gen:  NAD Cardiovascular:  HSIR, tachy Respiratory:  Lungs diminished at the bases Gastrointestinal:  Abdomen soft, NT/ND, tender at surgical site. Extremities:  No C/E/C  Data Reviewed: Basic Metabolic Panel:  Lab 03/12/12 1610 03/11/12 0615 03/10/12 0453 03/09/12 1230 03/09/12 0541 03/08/12 1030 03/08/12 0030  NA 130* 134* 135 -- 132* 130* --  K  4.1 3.5 -- -- -- -- --  CL 100 103 102 -- 98 95* --  CO2 25 25 24  -- 26 29 --  GLUCOSE 153* 111* 108* -- 117* 146* --  BUN 8 8 12  -- 13 15 --  CREATININE 0.89 0.81 0.88 -- 0.88 0.86 --  CALCIUM 8.1* 7.8* 7.9* -- 8.3* 8.2* --  MG -- -- 1.6 1.8 -- -- 1.5  PHOS -- -- -- -- -- -- 3.1   GFR Estimated Creatinine Clearance: 75.2 ml/min (by C-G formula based on Cr of 0.89). Liver Function Tests:  Lab 03/08/12 0030  AST 8  ALT 6  ALKPHOS 58  BILITOT 0.4  PROT 4.5*  ALBUMIN 1.8*    Lab 03/08/12 0030  LIPASE 7*  AMYLASE --   Coagulation profile  Lab 03/12/12 0530 03/11/12 0615 03/10/12 0453 03/09/12 0541 03/08/12 0500  INR 1.96* 2.30* 2.47* 2.26* 2.05*  PROTIME -- -- -- -- --    CBC:  Lab 03/12/12 0530 03/11/12 0615 03/10/12 0453 03/09/12 0541 03/08/12 1030 03/08/12 0030  WBC 3.7* 2.6* 1.7* 3.2* 6.5 --  NEUTROABS 2.6 1.5* 0.9* 2.2 -- 3.3  HGB 8.0* 8.0* 8.2* 8.5* 8.3* --  HCT 24.7* 24.1* 24.1* 25.2* 24.7* --  MCV 91.8 90.9 90.9 91.3 90.8 --  PLT 218 183 175 177 199 --   Cardiac Enzymes:  Lab 03/08/12 1800 03/08/12 1555 03/08/12 1300  CKTOTAL -- -- --  CKMB -- -- --  CKMBINDEX -- -- --  TROPONINI <0.30 <0.30 <0.30   BNP (last 3 results)  Basename 03/08/12 0030  PROBNP 2257.0*   CBG:  Lab 03/12/12 0755 03/11/12 2134 03/11/12 1656 03/11/12 1139 03/11/12 0621  GLUCAP 127* 157* 133* 166* 109*   Microbiology Recent Results (from the past 240 hour(s))  SURGICAL PCR SCREEN     Status: Normal   Collection Time   03/04/12  1:31 PM      Component Value Range Status Comment   MRSA, PCR NEGATIVE  NEGATIVE Final    Staphylococcus aureus NEGATIVE  NEGATIVE Final   WOUND CULTURE     Status: Normal (Preliminary result)   Collection Time   03/11/12  9:17 AM      Component Value Range Status Comment   Specimen Description WOUND ABDOMINAL   Final    Special Requests Immunocompromised   Final    Gram Stain     Final    Value: NO WBC SEEN     RARE SQUAMOUS EPITHELIAL CELLS  PRESENT     ABUNDANT GRAM POSITIVE COCCI IN PAIRS     FEW GRAM POSITIVE RODS     RARE GRAM NEGATIVE RODS   Culture Culture reincubated for better growth   Final    Report Status PENDING   Incomplete      Procedures and Diagnostic Studies:  Ct Abdomen Pelvis W Contrast 03/01/2012 IMPRESSION:  1.  Study is positive for small bowel obstruction with the transition point in the right lower quadrant.  Obstruction appears to be due to adhesions. 2.  No change in appearance of recurrent/residual rectal carcinoma since the most recent CT. 3.  Unchanged 4.4 cm abdominal aortic aneurysm.  Original Report Authenticated By: Bernadene Bell. D'ALESSIO, M.D.     Dg Chest Port 1 View 03/08/2012 IMPRESSION: No pneumothorax following right subclavian line placement. Interval development of right pleural effusion with atelectasis versus consolidation at right base.   Original Report Authenticated By: Ulyses Southward, M.D.     Dg Abd 2 Views 03/04/2012  IMPRESSION: Partial small bowel obstruction, likely mildly increased.  No free intraperitoneal air or other acute complication.   Original Report Authenticated By: Jeronimo Greaves, M.D.     Dg Abd Acute W/chest 03/01/2012 IMPRESSION: No active cardiopulmonary disease.  Mild small bowel dilatation with air-fluid levels, improved from the  prior study.  This may represent early small bowel obstruction and continued follow-up is suggested.   Original Report Authenticated By: Camelia Phenes, M.D.     Dg Abd Portable 1v 03/08/2012 IMPRESSION: Post-operative ileus.   Original Report Authenticated By: Hulan Saas, M.D.     Dg Abd Portable 2v 03/03/2012 IMPRESSION: Small bowel obstruction, grossly unchanged.  Enteric tube with its tip in the gastric cardia and side port in the distal esophagus.  Consider advancement.   Original Report Authenticated By: Charline Bills, M.D.     Scheduled Meds:    . ipratropium  0.5 mg Nebulization QID   And  . albuterol  2.5 mg Nebulization  QID  . antiseptic oral rinse  15 mL Mouth Rinse q12n4p  . aspirin  81 mg Oral Daily  . chlorhexidine  15 mL Mouth Rinse BID  . digoxin  0.125 mg Oral Daily  . Fluticasone-Salmeterol  1 puff Inhalation BID  . heparin lock flush  500 Units Intracatheter Q30 days  . insulin aspart  0-15 Units Subcutaneous TID WC  . insulin aspart  0-5 Units Subcutaneous QHS  . multivitamins with iron  1 tablet Oral Q supper  . pantoprazole  40 mg Oral Daily  . sodium chloride  500 mL Intravenous Once  . [COMPLETED] warfarin  2 mg Oral ONCE-1800  . warfarin  3 mg Oral ONCE-1800  . Warfarin - Pharmacist Dosing Inpatient   Does not apply q1800  . [DISCONTINUED] insulin aspart  0-15 Units Subcutaneous Q6H  . [DISCONTINUED] pantoprazole (PROTONIX) IV  40 mg Intravenous Q24H   Continuous Infusions:    . 0.9 % NaCl with KCl 40 mEq / L 100 mL/hr at 03/11/12 2249    Time spent: 25 minutes.   LOS: 11 days   Rip Hawes  [facility] Pager [phone].  If 8PM-8AM, please contact night-coverage at [email or web address], password [facility] 03/12/2012, 8:53 AM

## 2012-03-12 NOTE — Progress Notes (Signed)
ANTICOAGULATION CONSULT NOTE  Pharmacy Consult for Coumadin Indication: Hx A.fib  No Known Allergies  Patient Measurements: Height: 5\' 11"  (180.3 cm) Weight: 192 lb 3.9 oz (87.2 kg) IBW/kg (Calculated) : 75.3   Vital Signs: Temp: 98.5 F (36.9 C) (11/09 0440) Temp src: Oral (11/09 0440) BP: 119/68 mmHg (11/09 0440) Pulse Rate: 98  (11/09 0440)  Labs:  Basename 03/12/12 0530 03/11/12 0615 03/10/12 0453  HGB 8.0* 8.0* --  HCT 24.7* 24.1* 24.1*  PLT 218 183 175  APTT -- -- --  LABPROT 21.6* 24.3* 25.6*  INR 1.96* 2.30* 2.47*  HEPARINUNFRC -- -- --  CREATININE 0.89 0.81 0.88  CKTOTAL -- -- --  CKMB -- -- --  TROPONINI -- -- --    Estimated Creatinine Clearance: 75.2 ml/min (by C-G formula based on Cr of 0.89).  Assessment:  78yo M admitted with SBO 10/29.  Underwent open surgery for SBO & LOA on 11/2.  On chronic Coumadin for hx A.fib.   Received warfarin 3mg  x1 on 11/4, placed on hold, then resumed with 2mg  x1 on 11/8.  Home dose = 5mg  daily except 2.5mg  M/F.  INR just below goal range today  H/H low but stable. No bleeding/complications reported.  Advanced diet to low fiber on 11/8. Patient says his appetite is improving.  Will continue to use lower warfarin dosing given that INR remained therapeutic for 4 days, despite only receiving one dose of warfarin during that time.   Goal of Therapy:  INR 2-3 Monitor platelets by anticoagulation protocol: Yes   Plan:   Warfarin 3mg  PO x1 at 18:00  F/u daily PT/INR & CBC.  Darrol Angel, PharmD Pager: [phone]  03/12/2012,8:26 AM.

## 2012-03-13 LAB — PROTIME-INR: Prothrombin Time: 20.4 seconds — ABNORMAL HIGH (ref 11.6–15.2)

## 2012-03-13 LAB — BASIC METABOLIC PANEL
CO2: 26 mEq/L (ref 19–32)
Calcium: 8.5 mg/dL (ref 8.4–10.5)
Creatinine, Ser: 0.84 mg/dL (ref 0.50–1.35)
GFR calc Af Amer: >90 mL/min (ref >90)

## 2012-03-13 LAB — CBC WITH DIFFERENTIAL/PLATELET
Basophils Absolute: 0 10*3/uL (ref 0.0–0.1)
Basophils Relative: 0 % (ref 0–1)
Eosinophils Relative: 1 % (ref 0–5)
Lymphocytes Relative: 10 % — ABNORMAL LOW (ref 12–46)
MCHC: 33.3 g/dL (ref 30.0–36.0)
MCV: 90.8 fL (ref 78.0–100.0)
Platelets: 271 10*3/uL (ref 150–400)
RDW: 17.7 % — ABNORMAL HIGH (ref 11.5–15.5)
WBC: 5.6 10*3/uL (ref 4.0–10.5)

## 2012-03-13 LAB — GLUCOSE, CAPILLARY

## 2012-03-13 LAB — WOUND CULTURE: Gram Stain: NONE SEEN

## 2012-03-13 MED ORDER — INSULIN ASPART 100 UNIT/ML ~~LOC~~ SOLN
0.0000 [IU] | Freq: Three times a day (TID) | SUBCUTANEOUS | Status: DC
  Administered 2012-03-13: 3 [IU] via SUBCUTANEOUS
  Administered 2012-03-14: 2 [IU] via SUBCUTANEOUS
  Administered 2012-03-14: 3 [IU] via SUBCUTANEOUS

## 2012-03-13 MED ORDER — INSULIN ASPART 100 UNIT/ML ~~LOC~~ SOLN: 0.0000 [IU] | Freq: Every day | SUBCUTANEOUS | Status: DC

## 2012-03-13 MED ORDER — ASPIRIN 81 MG PO CHEW
81.0000 mg | CHEWABLE_TABLET | Freq: Every day | ORAL | Status: DC
  Administered 2012-03-14: 81 mg via ORAL
  Filled 2012-03-13: qty 1

## 2012-03-13 MED ORDER — HYDROMORPHONE HCL 2 MG PO TABS
1.0000 mg | ORAL_TABLET | ORAL | Status: DC | PRN
  Administered 2012-03-13 – 2012-03-14 (×2): 2 mg via ORAL
  Filled 2012-03-13 (×2): qty 1

## 2012-03-13 MED ORDER — WARFARIN SODIUM 4 MG PO TABS
4.0000 mg | ORAL_TABLET | Freq: Once | ORAL | Status: DC
  Filled 2012-03-13: qty 1

## 2012-03-13 MED ORDER — PANTOPRAZOLE SODIUM 40 MG PO TBEC
40.0000 mg | DELAYED_RELEASE_TABLET | Freq: Every day | ORAL | Status: DC
  Administered 2012-03-14: 40 mg via ORAL
  Filled 2012-03-13: qty 1

## 2012-03-13 MED ORDER — NITROGLYCERIN 0.4 MG SL SUBL: 0.4000 mg | SUBLINGUAL_TABLET | SUBLINGUAL | Status: DC | PRN

## 2012-03-13 MED ORDER — WARFARIN SODIUM 4 MG PO TABS
4.0000 mg | ORAL_TABLET | Freq: Once | ORAL | Status: AC
  Administered 2012-03-13: 4 mg via ORAL
  Filled 2012-03-13: qty 1

## 2012-03-13 MED ORDER — FLUTICASONE-SALMETEROL 500-50 MCG/DOSE IN AEPB
1.0000 | INHALATION_SPRAY | Freq: Two times a day (BID) | RESPIRATORY_TRACT | Status: DC
  Administered 2012-03-13 – 2012-03-14 (×2): 1 via RESPIRATORY_TRACT

## 2012-03-13 MED ORDER — TAB-A-VITE/IRON PO TABS
1.0000 | ORAL_TABLET | Freq: Every day | ORAL | Status: DC
  Filled 2012-03-13 (×2): qty 1

## 2012-03-13 NOTE — Progress Notes (Signed)
8 Days Post-Op  Subjective: No complaints.  Objective: Vital signs in last 24 hours: Temp:  [98.1 F (36.7 C)-98.3 F (36.8 C)] 98.3 F (36.8 C) (11/10 0549) Pulse Rate:  [68-88] 76  (11/10 0549) Resp:  [18-20] 18  (11/10 0549) BP: (114-130)/(49-65) 115/65 mmHg (11/10 0549) SpO2:  [91 %-97 %] 91 % (11/10 0802) Last BM Date: 03/12/12  Intake/Output from previous day: 11/09 0701 - 11/10 0700 In: 1527 [P.O.:240; I.V.:1287] Out: 2000 [Urine:1375; Stool:625] Intake/Output this shift:    PE: General- In NAD Abdomen-soft, open area of midline wound is clean, ostomy functioning.  Lab Results:   Basename 03/13/12 0515 03/12/12 0530  WBC 5.6 3.7*  HGB 8.9* 8.0*  HCT 26.7* 24.7*  PLT 271 218   BMET  Basename 03/13/12 0515 03/12/12 0530  NA 130* 130*  K 4.2 4.1  CL 97 100  CO2 26 25  GLUCOSE 137* 153*  BUN 8 8  CREATININE 0.84 0.89  CALCIUM 8.5 8.1*   PT/INR  Basename 03/13/12 0515 03/12/12 0530  LABPROT 20.4* 21.6*  INR 1.82* 1.96*   Comprehensive Metabolic Panel:    Component Value Date/Time   NA 130* 03/13/2012 0515   NA 138 02/24/2012 0803   NA 133 04/09/2011 0838   K 4.2 03/13/2012 0515   K 3.7 02/24/2012 0803   K 4.6 04/09/2011 0838   CL 97 03/13/2012 0515   CL 102 02/24/2012 0803   CL 98 04/09/2011 0838   CO2 26 03/13/2012 0515   CO2 26 02/24/2012 0803   CO2 31 04/09/2011 0838   BUN 8 03/13/2012 0515   BUN 17.0 02/24/2012 0803   BUN 18 04/09/2011 0838   CREATININE 0.84 03/13/2012 0515   CREATININE 1.0 02/24/2012 0803   CREATININE 1.3* 04/09/2011 0838   GLUCOSE 137* 03/13/2012 0515   GLUCOSE 145* 02/24/2012 0803   GLUCOSE 136* 04/09/2011 0838   CALCIUM 8.5 03/13/2012 0515   CALCIUM 9.0 02/24/2012 0803   CALCIUM 8.8 04/09/2011 0838   AST 8 03/08/2012 0030   AST 12 02/24/2012 0803   AST 18 04/09/2011 0838   ALT 6 03/08/2012 0030   ALT 15 02/24/2012 0803   ALKPHOS 58 03/08/2012 0030   ALKPHOS 78 02/24/2012 0803   ALKPHOS 67 04/09/2011 0838   BILITOT  0.4 03/08/2012 0030   BILITOT 0.50 02/24/2012 0803   BILITOT 0.70 04/09/2011 0838   PROT 4.5* 03/08/2012 0030   PROT 6.0* 02/24/2012 0803   PROT 6.5 04/09/2011 0838   ALBUMIN 1.8* 03/08/2012 0030   ALBUMIN 3.0* 02/24/2012 0803     Studies/Results: No results found.  Anti-infectives: Anti-infectives     Start     Dose/Rate Route Frequency Ordered Stop   03/05/12 1800   cefOXitin (MEFOXIN) 1 g in dextrose 5 % 50 mL IVPB        1 g 100 mL/hr over 30 Minutes Intravenous 3 times per day 03/05/12 1318 03/06/12 1428          Assessment 1. Status post ex lap with lysis of adhesions-on regular diet; ostomy working well  2.Open midline wound with dressing changes  3. Unresectable rectal carcinoma without distant metastasis  Followed by oncology     LOS: 12 days   Plan: Continue current dressing changes.  Remove staples prior to discharge.   Dionna Wiedemann J 03/13/2012

## 2012-03-13 NOTE — Progress Notes (Signed)
ANTICOAGULATION CONSULT NOTE  Pharmacy Consult for Coumadin Indication: Hx A.fib  No Known Allergies  Patient Measurements: Height: 5\' 11"  (180.3 cm) Weight: 192 lb 3.9 oz (87.2 kg) IBW/kg (Calculated) : 75.3   Vital Signs: Temp: 98.3 F (36.8 C) (11/10 0549) Temp src: Oral (11/10 0549) BP: 115/65 mmHg (11/10 0549) Pulse Rate: 76  (11/10 0549)  Labs:  Basename 03/13/12 0515 03/12/12 0530 03/11/12 0615  HGB 8.9* 8.0* --  HCT 26.7* 24.7* 24.1*  PLT 271 218 183  APTT -- -- --  LABPROT 20.4* 21.6* 24.3*  INR 1.82* 1.96* 2.30*  HEPARINUNFRC -- -- --  CREATININE 0.84 0.89 0.81  CKTOTAL -- -- --  CKMB -- -- --  TROPONINI -- -- --    Estimated Creatinine Clearance: 79.7 ml/min (by C-G formula based on Cr of 0.84).  Assessment:  78yo M admitted with SBO 10/29.  Underwent open surgery for SBO & LOA on 11/2.  On chronic Coumadin for hx A.fib.   Received warfarin 3mg  x1 on 11/4, placed on hold, then resumed with 2mg  x1 on 11/8.  Home dose = 5mg  daily except 2.5mg  M/F.  INR remains subtherapeutic today as expected after warfarin being held for several days.  H/H low but stable. No bleeding/complications reported.  Advanced diet to low fiber on 11/8. Patient says his appetite is improving.   Using cautious warfarin dosing given the fact that INR previously remained therapeutic for 4 days, despite only receiving one dose of warfarin during that time  Goal of Therapy:  INR 2-3 Monitor platelets by anticoagulation protocol: Yes   Plan:   Warfarin 4 mg PO x1 at 18:00  F/u daily PT/INR & CBC.  Darrol Angel, PharmD Pager: [phone]  03/13/2012,7:38 AM.

## 2012-03-13 NOTE — Progress Notes (Signed)
Spoke with patient regarding wearing his home cpap at night.  Pt stated he has not been wearing it while in the hospital, and that he breathes better without it.  Pt was offered a hospital unit, but he still refused at this time.  Pt advised that RT available all night and encouraged him to call should he change his mind.

## 2012-03-13 NOTE — Progress Notes (Signed)
[facility] PROGRESS NOTE  Vincent [name] YNW:[id number] DOB: Jun 09, 1932 DOA: 03/01/2012 PCP: Aura Dials, MD  Brief narrative: 78 year old male with previous history of rectal carcinoma (diagnosed in 2011) status post surgical resection and colostomy, radiation and chemotherapy, recurrent carcinoma with nodal metastases noted on CT abdomen/pelvis. Patient was admitted for small bowel obstruction on 03/01/12, and initially treated conservatively.  Patient underwent exploratory laparoscopy which was converted to open enterolysis of adhesions with oversew of enterotomy on 03/06/12.  His post-operative course was complicated by hypotension requiring pressor support.  PCCM was consulted.  His condition stabilized with IVF and the pressors were rapidly weaned.    Assessment/Plan: Principal Problem:  *SBO (small bowel obstruction) /  Nausea & vomiting  Failed conservative therapy with bowel rest, NG decompression.    Underwent exploratory laparoscopy which was converted to open enterolysis of adhesions with oversew of enterotomy on 03/06/12.  Diet advanced to low fiber per surgery.  Continue to encourage increased mobility. Active Problems:  Open wound of abdominal wall without complication  Continue wet to dry dressing changes.  Wound cultures polymicrobial.  Rectal carcinoma  Seen by Dr. Clelia Croft on 03/07/12.  On FOLFIRI for salvage therapy, on hold until he recovers from his surgery.  Abdominal pain  Secondary to SBO, surgical pain.  Pain medication as needed.  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  Monitor.  PAF (paroxysmal atrial fibrillation) on chronic coumadin  Received 2 units of FFP prior to surgery, now back on coumadin.  Rate controlled on digoxin with metoprolol PRN.  DM (diabetes mellitus)  CBGs 126-139 on moderate scale SSI Q AC and HS.  AAA (abdominal aortic aneurysm) without rupture  4.1 by 4.4 cm on CT 03/01/12: appears unchanged.  Maintain good BP  control.  Hypotension  Developed hypotension post-operatively on 03/07/12 requiring IVF and pressor support.  No evidence of adrenal insufficiency.  BP stable now.  Hypokalemia  Monitor and replace as needed.  Elevated brain natriuretic peptide (BNP) level / chronic diastolic CHF  Grade I diastolic dysfunction noted on Echo done 02/09/10.  Monitor fluid balance closely.  No evidence of clinically significant CHF.  Protein calorie malnutrition  Seen by dietician 03/09/12.  Now on low fiber diet.  Normocytic anemia / Leukopenia  Likely the sequelae of prior chemotherapy.  Monitor.   Code Status: DNR Family Communication: None at bedside. Disposition Plan: Home versus SNF depending on progress.   Medical Consultants:  Dr. Levy Pupa, PCCM.  Dr. Glenna Fellows, Surgery.  Other Consultants:  Physical therapy: HH PT; 24 hour supervision vs. SNF if progress is slow.  Anti-infectives:  Mefoxin 03/05/12--->03/06/12  HPI/Subjective: Vincent [name] feels well.  No dyspnea.  Appetite good.  Increasing mobility.  Pain controlled.  Objective: Filed Vitals:   03/12/12 2050 03/12/12 2155 03/13/12 0549 03/13/12 0802  BP:  130/49 115/65   Pulse:  68 76   Temp:  98.2 F (36.8 C) 98.3 F (36.8 C)   TempSrc:  Oral Oral   Resp:  18 18   Height:      Weight:      SpO2: 93% 97% 95% 91%    Intake/Output Summary (Last 24 hours) at 03/13/12 0901 Last data filed at 03/13/12 0549  Gross per 24 hour  Intake   1527 ml  Output   1500 ml  Net     27 ml    Exam: Gen:  NAD Cardiovascular:  HSIR, tachy Respiratory:  Lungs diminished at the bases with a  few scattered rhonchi Gastrointestinal:  Abdomen soft, NT/ND, tender at surgical site. Extremities:  No C/E/C  Data Reviewed: Basic Metabolic Panel:  Lab 03/13/12 1191 03/12/12 0530 03/11/12 0615 03/10/12 0453 03/09/12 1230 03/09/12 0541 03/08/12 0030  NA 130* 130* 134* 135 -- 132* --  K 4.2 4.1 -- -- -- -- --  CL 97 100 103 102  -- 98 --  CO2 26 25 25 24  -- 26 --  GLUCOSE 137* 153* 111* 108* -- 117* --  BUN 8 8 8 12  -- 13 --  CREATININE 0.84 0.89 0.81 0.88 -- 0.88 --  CALCIUM 8.5 8.1* 7.8* 7.9* -- 8.3* --  MG -- -- -- 1.6 1.8 -- 1.5  PHOS -- -- -- -- -- -- 3.1   GFR Estimated Creatinine Clearance: 79.7 ml/min (by C-G formula based on Cr of 0.84). Liver Function Tests:  Lab 03/08/12 0030  AST 8  ALT 6  ALKPHOS 58  BILITOT 0.4  PROT 4.5*  ALBUMIN 1.8*    Lab 03/08/12 0030  LIPASE 7*  AMYLASE --   Coagulation profile  Lab 03/13/12 0515 03/12/12 0530 03/11/12 0615 03/10/12 0453 03/09/12 0541  INR 1.82* 1.96* 2.30* 2.47* 2.26*  PROTIME -- -- -- -- --    CBC:  Lab 03/13/12 0515 03/12/12 0530 03/11/12 0615 03/10/12 0453 03/09/12 0541  WBC 5.6 3.7* 2.6* 1.7* 3.2*  NEUTROABS 4.1 2.6 1.5* 0.9* 2.2  HGB 8.9* 8.0* 8.0* 8.2* 8.5*  HCT 26.7* 24.7* 24.1* 24.1* 25.2*  MCV 90.8 91.8 90.9 90.9 91.3  PLT 271 218 183 175 177   Cardiac Enzymes:  Lab 03/08/12 1800 03/08/12 1555 03/08/12 1300  CKTOTAL -- -- --  CKMB -- -- --  CKMBINDEX -- -- --  TROPONINI <0.30 <0.30 <0.30   BNP (last 3 results)  Basename 03/13/12 0515 03/08/12 0030  PROBNP 2539.0* 2257.0*   CBG:  Lab 03/13/12 0744 03/12/12 2125 03/12/12 1559 03/12/12 1200 03/12/12 0755  GLUCAP 126* 138* 126* 139* 127*   Microbiology Recent Results (from the past 240 hour(s))  SURGICAL PCR SCREEN     Status: Normal   Collection Time   03/04/12  1:31 PM      Component Value Range Status Comment   MRSA, PCR NEGATIVE  NEGATIVE Final    Staphylococcus aureus NEGATIVE  NEGATIVE Final   WOUND CULTURE     Status: Normal   Collection Time   03/11/12  9:17 AM      Component Value Range Status Comment   Specimen Description WOUND ABDOMINAL   Final    Special Requests Immunocompromised   Final    Gram Stain     Final    Value: NO WBC SEEN     RARE SQUAMOUS EPITHELIAL CELLS PRESENT     ABUNDANT GRAM POSITIVE COCCI IN PAIRS     FEW GRAM POSITIVE RODS      RARE GRAM NEGATIVE RODS   Culture     Final    Value: MULTIPLE ORGANISMS PRESENT, NONE PREDOMINANT     Note: NO STAPHYLOCOCCUS AUREUS ISOLATED NO GROUP A STREP (S.PYOGENES) ISOLATED   Report Status 03/13/2012 FINAL   Final      Procedures and Diagnostic Studies:  Ct Abdomen Pelvis W Contrast 03/01/2012 IMPRESSION:  1.  Study is positive for small bowel obstruction with the transition point in the right lower quadrant.  Obstruction appears to be due to adhesions. 2.  No change in appearance of recurrent/residual rectal carcinoma since the most recent CT. 3.  Unchanged 4.4 cm abdominal aortic aneurysm.   Original Report Authenticated By: Bernadene Bell. D'ALESSIO, M.D.     Dg Chest Port 1 View 03/08/2012 IMPRESSION: No pneumothorax following right subclavian line placement. Interval development of right pleural effusion with atelectasis versus consolidation at right base.   Original Report Authenticated By: Ulyses Southward, M.D.     Dg Abd 2 Views 03/04/2012  IMPRESSION: Partial small bowel obstruction, likely mildly increased.  No free intraperitoneal air or other acute complication.   Original Report Authenticated By: Jeronimo Greaves, M.D.     Dg Abd Acute W/chest 03/01/2012 IMPRESSION: No active cardiopulmonary disease.  Mild small bowel dilatation with air-fluid levels, improved from the  prior study.  This may represent early small bowel obstruction and continued follow-up is suggested.   Original Report Authenticated By: Camelia Phenes, M.D.     Dg Abd Portable 1v 03/08/2012 IMPRESSION: Post-operative ileus.   Original Report Authenticated By: Hulan Saas, M.D.     Dg Abd Portable 2v 03/03/2012 IMPRESSION: Small bowel obstruction, grossly unchanged.  Enteric tube with its tip in the gastric cardia and side port in the distal esophagus.  Consider advancement.   Original Report Authenticated By: Charline Bills, M.D.     Scheduled Meds:    . ipratropium  0.5 mg Nebulization QID   And  .  albuterol  2.5 mg Nebulization QID  . antiseptic oral rinse  15 mL Mouth Rinse q12n4p  . aspirin  81 mg Oral Daily  . chlorhexidine  15 mL Mouth Rinse BID  . digoxin  0.125 mg Oral Daily  . Fluticasone-Salmeterol  1 puff Inhalation BID  . heparin lock flush  500 Units Intracatheter Q30 days  . insulin aspart  0-15 Units Subcutaneous TID WC  . insulin aspart  0-5 Units Subcutaneous QHS  . multivitamins with iron  1 tablet Oral Q supper  . pantoprazole  40 mg Oral Daily  . sodium chloride  500 mL Intravenous Once  . [COMPLETED] warfarin  3 mg Oral ONCE-1800  . warfarin  4 mg Oral ONCE-1800  . Warfarin - Pharmacist Dosing Inpatient   Does not apply q1800   Continuous Infusions:    . 0.9 % NaCl with KCl 40 mEq / L 20 mL/hr (03/12/12 2136)    Time spent: 25 minutes.   LOS: 12 days   RAMA,CHRISTINA  [facility] Pager [phone].  If 8PM-8AM, please contact night-coverage at [email or web address], password [facility] 03/13/2012, 9:01 AM

## 2012-03-14 LAB — BASIC METABOLIC PANEL
BUN: 9 mg/dL (ref 6–23)
CO2: 27 mEq/L (ref 19–32)
Chloride: 96 mEq/L (ref 96–112)
GFR calc non Af Amer: 80 mL/min — ABNORMAL LOW (ref >90)
Glucose, Bld: 152 mg/dL — ABNORMAL HIGH (ref 70–99)
Potassium: 4 mEq/L (ref 3.5–5.1)

## 2012-03-14 LAB — CBC WITH DIFFERENTIAL/PLATELET
Basophils Relative: 0 % (ref 0–1)
Eosinophils Absolute: 0.1 10*3/uL (ref 0.0–0.7)
HCT: 24.4 % — ABNORMAL LOW (ref 39.0–52.0)
Hemoglobin: 8.1 g/dL — ABNORMAL LOW (ref 13.0–17.0)
Lymphs Abs: 0.6 10*3/uL — ABNORMAL LOW (ref 0.7–4.0)
MCH: 30.5 pg (ref 26.0–34.0)
MCHC: 33.2 g/dL (ref 30.0–36.0)
MCV: 91.7 fL (ref 78.0–100.0)
Monocytes Absolute: 0.7 10*3/uL (ref 0.1–1.0)
Monocytes Relative: 13 % — ABNORMAL HIGH (ref 3–12)

## 2012-03-14 LAB — GLUCOSE, CAPILLARY: Glucose-Capillary: 151 mg/dL — ABNORMAL HIGH (ref 70–99)

## 2012-03-14 MED ORDER — OXYCODONE HCL 5 MG PO TABS: 5.0000 mg | ORAL_TABLET | Freq: Four times a day (QID) | ORAL | Status: DC | PRN

## 2012-03-14 MED ORDER — HYDROMORPHONE HCL 4 MG PO TABS: 4.0000 mg | ORAL_TABLET | Freq: Four times a day (QID) | ORAL | Status: DC | PRN

## 2012-03-14 MED ORDER — ONDANSETRON HCL 4 MG PO TABS: 4.0000 mg | ORAL_TABLET | Freq: Four times a day (QID) | ORAL | Status: DC | PRN

## 2012-03-14 MED ORDER — WARFARIN SODIUM 3 MG PO TABS
3.0000 mg | ORAL_TABLET | Freq: Once | ORAL | Status: AC
  Administered 2012-03-14: 3 mg via ORAL
  Filled 2012-03-14: qty 1

## 2012-03-14 NOTE — Progress Notes (Signed)
Clinical Social Worker reviewed chart. Noted PT recommending home with home health services. Clinical Social Worker spoke to RN who stated home health arranged and no Clinical Social Worker needs identified. Per chart, pt for discharge home today. No further social work needs identified at this time. Clinical Social Worker signing off.   Jacklynn Lewis, MSW, LCSWA  Clinical Social Work [phone]

## 2012-03-14 NOTE — Progress Notes (Signed)
9 Days Post-Op  Subjective: Feels good, worried about getting home and getting ready for next round of chemo.  No other complaints  Objective: Vital signs in last 24 hours: Temp:  [97.8 F (36.6 C)-98 F (36.7 C)] 98 F (36.7 C) (11/11 0500) Pulse Rate:  [79-99] 79  (11/11 0500) Resp:  [16-18] 16  (11/11 0500) BP: (108-128)/(53-85) 122/53 mmHg (11/11 0500) SpO2:  [91 %-98 %] 93 % (11/11 0500) Last BM Date: 03/14/12  700 ml/PO recorded, 325 ml/ostomy, afebrile, VSS, Na is low, H/H still low, INR 2.13  Intake/Output from previous day: 11/10 0701 - 11/11 0700 In: 1156.3 [P.O.:700; I.V.:456.3] Out: 1475 [Urine:1150; Stool:325] Intake/Output this shift: Total I/O In: -  Out: 300 [Urine:300]  General appearance: alert, cooperative, no distress and happiest I have seen him in the hospital for this admission. GI: soft, wound is healing well. Ostomy with stool and gas in it. Open area.packed. Incision/Wound: he had some drainage, that I think is just fat necrosis,  I probed wound and took out a 2-3 more staples from the midline going cephalad, the skin line is pretty solid.  There is some undermining that I  but could not separate the skin above it without hurting him.  I  Have instructed the nurses in packing the open area: by packing an open 4 x 4 cephalad, and at the base.  You can do it with just one open 4 x 4.  Lab Results:   [facility] 03/14/12 0427 03/13/12 0515  WBC 5.4 5.6  HGB 8.1* 8.9*  HCT 24.4* 26.7*  PLT 272 271    BMET  Basename 03/14/12 0427 03/13/12 0515  NA 129* 130*  K 4.0 4.2  CL 96 97  CO2 27 26  GLUCOSE 152* 137*  BUN 9 8  CREATININE 0.92 0.84  CALCIUM 8.3* 8.5   PT/INR  Basename 03/14/12 0427 03/13/12 0515  LABPROT 22.9* 20.4*  INR 2.13* 1.82*     Lab 03/08/12 0030  AST 8  ALT 6  ALKPHOS 58  BILITOT 0.4  PROT 4.5*  ALBUMIN 1.8*     Lipase     Component Value Date/Time   LIPASE 7* 03/08/2012 0030     Studies/Results: No results  found.  Medications:    . ipratropium  0.5 mg Nebulization QID   And  . albuterol  2.5 mg Nebulization QID  . antiseptic oral rinse  15 mL Mouth Rinse q12n4p  . aspirin  81 mg Oral Daily  . chlorhexidine  15 mL Mouth Rinse BID  . digoxin  0.125 mg Oral Daily  . Fluticasone-Salmeterol  1 puff Inhalation BID  . heparin lock flush  500 Units Intracatheter Q30 days  . insulin aspart  0-15 Units Subcutaneous TID WC  . insulin aspart  0-5 Units Subcutaneous QHS  . multivitamins with iron  1 tablet Oral Q supper  . pantoprazole  40 mg Oral Daily  . sodium chloride  500 mL Intravenous Once  . [COMPLETED] warfarin  4 mg Oral ONCE-1800  . Warfarin - Pharmacist Dosing Inpatient   Does not apply q1800  . [DISCONTINUED] aspirin  81 mg Oral Daily  . [DISCONTINUED] Fluticasone-Salmeterol  1 puff Inhalation BID  . [DISCONTINUED] insulin aspart  0-15 Units Subcutaneous TID WC  . [DISCONTINUED] insulin aspart  0-5 Units Subcutaneous QHS  . [DISCONTINUED] multivitamins with iron  1 tablet Oral Q supper  . [DISCONTINUED] pantoprazole  40 mg Oral Daily  . [DISCONTINUED] warfarin  4 mg Oral  ONCE-1800    Assessment/Plan EXPLORATORY LAPAROTOMY, LAPAROSCOPIC LYSIS OF ADHESIONS - D. Ezzard Standing - 03/05/2012  - For SBO (small bowel obstruction) secondary to adhesive band - no obvious intra-peritoneal cancer   resolution of ileus, on a low fiber diet S/P rectosigmoid resection with radiation and chemotherapy, for salvage Dr. Clelia Croft.  - Advanced Rectal carcinoma T4N2 - Dr. Earl Gala oncologist, Primary resection by Dr. Nat Christen - 02/13/2010  PAF (paroxysmal atrial fibrillation) - on chronic coumadin/DR. Allyson Sabal. Heparin is SQ, restarted coumadin 03/07/12  HTN  AAA  DVT proph - SQ hep  Sleep Apnea  Tobacco use 60 years   Plan:  He is doing well from our standpoint.  He will need home health to help with dressing changes, and the family will need to be taught how to do it.  It sounds like wife does not drive  much and grandson/daughter are the main transportation providers.  He plans on Chemo this week.  I will leave dressing instructions and follow up with Dr. Gerrit Friends in about 10 day.  Culture/gram stain showed multiple organisms from Friday culture. I have left instructions for home health dressing change.  He also needs PT and a face to face, but will defer that to Medicine.  LOS: 13 days    Ingeborg Fite 03/14/2012

## 2012-03-14 NOTE — Progress Notes (Signed)
ANTICOAGULATION CONSULT NOTE  Pharmacy Consult for Coumadin Indication: Hx A.fib  No Known Allergies  Patient Measurements: Height: 5\' 11"  (180.3 cm) Weight: 192 lb 3.9 oz (87.2 kg) IBW/kg (Calculated) : 75.3   Vital Signs: Temp: 98 F (36.7 C) (11/11 0500) Temp src: Oral (11/11 0500) BP: 122/53 mmHg (11/11 0500) Pulse Rate: 79  (11/11 0500)  Labs:  Basename 03/14/12 0427 03/13/12 0515 03/12/12 0530  HGB 8.1* 8.9* --  HCT 24.4* 26.7* 24.7*  PLT 272 271 218  APTT -- -- --  LABPROT 22.9* 20.4* 21.6*  INR 2.13* 1.82* 1.96*  HEPARINUNFRC -- -- --  CREATININE 0.92 0.84 0.89  CKTOTAL -- -- --  CKMB -- -- --  TROPONINI -- -- --    Estimated Creatinine Clearance: 72.8 ml/min (by C-G formula based on Cr of 0.92).  Assessment:  78yo M admitted with SBO 10/29.  Underwent open surgery for SBO & LOA on 11/2.  On chronic Coumadin for hx A.fib, INR is therapeutic today and trending up  Received warfarin 3mg  x1 on 11/4, placed on hold, then resumed with 2mg  x1 on 11/8.  Home dose = 5mg  daily except 2.5mg  M/F.  H/H low but stable. No bleeding/complications reported.  Advanced diet to low fiber on 11/8. Patient says his appetite is improving.   Using cautious warfarin dosing given the fact that INR previously remained therapeutic for 4 days, despite only receiving one dose of warfarin during that time  Goal of Therapy:  INR 2-3 Monitor platelets by anticoagulation protocol: Yes   Plan:   Warfarin 3 mg PO x1 at 18:00  F/u daily PT/INR & CBC.  BorgerdingLoma Messing PharmD Pager #: [phone] 8:34 AM 03/14/2012

## 2012-03-14 NOTE — Progress Notes (Signed)
Patient evaluated for long-term disease management services with [facility] Care Management Program as a benefit of his [employer]. Spoke with Mr Vincent [name] at bedside to explain [facility] Care Management services. Explained to him that he will receive a post discharge transition of care call and will be evaluated for  monthly home visits for assessments and for education. Noted that these services do not interfere with the apparent services he will have with [facility] (per inpatient RNCM). Left contact information and [facility] Care Management pamphlet at bedside. Mr Gieske appreciative of visit.   Raiford Noble, RN,BSN, MSN-[facility] Liaison [phone]

## 2012-03-14 NOTE — Discharge Summary (Addendum)
Physician Discharge Summary  Vincent [name] ZOX:[id number] DOB: 1932/12/28 DOA: 03/01/2012  PCP: Aura Dials, MD  Admit date: 03/01/2012 Discharge date: 03/14/2012  Recommendations for Outpatient Follow-up:  1. Recommend close follow up with PCP for coumadin management, check of electrolytes, DM management. 2. Home health RN ordered to help teach family how to change abdominal wound dressings. 3. Home health PT set up to help with deconditioning.  Discharge Diagnoses:   Principal Problem:  *SBO (small bowel obstruction) status post laparoscopy converted to open enterolysis of adhesions with oversew of enterotomy.  Active Problems:   Rectal carcinoma   Abdominal pain   Hyponatremia   PAF (paroxysmal atrial fibrillation)   DM (diabetes mellitus)   Nausea & vomiting   AAA (abdominal aortic aneurysm) without rupture   Hypotension   Hypokalemia   Elevated brain natriuretic peptide (BNP) level   Protein calorie malnutrition   Normocytic anemia   Leukopenia   Chronic diastolic CHF (congestive heart failure)   Open wound of abdominal wall without complication   Discharge Condition: Improved.  Diet recommendation: Carbohydrate modified.  History of present illness:  78 year old male with previous history of rectal carcinoma (diagnosed in 2011) status post surgical resection and colostomy, radiation and chemotherapy, recurrent carcinoma with nodal metastases noted on CT abdomen/pelvis. Patient was admitted for small bowel obstruction on 03/01/12.  Hospital Course by problem:  Principal Problem:  *SBO (small bowel obstruction) / Nausea & vomiting  Failed conservative therapy with bowel rest, NG decompression.  Underwent exploratory laparoscopy which was converted to open enterolysis of adhesions with oversew of enterotomy on 03/06/12.  Diet advanced to low fiber which he was tolerating well prior to discharge.   Continue to encourage increased mobility, PT set up  at home. Active Problems:  Open wound of abdominal wall without complication  Continue wet to dry dressing changes BID. F/U with Dr. Gerrit Friends in 2 weeks.  Wound cultures polymicrobial. Rectal carcinoma  Seen by Dr. Clelia Croft on 03/07/12. On FOLFIRI for salvage therapy, on hold until he recovers from his surgery. Abdominal pain  Secondary to SBO, surgical pain. Pain medication as needed. 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 Monitor. PAF (paroxysmal atrial fibrillation) on chronic coumadin  Received 2 units of FFP prior to surgery, now back on coumadin.  Rate controlled on digoxin with metoprolol PRN. DM (diabetes mellitus)  Resume home regimen at discharge. AAA (abdominal aortic aneurysm) without rupture  4.1 by 4.4 cm on CT 03/01/12: appears unchanged. Maintain good BP control. Hypotension  Developed hypotension post-operatively on 03/07/12 requiring IVF and pressor support. No evidence of adrenal insufficiency.  BP stable now. Hypokalemia  Monitored and replaced as needed. Elevated brain natriuretic peptide (BNP) level / chronic diastolic CHF  Grade I diastolic dysfunction noted on Echo done 02/09/10.  Monitored fluid balance closely. No evidence of clinically significant CHF during his hospital stay. Protein calorie malnutrition  Seen by dietician 03/09/12. Now on low fiber diet. Normocytic anemia / Leukopenia  Likely the sequelae of prior chemotherapy. Monitor.  Procedures:  03/14/12: Laparoscopy converted to open enterolysis of adhesions with oversew of enterotomy.   Medical Consultants:  Dr. Levy Pupa, PCCM.  Dr. Glenna Fellows, Surgery.  Discharge Exam: Filed Vitals:   03/14/12 0500  BP: 122/53  Pulse: 79  Temp: 98 F (36.7 C)  Resp: 16   Filed Vitals:   03/13/12 1430 03/13/12 2005 03/13/12 2115 03/14/12 0500  BP: 108/53  128/85 122/53  Pulse: 96  99 79  Temp:  97.9 F (36.6 C)  97.8 F (36.6 C) 98 F (36.7 C)  TempSrc: Oral  Oral Oral  Resp: 18   16 16   Height:      Weight:      SpO2: 98% 93% 92% 93%    Gen:  NAD Cardiovascular:  RRR, No M/R/G Respiratory: Lungs CTAB Gastrointestinal: Abdomen soft, NT/ND with normal active bowel sounds. Extremities: No C/E/C   Discharge Instructions  Discharge Orders    Future Appointments: Provider: Department: Dept Phone: Center:   04/06/2012 11:00 AM [facility] [phone] None     Future Orders Please Complete By Expires   Diet Carb Modified      Face-to-face encounter      Comments:   I RAMA,CHRISTINA certify that this patient is under my care and that I, or a nurse practitioner or physician's assistant working with me, had a face-to-face encounter that meets the physician face-to-face encounter requirements with this patient on 03/14/2012.   Questions: Responses:   The encounter with the patient was in whole, or in part, for the following medical condition, which is the primary reason for home health care SBO status post colostomy, rectal CA, deconditioning   I certify that, based on my findings, the following services are medically necessary home health services Nursing    Physical therapy   My clinical findings support the need for the above services High Risk for rehospitalization   Further, I certify that my clinical findings support that this patient is homebound due to: Unable to leave home safely without assistance   To provide the following care/treatments PT    RN   Increase activity slowly      Walk with assistance      Call MD for:  temperature >100.4      Call MD for:  persistant nausea and vomiting      Call MD for:  severe uncontrolled pain      Home Health      Scheduling Instructions:   Help with ostomy and dressing change BID. Wet to dry w NS, with single 4 x 4 open and packed using sterile applicator stick cephalad and into base of wound. To provide the following care/treatments: RN   Questions: Responses:   To  provide the following care/treatments PT    RN   Discharge wound care:      Comments:   Wet to dry w NS, with single 4 x 4 open and packed using sterile applicator stick cephalad and into base of wound.       Medication List     As of 03/14/2012  8:53 AM    STOP taking these medications         digoxin 0.125 MG tablet   Commonly known as: LANOXIN      TAKE these medications         albuterol-ipratropium 18-103 MCG/ACT inhaler   Commonly known as: COMBIVENT   Inhale 1 puff into the lungs every 4 (four) hours as needed. WHEEZING      aspirin EC 81 MG tablet   Take 81 mg by mouth daily.      Fluticasone-Salmeterol 500-50 MCG/DOSE Aepb   Commonly known as: ADVAIR   Inhale 1 puff into the lungs every 12 (twelve) hours.      HYDROmorphone 4 MG tablet   Commonly known as: DILAUDID   Take 1 tablet (4 mg total) by mouth every 6 (six) hours as needed.  Pain      metFORMIN 500 MG tablet   Commonly known as: GLUCOPHAGE   Take 500 mg by mouth daily with breakfast.      nitroGLYCERIN 0.3 MG SL tablet   Commonly known as: NITROSTAT   Place 0.3 mg under the tongue every 5 (five) minutes as needed.      ondansetron 4 MG tablet   Commonly known as: ZOFRAN   Take 1 tablet (4 mg total) by mouth every 6 (six) hours as needed for nausea.      oxyCODONE 5 MG immediate release tablet   Commonly known as: Oxy IR/ROXICODONE   Take 1 tablet (5 mg total) by mouth every 6 (six) hours as needed. Breakthrough pain      pantoprazole 40 MG tablet   Commonly known as: PROTONIX   Take 1 tablet (40 mg total) by mouth daily at 12 noon.      simvastatin 40 MG tablet   Commonly known as: ZOCOR   Take 1 tablet (40 mg total) by mouth at bedtime.      temazepam 15 MG capsule   Commonly known as: RESTORIL   Take 15 mg by mouth at bedtime as needed.      warfarin 5 MG tablet   Commonly known as: COUMADIN   Take 0.5 tablets (2.5 mg total) by mouth daily. Pt takes 5 mg every day except on Monday  and Friday pt takes 2.5mg  ( 1/2 tab of 5 mg tablet)      zolpidem 5 MG tablet   Commonly known as: AMBIEN   Take 1 tablet (5 mg total) by mouth at bedtime as needed for sleep (insomnia).           Follow-up Information    Follow up with Velora Heckler, MD. Schedule an appointment as soon as possible for a visit in 10 days. (Pack open wound with opened 4 x 4, wet with normal saline.  Use an applicator stick to pack cephlad and into the base of the open wound.  Dressing changes twice a day and as needed.)    Contact information:   [address] [place] [address] [phone]       Follow up with Aura Dials, MD. Schedule an appointment as soon as possible for a visit in 1 week.   Contact information:   [address] [place] [address] [phone]           The results of significant diagnostics from this hospitalization (including imaging, microbiology, ancillary and laboratory) are listed below for reference.    Significant Diagnostic Studies: Ct Abdomen Pelvis W Contrast 03/01/2012 IMPRESSION: 1. Study is positive for small bowel obstruction with the transition point in the right lower quadrant. Obstruction appears to be due to adhesions. 2. No change in appearance of recurrent/residual rectal carcinoma since the most recent CT. 3. Unchanged 4.4 cm abdominal aortic aneurysm. Original Report Authenticated By: Bernadene Bell. D'ALESSIO, M.D.  Dg Chest Port 1 View 03/08/2012 IMPRESSION: No pneumothorax following right subclavian line placement. Interval development of right pleural effusion with atelectasis versus consolidation at right base. Original Report Authenticated By: Ulyses Southward, M.D.  Dg Abd 2 Views 03/04/2012 IMPRESSION: Partial small bowel obstruction, likely mildly increased. No free intraperitoneal air or other acute complication. Original Report Authenticated By: Jeronimo Greaves, M.D.  Dg Abd Acute W/chest 03/01/2012 IMPRESSION: No active cardiopulmonary  disease. Mild small bowel dilatation with air-fluid levels, improved from the prior study. This may represent  early small bowel obstruction and continued follow-up is suggested. Original Report Authenticated By: Camelia Phenes, M.D.  Dg Abd Portable 1v 03/08/2012 IMPRESSION: Post-operative ileus. Original Report Authenticated By: Hulan Saas, M.D.  Dg Abd Portable 2v 03/03/2012 IMPRESSION: Small bowel obstruction, grossly unchanged. Enteric tube with its tip in the gastric cardia and side port in the distal esophagus. Consider advancement. Original Report Authenticated By: Charline Bills, M.D.    Microbiology: Recent Results (from the past 240 hour(s))  SURGICAL PCR SCREEN     Status: Normal   Collection Time   03/04/12  1:31 PM      Component Value Range Status Comment   MRSA, PCR NEGATIVE  NEGATIVE Final    Staphylococcus aureus NEGATIVE  NEGATIVE Final   WOUND CULTURE     Status: Normal   Collection Time   03/11/12  9:17 AM      Component Value Range Status Comment   Specimen Description WOUND ABDOMINAL   Final    Special Requests Immunocompromised   Final    Gram Stain     Final    Value: NO WBC SEEN     RARE SQUAMOUS EPITHELIAL CELLS PRESENT     ABUNDANT GRAM POSITIVE COCCI IN PAIRS     FEW GRAM POSITIVE RODS     RARE GRAM NEGATIVE RODS   Culture     Final    Value: MULTIPLE ORGANISMS PRESENT, NONE PREDOMINANT     Note: NO STAPHYLOCOCCUS AUREUS ISOLATED NO GROUP A STREP (S.PYOGENES) ISOLATED   Report Status 03/13/2012 FINAL   Final      Labs: Basic Metabolic Panel:  Lab 03/14/12 1610 03/13/12 0515 03/12/12 0530 03/11/12 0615 03/10/12 0453 03/09/12 1230 03/08/12 0030  NA 129* 130* 130* 134* 135 -- --  K 4.0 4.2 4.1 3.5 3.3* -- --  CL 96 97 100 103 102 -- --  CO2 27 26 25 25 24  -- --  GLUCOSE 152* 137* 153* 111* 108* -- --  BUN 9 8 8 8 12  -- --  CREATININE 0.92 0.84 0.89 0.81 0.88 -- --  CALCIUM 8.3* 8.5 8.1* 7.8* 7.9* -- --  MG -- -- -- -- 1.6 1.8 1.5  PHOS -- -- --  -- -- -- 3.1   Liver Function Tests:  Lab 03/08/12 0030  AST 8  ALT 6  ALKPHOS 58  BILITOT 0.4  PROT 4.5*  ALBUMIN 1.8*    Lab 03/08/12 0030  LIPASE 7*  AMYLASE --   CBC:  Lab 03/14/12 0427 03/13/12 0515 03/12/12 0530 03/11/12 0615 03/10/12 0453  WBC 5.4 5.6 3.7* 2.6* 1.7*  NEUTROABS 4.1 4.1 2.6 1.5* 0.9*  HGB 8.1* 8.9* 8.0* 8.0* 8.2*  HCT 24.4* 26.7* 24.7* 24.1* 24.1*  MCV 91.7 90.8 91.8 90.9 90.9  PLT 272 271 218 183 175   Cardiac Enzymes:  Lab 03/08/12 1800 03/08/12 1555 03/08/12 1300  CKTOTAL -- -- --  CKMB -- -- --  CKMBINDEX -- -- --  TROPONINI <0.30 <0.30 <0.30   BNP: BNP (last 3 results)  Basename 03/13/12 0515 03/08/12 0030  PROBNP 2539.0* 2257.0*   CBG:  Lab 03/14/12 0719 03/13/12 2116 03/13/12 1744 03/13/12 1204 03/13/12 0744  GLUCAP 151* 95 177* 116* 126*    Time coordinating discharge: 40 minutes.  Signed:  RAMA,CHRISTINA  Pager [phone] [facility] 03/14/2012, 8:53 AM

## 2012-03-14 NOTE — Progress Notes (Signed)
I have seen and examined the patient and agree with the assessment and plans.  Lovie Zarling A. Amberleigh Gerken  MD, FACS  

## 2012-03-14 NOTE — Progress Notes (Signed)
Pt stable at time of discharge. No updates to status from shift assessment. Educated pt's son on proper wound care. Reviewed with pt and son discharge education including appointments, medications, and home health visits.

## 2012-03-15 ENCOUNTER — Telehealth (INDEPENDENT_AMBULATORY_CARE_PROVIDER_SITE_OTHER): Payer: Self-pay

## 2012-03-15 NOTE — Telephone Encounter (Signed)
PT for Vincent [name] called stating pt needs f/u appt in 2 weeks with surgeon. I advised him Dr Ezzard Standing did pts abd surgery and I will send msg to his nurse to contact pt with appt date. Vincent [name] will be following pt for his home PT and Home Health needs.

## 2012-03-16 ENCOUNTER — Telehealth (INDEPENDENT_AMBULATORY_CARE_PROVIDER_SITE_OTHER): Payer: Self-pay

## 2012-03-16 NOTE — Telephone Encounter (Signed)
Spoke with wife B. Deremer informed her of patients appt for 03/24/12 @ 5 p ask her to be here 15 min early.

## 2012-03-24 ENCOUNTER — Encounter (INDEPENDENT_AMBULATORY_CARE_PROVIDER_SITE_OTHER): Payer: Self-pay | Admitting: Surgery

## 2012-03-24 ENCOUNTER — Ambulatory Visit (INDEPENDENT_AMBULATORY_CARE_PROVIDER_SITE_OTHER): Payer: Medicare Other | Admitting: Surgery

## 2012-03-24 VITALS — BP 138/64 | HR 76 | Temp 97.5°F | Resp 18 | Ht 71.0 in | Wt 184.4 lb

## 2012-03-24 DIAGNOSIS — R5381 Other malaise: Secondary | ICD-10-CM

## 2012-03-24 DIAGNOSIS — R531 Weakness: Secondary | ICD-10-CM

## 2012-03-24 DIAGNOSIS — C20 Malignant neoplasm of rectum: Secondary | ICD-10-CM

## 2012-03-24 DIAGNOSIS — K56609 Unspecified intestinal obstruction, unspecified as to partial versus complete obstruction: Secondary | ICD-10-CM

## 2012-03-24 NOTE — Progress Notes (Signed)
[facility]  Ovidio Kin, MD,  FACS [address]  [address] [place], [place]    [address] Phone:  [phone] FAX:  [phone]   Re:   SRITHIK QUIRIN DOB:   1932/09/22 MRN:   [id number]  ASSESSMENT AND PLAN: 1.  Small bowel obstruction  Enterolysis - D. Remiel Corti - 03/01/2012  He has done well from this surgery, except that he has an open wound.  Return to office in 2 weeks to check his wound.  2.  Rectal cancer.  Dr Gerrit Friends did primary resection and end colostomy on 02/13/2010.  He had 8/16 nodes positive.  He is followed by Dr. Elita Boone.  He has a pelvic recurrence of the rectal cancer. He is now on FOLFIRI for salvage purposes.  3.  On Coumadin for A.Fib 4. PAF (paroxysmal atrial fibrillation)   Has seen Dr. Erlene Quan  5. HTN (hypertension)  6. AAA (abdominal aortic aneurysm) without rupture   7.  Open wound  I opened it up further in the office today.  HHC is coming by daily and his wife is changing it daily.  HISTORY OF PRESENT ILLNESS: Chief Complaint  Patient presents with  . Routine Post Op    Small bowel obstruction 03/05/12    LUISALFREDO SAIZ is a 78 y.o. (DOB: 03/29/1933)  white male who is a patient of BOUSKA,Jamyra Zweig E, MD and comes to me today for post op follow up.  He is doing fairly well.  He does have drainage from his midline wound.  He is eating well.  And his colostomy if working well.  I am so late, that his family paged Dr. Gerrit Friends, who is the on call doctor, to see where the patient is.   PHYSICAL EXAM: BP 138/64  Pulse 76  Temp 97.5 F (36.4 C) (Temporal)  Resp 18  Ht 5\' 11"  (1.803 m)  Wt 184 lb 6 oz (83.632 kg)  BMI 25.72 kg/m2  General: WN older WM who is alert and generally healthy appearing.  HEENT: Normal. Pupils equal.  Neck: Supple. No mass.  No thyroid mass.    Lungs: Clear to auscultation and symmetric breath sounds. Heart:  RRR. No murmur or rub. Abdomen: Soft. No mass. No tenderness. Left mid abdomen  ostomy.       Mid line wound is open and draining.  But the wound is not opened enough.  While in the office, I infiltrated the wound with 8 cc 1% xylocaine and opened the wound from 3 cm to about 8 cm.  I removed a few sutures and debrided the wound.  DATA REVIEWED: Epic hospital notes   Ovidio Kin, MD, FACS Office:  [phone]

## 2012-04-04 ENCOUNTER — Telehealth: Payer: Self-pay | Admitting: Oncology

## 2012-04-04 NOTE — Telephone Encounter (Signed)
s.w. pt and advised him that per Dixie she will come and speak with the pt at nxt lab appt...to get pt back on track with infusions and appts.

## 2012-04-05 ENCOUNTER — Telehealth: Payer: Self-pay | Admitting: *Deleted

## 2012-04-05 ENCOUNTER — Telehealth: Payer: Self-pay | Admitting: Oncology

## 2012-04-05 ENCOUNTER — Other Ambulatory Visit: Payer: Self-pay | Admitting: Oncology

## 2012-04-05 NOTE — Telephone Encounter (Signed)
Per staff message and POF I have scheduled appt.  JMW  

## 2012-04-05 NOTE — Telephone Encounter (Signed)
s.w. pt and advised on 12.4.13 lab, est, and chemo....pt aware

## 2012-04-05 NOTE — Telephone Encounter (Signed)
Emailed michelle to add chemo...the patient aware

## 2012-04-06 ENCOUNTER — Encounter: Payer: Self-pay | Admitting: Oncology

## 2012-04-06 ENCOUNTER — Encounter (INDEPENDENT_AMBULATORY_CARE_PROVIDER_SITE_OTHER): Payer: Self-pay | Admitting: Surgery

## 2012-04-06 ENCOUNTER — Ambulatory Visit (HOSPITAL_BASED_OUTPATIENT_CLINIC_OR_DEPARTMENT_OTHER): Payer: Medicare Other

## 2012-04-06 ENCOUNTER — Ambulatory Visit (INDEPENDENT_AMBULATORY_CARE_PROVIDER_SITE_OTHER): Payer: Medicare Other | Admitting: Surgery

## 2012-04-06 ENCOUNTER — Other Ambulatory Visit: Payer: Medicare Other | Admitting: Lab

## 2012-04-06 ENCOUNTER — Other Ambulatory Visit (HOSPITAL_BASED_OUTPATIENT_CLINIC_OR_DEPARTMENT_OTHER): Payer: Medicare Other | Admitting: Lab

## 2012-04-06 ENCOUNTER — Ambulatory Visit (HOSPITAL_BASED_OUTPATIENT_CLINIC_OR_DEPARTMENT_OTHER): Payer: Medicare Other | Admitting: Oncology

## 2012-04-06 VITALS — BP 114/58 | HR 65 | Temp 97.3°F | Resp 20 | Ht 71.0 in | Wt 184.2 lb

## 2012-04-06 VITALS — BP 128/74 | HR 60 | Resp 20 | Ht 71.0 in | Wt 185.0 lb

## 2012-04-06 DIAGNOSIS — D649 Anemia, unspecified: Secondary | ICD-10-CM

## 2012-04-06 DIAGNOSIS — C20 Malignant neoplasm of rectum: Secondary | ICD-10-CM

## 2012-04-06 DIAGNOSIS — K56609 Unspecified intestinal obstruction, unspecified as to partial versus complete obstruction: Secondary | ICD-10-CM

## 2012-04-06 DIAGNOSIS — C772 Secondary and unspecified malignant neoplasm of intra-abdominal lymph nodes: Secondary | ICD-10-CM

## 2012-04-06 DIAGNOSIS — Z5111 Encounter for antineoplastic chemotherapy: Secondary | ICD-10-CM

## 2012-04-06 LAB — COMPREHENSIVE METABOLIC PANEL (CC13)
Albumin: 2.9 g/dL — ABNORMAL LOW (ref 3.5–5.0)
BUN: 16 mg/dL (ref 7.0–26.0)
Calcium: 8.7 mg/dL (ref 8.4–10.4)
Chloride: 102 mEq/L (ref 98–107)
Creatinine: 0.9 mg/dL (ref 0.7–1.3)
Glucose: 92 mg/dl (ref 70–99)
Potassium: 3.8 mEq/L (ref 3.5–5.1)

## 2012-04-06 LAB — CBC WITH DIFFERENTIAL/PLATELET
BASO%: 0.7 % (ref 0.0–2.0)
Basophils Absolute: 0.1 10*3/uL (ref 0.0–0.1)
HCT: 33.1 % — ABNORMAL LOW (ref 38.4–49.9)
HGB: 10.4 g/dL — ABNORMAL LOW (ref 13.0–17.1)
LYMPH%: 11.6 % — ABNORMAL LOW (ref 14.0–49.0)
MCH: 30.1 pg (ref 27.2–33.4)
MCHC: 31.4 g/dL — ABNORMAL LOW (ref 32.0–36.0)
MONO#: 0.7 10*3/uL (ref 0.1–0.9)
NEUT%: 76.5 % — ABNORMAL HIGH (ref 39.0–75.0)
Platelets: 207 10*3/uL (ref 140–400)
WBC: 8.1 10*3/uL (ref 4.0–10.3)
lymph#: 0.9 10*3/uL (ref 0.9–3.3)

## 2012-04-06 MED ORDER — LEUCOVORIN CALCIUM INJECTION 350 MG
300.0000 mg/m2 | Freq: Once | INTRAVENOUS | Status: AC
  Administered 2012-04-06: 634 mg via INTRAVENOUS
  Filled 2012-04-06: qty 31.7

## 2012-04-06 MED ORDER — ATROPINE SULFATE 0.4 MG/ML IJ SOLN
0.5000 mg | Freq: Once | INTRAMUSCULAR | Status: DC | PRN
  Filled 2012-04-06: qty 1.25

## 2012-04-06 MED ORDER — SODIUM CHLORIDE 0.9 % IJ SOLN
10.0000 mL | INTRAMUSCULAR | Status: DC | PRN
  Filled 2012-04-06: qty 10

## 2012-04-06 MED ORDER — FLUOROURACIL CHEMO INJECTION 2.5 GM/50ML
300.0000 mg/m2 | Freq: Once | INTRAVENOUS | Status: AC
  Administered 2012-04-06: 650 mg via INTRAVENOUS
  Filled 2012-04-06: qty 13

## 2012-04-06 MED ORDER — SODIUM CHLORIDE 0.9 % IV SOLN: Freq: Once | INTRAVENOUS | Status: DC

## 2012-04-06 MED ORDER — IRINOTECAN HCL CHEMO INJECTION 100 MG/5ML
135.0000 mg/m2 | Freq: Once | INTRAVENOUS | Status: AC
  Administered 2012-04-06: 284 mg via INTRAVENOUS
  Filled 2012-04-06: qty 14.2

## 2012-04-06 MED ORDER — ONDANSETRON 16 MG/50ML IVPB (CHCC)
16.0000 mg | Freq: Once | INTRAVENOUS | Status: AC
  Administered 2012-04-06: 16 mg via INTRAVENOUS

## 2012-04-06 MED ORDER — SODIUM CHLORIDE 0.9 % IV SOLN
1800.0000 mg/m2 | INTRAVENOUS | Status: DC
  Administered 2012-04-06: 3800 mg via INTRAVENOUS
  Filled 2012-04-06: qty 76

## 2012-04-06 MED ORDER — DEXAMETHASONE SODIUM PHOSPHATE 4 MG/ML IJ SOLN
20.0000 mg | Freq: Once | INTRAMUSCULAR | Status: AC
  Administered 2012-04-06: 20 mg via INTRAVENOUS

## 2012-04-06 MED ORDER — HEPARIN SOD (PORK) LOCK FLUSH 100 UNIT/ML IV SOLN
500.0000 [IU] | Freq: Once | INTRAVENOUS | Status: DC | PRN
  Filled 2012-04-06: qty 5

## 2012-04-06 NOTE — Progress Notes (Signed)
[facility]  Ovidio Kin, MD,  FACS [address]  [address] [place], [place]    [address] Phone:  [phone] FAX:  [phone]   Re:   Vincent [name] DOB:   1932/11/21 MRN:   [id number]  ASSESSMENT AND PLAN: 1.  Small bowel obstruction  Enterolysis - D. Khiry Pasquariello - 03/01/2012  He has done well from this surgery.  His wound looks much better.  Return to office in 2 - 3 weeks to check his wound.  2.  Rectal cancer.  Dr Gerrit Friends did primary resection and end colostomy on 02/13/2010.  He had 8/16 nodes positive.  He is followed by Dr. Elita Boone.  He has a pelvic recurrence of the rectal cancer. He is now on FOLFIRI for salvage purposes.  He sees Dr. Juliette Alcide later today.  3.  On Coumadin for A.Fib  INR - 2.13 - 03/14/2012 4. PAF (paroxysmal atrial fibrillation)   Has seen Dr. Erlene Quan  5. HTN (hypertension)  6. AAA (abdominal aortic aneurysm) without rupture  7.  Open wound  It looks much better today.  HISTORY OF PRESENT ILLNESS: Chief Complaint  Patient presents with  . Wound Check    abd   Vincent [name] is a 78 y.o. (DOB: 1932-06-03)  white male who is a patient of BOUSKA,Dimond Crotty E, MD and comes to me today for post op follow up.  He is doing fairly well. His grandson is helping pack the wound.  He is otherwise doing well and joking about "hair removal" meds.  PHYSICAL EXAM: BP 128/74  Pulse 60  Resp 20  Ht 5\' 11"  (1.803 m)  Wt 185 lb (83.915 kg)  BMI 25.80 kg/m2  General: WN older WM who is alert and generally healthy appearing.  HEENT: Normal. Pupils equal.  Abdomen: Soft. No mass. No tenderness. Left mid abdomen ostomy.       Mid line wound is open and looks much better than last time.  It does tunnel a little toward the cranial end.  DATA REVIEWED: No new data   Ovidio Kin, MD, FACS Office:  [phone]

## 2012-04-06 NOTE — Progress Notes (Signed)
Hematology and Oncology Follow Up Visit  Vincent [name] [id number] 1933-03-08 78 y.o. 04/06/2012 2:50 PM  CC: Velora Heckler, MD  Tracey Harries, M.D.  Iva Boop, MD,FACG  Billie Lade, Ph.D., M.D.    Principle Diagnosis: This is a 78 year old gentleman diagnosed with adenocarcinoma of the rectum.  He had T4a N2 disease diagnosed in 2011.  He presented obstruction of the distal rectal area. Now has local relapse.    Prior Therapy: 1. Status post rectosigmoid resection and segmental resection on February 13, 2010.  Tumor was poorly differentiated adenocarcinoma, 8 out of 16 lymph nodes involved, tumor not completely resected at that time.  2. The patient received radiation therapy adamantly concomitantly with Xeloda.  Therapy concluded in January 2012.  3. Treated with adjuvant FOLFOX for a total of 7 cycles of therapy.  The therapy concluded Sep 16, 2010.  Current therapy: FOLFIRI for salvage purposes. He is S/P first cycle on 9/25. He is here for cycle 4.   Interim History:  Vincent [name] presents today for an office followup visit. He was recently hospitalized for SBO. He is s/p laparoscopy converted to an open enterolysis of adhesions on 03/15/12. Still with an open wound that he is applying a wet to dry dressing to BID. No drainage. He is still fatigued since hospital discharge but not any worse. He has not had any abdominal pain. No nausea or vomiting. No problems with constipation.  Continues to have peripheral neuropathy that is unchanged. He denies any headaches, any visual changes, any dysphagia, odynophagia, any nausea, vomiting, diarrhea, constipation, chest pain, shortness of breath, productive cough, abdominal pain, abdominal swelling, lower extremity paresthesia, bowel or bladder incontinence, any obvious bleeding such as melena, hematochezia, or hematuria.  He denies any fevers, chills, or night sweats. Appetite is improving at this time. No new complications from chemotherapy.    Medications: I have reviewed the patient's current medications. Current outpatient prescriptions:albuterol-ipratropium (COMBIVENT) 18-103 MCG/ACT inhaler, Inhale 1 puff into the lungs every 4 (four) hours as needed. WHEEZING, Disp: , Rfl: ;  aspirin EC 81 MG tablet, Take 81 mg by mouth daily., Disp: , Rfl: ;  Fluticasone-Salmeterol (ADVAIR DISKUS) 500-50 MCG/DOSE AEPB, Inhale 1 puff into the lungs every 12 (twelve) hours., Disp: 60 each, Rfl: 3 HYDROmorphone (DILAUDID) 4 MG tablet, Take 1 tablet (4 mg total) by mouth every 6 (six) hours as needed. Pain, Disp: 30 tablet, Rfl: 0;  metFORMIN (GLUCOPHAGE) 500 MG tablet, Take 500 mg by mouth daily with breakfast. , Disp: , Rfl: ;  nitroGLYCERIN (NITROSTAT) 0.3 MG SL tablet, Place 0.3 mg under the tongue every 5 (five) minutes as needed., Disp: , Rfl:  ondansetron (ZOFRAN) 4 MG tablet, Take 1 tablet (4 mg total) by mouth every 6 (six) hours as needed for nausea., Disp: 20 tablet, Rfl: 0;  oxyCODONE (OXY IR/ROXICODONE) 5 MG immediate release tablet, Take 1 tablet (5 mg total) by mouth every 6 (six) hours as needed. Breakthrough pain, Disp: 30 tablet, Rfl: 0;  PROAIR HFA 108 (90 BASE) MCG/ACT inhaler, as needed., Disp: , Rfl:  simvastatin (ZOCOR) 40 MG tablet, Take 1 tablet (40 mg total) by mouth at bedtime., Disp: 30 tablet, Rfl: 0;  warfarin (COUMADIN) 5 MG tablet, Take 0.5 tablets (2.5 mg total) by mouth daily. Pt takes 5 mg every day except on Monday and Friday pt takes 2.5mg  ( 1/2 tab of 5 mg tablet), Disp: 30 tablet, Rfl: 0 No current facility-administered medications for this visit. Facility-Administered Medications Ordered  in Other Visits: 0.9 %  sodium chloride infusion, , Intravenous, Once, Benjiman Core, MD;  atropine injection 0.5 mg, 0.5 mg, Intravenous, Once PRN, Benjiman Core, MD;  [address] dexamethasone (DECADRON) injection 20 mg, 20 mg, Intravenous, Once, Benjiman Core, MD, 20 mg at 04/06/12 1217 fluorouracil (ADRUCIL) 3,800 mg in sodium  chloride 0.9 % 150 mL chemo infusion, 1,800 mg/m2 (Treatment Plan Actual), Intravenous, 1 day or 1 dose, Myrtis Ser, NP, 3,800 mg at 04/06/12 1423;  [COMPLETED] fluorouracil (ADRUCIL) chemo injection 650 mg, 300 mg/m2 (Treatment Plan Actual), Intravenous, Once, Myrtis Ser, NP, 650 mg at 04/06/12 1418;  heparin lock flush 100 unit/mL, 500 Units, Intracatheter, Once PRN, Benjiman Core, MD [COMPLETED] irinotecan (CAMPTOSAR) 284 mg in dextrose 5 % 500 mL chemo infusion, 135 mg/m2 (Treatment Plan Actual), Intravenous, Once, Myrtis Ser, NP, Last Rate: 343 mL/hr at 04/06/12 1240, 284 mg at 04/06/12 1240;  [COMPLETED] leucovorin 634 mg in dextrose 5 % 250 mL infusion, 300 mg/m2 (Treatment Plan Actual), Intravenous, Once, Myrtis Ser, NP, Last Rate: 188 mL/hr at 04/06/12 1240, 634 mg at 04/06/12 1240 [COMPLETED] ondansetron (ZOFRAN) IVPB 16 mg, 16 mg, Intravenous, Once, Benjiman Core, MD, 16 mg at 04/06/12 1219;  sodium chloride 0.9 % injection 10 mL, 10 mL, Intracatheter, PRN, Benjiman Core, MD  Allergies: No Known Allergies  Past Medical History, Surgical history, Social history, and Family History were reviewed and updated.  Review of Systems: Constitutional:  Negative for fever, chills, night sweats, anorexia, weight loss, pain. Cardiovascular: no chest pain or dyspnea on exertion Respiratory: no cough, shortness of breath, or wheezing Neurological: no TIA or stroke symptoms Dermatological: negative ENT: negative Skin: Negative. Gastrointestinal: no abdominal pain, change in bowel habits, or black or bloody stools Genito-Urinary: no dysuria, trouble voiding, or hematuria Hematological and Lymphatic: negative Breast: negative Musculoskeletal: negative Remaining ROS negative.  Physical Exam: Blood pressure 114/58, pulse 65, temperature 97.3 F (36.3 C), temperature source Oral, resp. rate 20, height 5\' 11"  (1.803 m), weight 184 lb 3.2 oz (83.553 kg). ECOG: 1-2 General  appearance: alert Head: Normocephalic, without obvious abnormality, atraumatic Neck: no adenopathy, no carotid bruit, no JVD, supple, symmetrical, trachea midline and thyroid not enlarged, symmetric, no tenderness/mass/nodules Lymph nodes: Cervical, supraclavicular, and axillary nodes normal. Heart:regular rate and rhythm, S1, S2 normal, no murmur, click, rub or gallop Lung:chest clear, no wheezing, rales, normal symmetric air entry Abdomen: soft, non-tender, without masses or organomegaly EXT:no erythema, induration, or nodules   Lab Results: Lab Results  Component Value Date   WBC 8.1 04/06/2012   HGB 10.4* 04/06/2012   HCT 33.1* 04/06/2012   MCV 95.9 04/06/2012   PLT 207 04/06/2012    Impression and Plan: This is a pleasant 77 year old gentleman with the following issues:   1. Advanced rectal carcinoma, presented with a T4 N2 disease.  He presented with a complete obstruction.  After segmental resection, he underwent radiation therapy with Xeloda and subsequently systemic chemotherapy.  At this time, he has  evidence of  recurrent disease with pelvic lymph nodes. He is ready to proceed with cycle 4 of chemotherapy.  2. Anemia. Likely multi-factorial. He has no active bleeding. No transfusion is indicated. He remains on ferrous sulfate. 3. Constipation: Improved at this time. 4. Protein calorie malnutrition: Appetite is improving.   5. Port-A-Cath management. This will be used for chemotherapy.   6. Atrial fibrillation. Anticoagulated with Coumadin. 7. Hypertension.  Normotensive without medication at this time. 8. DM.  On Metformin. 9. Followup: 2 weeks for cycle 5.   Gracianna Vink 12/4/20132:50 PM

## 2012-04-06 NOTE — Patient Instructions (Addendum)
[facility] Discharge Instructions for Patients Receiving Chemotherapy  Today you received the following chemotherapy agents foliri  To help prevent nausea and vomiting after your treatment, we encourage you to take your nausea medication  and take it as often as prescribed   If you develop nausea and vomiting that is not controlled by your nausea medication, call the clinic. If it is after clinic hours your family physician or the after hours number for the clinic or go to the Emergency Department.   BELOW ARE SYMPTOMS THAT SHOULD BE REPORTED IMMEDIATELY:  *FEVER GREATER THAN 100.5 F  *CHILLS WITH OR WITHOUT FEVER  NAUSEA AND VOMITING THAT IS NOT CONTROLLED WITH YOUR NAUSEA MEDICATION  *UNUSUAL SHORTNESS OF BREATH  *UNUSUAL BRUISING OR BLEEDING  TENDERNESS IN MOUTH AND THROAT WITH OR WITHOUT PRESENCE OF ULCERS  *URINARY PROBLEMS  *BOWEL PROBLEMS  UNUSUAL RASH Items with * indicate a potential emergency and should be followed up as soon as possible.  One of the nurses will contact you 24 hours after your treatment. Please let the nurse know about any problems that you may have experienced. Feel free to call the clinic you have any questions or concerns. The clinic phone number is [phone].   I have been informed and understand all the instructions given to me. I know to contact the clinic, my physician, or go to the Emergency Department if any problems should occur. I do not have any questions at this time, but understand that I may call the clinic during office hours   should I have any questions or need assistance in obtaining follow up care.    __________________________________________  _____________  __________ Signature of Patient or Authorized Representative            Date                   Time    __________________________________________ Nurse's Signature

## 2012-04-07 ENCOUNTER — Encounter (INDEPENDENT_AMBULATORY_CARE_PROVIDER_SITE_OTHER): Payer: Medicare Other | Admitting: Surgery

## 2012-04-07 ENCOUNTER — Telehealth: Payer: Self-pay | Admitting: Oncology

## 2012-04-07 NOTE — Telephone Encounter (Signed)
Per FS he can see pt 12/18 @ 10:15 am. S/w pt re appt for 12/18 and pt will get new schedule when he comes tomorrow.

## 2012-04-08 ENCOUNTER — Ambulatory Visit (HOSPITAL_BASED_OUTPATIENT_CLINIC_OR_DEPARTMENT_OTHER): Payer: Medicare Other

## 2012-04-08 VITALS — BP 137/62 | HR 99 | Temp 97.5°F

## 2012-04-08 DIAGNOSIS — C772 Secondary and unspecified malignant neoplasm of intra-abdominal lymph nodes: Secondary | ICD-10-CM

## 2012-04-08 DIAGNOSIS — C20 Malignant neoplasm of rectum: Secondary | ICD-10-CM

## 2012-04-08 MED ORDER — SODIUM CHLORIDE 0.9 % IJ SOLN
10.0000 mL | INTRAMUSCULAR | Status: DC | PRN
  Administered 2012-04-08: 10 mL
  Filled 2012-04-08: qty 10

## 2012-04-08 MED ORDER — HEPARIN SOD (PORK) LOCK FLUSH 100 UNIT/ML IV SOLN
500.0000 [IU] | Freq: Once | INTRAVENOUS | Status: AC | PRN
  Administered 2012-04-08: 500 [IU]
  Filled 2012-04-08: qty 5

## 2012-04-08 NOTE — Patient Instructions (Signed)
Call MD for problems 

## 2012-04-20 ENCOUNTER — Telehealth: Payer: Self-pay | Admitting: Oncology

## 2012-04-20 ENCOUNTER — Ambulatory Visit (HOSPITAL_BASED_OUTPATIENT_CLINIC_OR_DEPARTMENT_OTHER): Payer: Medicare Other

## 2012-04-20 ENCOUNTER — Other Ambulatory Visit (HOSPITAL_BASED_OUTPATIENT_CLINIC_OR_DEPARTMENT_OTHER): Payer: Medicare Other | Admitting: Lab

## 2012-04-20 ENCOUNTER — Telehealth: Payer: Self-pay | Admitting: *Deleted

## 2012-04-20 ENCOUNTER — Ambulatory Visit (HOSPITAL_BASED_OUTPATIENT_CLINIC_OR_DEPARTMENT_OTHER): Payer: Medicare Other | Admitting: Oncology

## 2012-04-20 VITALS — BP 115/68 | HR 57 | Temp 97.9°F | Resp 20 | Ht 71.0 in | Wt 186.9 lb

## 2012-04-20 DIAGNOSIS — C20 Malignant neoplasm of rectum: Secondary | ICD-10-CM

## 2012-04-20 DIAGNOSIS — Z5111 Encounter for antineoplastic chemotherapy: Secondary | ICD-10-CM

## 2012-04-20 DIAGNOSIS — D649 Anemia, unspecified: Secondary | ICD-10-CM

## 2012-04-20 LAB — CBC WITH DIFFERENTIAL/PLATELET
EOS%: 2.2 % (ref 0.0–7.0)
MCH: 30.2 pg (ref 27.2–33.4)
MCHC: 31.5 g/dL — ABNORMAL LOW (ref 32.0–36.0)
MCV: 95.9 fL (ref 79.3–98.0)
MONO%: 6.9 % (ref 0.0–14.0)
RBC: 3.44 10*6/uL — ABNORMAL LOW (ref 4.20–5.82)
RDW: 17.9 % — ABNORMAL HIGH (ref 11.0–14.6)
nRBC: 0 % (ref 0–0)

## 2012-04-20 LAB — COMPREHENSIVE METABOLIC PANEL (CC13)
ALT: 10 U/L (ref 0–55)
Alkaline Phosphatase: 79 U/L (ref 40–150)
Sodium: 146 mEq/L — ABNORMAL HIGH (ref 136–145)
Total Bilirubin: 0.41 mg/dL (ref 0.20–1.20)
Total Protein: 6.3 g/dL — ABNORMAL LOW (ref 6.4–8.3)

## 2012-04-20 MED ORDER — FLUOROURACIL CHEMO INJECTION 2.5 GM/50ML
300.0000 mg/m2 | Freq: Once | INTRAVENOUS | Status: AC
  Administered 2012-04-20: 650 mg via INTRAVENOUS
  Filled 2012-04-20: qty 13

## 2012-04-20 MED ORDER — IRINOTECAN HCL CHEMO INJECTION 100 MG/5ML
135.0000 mg/m2 | Freq: Once | INTRAVENOUS | Status: AC
  Administered 2012-04-20: 284 mg via INTRAVENOUS
  Filled 2012-04-20: qty 14.2

## 2012-04-20 MED ORDER — ONDANSETRON 16 MG/50ML IVPB (CHCC)
16.0000 mg | Freq: Once | INTRAVENOUS | Status: AC
  Administered 2012-04-20: 16 mg via INTRAVENOUS

## 2012-04-20 MED ORDER — DEXAMETHASONE SODIUM PHOSPHATE 4 MG/ML IJ SOLN
20.0000 mg | Freq: Once | INTRAMUSCULAR | Status: AC
  Administered 2012-04-20: 20 mg via INTRAVENOUS

## 2012-04-20 MED ORDER — SODIUM CHLORIDE 0.9 % IV SOLN
1800.0000 mg/m2 | INTRAVENOUS | Status: DC
  Administered 2012-04-20: 3800 mg via INTRAVENOUS
  Filled 2012-04-20: qty 76

## 2012-04-20 MED ORDER — LEUCOVORIN CALCIUM INJECTION 350 MG
300.0000 mg/m2 | Freq: Once | INTRAVENOUS | Status: AC
  Administered 2012-04-20: 634 mg via INTRAVENOUS
  Filled 2012-04-20: qty 31.7

## 2012-04-20 MED ORDER — SODIUM CHLORIDE 0.9 % IV SOLN
Freq: Once | INTRAVENOUS | Status: AC
  Administered 2012-04-20: 12:00:00 via INTRAVENOUS

## 2012-04-20 MED ORDER — ATROPINE SULFATE 0.4 MG/ML IJ SOLN
0.5000 mg | Freq: Once | INTRAMUSCULAR | Status: DC | PRN
  Filled 2012-04-20: qty 1.25

## 2012-04-20 NOTE — Telephone Encounter (Signed)
Per staff mesage and POF I have scheduled appts. JMW

## 2012-04-20 NOTE — Patient Instructions (Signed)
[facility] Discharge Instructions for Patients Receiving Chemotherapy  Today you received the following chemotherapy agents : 5FU, Leucovorin, Irinotecan  To help prevent nausea and vomiting after your treatment, we encourage you to take your nausea medication as directed by your MD. If you develop nausea and vomiting that is not controlled by your nausea medication, call the clinic. If it is after clinic hours your family physician or the after hours number for the clinic or go to the Emergency Department.   BELOW ARE SYMPTOMS THAT SHOULD BE REPORTED IMMEDIATELY:  *FEVER GREATER THAN 100.5 F  *CHILLS WITH OR WITHOUT FEVER  NAUSEA AND VOMITING THAT IS NOT CONTROLLED WITH YOUR NAUSEA MEDICATION  *UNUSUAL SHORTNESS OF BREATH  *UNUSUAL BRUISING OR BLEEDING  TENDERNESS IN MOUTH AND THROAT WITH OR WITHOUT PRESENCE OF ULCERS  *URINARY PROBLEMS  *BOWEL PROBLEMS  UNUSUAL RASH Items with * indicate a potential emergency and should be followed up as soon as possible.  Feel free to call the clinic you have any questions or concerns. The clinic phone number is [phone].    

## 2012-04-20 NOTE — Progress Notes (Signed)
Hematology and Oncology Follow Up Visit  Vincent [name] [id number] 17-Jun-1932 78 y.o. 04/20/2012 10:23 AM  CC: Vincent Heckler, MD  Vincent [name], M.D.  Vincent Boop, MD,FACG  Vincent [name], Ph.D., M.D.    Principle Diagnosis: This is a 78 year old gentleman diagnosed with adenocarcinoma of the rectum.  He had T4a N2 disease diagnosed in 2011.  He presented obstruction of the distal rectal area. Now has local relapse.    Prior Therapy: 1. Status post rectosigmoid resection and segmental resection on February 13, 2010.  Tumor was poorly differentiated adenocarcinoma, 8 out of 16 lymph nodes involved, tumor not completely resected at that time.  2. The patient received radiation therapy adamantly concomitantly with Xeloda.  Therapy concluded in January 2012.  3. Treated with adjuvant FOLFOX for a total of 7 cycles of therapy.  The therapy concluded Sep 16, 2010.  Current therapy: FOLFIRI for salvage purposes. He is S/P first cycle on 9/25. He is here for cycle 5.   Interim History:  Vincent [name] presents today for an office followup visit. He was recently hospitalized for SBO. He is s/p laparoscopy converted to an open enterolysis of adhesions on 03/15/12. Still with an open wound that is improving at this time. No drainage noted. He has not had any abdominal pain. No nausea or vomiting. No problems with constipation.  Continues to have peripheral neuropathy that is unchanged. He denies any headaches, any visual changes, any dysphagia, odynophagia, any nausea, vomiting, diarrhea, constipation, chest pain, shortness of breath, productive cough, abdominal pain, abdominal swelling, lower extremity paresthesia, bowel or bladder incontinence, any obvious bleeding such as melena, hematochezia, or hematuria.  He denies any fevers, chills, or night sweats. Appetite is improving at this time. No new complications from chemotherapy.   Medications: I have reviewed the patient's current medications. Current  outpatient prescriptions:albuterol-ipratropium (COMBIVENT) 18-103 MCG/ACT inhaler, Inhale 1 puff into the lungs every 4 (four) hours as needed. WHEEZING, Disp: , Rfl: ;  aspirin EC 81 MG tablet, Take 81 mg by mouth daily., Disp: , Rfl: ;  Fluticasone-Salmeterol (ADVAIR DISKUS) 500-50 MCG/DOSE AEPB, Inhale 1 puff into the lungs every 12 (twelve) hours., Disp: 60 each, Rfl: 3 HYDROmorphone (DILAUDID) 4 MG tablet, Take 1 tablet (4 mg total) by mouth every 6 (six) hours as needed. Pain, Disp: 30 tablet, Rfl: 0;  metFORMIN (GLUCOPHAGE) 500 MG tablet, Take 500 mg by mouth daily with breakfast. , Disp: , Rfl: ;  nitroGLYCERIN (NITROSTAT) 0.3 MG SL tablet, Place 0.3 mg under the tongue every 5 (five) minutes as needed., Disp: , Rfl:  ondansetron (ZOFRAN) 4 MG tablet, Take 1 tablet (4 mg total) by mouth every 6 (six) hours as needed for nausea., Disp: 20 tablet, Rfl: 0;  oxyCODONE (OXY IR/ROXICODONE) 5 MG immediate release tablet, Take 1 tablet (5 mg total) by mouth every 6 (six) hours as needed. Breakthrough pain, Disp: 30 tablet, Rfl: 0;  PROAIR HFA 108 (90 BASE) MCG/ACT inhaler, as needed., Disp: , Rfl:  simvastatin (ZOCOR) 40 MG tablet, Take 1 tablet (40 mg total) by mouth at bedtime., Disp: 30 tablet, Rfl: 0;  warfarin (COUMADIN) 5 MG tablet, Take 0.5 tablets (2.5 mg total) by mouth daily. Pt takes 5 mg every day except on Monday and Friday pt takes 2.5mg  ( 1/2 tab of 5 mg tablet), Disp: 30 tablet, Rfl: 0  Allergies: No Known Allergies  Past Medical History, Surgical history, Social history, and Family History were reviewed and updated.  Review of Systems: Constitutional:  Negative for fever, chills, night sweats, anorexia, weight loss, pain. Cardiovascular: no chest pain or dyspnea on exertion Respiratory: no cough, shortness of breath, or wheezing Neurological: no TIA or stroke symptoms Dermatological: negative ENT: negative Skin: Negative. Gastrointestinal: no abdominal pain, change in bowel habits,  or black or bloody stools Genito-Urinary: no dysuria, trouble voiding, or hematuria Hematological and Lymphatic: negative Breast: negative Musculoskeletal: negative Remaining ROS negative.  Physical Exam: Blood pressure 115/68, pulse 57, temperature 97.9 F (36.6 C), temperature source Oral, resp. rate 20, height 5\' 11"  (1.803 m), weight 186 lb 14.4 oz (84.777 kg). ECOG: 1-2 General appearance: alert Head: Normocephalic, without obvious abnormality, atraumatic Neck: no adenopathy, no carotid bruit, no JVD, supple, symmetrical, trachea midline and thyroid not enlarged, symmetric, no tenderness/mass/nodules Lymph nodes: Cervical, supraclavicular, and axillary nodes normal. Heart:regular rate and rhythm, S1, S2 normal, no murmur, click, rub or gallop Lung:chest clear, no wheezing, rales, normal symmetric air entry Abdomen: soft, non-tender, without masses or organomegaly EXT:no erythema, induration, or nodules   Lab Results: Lab Results  Component Value Date   WBC 8.1 04/06/2012   HGB 10.4* 04/06/2012   HCT 33.1* 04/06/2012   MCV 95.9 04/06/2012   PLT 207 04/06/2012    Impression and Plan: This is a pleasant 78 year old gentleman with the following issues:   1. Advanced rectal carcinoma, presented with a T4 N2 disease.  He presented with a complete obstruction.  After segmental resection, he underwent radiation therapy with Xeloda and subsequently systemic chemotherapy.  At this time, he has  evidence of  recurrent disease with pelvic lymph nodes. He is ready to proceed with cycle 5 of chemotherapy. I will obtain a CT scan before the next visit.  2. Anemia. Likely multi-factorial. He has no active bleeding. No transfusion is indicated. He remains on ferrous sulfate. 3. Constipation: Improved at this time. 4. Protein calorie malnutrition: Appetite is improving.   5. Port-A-Cath management. This will be used for chemotherapy.   6. Atrial fibrillation. Anticoagulated with  Coumadin. 7. Hypertension.  Normotensive without medication at this time. 8. DM. On Metformin. 9. Followup: 2-3 weeks for cycle 6.   Vincent [name] 12/18/201310:23 AM

## 2012-04-20 NOTE — Telephone Encounter (Signed)
gv and printed appt schedule for pt for Jan...gv pt Barium and pt aware central scheduling will contact with d/t of ct.

## 2012-04-22 ENCOUNTER — Ambulatory Visit (INDEPENDENT_AMBULATORY_CARE_PROVIDER_SITE_OTHER): Payer: Medicare Other | Admitting: Surgery

## 2012-04-22 ENCOUNTER — Encounter (INDEPENDENT_AMBULATORY_CARE_PROVIDER_SITE_OTHER): Payer: Self-pay | Admitting: Surgery

## 2012-04-22 ENCOUNTER — Ambulatory Visit (HOSPITAL_BASED_OUTPATIENT_CLINIC_OR_DEPARTMENT_OTHER): Payer: Medicare Other

## 2012-04-22 ENCOUNTER — Telehealth (INDEPENDENT_AMBULATORY_CARE_PROVIDER_SITE_OTHER): Payer: Self-pay

## 2012-04-22 VITALS — BP 126/82 | HR 65 | Temp 98.0°F | Resp 18 | Ht 71.0 in | Wt 197.0 lb

## 2012-04-22 VITALS — BP 145/77 | HR 73 | Temp 97.8°F | Resp 20

## 2012-04-22 DIAGNOSIS — C20 Malignant neoplasm of rectum: Secondary | ICD-10-CM

## 2012-04-22 DIAGNOSIS — K56609 Unspecified intestinal obstruction, unspecified as to partial versus complete obstruction: Secondary | ICD-10-CM

## 2012-04-22 MED ORDER — SODIUM CHLORIDE 0.9 % IJ SOLN
10.0000 mL | INTRAMUSCULAR | Status: DC | PRN
  Administered 2012-04-22: 10 mL
  Filled 2012-04-22: qty 10

## 2012-04-22 MED ORDER — HEPARIN SOD (PORK) LOCK FLUSH 100 UNIT/ML IV SOLN
500.0000 [IU] | Freq: Once | INTRAVENOUS | Status: AC | PRN
  Administered 2012-04-22: 500 [IU]
  Filled 2012-04-22: qty 5

## 2012-04-22 NOTE — Telephone Encounter (Signed)
Appt for wound check 05/26/12 @ 5p

## 2012-04-22 NOTE — Progress Notes (Signed)
[facility]  Ovidio Kin, MD,  FACS [address] [place]  [address] [place], [place]    [address] Phone:  [phone] FAX:  [phone]   Re:   Vincent [name] DOB:   [birth year] MRN:   [id number]  ASSESSMENT AND PLAN: 1.  Small bowel obstruction  Enterolysis - D. Westen Dinino - 03/01/2012  Open wound is improving.  Return to office in 4 weeks to check his wound.  2.  Rectal cancer.  Dr Gerrit Friends did primary resection and end colostomy on 02/13/2010.  He had 8/16 nodes positive.  He is followed by Dr. Elita Boone.  He has a pelvic recurrence of the rectal cancer. He is now on FOLFIRI for salvage purposes.  He sees Dr. Juliette Alcide.  He is getting chemotx - FOLFIRI for salvage purposes.  3.  On Coumadin for A.Fib  INR - 2.13 - 03/14/2012 4. PAF (paroxysmal atrial fibrillation)   Has seen Dr. Erlene Quan  5. HTN (hypertension)  6. AAA (abdominal aortic aneurysm) without rupture  7.  Open wound  It looks much better today.  HISTORY OF PRESENT ILLNESS: Chief Complaint  Patient presents with  . Wound Check   Vincent [name] is a 78 y.o. (DOB: 1933-02-03)  white male who is a patient of BOUSKA,Jamin Humphries E, MD and comes to me today for post op follow up.  He is in good spirits and doing well.  He feels good.  He jokes about his thinned hair.  He came by himself.  PHYSICAL EXAM: BP 126/82  Pulse 65  Temp 98 F (36.7 C) (Oral)  Resp 18  Ht 5\' 11"  (1.803 m)  Wt 197 lb (89.359 kg)  BMI 27.48 kg/m2  General: WN older WM who is alert and generally healthy appearing.  HEENT: Normal. Pupils equal.  Abdomen: Soft. No mass. No tenderness. Left mid abdomen ostomy.       Mid line wound - 3.5 x 1.5 cm, clean.  DATA REVIEWED: Reviewed Dr. Wynonia Musty notes.  Ovidio Kin, MD, FACS Office:  [phone]

## 2012-05-10 ENCOUNTER — Ambulatory Visit (HOSPITAL_COMMUNITY)
Admission: RE | Admit: 2012-05-10 | Discharge: 2012-05-10 | Disposition: A | Discharge status: Home / Self Care | Payer: Medicare Other | Source: Ambulatory Visit | Attending: Oncology | Admitting: Oncology

## 2012-05-10 DIAGNOSIS — R0602 Shortness of breath: Secondary | ICD-10-CM | POA: Insufficient documentation

## 2012-05-10 DIAGNOSIS — N281 Cyst of kidney, acquired: Secondary | ICD-10-CM | POA: Insufficient documentation

## 2012-05-10 DIAGNOSIS — I700 Atherosclerosis of aorta: Secondary | ICD-10-CM | POA: Insufficient documentation

## 2012-05-10 DIAGNOSIS — Z9049 Acquired absence of other specified parts of digestive tract: Secondary | ICD-10-CM | POA: Insufficient documentation

## 2012-05-10 DIAGNOSIS — C20 Malignant neoplasm of rectum: Secondary | ICD-10-CM

## 2012-05-10 DIAGNOSIS — Z933 Colostomy status: Secondary | ICD-10-CM | POA: Insufficient documentation

## 2012-05-10 DIAGNOSIS — R1909 Other intra-abdominal and pelvic swelling, mass and lump: Secondary | ICD-10-CM | POA: Insufficient documentation

## 2012-05-10 DIAGNOSIS — I2510 Atherosclerotic heart disease of native coronary artery without angina pectoris: Secondary | ICD-10-CM | POA: Insufficient documentation

## 2012-05-10 DIAGNOSIS — C189 Malignant neoplasm of colon, unspecified: Secondary | ICD-10-CM | POA: Insufficient documentation

## 2012-05-10 DIAGNOSIS — K7689 Other specified diseases of liver: Secondary | ICD-10-CM | POA: Insufficient documentation

## 2012-05-10 DIAGNOSIS — Z9221 Personal history of antineoplastic chemotherapy: Secondary | ICD-10-CM | POA: Insufficient documentation

## 2012-05-10 MED ORDER — IOHEXOL 300 MG/ML  SOLN
100.0000 mL | Freq: Once | INTRAMUSCULAR | Status: AC | PRN
  Administered 2012-05-10: 100 mL via INTRAVENOUS

## 2012-05-11 ENCOUNTER — Ambulatory Visit (HOSPITAL_BASED_OUTPATIENT_CLINIC_OR_DEPARTMENT_OTHER): Payer: Medicare Other | Admitting: Oncology

## 2012-05-11 ENCOUNTER — Telehealth: Payer: Self-pay | Admitting: *Deleted

## 2012-05-11 ENCOUNTER — Other Ambulatory Visit (HOSPITAL_BASED_OUTPATIENT_CLINIC_OR_DEPARTMENT_OTHER): Payer: Medicare Other | Admitting: Lab

## 2012-05-11 ENCOUNTER — Ambulatory Visit: Payer: Medicare Other | Admitting: Nutrition

## 2012-05-11 ENCOUNTER — Telehealth: Payer: Self-pay | Admitting: Oncology

## 2012-05-11 ENCOUNTER — Ambulatory Visit (HOSPITAL_BASED_OUTPATIENT_CLINIC_OR_DEPARTMENT_OTHER): Payer: Medicare Other

## 2012-05-11 VITALS — BP 144/75 | HR 55 | Temp 97.0°F | Resp 20 | Ht 71.0 in | Wt 198.6 lb

## 2012-05-11 DIAGNOSIS — D649 Anemia, unspecified: Secondary | ICD-10-CM

## 2012-05-11 DIAGNOSIS — C20 Malignant neoplasm of rectum: Secondary | ICD-10-CM

## 2012-05-11 DIAGNOSIS — I4891 Unspecified atrial fibrillation: Secondary | ICD-10-CM

## 2012-05-11 DIAGNOSIS — I10 Essential (primary) hypertension: Secondary | ICD-10-CM

## 2012-05-11 DIAGNOSIS — Z5111 Encounter for antineoplastic chemotherapy: Secondary | ICD-10-CM

## 2012-05-11 LAB — COMPREHENSIVE METABOLIC PANEL (CC13)
ALT: 7 U/L (ref 0–55)
BUN: 12 mg/dL (ref 7.0–26.0)
CO2: 26 mEq/L (ref 22–29)
Calcium: 8.8 mg/dL (ref 8.4–10.4)
Chloride: 102 mEq/L (ref 98–107)
Creatinine: 1.1 mg/dL (ref 0.7–1.3)
Glucose: 193 mg/dl — ABNORMAL HIGH (ref 70–99)

## 2012-05-11 LAB — CBC WITH DIFFERENTIAL/PLATELET
Basophils Absolute: 0 10*3/uL (ref 0.0–0.1)
Eosinophils Absolute: 0.1 10*3/uL (ref 0.0–0.5)
HCT: 30.5 % — ABNORMAL LOW (ref 38.4–49.9)
HGB: 10.1 g/dL — ABNORMAL LOW (ref 13.0–17.1)
LYMPH%: 13.4 % — ABNORMAL LOW (ref 14.0–49.0)
MONO#: 0.5 10*3/uL (ref 0.1–0.9)
NEUT#: 2.8 10*3/uL (ref 1.5–6.5)
NEUT%: 71.2 % (ref 39.0–75.0)
Platelets: 157 10*3/uL (ref 140–400)
WBC: 3.9 10*3/uL — ABNORMAL LOW (ref 4.0–10.3)
nRBC: 2 % — ABNORMAL HIGH (ref 0–0)

## 2012-05-11 LAB — CEA: CEA: 6.8 ng/mL — ABNORMAL HIGH (ref 0.0–5.0)

## 2012-05-11 MED ORDER — LEUCOVORIN CALCIUM INJECTION 350 MG
300.0000 mg/m2 | Freq: Once | INTRAVENOUS | Status: AC
  Administered 2012-05-11: 634 mg via INTRAVENOUS
  Filled 2012-05-11: qty 31.7

## 2012-05-11 MED ORDER — DEXAMETHASONE SODIUM PHOSPHATE 10 MG/ML IJ SOLN
20.0000 mg | Freq: Once | INTRAMUSCULAR | Status: AC
  Administered 2012-05-11: 20 mg via INTRAVENOUS

## 2012-05-11 MED ORDER — FLUOROURACIL CHEMO INJECTION 2.5 GM/50ML
300.0000 mg/m2 | Freq: Once | INTRAVENOUS | Status: AC
  Administered 2012-05-11: 650 mg via INTRAVENOUS
  Filled 2012-05-11: qty 13

## 2012-05-11 MED ORDER — SODIUM CHLORIDE 0.9 % IV SOLN
1800.0000 mg/m2 | INTRAVENOUS | Status: DC
  Administered 2012-05-11: 3800 mg via INTRAVENOUS
  Filled 2012-05-11: qty 76

## 2012-05-11 MED ORDER — ONDANSETRON 16 MG/50ML IVPB (CHCC)
16.0000 mg | Freq: Once | INTRAVENOUS | Status: AC
  Administered 2012-05-11: 16 mg via INTRAVENOUS

## 2012-05-11 MED ORDER — IRINOTECAN HCL CHEMO INJECTION 100 MG/5ML
135.0000 mg/m2 | Freq: Once | INTRAVENOUS | Status: AC
  Administered 2012-05-11: 284 mg via INTRAVENOUS
  Filled 2012-05-11: qty 14.2

## 2012-05-11 MED ORDER — SODIUM CHLORIDE 0.9 % IV SOLN
Freq: Once | INTRAVENOUS | Status: AC
  Administered 2012-05-11: 09:00:00 via INTRAVENOUS

## 2012-05-11 NOTE — Progress Notes (Signed)
I spoke with patient briefly.  He reports that he is eating much better. He is beginning to gain some weight back. He reports weight documented as 198.6 pounds January 8. He reports a usual body weight of 288 pounds prior to diagnosis. Patient has no nutrition related questions or concerns at this time. I have provided my contact information for patient to call me if he develops any nutrition related side effects.  Patient is agreeable and appreciative of contact.

## 2012-05-11 NOTE — Telephone Encounter (Signed)
Per staff message and POF I have scheduled appts.  JMW  

## 2012-05-11 NOTE — Patient Instructions (Addendum)
[facility] Discharge Instructions for Patients Receiving Chemotherapy  Today you received the following chemotherapy agents Leucovorin, Camptosar and 5FU.  To help prevent nausea and vomiting after your treatment, we encourage you to take your nausea medication.   If you develop nausea and vomiting that is not controlled by your nausea medication, call the clinic. If it is after clinic hours your family physician or the after hours number for the clinic or go to the Emergency Department.   BELOW ARE SYMPTOMS THAT SHOULD BE REPORTED IMMEDIATELY:  *FEVER GREATER THAN 100.5 F  *CHILLS WITH OR WITHOUT FEVER  NAUSEA AND VOMITING THAT IS NOT CONTROLLED WITH YOUR NAUSEA MEDICATION  *UNUSUAL SHORTNESS OF BREATH  *UNUSUAL BRUISING OR BLEEDING  TENDERNESS IN MOUTH AND THROAT WITH OR WITHOUT PRESENCE OF ULCERS  *URINARY PROBLEMS  *BOWEL PROBLEMS  UNUSUAL RASH Items with * indicate a potential emergency and should be followed up as soon as possible.  One of the nurses will contact you 24 hours after your treatment. Please let the nurse know about any problems that you may have experienced. Feel free to call the clinic you have any questions or concerns. The clinic phone number is [phone].   I have been informed and understand all the instructions given to me. I know to contact the clinic, my physician, or go to the Emergency Department if any problems should occur. I do not have any questions at this time, but understand that I may call the clinic during office hours   should I have any questions or need assistance in obtaining follow up care.    __________________________________________  _____________  __________ Signature of Patient or Authorized Representative            Date                   Time    __________________________________________ Nurse's Signature

## 2012-05-11 NOTE — Progress Notes (Signed)
Hematology and Oncology Follow Up Visit  Vincent [name] [id number] 1932-08-27 79 y.o. 05/11/2012 8:41 AM  CC: Velora Heckler, MD  Tracey Harries, M.D.  Iva Boop, MD,FACG  Billie Lade, Ph.D., M.D.    Principle Diagnosis: This is a 79 year old gentleman diagnosed with adenocarcinoma of the rectum.  He had T4a N2 disease diagnosed in 2011.  He presented obstruction of the distal rectal area. Now has local relapse.    Prior Therapy: 1. Status post rectosigmoid resection and segmental resection on February 13, 2010.  Tumor was poorly differentiated adenocarcinoma, 8 out of 16 lymph nodes involved, tumor not completely resected at that time.  2. The patient received radiation therapy adamantly concomitantly with Xeloda.  Therapy concluded in January 2012.  3. Treated with adjuvant FOLFOX for a total of 7 cycles of therapy.  The therapy concluded Sep 16, 2010.  Current therapy: FOLFIRI for salvage purposes. He is S/P first cycle on 9/25. He is here for cycle 6.   Interim History:  [name] presents today for an office followup visit. He has been doing well on chemotherapy. He is s/p laparoscopy converted to an open enterolysis of adhesions on 03/15/12. No drainage noted. He has not had any abdominal pain. No nausea or vomiting. No problems with constipation.  Continues to have peripheral neuropathy that is unchanged. He denies any headaches, any visual changes, any dysphagia, odynophagia, any nausea, vomiting, diarrhea, constipation, chest pain, shortness of breath, productive cough, abdominal pain, abdominal swelling, lower extremity paresthesia, bowel or bladder incontinence, any obvious bleeding such as melena, hematochezia, or hematuria.  He denies any fevers, chills, or night sweats. Appetite is improving at this time. No new complications from chemotherapy.   Medications: I have reviewed the patient's current medications. Current outpatient prescriptions:albuterol-ipratropium (COMBIVENT)  18-103 MCG/ACT inhaler, Inhale 1 puff into the lungs every 4 (four) hours as needed. WHEEZING, Disp: , Rfl: ;  aspirin EC 81 MG tablet, Take 81 mg by mouth daily., Disp: , Rfl: ;  Fluticasone-Salmeterol (ADVAIR DISKUS) 500-50 MCG/DOSE AEPB, Inhale 1 puff into the lungs every 12 (twelve) hours., Disp: 60 each, Rfl: 3 HYDROmorphone (DILAUDID) 4 MG tablet, Take 1 tablet (4 mg total) by mouth every 6 (six) hours as needed. Pain, Disp: 30 tablet, Rfl: 0;  metFORMIN (GLUCOPHAGE) 500 MG tablet, Take 500 mg by mouth daily with breakfast. , Disp: , Rfl: ;  nitroGLYCERIN (NITROSTAT) 0.3 MG SL tablet, Place 0.3 mg under the tongue every 5 (five) minutes as needed., Disp: , Rfl:  ondansetron (ZOFRAN) 4 MG tablet, Take 1 tablet (4 mg total) by mouth every 6 (six) hours as needed for nausea., Disp: 20 tablet, Rfl: 0;  oxyCODONE (OXY IR/ROXICODONE) 5 MG immediate release tablet, Take 1 tablet (5 mg total) by mouth every 6 (six) hours as needed. Breakthrough pain, Disp: 30 tablet, Rfl: 0;  PROAIR HFA 108 (90 BASE) MCG/ACT inhaler, as needed., Disp: , Rfl:  simvastatin (ZOCOR) 40 MG tablet, Take 1 tablet (40 mg total) by mouth at bedtime., Disp: 30 tablet, Rfl: 0;  warfarin (COUMADIN) 5 MG tablet, Take 0.5 tablets (2.5 mg total) by mouth daily. Pt takes 5 mg every day except on Monday and Friday pt takes 2.5mg  ( 1/2 tab of 5 mg tablet), Disp: 30 tablet, Rfl: 0  Allergies: No Known Allergies  Past Medical History, Surgical history, Social history, and Family History were reviewed and updated.  Review of Systems: Constitutional:  Negative for fever, chills, night sweats, anorexia, weight loss,  pain. Cardiovascular: no chest pain or dyspnea on exertion Respiratory: no cough, shortness of breath, or wheezing Neurological: no TIA or stroke symptoms Dermatological: negative ENT: negative Skin: Negative. Gastrointestinal: no abdominal pain, change in bowel habits, or black or bloody stools Genito-Urinary: no dysuria,  trouble voiding, or hematuria Hematological and Lymphatic: negative Breast: negative Musculoskeletal: negative Remaining ROS negative.  Physical Exam: Blood pressure 144/75, pulse 55, temperature 97 F (36.1 C), temperature source Oral, resp. rate 20, height 5\' 11"  (1.803 m), weight 198 lb 9.6 oz (90.084 kg). ECOG: 1-2 General appearance: alert Head: Normocephalic, without obvious abnormality, atraumatic Neck: no adenopathy, no carotid bruit, no JVD, supple, symmetrical, trachea midline and thyroid not enlarged, symmetric, no tenderness/mass/nodules Lymph nodes: Cervical, supraclavicular, and axillary nodes normal. Heart:regular rate and rhythm, S1, S2 normal, no murmur, click, rub or gallop Lung:chest clear, no wheezing, rales, normal symmetric air entry Abdomen: soft, non-tender, without masses or organomegaly EXT:no erythema, induration, or nodules   Lab Results: Lab Results  Component Value Date   WBC 3.9* 05/11/2012   HGB 10.1* 05/11/2012   HCT 30.5* 05/11/2012   MCV 98.3* 05/11/2012   PLT 157 05/11/2012   CT CHEST, ABDOMEN AND PELVIS WITH CONTRAST  Technique: Multidetector CT imaging of the chest, abdomen and  pelvis was performed following the standard protocol during bolus  administration of intravenous contrast.  Contrast: OMNIPAQUE IOHEXOL 300 MG/ML SOLN  Comparison: Chest CT 11/13. Abdominal pelvic CT 03/01/2012.  CT CHEST  Findings: Left subclavian Port-A-Cath is unchanged within the  lower SVC. There are no enlarged mediastinal, hilar or axillary  lymph nodes. Mild aortic and coronary artery atherosclerosis is  present.  There is mild pericardial thickening versus focal pericardial fluid  anteriorly which appears unchanged. There is no pleural effusion.  There are stable mild emphysematous changes throughout the lungs.  No pulmonary nodules are identified. The basilar aeration has  improved.  IMPRESSION:  1. Interval resolution of pleural effusions and bibasilar   atelectasis.  2. No evidence of metastatic disease.  CT ABDOMEN AND PELVIS  Findings: Multiple low density hepatic lesions are unchanged and  likely cysts based on stability. No new or enlarging liver lesions  are identified. The gallbladder, biliary system, pancreas, spleen  and adrenal glands appear unremarkable. There is a stable 1.3 cm  left renal cyst. There is a stable 9 mm exophytic indeterminate  lesion in the lower pole of the right kidney (image 86).  The previously demonstrated small bowel obstruction has resolved.  Descending colostomy appears unchanged. There is a persistent  irregular mass involving the rectal stump, appearing to extend  anteriorly into the prostate gland and seminal vesicles. Its  irregular shape makes comparison limited, although slight  progression is suggested (measuring approximately 7.0 x 5.1 cm on  image 112). Mild bladder wall thickening and perirectal soft  tissue stranding are stable. There are no discretely enlarged  abdominal pelvic lymph nodes.  Bilobed abdominal aortic aneurysm is again noted, not significantly  changed. This measures up to 4.2 cm anterior-posterior. There is  stable lumbar spondylosis. No worrisome osseous lesions are  identified.  IMPRESSION:  1. Suspected continued slow growth of complex pelvic mass  involving the rectum and prostate gland most consistent with  recurrent tumor. Correlate clinically.  2. No distant metastases identified.  3. Resolved small bowel obstruction.   Impression and Plan: This is a pleasant 79 year old gentleman with the following issues:   1. Advanced rectal carcinoma, presented with a T4 N2 disease.  He presented with a complete obstruction.  After segmental resection, he underwent radiation therapy with Xeloda and subsequently systemic chemotherapy.  At this time, he has  evidence of  recurrent disease in the pelvic area. After 5 cycles of chemotherapy, his disease is rather stable based on  the CT scan that was discussed today.   He is ready to proceed with cycle 6 of chemotherapy and I am in favor of continuing chemotherapy for now.  2. Anemia. Likely multi-factorial. He has no active bleeding. No transfusion is indicated. He remains on ferrous sulfate. 3. Constipation: Improved at this time. 4. Protein calorie malnutrition: Appetite is improving.   5. Port-A-Cath management. This will be used for chemotherapy.   6. Atrial fibrillation. Anticoagulated with Coumadin. 7. Hypertension.  Normotensive without medication at this time. 8. DM. On Metformin. 9. Followup: 2 weeks for cycle 7.   Lenoard Helbert 1/8/20148:41 AM

## 2012-05-11 NOTE — Telephone Encounter (Signed)
Pt will get calendar appt from [facility] today, lab, MD and ML scheduled, emailed Marcelino Duster regarding chemo

## 2012-05-12 ENCOUNTER — Telehealth: Payer: Self-pay | Admitting: Oncology

## 2012-05-12 NOTE — Telephone Encounter (Signed)
Talked to patient and he is aware of all appt in January and February 2014 lab, MD and chemo

## 2012-05-13 ENCOUNTER — Ambulatory Visit (HOSPITAL_BASED_OUTPATIENT_CLINIC_OR_DEPARTMENT_OTHER): Payer: Medicare Other

## 2012-05-13 VITALS — BP 134/65 | HR 82 | Temp 98.3°F

## 2012-05-13 DIAGNOSIS — C20 Malignant neoplasm of rectum: Secondary | ICD-10-CM

## 2012-05-13 MED ORDER — HEPARIN SOD (PORK) LOCK FLUSH 100 UNIT/ML IV SOLN
500.0000 [IU] | Freq: Once | INTRAVENOUS | Status: AC | PRN
  Administered 2012-05-13: 500 [IU]
  Filled 2012-05-13: qty 5

## 2012-05-13 MED ORDER — SODIUM CHLORIDE 0.9 % IJ SOLN
10.0000 mL | INTRAMUSCULAR | Status: DC | PRN
  Administered 2012-05-13: 10 mL
  Filled 2012-05-13: qty 10

## 2012-05-13 NOTE — Patient Instructions (Signed)
Call MD for problems 

## 2012-05-25 ENCOUNTER — Ambulatory Visit (HOSPITAL_BASED_OUTPATIENT_CLINIC_OR_DEPARTMENT_OTHER): Payer: Medicare Other | Admitting: Oncology

## 2012-05-25 ENCOUNTER — Ambulatory Visit (HOSPITAL_BASED_OUTPATIENT_CLINIC_OR_DEPARTMENT_OTHER): Payer: Medicare Other

## 2012-05-25 ENCOUNTER — Encounter: Payer: Self-pay | Admitting: Oncology

## 2012-05-25 ENCOUNTER — Other Ambulatory Visit (HOSPITAL_BASED_OUTPATIENT_CLINIC_OR_DEPARTMENT_OTHER): Payer: Medicare Other | Admitting: Lab

## 2012-05-25 VITALS — BP 116/59 | HR 79 | Temp 97.5°F | Resp 22 | Ht 71.0 in | Wt 191.0 lb

## 2012-05-25 DIAGNOSIS — D649 Anemia, unspecified: Secondary | ICD-10-CM

## 2012-05-25 DIAGNOSIS — C20 Malignant neoplasm of rectum: Secondary | ICD-10-CM

## 2012-05-25 DIAGNOSIS — Z7901 Long term (current) use of anticoagulants: Secondary | ICD-10-CM

## 2012-05-25 DIAGNOSIS — Z5111 Encounter for antineoplastic chemotherapy: Secondary | ICD-10-CM

## 2012-05-25 DIAGNOSIS — I4891 Unspecified atrial fibrillation: Secondary | ICD-10-CM

## 2012-05-25 LAB — COMPREHENSIVE METABOLIC PANEL (CC13)
ALT: 10 U/L (ref 0–55)
AST: 12 U/L (ref 5–34)
Albumin: 2.7 g/dL — ABNORMAL LOW (ref 3.5–5.0)
Calcium: 9.6 mg/dL (ref 8.4–10.4)
Chloride: 100 mEq/L (ref 98–107)
Potassium: 3.5 mEq/L (ref 3.5–5.1)

## 2012-05-25 LAB — CBC WITH DIFFERENTIAL/PLATELET
BASO%: 0.3 % (ref 0.0–2.0)
Basophils Absolute: 0 10*3/uL (ref 0.0–0.1)
EOS%: 0.5 % (ref 0.0–7.0)
HGB: 10.7 g/dL — ABNORMAL LOW (ref 13.0–17.1)
MCH: 31.6 pg (ref 27.2–33.4)
RDW: 17.8 % — ABNORMAL HIGH (ref 11.0–14.6)
WBC: 9.6 10*3/uL (ref 4.0–10.3)
lymph#: 0.4 10*3/uL — ABNORMAL LOW (ref 0.9–3.3)

## 2012-05-25 MED ORDER — SODIUM CHLORIDE 0.9 % IV SOLN
Freq: Once | INTRAVENOUS | Status: AC
  Administered 2012-05-25: 12:00:00 via INTRAVENOUS

## 2012-05-25 MED ORDER — FLUOROURACIL CHEMO INJECTION 2.5 GM/50ML
300.0000 mg/m2 | Freq: Once | INTRAVENOUS | Status: AC
  Administered 2012-05-25: 650 mg via INTRAVENOUS
  Filled 2012-05-25: qty 13

## 2012-05-25 MED ORDER — DEXAMETHASONE SODIUM PHOSPHATE 4 MG/ML IJ SOLN
20.0000 mg | Freq: Once | INTRAMUSCULAR | Status: AC
  Administered 2012-05-25: 20 mg via INTRAVENOUS

## 2012-05-25 MED ORDER — SODIUM CHLORIDE 0.9 % IV SOLN
1800.0000 mg/m2 | INTRAVENOUS | Status: DC
  Administered 2012-05-25: 3800 mg via INTRAVENOUS
  Filled 2012-05-25: qty 76

## 2012-05-25 MED ORDER — IRINOTECAN HCL CHEMO INJECTION 100 MG/5ML
280.0000 mg | Freq: Once | INTRAVENOUS | Status: AC
  Administered 2012-05-25: 280 mg via INTRAVENOUS
  Filled 2012-05-25: qty 14

## 2012-05-25 MED ORDER — ONDANSETRON 16 MG/50ML IVPB (CHCC)
16.0000 mg | Freq: Once | INTRAVENOUS | Status: AC
  Administered 2012-05-25: 16 mg via INTRAVENOUS

## 2012-05-25 MED ORDER — ATROPINE SULFATE 0.4 MG/ML IJ SOLN
0.5000 mg | Freq: Once | INTRAMUSCULAR | Status: DC | PRN
  Filled 2012-05-25: qty 1.25

## 2012-05-25 MED ORDER — LEUCOVORIN CALCIUM INJECTION 350 MG
630.0000 mg | Freq: Once | INTRAVENOUS | Status: AC
  Administered 2012-05-25: 630 mg via INTRAVENOUS
  Filled 2012-05-25: qty 31.5

## 2012-05-25 NOTE — Patient Instructions (Addendum)
[facility] Discharge Instructions for Patients Receiving Chemotherapy  Today you received the following chemotherapy agents Camptosar/Leucovorin/5FU  To help prevent nausea and vomiting after your treatment, we encourage you to take your nausea medication as prescribed.  If you develop nausea and vomiting that is not controlled by your nausea medication, call the clinic. If it is after clinic hours your family physician or the after hours number for the clinic or go to the Emergency Department.   BELOW ARE SYMPTOMS THAT SHOULD BE REPORTED IMMEDIATELY:  *FEVER GREATER THAN 100.5 F  *CHILLS WITH OR WITHOUT FEVER  NAUSEA AND VOMITING THAT IS NOT CONTROLLED WITH YOUR NAUSEA MEDICATION  *UNUSUAL SHORTNESS OF BREATH  *UNUSUAL BRUISING OR BLEEDING  TENDERNESS IN MOUTH AND THROAT WITH OR WITHOUT PRESENCE OF ULCERS  *URINARY PROBLEMS  *BOWEL PROBLEMS  UNUSUAL RASH Items with * indicate a potential emergency and should be followed up as soon as possible.  Feel free to call the clinic you have any questions or concerns. The clinic phone number is [phone].   I have been informed and understand all the instructions given to me. I know to contact the clinic, my physician, or go to the Emergency Department if any problems should occur. I do not have any questions at this time, but understand that I may call the clinic during office hours   should I have any questions or need assistance in obtaining follow up care.    __________________________________________  _____________  __________ Signature of Patient or Authorized Representative            Date                   Time    __________________________________________ Nurse's Signature

## 2012-05-25 NOTE — Progress Notes (Signed)
Hematology and Oncology Follow Up Visit  Vincent [name] [id number] Aug 09, 1932 79 y.o. 05/25/2012 1:17 PM  CC: Vincent Heckler, MD  Vincent [name], M.D.  Vincent Boop, MD,FACG  Vincent [name], Ph.D., M.D.    Principle Diagnosis: This is a 79 year old gentleman diagnosed with adenocarcinoma of the rectum.  He had T4a N2 disease diagnosed in 2011.  He presented obstruction of the distal rectal area. Now has local relapse.    Prior Therapy: 1. Status post rectosigmoid resection and segmental resection on February 13, 2010.  Tumor was poorly differentiated adenocarcinoma, 8 out of 16 lymph nodes involved, tumor not completely resected at that time.  2. The patient received radiation therapy adamantly concomitantly with Xeloda.  Therapy concluded in January 2012.  3. Treated with adjuvant FOLFOX for a total of 7 cycles of therapy.  The therapy concluded Sep 16, 2010.  Current therapy: FOLFIRI for salvage purposes. He started FOLFIRI on 9/25. He is here for cycle 7.   Interim History:  Vincent [name] presents today for an office followup visit. He has been doing well on chemotherapy. He is s/p laparoscopy converted to an open enterolysis of adhesions on 03/15/12. No drainage noted. He has not had any abdominal pain. No nausea or vomiting. No problems with constipation.  Continues to have peripheral neuropathy that is unchanged. He denies any headaches, any visual changes, any dysphagia, odynophagia, any nausea, vomiting, diarrhea, constipation, chest pain, shortness of breath, productive cough, abdominal pain, abdominal swelling, lower extremity paresthesia, bowel or bladder incontinence, any obvious bleeding such as melena, hematochezia, or hematuria.  He denies any fevers, chills, or night sweats. Appetite is improving at this time. No new complications from chemotherapy.   Medications: I have reviewed the patient's current medications. Current outpatient prescriptions:albuterol-ipratropium (COMBIVENT)  18-103 MCG/ACT inhaler, Inhale 1 puff into the lungs every 4 (four) hours as needed. WHEEZING, Disp: , Rfl: ;  aspirin EC 81 MG tablet, Take 81 mg by mouth daily., Disp: , Rfl: ;  Fluticasone-Salmeterol (ADVAIR DISKUS) 500-50 MCG/DOSE AEPB, Inhale 1 puff into the lungs every 12 (twelve) hours., Disp: 60 each, Rfl: 3 HYDROmorphone (DILAUDID) 4 MG tablet, Take 1 tablet (4 mg total) by mouth every 6 (six) hours as needed. Pain, Disp: 30 tablet, Rfl: 0;  metFORMIN (GLUCOPHAGE) 500 MG tablet, Take 500 mg by mouth daily with breakfast. , Disp: , Rfl: ;  nitroGLYCERIN (NITROSTAT) 0.3 MG SL tablet, Place 0.3 mg under the tongue every 5 (five) minutes as needed., Disp: , Rfl:  ondansetron (ZOFRAN) 4 MG tablet, Take 1 tablet (4 mg total) by mouth every 6 (six) hours as needed for nausea., Disp: 20 tablet, Rfl: 0;  oxyCODONE (OXY IR/ROXICODONE) 5 MG immediate release tablet, Take 1 tablet (5 mg total) by mouth every 6 (six) hours as needed. Breakthrough pain, Disp: 30 tablet, Rfl: 0;  PROAIR HFA 108 (90 BASE) MCG/ACT inhaler, as needed., Disp: , Rfl:  simvastatin (ZOCOR) 40 MG tablet, Take 1 tablet (40 mg total) by mouth at bedtime., Disp: 30 tablet, Rfl: 0;  warfarin (COUMADIN) 5 MG tablet, Take 0.5 tablets (2.5 mg total) by mouth daily. Pt takes 5 mg every day except on Monday and Friday pt takes 2.5mg  ( 1/2 tab of 5 mg tablet), Disp: 30 tablet, Rfl: 0 No current facility-administered medications for this visit. Facility-Administered Medications Ordered in Other Visits: atropine injection 0.5 mg, 0.5 mg, Intravenous, Once PRN, Vincent Core, MD;  fluorouracil (ADRUCIL) 3,800 mg in sodium chloride 0.9 % 150  mL chemo infusion, 1,800 mg/m2 (Treatment Plan Actual), Intravenous, 1 day or 1 dose, Vincent Core, MD;  fluorouracil (ADRUCIL) chemo injection 650 mg, 300 mg/m2 (Treatment Plan Actual), Intravenous, Once, Vincent Core, MD irinotecan (CAMPTOSAR) 280 mg in dextrose 5 % 500 mL chemo infusion, 280 mg, Intravenous,  Once, Vincent Core, MD, Last Rate: 343 mL/hr at 05/25/12 1300, 280 mg at 05/25/12 1300;  leucovorin 630 mg in dextrose 5 % 250 mL infusion, 630 mg, Intravenous, Once, Vincent Core, MD, Last Rate: 188 mL/hr at 05/25/12 1300, 630 mg at 05/25/12 1300  Allergies: No Known Allergies  Past Medical History, Surgical history, Social history, and Family History were reviewed and updated.  Review of Systems: Constitutional:  Negative for fever, chills, night sweats, anorexia, weight loss, pain. Cardiovascular: no chest pain or dyspnea on exertion Respiratory: no cough, shortness of breath, or wheezing Neurological: no TIA or stroke symptoms Dermatological: negative ENT: negative Skin: Negative. Gastrointestinal: no abdominal pain, change in bowel habits, or black or bloody stools Genito-Urinary: no dysuria, trouble voiding, or hematuria Hematological and Lymphatic: negative Breast: negative Musculoskeletal: negative Remaining ROS negative.  Physical Exam: Blood pressure 116/59, pulse 79, temperature 97.5 F (36.4 C), temperature source Oral, resp. rate 22, height 5\' 11"  (1.803 m), weight 191 lb (86.637 kg). ECOG: 1-2 General appearance: alert Head: Normocephalic, without obvious abnormality, atraumatic Neck: no adenopathy, no carotid bruit, no JVD, supple, symmetrical, trachea midline and thyroid not enlarged, symmetric, no tenderness/mass/nodules Lymph nodes: Cervical, supraclavicular, and axillary nodes normal. Heart:regular rate and rhythm, S1, S2 normal, no murmur, click, rub or gallop Lung:chest clear, no wheezing, rales, normal symmetric air entry Abdomen: soft, non-tender, without masses or organomegaly EXT:no erythema, induration, or nodules   Lab Results: Lab Results  Component Value Date   WBC 9.6 05/25/2012   HGB 10.7* 05/25/2012   HCT 32.2* 05/25/2012   MCV 94.8 05/25/2012   PLT 237 05/25/2012    Impression and Plan: This is a pleasant 79 year old gentleman with the  following issues:   1. Advanced rectal carcinoma, presented with a T4 N2 disease.  He presented with a complete obstruction.  After segmental resection, he underwent radiation therapy with Xeloda and subsequently systemic chemotherapy.  At this time, he has  evidence of  recurrent disease in the pelvic area. After 5 cycles of chemotherapy, his disease is rather stable based on the CT scan.   He is ready to proceed with cycle 7 of chemotherapy today. 2. Anemia. Likely multi-factorial. He has no active bleeding. No transfusion is indicated. He remains on ferrous sulfate. 3. Constipation: Improved at this time. 4. Protein calorie malnutrition: Appetite is stable.   5. Port-A-Cath management. This will be used for chemotherapy.   6. Atrial fibrillation. Anticoagulated with Coumadin. 7. Hypertension.  Normotensive without medication at this time. 8. DM. On Metformin. 9. Followup: 2 weeks for cycle 8.   Clenton Pare 1/22/20141:17 PM

## 2012-05-26 ENCOUNTER — Ambulatory Visit (INDEPENDENT_AMBULATORY_CARE_PROVIDER_SITE_OTHER): Payer: Medicare Other | Admitting: Surgery

## 2012-05-26 ENCOUNTER — Encounter (INDEPENDENT_AMBULATORY_CARE_PROVIDER_SITE_OTHER): Payer: Self-pay | Admitting: Surgery

## 2012-05-26 VITALS — BP 124/62 | HR 92 | Resp 32 | Ht 71.0 in | Wt 190.0 lb

## 2012-05-26 DIAGNOSIS — K56609 Unspecified intestinal obstruction, unspecified as to partial versus complete obstruction: Secondary | ICD-10-CM

## 2012-05-26 DIAGNOSIS — C20 Malignant neoplasm of rectum: Secondary | ICD-10-CM

## 2012-05-26 NOTE — Progress Notes (Signed)
[facility]  Ovidio Kin, MD,  FACS [address] [place]  [address] [place], [place]    [address] Phone:  [phone] FAX:  [phone]   Re:   Vincent [name] DOB:   [birth year] MRN:   [id number]  ASSESSMENT AND PLAN: 1.  Small bowel obstruction  Enterolysis - D. Mattia Liford - 03/01/2012  Wound has healed.  Return appt PRN.  Either Dr. Gerrit Friends or I will be happy to assist in anyway we can.  2.  Rectal cancer.  Dr Gerrit Friends did primary resection and end colostomy on 02/13/2010.  He had 8/16 nodes positive.  He is followed by Dr. Elita Boone.  He has a pelvic recurrence of the rectal cancer. He is now on FOLFIRI for salvage purposes.  CEA - 6.8 on 05/11/2012  3.  On Coumadin for A.Fib  INR - 2.13 - 03/14/2012 4. PAF (paroxysmal atrial fibrillation)   Has seen Dr. Erlene Quan  5. HTN (hypertension)  6. AAA (abdominal aortic aneurysm) without rupture  7.  Open wound - is now healed.  HISTORY OF PRESENT ILLNESS: Chief Complaint  Patient presents with  . Follow-up    reck wound   Vincent [name] is a 79 y.o. (DOB: 1932/12/24)  white male who is a patient of BOUSKA,Ramesha Poster E, MD and comes to me today for post op follow up and wound check.  He is grumpy today.  He came by himself.  He says his wife cannot drive in this weather.  He says no one helps him.  He is pessimistic.  I asked how Christmas was and he sort of grumbled.  He saw Clenton Pare at the oncology office yesterday.  PHYSICAL EXAM: BP 124/62  Pulse 92  Resp 32  Ht 5\' 11"  (1.803 m)  Wt 190 lb (86.183 kg)  BMI 26.50 kg/m2  General: WN older WM who is alert. HEENT: Normal. Pupils equal.  Abdomen: Soft. No mass. No tenderness. Left mid abdomen ostomy.       Mid line wound - Has now healed.  He has a 1.0 cm scab, but no drainage.  DATA REVIEWED: Reviewed Dr. Wynonia Musty notes.  Ovidio Kin, MD, FACS Office:  [phone]

## 2012-05-27 ENCOUNTER — Ambulatory Visit (HOSPITAL_BASED_OUTPATIENT_CLINIC_OR_DEPARTMENT_OTHER): Payer: Medicare Other

## 2012-05-27 VITALS — BP 130/61 | HR 88 | Temp 97.4°F | Resp 18

## 2012-05-27 DIAGNOSIS — C20 Malignant neoplasm of rectum: Secondary | ICD-10-CM

## 2012-05-27 MED ORDER — SODIUM CHLORIDE 0.9 % IJ SOLN
10.0000 mL | INTRAMUSCULAR | Status: DC | PRN
  Administered 2012-05-27: 10 mL
  Filled 2012-05-27: qty 10

## 2012-05-27 MED ORDER — HEPARIN SOD (PORK) LOCK FLUSH 100 UNIT/ML IV SOLN
500.0000 [IU] | Freq: Once | INTRAVENOUS | Status: AC | PRN
  Administered 2012-05-27: 500 [IU]
  Filled 2012-05-27: qty 5

## 2012-06-02 ENCOUNTER — Emergency Department (HOSPITAL_COMMUNITY): Payer: Medicare Other

## 2012-06-02 ENCOUNTER — Encounter (HOSPITAL_COMMUNITY): Payer: Self-pay | Admitting: Emergency Medicine

## 2012-06-02 ENCOUNTER — Telehealth: Payer: Self-pay | Admitting: *Deleted

## 2012-06-02 ENCOUNTER — Inpatient Hospital Stay (HOSPITAL_COMMUNITY)
Admission: EM | Admit: 2012-06-02 | Discharge: 2012-06-09 | DRG: 190 | Disposition: A | Discharge status: Home / Self Care | Payer: Medicare Other | Attending: Internal Medicine | Admitting: Internal Medicine

## 2012-06-02 DIAGNOSIS — I4891 Unspecified atrial fibrillation: Secondary | ICD-10-CM | POA: Diagnosis present

## 2012-06-02 DIAGNOSIS — I959 Hypotension, unspecified: Secondary | ICD-10-CM

## 2012-06-02 DIAGNOSIS — J9620 Acute and chronic respiratory failure, unspecified whether with hypoxia or hypercapnia: Secondary | ICD-10-CM | POA: Diagnosis present

## 2012-06-02 DIAGNOSIS — D62 Acute posthemorrhagic anemia: Secondary | ICD-10-CM

## 2012-06-02 DIAGNOSIS — J449 Chronic obstructive pulmonary disease, unspecified: Secondary | ICD-10-CM | POA: Diagnosis present

## 2012-06-02 DIAGNOSIS — I5032 Chronic diastolic (congestive) heart failure: Secondary | ICD-10-CM | POA: Diagnosis present

## 2012-06-02 DIAGNOSIS — IMO0002 Reserved for concepts with insufficient information to code with codable children: Secondary | ICD-10-CM

## 2012-06-02 DIAGNOSIS — R112 Nausea with vomiting, unspecified: Secondary | ICD-10-CM

## 2012-06-02 DIAGNOSIS — I10 Essential (primary) hypertension: Secondary | ICD-10-CM | POA: Diagnosis present

## 2012-06-02 DIAGNOSIS — E46 Unspecified protein-calorie malnutrition: Secondary | ICD-10-CM

## 2012-06-02 DIAGNOSIS — Z7982 Long term (current) use of aspirin: Secondary | ICD-10-CM

## 2012-06-02 DIAGNOSIS — F17200 Nicotine dependence, unspecified, uncomplicated: Secondary | ICD-10-CM | POA: Diagnosis present

## 2012-06-02 DIAGNOSIS — N179 Acute kidney failure, unspecified: Secondary | ICD-10-CM

## 2012-06-02 DIAGNOSIS — J329 Chronic sinusitis, unspecified: Secondary | ICD-10-CM | POA: Diagnosis present

## 2012-06-02 DIAGNOSIS — Z720 Tobacco use: Secondary | ICD-10-CM

## 2012-06-02 DIAGNOSIS — D72819 Decreased white blood cell count, unspecified: Secondary | ICD-10-CM

## 2012-06-02 DIAGNOSIS — Z791 Long term (current) use of non-steroidal anti-inflammatories (NSAID): Secondary | ICD-10-CM

## 2012-06-02 DIAGNOSIS — D696 Thrombocytopenia, unspecified: Secondary | ICD-10-CM | POA: Diagnosis present

## 2012-06-02 DIAGNOSIS — C19 Malignant neoplasm of rectosigmoid junction: Secondary | ICD-10-CM | POA: Diagnosis present

## 2012-06-02 DIAGNOSIS — R0689 Other abnormalities of breathing: Secondary | ICD-10-CM

## 2012-06-02 DIAGNOSIS — E119 Type 2 diabetes mellitus without complications: Secondary | ICD-10-CM | POA: Diagnosis present

## 2012-06-02 DIAGNOSIS — I714 Abdominal aortic aneurysm, without rupture, unspecified: Secondary | ICD-10-CM | POA: Diagnosis present

## 2012-06-02 DIAGNOSIS — K651 Peritoneal abscess: Secondary | ICD-10-CM

## 2012-06-02 DIAGNOSIS — D649 Anemia, unspecified: Secondary | ICD-10-CM | POA: Diagnosis present

## 2012-06-02 DIAGNOSIS — J9690 Respiratory failure, unspecified, unspecified whether with hypoxia or hypercapnia: Secondary | ICD-10-CM

## 2012-06-02 DIAGNOSIS — E860 Dehydration: Secondary | ICD-10-CM

## 2012-06-02 DIAGNOSIS — Z515 Encounter for palliative care: Secondary | ICD-10-CM

## 2012-06-02 DIAGNOSIS — E871 Hypo-osmolality and hyponatremia: Secondary | ICD-10-CM | POA: Diagnosis present

## 2012-06-02 DIAGNOSIS — R531 Weakness: Secondary | ICD-10-CM | POA: Diagnosis present

## 2012-06-02 DIAGNOSIS — Z66 Do not resuscitate: Secondary | ICD-10-CM | POA: Diagnosis present

## 2012-06-02 DIAGNOSIS — C183 Malignant neoplasm of hepatic flexure: Secondary | ICD-10-CM | POA: Diagnosis present

## 2012-06-02 DIAGNOSIS — R079 Chest pain, unspecified: Secondary | ICD-10-CM

## 2012-06-02 DIAGNOSIS — Z79899 Other long term (current) drug therapy: Secondary | ICD-10-CM

## 2012-06-02 DIAGNOSIS — Z933 Colostomy status: Secondary | ICD-10-CM

## 2012-06-02 DIAGNOSIS — J322 Chronic ethmoidal sinusitis: Secondary | ICD-10-CM | POA: Diagnosis present

## 2012-06-02 DIAGNOSIS — R7989 Other specified abnormal findings of blood chemistry: Secondary | ICD-10-CM

## 2012-06-02 DIAGNOSIS — R4182 Altered mental status, unspecified: Secondary | ICD-10-CM | POA: Diagnosis present

## 2012-06-02 DIAGNOSIS — J9600 Acute respiratory failure, unspecified whether with hypoxia or hypercapnia: Secondary | ICD-10-CM

## 2012-06-02 DIAGNOSIS — I480 Paroxysmal atrial fibrillation: Secondary | ICD-10-CM | POA: Diagnosis present

## 2012-06-02 DIAGNOSIS — E876 Hypokalemia: Secondary | ICD-10-CM

## 2012-06-02 DIAGNOSIS — A0472 Enterocolitis due to Clostridium difficile, not specified as recurrent: Secondary | ICD-10-CM | POA: Insufficient documentation

## 2012-06-02 DIAGNOSIS — R4789 Other speech disturbances: Secondary | ICD-10-CM | POA: Diagnosis present

## 2012-06-02 DIAGNOSIS — E874 Mixed disorder of acid-base balance: Secondary | ICD-10-CM | POA: Diagnosis present

## 2012-06-02 DIAGNOSIS — S31109A Unspecified open wound of abdominal wall, unspecified quadrant without penetration into peritoneal cavity, initial encounter: Secondary | ICD-10-CM

## 2012-06-02 DIAGNOSIS — K56609 Unspecified intestinal obstruction, unspecified as to partial versus complete obstruction: Secondary | ICD-10-CM

## 2012-06-02 DIAGNOSIS — I509 Heart failure, unspecified: Secondary | ICD-10-CM

## 2012-06-02 DIAGNOSIS — J9602 Acute respiratory failure with hypercapnia: Secondary | ICD-10-CM

## 2012-06-02 DIAGNOSIS — C20 Malignant neoplasm of rectum: Secondary | ICD-10-CM

## 2012-06-02 DIAGNOSIS — R197 Diarrhea, unspecified: Secondary | ICD-10-CM

## 2012-06-02 DIAGNOSIS — R338 Other retention of urine: Secondary | ICD-10-CM

## 2012-06-02 DIAGNOSIS — J441 Chronic obstructive pulmonary disease with (acute) exacerbation: Principal | ICD-10-CM | POA: Diagnosis present

## 2012-06-02 DIAGNOSIS — Z7901 Long term (current) use of anticoagulants: Secondary | ICD-10-CM

## 2012-06-02 DIAGNOSIS — R109 Unspecified abdominal pain: Secondary | ICD-10-CM | POA: Diagnosis present

## 2012-06-02 HISTORY — DX: Type 2 diabetes mellitus with diabetic neuropathy, unspecified: E11.40

## 2012-06-02 HISTORY — DX: Tobacco use: Z72.0

## 2012-06-02 HISTORY — DX: Acute kidney failure, unspecified: N17.9

## 2012-06-02 HISTORY — DX: Chronic obstructive pulmonary disease, unspecified: J44.9

## 2012-06-02 LAB — BLOOD GAS, ARTERIAL
Acid-base deficit: 6.9 mmol/L — ABNORMAL HIGH (ref 0.0–2.0)
Acid-base deficit: 6 mmol/L — ABNORMAL HIGH (ref 0.0–2.0)
Bicarbonate: 21.9 mEq/L (ref 20.0–24.0)
Bicarbonate: 21.7 mEq/L (ref 20.0–24.0)
Bicarbonate: 21.9 mEq/L (ref 20.0–24.0)
Delivery systems: POSITIVE
Expiratory PAP: 5
FIO2: 0.4 %
Inspiratory PAP: 18
O2 Saturation: 96.7 %
O2 Saturation: 97.2 %
TCO2: 20.9 mmol/L (ref 0–100)
pCO2 arterial: 61.5 mmHg (ref 35.0–45.0)
pCO2 arterial: 55.8 mmHg — ABNORMAL HIGH (ref 35.0–45.0)
pCO2 arterial: 51.7 mmHg — ABNORMAL HIGH (ref 35.0–45.0)
pO2, Arterial: 112 mmHg — ABNORMAL HIGH (ref 80.0–100.0)
pO2, Arterial: 102 mmHg — ABNORMAL HIGH (ref 80.0–100.0)
pO2, Arterial: 90.4 mmHg (ref 80.0–100.0)

## 2012-06-02 LAB — URINALYSIS, MICROSCOPIC ONLY
Glucose, UA: NEGATIVE mg/dL
Ketones, ur: NEGATIVE mg/dL
Leukocytes, UA: NEGATIVE
Specific Gravity, Urine: 1.029 (ref 1.005–1.030)
pH: 5.5 (ref 5.0–8.0)

## 2012-06-02 LAB — BASIC METABOLIC PANEL
BUN: 35 mg/dL — ABNORMAL HIGH (ref 6–23)
Chloride: 101 mEq/L (ref 96–112)
GFR calc Af Amer: 50 mL/min — ABNORMAL LOW (ref >90)
Glucose, Bld: 201 mg/dL — ABNORMAL HIGH (ref 70–99)
Potassium: 4.8 mEq/L (ref 3.5–5.1)

## 2012-06-02 LAB — CBC WITH DIFFERENTIAL/PLATELET
Basophils Absolute: 0 10*3/uL (ref 0.0–0.1)
Basophils Relative: 0 % (ref 0–1)
Eosinophils Absolute: 0 10*3/uL (ref 0.0–0.7)
HCT: 38.8 % — ABNORMAL LOW (ref 39.0–52.0)
MCHC: 31.7 g/dL (ref 30.0–36.0)
Monocytes Absolute: 0.3 10*3/uL (ref 0.1–1.0)
Neutro Abs: 8 10*3/uL — ABNORMAL HIGH (ref 1.7–7.7)
Neutrophils Relative %: 87 % — ABNORMAL HIGH (ref 43–77)
RDW: 16.6 % — ABNORMAL HIGH (ref 11.5–15.5)

## 2012-06-02 LAB — COMPREHENSIVE METABOLIC PANEL
ALT: 14 U/L (ref 0–53)
Alkaline Phosphatase: 90 U/L (ref 39–117)
CO2: 23 mEq/L (ref 19–32)
Chloride: 97 mEq/L (ref 96–112)
GFR calc Af Amer: 53 mL/min — ABNORMAL LOW (ref >90)
GFR calc non Af Amer: 46 mL/min — ABNORMAL LOW (ref >90)
Glucose, Bld: 225 mg/dL — ABNORMAL HIGH (ref 70–99)
Potassium: 5 mEq/L (ref 3.5–5.1)
Sodium: 131 mEq/L — ABNORMAL LOW (ref 135–145)

## 2012-06-02 LAB — TROPONIN I
Troponin I: <0.3 ng/mL (ref <0.30)
Troponin I: <0.3 ng/mL (ref <0.30)

## 2012-06-02 LAB — DIGOXIN LEVEL: Digoxin Level: <0.3 ng/mL — ABNORMAL LOW (ref 0.8–2.0)

## 2012-06-02 LAB — GLUCOSE, CAPILLARY: Glucose-Capillary: 175 mg/dL — ABNORMAL HIGH (ref 70–99)

## 2012-06-02 LAB — INFLUENZA PANEL BY PCR (TYPE A & B)
H1N1 flu by pcr: NOT DETECTED
Influenza A By PCR: NEGATIVE
Influenza B By PCR: NEGATIVE

## 2012-06-02 LAB — LACTIC ACID, PLASMA: Lactic Acid, Venous: 1.3 mmol/L (ref 0.5–2.2)

## 2012-06-02 LAB — PROTIME-INR: INR: 4.47 — ABNORMAL HIGH (ref 0.00–1.49)

## 2012-06-02 LAB — APTT: aPTT: 69 seconds — ABNORMAL HIGH (ref 24–37)

## 2012-06-02 LAB — MRSA PCR SCREENING: MRSA by PCR: NEGATIVE

## 2012-06-02 LAB — PRO B NATRIURETIC PEPTIDE: Pro B Natriuretic peptide (BNP): 2025 pg/mL — ABNORMAL HIGH (ref 0–450)

## 2012-06-02 MED ORDER — BISOPROLOL FUMARATE 5 MG PO TABS
2.5000 mg | ORAL_TABLET | Freq: Every day | ORAL | Status: DC
  Administered 2012-06-02 – 2012-06-03 (×2): 2.5 mg via ORAL
  Filled 2012-06-02 (×3): qty 0.5

## 2012-06-02 MED ORDER — SODIUM CHLORIDE 0.9 % IJ SOLN
3.0000 mL | Freq: Two times a day (BID) | INTRAMUSCULAR | Status: DC
  Administered 2012-06-03 – 2012-06-09 (×10): 3 mL via INTRAVENOUS

## 2012-06-02 MED ORDER — ALBUTEROL SULFATE (5 MG/ML) 0.5% IN NEBU
2.5000 mg | INHALATION_SOLUTION | RESPIRATORY_TRACT | Status: DC
  Administered 2012-06-02 – 2012-06-06 (×21): 2.5 mg via RESPIRATORY_TRACT
  Filled 2012-06-02 (×21): qty 0.5

## 2012-06-02 MED ORDER — ASPIRIN EC 81 MG PO TBEC
81.0000 mg | DELAYED_RELEASE_TABLET | Freq: Every day | ORAL | Status: DC
  Administered 2012-06-02 – 2012-06-09 (×7): 81 mg via ORAL
  Filled 2012-06-02 (×8): qty 1

## 2012-06-02 MED ORDER — ONDANSETRON HCL 4 MG/2ML IJ SOLN: 4.0000 mg | Freq: Four times a day (QID) | INTRAMUSCULAR | Status: DC | PRN

## 2012-06-02 MED ORDER — SODIUM CHLORIDE 0.9 % IV SOLN
INTRAVENOUS | Status: DC
  Administered 2012-06-02 – 2012-06-03 (×2): via INTRAVENOUS

## 2012-06-02 MED ORDER — ONDANSETRON HCL 4 MG PO TABS: 4.0000 mg | ORAL_TABLET | Freq: Four times a day (QID) | ORAL | Status: DC | PRN

## 2012-06-02 MED ORDER — ALBUTEROL SULFATE (5 MG/ML) 0.5% IN NEBU: 2.5000 mg | INHALATION_SOLUTION | RESPIRATORY_TRACT | Status: DC | PRN

## 2012-06-02 MED ORDER — SODIUM CHLORIDE 0.9 % IV SOLN
INTRAVENOUS | Status: DC
  Administered 2012-06-02: 12:00:00 via INTRAVENOUS
  Administered 2012-06-04: 20 mL via INTRAVENOUS
  Administered 2012-06-06 – 2012-06-08 (×2): 20 mL/h via INTRAVENOUS

## 2012-06-02 MED ORDER — LEVOFLOXACIN IN D5W 750 MG/150ML IV SOLN
750.0000 mg | INTRAVENOUS | Status: DC
  Administered 2012-06-02 – 2012-06-06 (×3): 750 mg via INTRAVENOUS
  Filled 2012-06-02 (×4): qty 150

## 2012-06-02 MED ORDER — ALBUTEROL SULFATE (5 MG/ML) 0.5% IN NEBU: 2.5000 mg | INHALATION_SOLUTION | RESPIRATORY_TRACT | Status: DC

## 2012-06-02 MED ORDER — PANTOPRAZOLE SODIUM 40 MG PO TBEC
40.0000 mg | DELAYED_RELEASE_TABLET | Freq: Every day | ORAL | Status: DC
  Administered 2012-06-02 – 2012-06-05 (×4): 40 mg via ORAL
  Filled 2012-06-02 (×5): qty 1

## 2012-06-02 MED ORDER — SODIUM CHLORIDE 0.9 % IV BOLUS (SEPSIS): 1000.0000 mL | Freq: Once | INTRAVENOUS | Status: DC

## 2012-06-02 MED ORDER — INSULIN ASPART 100 UNIT/ML ~~LOC~~ SOLN
0.0000 [IU] | Freq: Three times a day (TID) | SUBCUTANEOUS | Status: DC
  Administered 2012-06-02: 2 [IU] via SUBCUTANEOUS
  Administered 2012-06-04: 7 [IU] via SUBCUTANEOUS

## 2012-06-02 MED ORDER — ACETAMINOPHEN 650 MG RE SUPP: 650.0000 mg | Freq: Four times a day (QID) | RECTAL | Status: DC | PRN

## 2012-06-02 MED ORDER — IPRATROPIUM BROMIDE 0.02 % IN SOLN: 0.5000 mg | RESPIRATORY_TRACT | Status: DC

## 2012-06-02 MED ORDER — DIGOXIN 125 MCG PO TABS
0.1250 mg | ORAL_TABLET | Freq: Every day | ORAL | Status: DC
Start: 2012-06-03 — End: 2012-06-02

## 2012-06-02 MED ORDER — SODIUM CHLORIDE 0.9 % IV BOLUS (SEPSIS)
1000.0000 mL | Freq: Once | INTRAVENOUS | Status: AC
  Administered 2012-06-02: 1000 mL via INTRAVENOUS

## 2012-06-02 MED ORDER — HYDROMORPHONE HCL PF 1 MG/ML IJ SOLN
1.0000 mg | INTRAMUSCULAR | Status: DC | PRN
  Administered 2012-06-04 – 2012-06-05 (×2): 1 mg via INTRAVENOUS
  Filled 2012-06-02 (×3): qty 1

## 2012-06-02 MED ORDER — INSULIN ASPART 100 UNIT/ML ~~LOC~~ SOLN
0.0000 [IU] | Freq: Every day | SUBCUTANEOUS | Status: DC
  Administered 2012-06-06: 2 [IU] via SUBCUTANEOUS

## 2012-06-02 MED ORDER — DIGOXIN 125 MCG PO TABS
0.1250 mg | ORAL_TABLET | Freq: Every day | ORAL | Status: DC
  Administered 2012-06-02 – 2012-06-08 (×7): 0.125 mg via ORAL
  Filled 2012-06-02 (×8): qty 1

## 2012-06-02 MED ORDER — METHYLPREDNISOLONE SODIUM SUCC 125 MG IJ SOLR
80.0000 mg | Freq: Two times a day (BID) | INTRAMUSCULAR | Status: DC
  Administered 2012-06-02 – 2012-06-03 (×3): 80 mg via INTRAVENOUS
  Filled 2012-06-02 (×5): qty 1.28
  Filled 2012-06-02: qty 2

## 2012-06-02 MED ORDER — ACETAMINOPHEN 325 MG PO TABS: 650.0000 mg | ORAL_TABLET | Freq: Four times a day (QID) | ORAL | Status: DC | PRN

## 2012-06-02 MED ORDER — SIMVASTATIN 40 MG PO TABS
40.0000 mg | ORAL_TABLET | Freq: Every day | ORAL | Status: DC
  Administered 2012-06-02 – 2012-06-08 (×7): 40 mg via ORAL
  Filled 2012-06-02 (×8): qty 1

## 2012-06-02 MED ORDER — IPRATROPIUM BROMIDE 0.02 % IN SOLN
0.5000 mg | RESPIRATORY_TRACT | Status: DC
  Administered 2012-06-02 – 2012-06-06 (×21): 0.5 mg via RESPIRATORY_TRACT
  Filled 2012-06-02 (×21): qty 2.5

## 2012-06-02 NOTE — Progress Notes (Signed)
ANTIBIOTIC CONSULT NOTE - INITIAL  Pharmacy Consult for:  Levaquin Indication:  COPD exacerbation  No Known Allergies  Patient Measurements: Height: 5\' 11"  (180.3 cm) Weight: 176 lb 9.4 oz (80.1 kg) IBW/kg (Calculated) : 75.3    Vital Signs: Temp: 97.5 F (36.4 C) (01/30 1530) Temp src: Axillary (01/30 1530) BP: 125/78 mmHg (01/30 1530) Pulse Rate: 116  (01/30 1545) Intake/Output from previous day:   Intake/Output from this shift: Total I/O In: -  Out: 30 [Urine:30]  Labs:  [facility] 06/02/12 1120  WBC 9.2  HGB 12.3*  PLT 263  LABCREA --  CREATININE 1.43*   Estimated Creatinine Clearance: 46.1 ml/min (by C-G formula based on Cr of 1.43). No results found for this basename: VANCOTROUGH:2,VANCOPEAK:2,VANCORANDOM:2,GENTTROUGH:2,GENTPEAK:2,GENTRANDOM:2,TOBRATROUGH:2,TOBRAPEAK:2,TOBRARND:2,AMIKACINPEAK:2,AMIKACINTROU:2,AMIKACIN:2, in the last 72 hours   Microbiology: No results found for this or any previous visit (from the past 720 hour(s)).  Medical History: Past Medical History  Diagnosis Date  . Hypertension   . Arthritis   . Heart disease   . SOB (shortness of breath)   . Malignant neoplasm of hepatic flexure   . Malignant neoplasm of rectosigmoid junction   . Cancer     and/or colon polyp  . Malignant neoplasm of hepatic flexure   . Malignant neoplasm of rectosigmoid junction   . Diabetes mellitus   . Atrial fibrillation   . Colostomy care   . AAA (abdominal aortic aneurysm) without rupture 03/02/2012  . COPD (chronic obstructive pulmonary disease)   . Tobacco abuse   . Diabetic neuropathy     Medications:  Scheduled:    . ipratropium  0.5 mg Nebulization Q4H   And  . albuterol  2.5 mg Nebulization Q4H  . aspirin EC  81 mg Oral Daily  . insulin aspart  0-5 Units Subcutaneous QHS  . insulin aspart  0-9 Units Subcutaneous TID WC  . levofloxacin (LEVAQUIN) IV  750 mg Intravenous Q48H  . methylPREDNISolone (SOLU-MEDROL) injection  80 mg Intravenous  Q12H  . pantoprazole  40 mg Oral Daily  . simvastatin  40 mg Oral QHS  . sodium chloride  1,000 mL Intravenous Once  . [COMPLETED] sodium chloride  1,000 mL Intravenous Once  . sodium chloride  3 mL Intravenous Q12H   Assessment:  Asked to assist with Levaquin therapy for this 79 year-old male with COPD exacerbation.   The dose will require modification due to CrCl < 50 ml/min.                                                        Receiving chemotherapy for adenocarcinoma of the rectum with last treatment on 05/25/12.  Goal of Therapy:  Eradication of infection  Plan:  Levaquin 750 mg IV every 48 hours.  Polo Riley R.Ph.. 06/02/2012 5:41 PM

## 2012-06-02 NOTE — H&P (Addendum)
[facility] History and Physical  ISAYAH IGNASIAK MVH:[id number] DOB: 1932/06/08 DOA: 06/02/2012  Referring physician:  Doug Sou PCP:  Aura Dials, MD  Onc:  Dr. Clelia Croft  Chief Complaint:  Confusion, weakness  HPI:  The patient is a 79 y.o. year-old male with history of COPD, atrial fibrillation, diabetes, and rectal cancer s/p colostomy with multiple episodes of SBO this year s/p ex-lap on 11/2.  He is currently receiving chemotherapy, last dose dose was on 1/22, which "wiped him out."  He has been living at home, ambulatory, and doing well/at baseline until 5 days prior to presentation.  His family contributes to the history at the patient is still confused and has bipap on.  Five days ago, his appetite decreased.  He did not complain of nausea, but he did not want to eat.  Three days prior to presentation, he developed even worse appetite, diarrhea, and some sleepiness and confusion.  His speech was slurred and he was very weak.  He has had copious clear liquid coming from ostomy, but no blood or melena.  No nausea or vomiting.  He has been having stomach pains since Monday and he has been moaning in his sleep per his family.  Family reports that the pain with worse with movement.  He had similar pain after his last ex-lap, however, he currently denies abdominal pain.  He has had a rattle in the chest and cough since this morning, but he has been wheezing and mildly Tomaz Janis of breath over the last three days.    In the ER, labs were notable for sodium 131, creatinine 1.43 (baseline 1.1), albumin 2.8, glucose 225.  CBC was at baseline.   He was somnolent and confused and was started on bipap.  Initial ABG was 7.17/62/112 on bipap 60%, consistent with mixed metabolic and respiratory acidosis, however, lactic acid was 1.3 and no gap. Follow up ABGs demonstrated slow improvement in his pH and decline in pCO2.  His mentation improved.  He was tachycardic to the 150s, a-fib with RVR, however, he  appeared very dry and his heart rate trended down with IVF to the 110s.    Review of Systems:   He has not had fevers, chills, nausea, vomiting, rhinorrhea, sinus congestion, sore throat.  He has not complained of chest pain, urinary frequency or urgency.  He has stable diabetic neuropathy of both feet, which are painful.  He has not had any abnormal bruising or bleeding or focal numbness or weakness or facial droop.    Past Medical History  Diagnosis Date  . Hypertension   . Arthritis   . Heart disease   . SOB (shortness of breath)   . Malignant neoplasm of hepatic flexure   . Malignant neoplasm of rectosigmoid junction   . Cancer     and/or colon polyp  . Malignant neoplasm of hepatic flexure   . Malignant neoplasm of rectosigmoid junction   . Diabetes mellitus   . Atrial fibrillation   . Colostomy care   . AAA (abdominal aortic aneurysm) without rupture 03/02/2012  . COPD (chronic obstructive pulmonary disease)   . Tobacco abuse   . Diabetic neuropathy   . Acute kidney injury 06/02/2012   Past Surgical History  Procedure Date  . Back surgery 1982-1983    ruptured disk  . Arthroscopic knee 1992  . Adenocarcinoma of the rectum [id number]  . Exploratory laparotomy 02/2010  . Rectosigmoid colon resection 02/2010  . Colostomy 02/2010  . Drainage of intra-abdominal abscess  02/2010  . Flexible sigmoidoscopy 12/18/2011    Procedure: FLEXIBLE SIGMOIDOSCOPY;  Surgeon: Beverley Fiedler, MD;  Location: [facility];  Service: Gastroenterology;  Laterality: N/A;  . Colon surgery 12/2011  . Cosmetic surgery   . Laparotomy 03/05/2012    Procedure: EXPLORATORY LAPAROTOMY;  Surgeon: Lodema Pilot, DO;  Location: [facility];  Service: General;  Laterality: N/A;  . Laparoscopic lysis of adhesions 03/05/2012    Procedure: LAPAROSCOPIC LYSIS OF ADHESIONS;  Surgeon: Lodema Pilot, DO;  Location: [facility];  Service: General;  Laterality: N/A;  . Back surgery 1970s    ruptured disk   Social History:  reports  that he has been smoking Cigarettes.  He has a 30 pack-year smoking history. He has never used smokeless tobacco. He reports that he does not drink alcohol or use illicit drugs. Lives at home and is normally very functional, but recnetly ahs been needing to use the walker and wheelchair.    No Known Allergies  Family History  Problem Relation Age of Onset  . Cancer Father   . Diabetes Mother   . Diabetes Son     Prior to Admission medications   Medication Sig Start Date End Date Taking? Authorizing Provider  albuterol-ipratropium (COMBIVENT) 18-103 MCG/ACT inhaler Inhale 1 puff into the lungs every 4 (four) hours as needed. WHEEZING 12/28/11  Yes Alison Murray, MD  aspirin EC 81 MG tablet Take 81 mg by mouth daily. 01/16/12 01/15/13 Yes Laveda Norman, MD  carvedilol (COREG) 3.125 MG tablet Take 3.125 mg by mouth 2 (two) times daily with a meal.   Yes Historical Provider, MD  carvedilol (COREG) 6.25 MG tablet Take 6.25 mg by mouth 2 (two) times daily with a meal.   Yes Historical Provider, MD  digoxin (LANOXIN) 0.125 MG tablet Take 0.125 mg by mouth daily.   Yes Historical Provider, MD  Fluticasone-Salmeterol (ADVAIR DISKUS) 500-50 MCG/DOSE AEPB Inhale 1 puff into the lungs every 12 (twelve) hours. 12/28/11  Yes Alison Murray, MD  HYDROmorphone (DILAUDID) 4 MG tablet Take 1 tablet (4 mg total) by mouth every 6 (six) hours as needed. Pain 03/14/12  Yes Christina P Rama, MD  lisinopril (PRINIVIL,ZESTRIL) 20 MG tablet Take 20 mg by mouth daily.   Yes Historical Provider, MD  Melatonin 3 MG TABS Take by mouth.   Yes Historical Provider, MD  metFORMIN (GLUCOPHAGE) 500 MG tablet Take 500 mg by mouth daily with breakfast.  11/12/11  Yes Historical Provider, MD  nitroGLYCERIN (NITROSTAT) 0.3 MG SL tablet Place 0.3 mg under the tongue every 5 (five) minutes as needed. 01/16/12 01/15/13 Yes Laveda Norman, MD  oxyCODONE (OXY IR/ROXICODONE) 5 MG immediate release tablet Take 1 tablet (5 mg total) by mouth every 6  (six) hours as needed. Breakthrough pain 03/14/12  Yes Christina P Rama, MD  pantoprazole (PROTONIX) 40 MG tablet Take 40 mg by mouth daily.   Yes Historical Provider, MD  simvastatin (ZOCOR) 40 MG tablet Take 1 tablet (40 mg total) by mouth at bedtime. 12/28/11  Yes Alison Murray, MD  temazepam (RESTORIL) 15 MG capsule Take 15 mg by mouth at bedtime as needed.   Yes Historical Provider, MD  warfarin (COUMADIN) 5 MG tablet Take 0.5 tablets (2.5 mg total) by mouth daily. Pt takes 5 mg every day except on Monday and Friday pt takes 2.5mg  ( 1/2 tab of 5 mg tablet) 12/28/11  Yes Alison Murray, MD  ondansetron (ZOFRAN) 4 MG tablet Take 1 tablet (4 mg  total) by mouth every 6 (six) hours as needed for nausea. 03/14/12   Maryruth Bun Rama, MD  PROAIR HFA 108 (90 BASE) MCG/ACT inhaler as needed. 02/09/12   Historical Provider, MD   Physical Exam: Filed Vitals:   06/02/12 1530 06/02/12 1545 06/02/12 1600 06/02/12 1800  BP: 125/78  128/45 110/70  Pulse:  116 111 106  Temp: 97.5 F (36.4 C)     TempSrc: Axillary     Resp: 23 20 21 22   Height: 5\' 11"  (1.803 m)     Weight: 80.1 kg (176 lb 9.4 oz)     SpO2:  100% 100% 97%     General:  Caucasian male, gray and dry skin, lying on stretcher asleep with bipap in place  Eyes:  PERRL, anicteric, non-injected.  ENT:  Nares and OP not observed due to bipap.   Neck:  Supple without TM or JVD.  Lymph:  No cervical, supraclavicular, or submandibular LAD.  Cardiovascular:  Tachycardic, IRRR, distant heart sounds and obscured somewhat by bipap.  No obvious m/r/g.  Respiratory:  Pleural rub v. Dry course rales bilaterally.  Diminished breath sounds at bases and prolonged expiratory phase.  No obvious wheezes or rhonchi.    Abdomen:  NABS.  Soft, ND/NT even with deep palpation throughout abdomen.  Colostomy with soft brown stool.    Skin:  No rashes or focal lesions, but extremely dry and peeling.  + skin tenting  Musculoskeletal:  Normal bulk and tone.  1+  BLE edema.  Psychiatric:  Asleep but arouseable.  Able to follow simple commands but then fell back asleep.  Neurologic:  No obvious facial droop.  Grip and ankle strength 5/5.  Symmetric reflexes.  Unable to determine if sensation to light touch intact at this time due to patient cooperation.    Labs on Admission:  Basic Metabolic Panel:  Lab 06/02/12 1610 06/02/12 1120  NA 133* 131*  K 4.8 5.0  CL 101 97  CO2 22 23  GLUCOSE 201* 225*  BUN 35* 30*  CREATININE 1.51* 1.43*  CALCIUM 8.8 9.1  MG -- --  PHOS -- --   Liver Function Tests:  Lab 06/02/12 1120  AST 19  ALT 14  ALKPHOS 90  BILITOT 0.2*  PROT 6.9  ALBUMIN 2.8*   No results found for this basename: LIPASE:5,AMYLASE:5 in the last 168 hours No results found for this basename: AMMONIA:5 in the last 168 hours CBC:  Lab 06/02/12 1120  WBC 9.2  NEUTROABS 8.0*  HGB 12.3*  HCT 38.8*  MCV 96.8  PLT 263   Cardiac Enzymes:  Lab 06/02/12 1744 06/02/12 1131  CKTOTAL -- --  CKMB -- --  CKMBINDEX -- --  TROPONINI <0.30 <0.30    BNP (last 3 results)  Basename 06/02/12 1131 03/13/12 0515 03/08/12 0030  PROBNP 2025.0* 2539.0* 2257.0*   CBG: No results found for this basename: GLUCAP:5 in the last 168 hours  Radiological Exams on Admission: Dg Chest Portable 1 View  06/02/2012  *RADIOLOGY REPORT*  Clinical Data: Haruto Demaria of breath.  Respiratory distress.  History of colon cancer.  PORTABLE CHEST - 1 VIEW  Comparison: Chest CT 05/10/2012.  Findings: Left subclavian Port-A-Cath is present with the tip in the mid SVC.  Right costophrenic angle is excluded from view. There is no airspace disease.  No effusion.  Cardiopericardial silhouette is within normal limits.  Severe right glenohumeral and AC joint osteoarthritis.  Left AC joint osteoarthritis. Monitoring leads are projected over the chest.  IMPRESSION: No active cardiopulmonary disease.   Original Report Authenticated By: Andreas Newport, M.D.    Telemetry  demonstrates atrial fibrillation with rate in the low 100s.    Assessment/Plan Active Problems:  Abdominal pain  Hyponatremia  Weakness generalized  Anemia  PAF (paroxysmal atrial fibrillation)  DM (diabetes mellitus)  HTN (hypertension)  Chronic diastolic CHF (congestive heart failure)  Acute respiratory failure with hypercapnia  Diarrhea  Dehydration  Acute kidney injury  Anticoagulation excessive  Acute respiratory failure with hypercapnea:  CXR appears clear, but very hyperinflated with flattened diaphragms suggestive of COPD.   -  Admit to stepdown for ongoing bipap -  Telemetry -  Cycle troponins -  Solumedrol 80mg  IV BID -  Duonebs q6h -  Albuterol prn -  Levofloxacin -  Flu PCR -  Wean to FM/[place] as tolerated  Somnolence/AMS:  Likely metabolic encephalopathy secondary to hypercapnea and moderate to severe dehydration due to recent diarrhea. -  Hydrate -  Continue bipap and tx for COPD -  Minimize narcotics and other sedating medications -  No history of liver disease  Diarrhea, appears resolved as patient has formed stool in ostomy bag.  Abdomen is very soft, not suggestive of diverticulitis or abscess.  May have had a partial SBO, infectious diarrhea that is now improving -  Send C diff if recurs -  NPO due to respiratory issues for now -  Low threshold for abdominal CT if abdominal pain   Dehydration -  IVF  Hyponatremia, hypovolemic, at baseline for patient.  May have reset osmostat/CHF and element of dehydration currently -  Hydrate   Acute kidney injury, likely prerenal given clinical appearance, but at risk for ATN.   -  Hydrate   Metabolic and respiratory acidosis:  Metabolic component is nongap and may be combination of recent diarrhea, ketosis and early RTA from ATN.  Respiratory component already improving -  ABGs prn   Weakness generalized -  Will need PT/OT assessment   Anemia, normocytic, hgb elevated from baseline of 10 likely due to  hemoconcentration  PAF (paroxysmal atrial fibrillation) with RVR in ER that responded to IVF.  supratherapeutic INR is likely due to diarrhea. -  Continue telemetry -  Warfarin per pharmacy -  Start bisoprolol (low dose) and increase as needed -  Continue digoxin    DM (diabetes mellitus) with elevated FS  -  SSI low dose and HS   HTN (hypertension), blood pressure normal to elevated but patient critically ill -  Trend   Chronic diastolic CHF (congestive heart failure), grade 1:   -  Monitor carefully for evidence of hypervolemia  Rectal cancer:   -  Please notify Dr. Clelia Croft tomorrow morning of admission  Diet:  NPO Access:  PIV IVF:  NS at 165ml/h x 15 hours  Proph:  Therapeutic warfarin  Code Status: DNR/DNI Family Communication: spoke with patient, his daughter, wife, and sister Geraldine Contras, who is his power of attorney because wife has Alzheimer's:  [phone].   Disposition Plan: admit to stepdown  Time spent: 75 min  Leonides Minder [facility] Pager [phone]  If 7PM-7AM, please contact night-coverage [email or web address] Password [facility] 06/02/2012, 7:38 PM

## 2012-06-02 NOTE — Telephone Encounter (Signed)
Daughter kim calling to say patient admitted to [facility].   ER   via ambulance, d/t SOB, pain and not eating. Note to dr Alver Fisher desk.

## 2012-06-02 NOTE — Progress Notes (Signed)
Pt asleep and on bipap when ED CM went to see him No family present at bedside

## 2012-06-02 NOTE — ED Notes (Signed)
OZH:[id number]<HQ> Expected date:<BR> Expected time:<BR> Means of arrival:<BR> Comments:<BR> Asthma wheezing nebs on board

## 2012-06-02 NOTE — ED Notes (Signed)
Pt here for sob here via ems from home given Albuterol 10mg ,atrovent 0.5mg  and Solumedrol 125mg  with some relief

## 2012-06-02 NOTE — ED Provider Notes (Addendum)
History     CSN: [id number]  Arrival date & time 06/02/12  1023   First MD Initiated Contact with Patient 06/02/12 1033      Chief Complaint  Patient presents with  . Shortness of Breath    (Consider location/radiation/quality/duration/timing/severity/associated sxs/prior treatment) HPI Level V caveat patient nonverbal. Family reports the patient has had increased difficulty breathing for approximately the past one week with diminished appetite. No vomiting. He had clear material coming out of his colostomy bag a few days ago. Patient treated by EMS with albuterol and Atrovent and Solu-Medrol IV prior to coming here accompanied Past Medical History  Diagnosis Date  . Hypertension   . Arthritis   . Heart disease   . SOB (shortness of breath)   . Malignant neoplasm of hepatic flexure   . Malignant neoplasm of rectosigmoid junction   . Cancer     and/or colon polyp  . Malignant neoplasm of hepatic flexure   . Malignant neoplasm of rectosigmoid junction   . Diabetes mellitus   . Atrial fibrillation   . Colostomy care   . AAA (abdominal aortic aneurysm) without rupture 03/02/2012    Past Surgical History  Procedure Date  . Back surgery [phone]  . Arthroscopic knee 1992  . 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 Exploratory laparotomy 02/2010  . Rectosigmoid colon resection 02/2010  . Colostomy 02/2010  . Drainage of intra-abdominal abscess 02/2010  . Flexible sigmoidoscopy 12/18/2011    Procedure: FLEXIBLE SIGMOIDOSCOPY;  Surgeon: Beverley Fiedler, MD;  Location: [facility];  Service: Gastroenterology;  Laterality: N/A;  . Colon surgery   . Cosmetic surgery   . Laparotomy 03/05/2012    Procedure: EXPLORATORY LAPAROTOMY;  Surgeon: Lodema Pilot, DO;  Location: [facility];  Service: General;  Laterality: N/A;  . Laparoscopic lysis of adhesions 03/05/2012    Procedure: LAPAROSCOPIC LYSIS OF ADHESIONS;  Surgeon: Lodema Pilot, DO;  Location: [facility];  Service: General;  Laterality:  N/A;    Family History  Problem Relation Age of Onset  . Cancer Father     History  Substance Use Topics  . Smoking status: Former Smoker -- 2.0 packs/day for 60 years    Types: Cigarettes    Quit date: 01/10/2009  . Smokeless tobacco: Never Used  . Alcohol Use: No      Review of Systems  Unable to perform ROS: Mental status change  Constitutional: Positive for appetite change.  Respiratory: Positive for shortness of breath.   Gastrointestinal: Positive for diarrhea.       Watery material in colostomy bag a few days ago    Allergies  Review of patient's allergies indicates no known allergies.  Home Medications   Current Outpatient Rx  Name  Route  Sig  Dispense  Refill  . IPRATROPIUM-ALBUTEROL 18-103 MCG/ACT IN AERO   Inhalation   Inhale 1 puff into the lungs every 4 (four) hours as needed. WHEEZING         . ASPIRIN EC 81 MG PO TBEC   Oral   Take 81 mg by mouth daily.         Marland Kitchen FLUTICASONE-SALMETEROL 500-50 MCG/DOSE IN AEPB   Inhalation   Inhale 1 puff into the lungs every 12 (twelve) hours.   60 each   3   . HYDROMORPHONE HCL 4 MG PO TABS   Oral   Take 1 tablet (4 mg total) by mouth every 6 (six) hours as needed. Pain   30 tablet   0   .  METFORMIN HCL 500 MG PO TABS   Oral   Take 500 mg by mouth daily with breakfast.          . NITROGLYCERIN 0.3 MG SL SUBL   Sublingual   Place 0.3 mg under the tongue every 5 (five) minutes as needed.         Marland Kitchen ONDANSETRON HCL 4 MG PO TABS   Oral   Take 1 tablet (4 mg total) by mouth every 6 (six) hours as needed for nausea.   20 tablet   0   . OXYCODONE HCL 5 MG PO TABS   Oral   Take 1 tablet (5 mg total) by mouth every 6 (six) hours as needed. Breakthrough pain   30 tablet   0   . PROAIR HFA 108 (90 BASE) MCG/ACT IN AERS      as needed.         Marland Kitchen SIMVASTATIN 40 MG PO TABS   Oral   Take 1 tablet (40 mg total) by mouth at bedtime.   30 tablet   0   . WARFARIN SODIUM 5 MG PO TABS   Oral    Take 0.5 tablets (2.5 mg total) by mouth daily. Pt takes 5 mg every day except on Monday and Friday pt takes 2.5mg  ( 1/2 tab of 5 mg tablet)   30 tablet   0     BP 168/74  Pulse 108  Temp 99.9 F (37.7 C) (Oral)  Resp 18  SpO2 100%  Physical Exam  Nursing note and vitals reviewed. Constitutional: He appears well-developed and well-nourished.       Acutely ill-appearing and chronically ill-appearing.   HENT:  Head: Normocephalic and atraumatic.       Next number and dry  Eyes: Conjunctivae normal are normal. Pupils are equal, round, and reactive to light.  Neck: Neck supple. No tracheal deviation present. No thyromegaly present.  Cardiovascular:  No murmur heard.      Irregularly irregular  Pulmonary/Chest: Effort normal and breath sounds normal.       Wheezing and rhonchi on expiration  Abdominal: Soft. Bowel sounds are normal. He exhibits no distension. There is no tenderness.       Ostomy in place with brown fecal material in colostomy bag  Musculoskeletal: Normal range of motion. He exhibits no edema and no tenderness.  Neurological: He is alert. Coordination normal.       Opens eyes to verbal stimulus follow simple commands, nonverbal  Skin: Skin is warm and dry. No rash noted.       Brawny skin changes lower extremities  Psychiatric: He has a normal mood and affect.   Results for orders placed during the hospital encounter of 06/02/12  COMPREHENSIVE METABOLIC PANEL      Component Value Range   Sodium 131 (*) 135 - 145 mEq/L   Potassium 5.0  3.5 - 5.1 mEq/L   Chloride 97  96 - 112 mEq/L   CO2 23  19 - 32 mEq/L   Glucose, Bld 225 (*) 70 - 99 mg/dL   BUN 30 (*) 6 - 23 mg/dL   Creatinine, Ser 1.61 (*) 0.50 - 1.35 mg/dL   Calcium 9.1  8.4 - 09.6 mg/dL   Total Protein 6.9  6.0 - 8.3 g/dL   Albumin 2.8 (*) 3.5 - 5.2 g/dL   AST 19  0 - 37 U/L   ALT 14  0 - 53 U/L   Alkaline Phosphatase 90  39 - 117  U/L   Total Bilirubin 0.2 (*) 0.3 - 1.2 mg/dL   GFR calc non Af Amer  46 (*) >90 mL/min   GFR calc Af Amer 53 (*) >90 mL/min  CBC WITH DIFFERENTIAL      Component Value Range   WBC 9.2  4.0 - 10.5 K/uL   RBC 4.01 (*) 4.22 - 5.81 MIL/uL   Hemoglobin 12.3 (*) 13.0 - 17.0 g/dL   HCT 40.9 (*) 81.1 - 91.4 %   MCV 96.8  78.0 - 100.0 fL   MCH 30.7  26.0 - 34.0 pg   MCHC 31.7  30.0 - 36.0 g/dL   RDW 78.2 (*) 95.6 - 21.3 %   Platelets 263  150 - 400 K/uL   Neutrophils Relative 87 (*) 43 - 77 %   Neutro Abs 8.0 (*) 1.7 - 7.7 K/uL   Lymphocytes Relative 9 (*) 12 - 46 %   Lymphs Abs 0.8  0.7 - 4.0 K/uL   Monocytes Relative 4  3 - 12 %   Monocytes Absolute 0.3  0.1 - 1.0 K/uL   Eosinophils Relative 0  0 - 5 %   Eosinophils Absolute 0.0  0.0 - 0.7 K/uL   Basophils Relative 0  0 - 1 %   Basophils Absolute 0.0  0.0 - 0.1 K/uL  URINALYSIS, MICROSCOPIC ONLY      Component Value Range   Color, Urine YELLOW  YELLOW   APPearance CLOUDY (*) CLEAR   Specific Gravity, Urine 1.029  1.005 - 1.030   pH 5.5  5.0 - 8.0   Glucose, UA NEGATIVE  NEGATIVE mg/dL   Hgb urine dipstick TRACE (*) NEGATIVE   Bilirubin Urine NEGATIVE  NEGATIVE   Ketones, ur NEGATIVE  NEGATIVE mg/dL   Protein, ur >086 (*) NEGATIVE mg/dL   Urobilinogen, UA 0.2  0.0 - 1.0 mg/dL   Nitrite NEGATIVE  NEGATIVE   Leukocytes, UA NEGATIVE  NEGATIVE   WBC, UA 7-10  <3 WBC/hpf   Bacteria, UA FEW (*) RARE   Squamous Epithelial / LPF RARE  RARE   Casts GRANULAR CAST (*) NEGATIVE   Urine-Other AMORPHOUS URATES/PHOSPHATES    BLOOD GAS, ARTERIAL      Component Value Range   FIO2 0.60     Delivery systems BILEVEL POSITIVE AIRWAY PRESSURE     Inspiratory PAP 16     Expiratory PAP 8     pH, Arterial 7.177 (*) 7.350 - 7.450   pCO2 arterial 61.5 (*) 35.0 - 45.0 mmHg   pO2, Arterial 112.0 (*) 80.0 - 100.0 mmHg   Bicarbonate 21.9  20.0 - 24.0 mEq/L   TCO2 20.9  0 - 100 mmol/L   Acid-base deficit 6.9 (*) 0.0 - 2.0 mmol/L   O2 Saturation 97.0     Patient temperature 98.6     Collection site RIGHT RADIAL      Drawn by [id number]     Sample type ARTERY     Allens test (pass/fail) PASS  PASS  PRO B NATRIURETIC PEPTIDE      Component Value Range   Pro B Natriuretic peptide (BNP) 2025.0 (*) 0 - 450 pg/mL  DIGOXIN LEVEL      Component Value Range   Digoxin Level <0.3 (*) 0.8 - 2.0 ng/mL  LACTIC ACID, PLASMA      Component Value Range   Lactic Acid, Venous 1.3  0.5 - 2.2 mmol/L  TROPONIN I      Component Value Range   Troponin I <0.30  <  0.30 ng/mL   Ct Chest W Contrast  05/10/2012  *RADIOLOGY REPORT*  Clinical Data:  Colon cancer status post partial colectomy and chemotherapy.  Shortness of breath.  CT CHEST, ABDOMEN AND PELVIS WITH CONTRAST  Technique:  Multidetector CT imaging of the chest, abdomen and pelvis was performed following the standard protocol during bolus administration of intravenous contrast.  Contrast: OMNIPAQUE IOHEXOL 300 MG/ML  SOLN  Comparison:  Chest CT 11/13.  Abdominal pelvic CT 03/01/2012.  CT CHEST  Findings:  Left subclavian Port-A-Cath is unchanged within the lower SVC.  There are no enlarged mediastinal, hilar or axillary lymph nodes.  Mild aortic and coronary artery atherosclerosis is present.  There is mild pericardial thickening versus focal pericardial fluid anteriorly which appears unchanged.  There is no pleural effusion.  There are stable mild emphysematous changes throughout the lungs. No pulmonary nodules are identified.  The basilar aeration has improved.  IMPRESSION:  1.  Interval resolution of pleural effusions and bibasilar atelectasis. 2.  No evidence of metastatic disease.  CT ABDOMEN AND PELVIS  Findings:  Multiple low density hepatic lesions are unchanged and likely cysts based on stability.  No new or enlarging liver lesions are identified.  The gallbladder, biliary system, pancreas, spleen and adrenal glands appear unremarkable.  There is a stable 1.3 cm left renal cyst. There is a stable 9 mm exophytic indeterminate lesion in the lower pole of the right kidney  (image 86).  The previously demonstrated small bowel obstruction has resolved. Descending colostomy appears unchanged.  There is a persistent irregular mass involving the rectal stump, appearing to extend anteriorly into the prostate gland and seminal vesicles.  Its irregular shape makes comparison limited, although slight progression is suggested (measuring approximately 7.0 x 5.1 cm on image 112).  Mild bladder wall thickening and perirectal soft tissue stranding are stable. There are no discretely enlarged abdominal pelvic lymph nodes.  Bilobed abdominal aortic aneurysm is again noted, not significantly changed.  This measures up to 4.2 cm anterior-posterior.  There is stable lumbar spondylosis.  No worrisome osseous lesions are identified.  IMPRESSION:  1.  Suspected continued slow growth of complex pelvic mass involving the rectum and prostate gland most consistent with recurrent tumor.  Correlate clinically. 2.  No distant metastases identified. 3.  Resolved small bowel obstruction.   Original Report Authenticated By: Carey Bullocks, M.D.    Ct Abdomen Pelvis W Contrast  05/10/2012  *RADIOLOGY REPORT*  Clinical Data:  Colon cancer status post partial colectomy and chemotherapy.  Shortness of breath.  CT CHEST, ABDOMEN AND PELVIS WITH CONTRAST  Technique:  Multidetector CT imaging of the chest, abdomen and pelvis was performed following the standard protocol during bolus administration of intravenous contrast.  Contrast: OMNIPAQUE IOHEXOL 300 MG/ML  SOLN  Comparison:  Chest CT 11/13.  Abdominal pelvic CT 03/01/2012.  CT CHEST  Findings:  Left subclavian Port-A-Cath is unchanged within the lower SVC.  There are no enlarged mediastinal, hilar or axillary lymph nodes.  Mild aortic and coronary artery atherosclerosis is present.  There is mild pericardial thickening versus focal pericardial fluid anteriorly which appears unchanged.  There is no pleural effusion.  There are stable mild emphysematous changes  throughout the lungs. No pulmonary nodules are identified.  The basilar aeration has improved.  IMPRESSION:  1.  Interval resolution of pleural effusions and bibasilar atelectasis. 2.  No evidence of metastatic disease.  CT ABDOMEN AND PELVIS  Findings:  Multiple low density hepatic lesions are unchanged and  likely cysts based on stability.  No new or enlarging liver lesions are identified.  The gallbladder, biliary system, pancreas, spleen and adrenal glands appear unremarkable.  There is a stable 1.3 cm left renal cyst. There is a stable 9 mm exophytic indeterminate lesion in the lower pole of the right kidney (image 86).  The previously demonstrated small bowel obstruction has resolved. Descending colostomy appears unchanged.  There is a persistent irregular mass involving the rectal stump, appearing to extend anteriorly into the prostate gland and seminal vesicles.  Its irregular shape makes comparison limited, although slight progression is suggested (measuring approximately 7.0 x 5.1 cm on image 112).  Mild bladder wall thickening and perirectal soft tissue stranding are stable. There are no discretely enlarged abdominal pelvic lymph nodes.  Bilobed abdominal aortic aneurysm is again noted, not significantly changed.  This measures up to 4.2 cm anterior-posterior.  There is stable lumbar spondylosis.  No worrisome osseous lesions are identified.  IMPRESSION:  1.  Suspected continued slow growth of complex pelvic mass involving the rectum and prostate gland most consistent with recurrent tumor.  Correlate clinically. 2.  No distant metastases identified. 3.  Resolved small bowel obstruction.   Original Report Authenticated By: Carey Bullocks, M.D.    Dg Chest Portable 1 View  06/02/2012  *RADIOLOGY REPORT*  Clinical Data: Short of breath.  Respiratory distress.  History of colon cancer.  PORTABLE CHEST - 1 VIEW  Comparison: Chest CT 05/10/2012.  Findings: Left subclavian Port-A-Cath is present with the tip  in the mid SVC.  Right costophrenic angle is excluded from view. There is no airspace disease.  No effusion.  Cardiopericardial silhouette is within normal limits.  Severe right glenohumeral and AC joint osteoarthritis.  Left AC joint osteoarthritis. Monitoring leads are projected over the chest.  IMPRESSION: No active cardiopulmonary disease.   Original Report Authenticated By: Andreas Newport, M.D.    Chest xray reviewed by me ED Course  Procedures (including critical care time)  Labs Reviewed - No data to display No results found.  Date: 06/02/2012  Rate: 150  Rhythm: atrial fibrillation  QRS Axis: normal  Intervals: normal  ST/T Wave abnormalities: nonspecific T wave changes  Conduction Disutrbances:none  Narrative Interpretation:   Old EKG Reviewed: changes noted More tachycardic than EKG from 03/08/2012 otherwise no significant change interpreted by me  No diagnosis found.  1: 15 p.m. patient's alert and arousable acts his treatment with intravenous fluids. Patient is now arousable to verbal stimulus and follow simple commands and open his eyes and is able to speak in short sentences.  MDM  Patient patient is in respiratory. Likely secondary to COPD rather than Congestive heart failure. He has a chronically elevated BNP. Influenza test pending. Patient's heart rate has improved with treatment with fluids only.Marland Kitchen He is felt to be clinically dehydrated Spoke with Dr. [name] Plan admit step down unit Diagnosis #1 respiratory failure #2 dehydration #3 renal insufficiency #4 hyperglycemia #5 metabolic acidosis 6 atrial fibrillation with rapid response    CRITICAL CARE Performed by: Doug Sou   Total critical care time: 60 minute  Critical care time was exclusive of separately billable procedures and treating other patients.  Critical care was necessary to treat or prevent imminent or life-threatening deterioration.  Critical care was time spent personally by me on the  following activities: development of treatment plan with patient and/or surrogate as well as nursing, discussions with consultants, evaluation of patient's response to treatment, examination of patient, obtaining history from patient or  surrogate, ordering and performing treatments and interventions, ordering and review of laboratory studies, ordering and review of radiographic studies, pulse oximetry and re-evaluation of patient's condition.  Doug Sou, MD 06/02/12 1343  Doug Sou, MD 06/02/12 1344

## 2012-06-02 NOTE — Progress Notes (Addendum)
ANTICOAGULATION CONSULT NOTE - Initial Consult  Pharmacy Consult for:  Coumadin Indication:  Atrial fibrillation  No Known Allergies  Patient Measurements: Height: 5\' 11"  (180.3 cm) Weight: 176 lb 9.4 oz (80.1 kg) IBW/kg (Calculated) : 75.3   Vital Signs: Temp: 97.5 F (36.4 C) (01/30 1530) Temp src: Axillary (01/30 1530) BP: 125/78 mmHg (01/30 1530) Pulse Rate: 116  (01/30 1545)  Labs:  Basename 06/02/12 1523 06/02/12 1131 06/02/12 1120  HGB -- -- 12.3*  HCT -- -- 38.8*  PLT -- -- 263  APTT 69* -- --  LABPROT 39.7* -- --  INR 4.47* -- --  HEPARINUNFRC -- -- --  CREATININE -- -- 1.43*  CKTOTAL -- -- --  CKMB -- -- --  TROPONINI -- <0.30 --    Estimated Creatinine Clearance: 46.1 ml/min (by C-G formula based on Cr of 1.43).   Medical History: Past Medical History  Diagnosis Date  . Hypertension   . Arthritis   . Heart disease   . SOB (shortness of breath)   . Malignant neoplasm of hepatic flexure   . Malignant neoplasm of rectosigmoid junction   . Cancer     and/or colon polyp  . Malignant neoplasm of hepatic flexure   . Malignant neoplasm of rectosigmoid junction   . Diabetes mellitus   . Atrial fibrillation   . Colostomy care   . AAA (abdominal aortic aneurysm) without rupture 03/02/2012  . COPD (chronic obstructive pulmonary disease)   . Tobacco abuse   . Diabetic neuropathy     Medications:  Prior to admission medication list is in progress at this time.  Assessment:  Asked to assist with Coumadin therapy for this 79 year-old male with atrial fibrillation.  Receives chronic anticoagulation at home with Coumadin.  The usual home dose was previously documented as 5 mg daily except for 2.5 mg on Mondays and Fridays.  The current instructions could not be verified.  The admission INR is supratherapeutic at 4.47.  Mr. Somera is receiving chemotherapy for adenocarcinoma of the rectum with local relapse.  The last treatment was on 05/25/12.    ED  visit is due to shortness of breath, pain, and not eating.  Goals of Therapy:   INR 2-3  Prevention of systemic thromboembolism  Plan:   No order for Coumadin today  Follow PT/INR daily  Polo Riley R.Ph. 06/02/2012 5:10 PM

## 2012-06-02 NOTE — Progress Notes (Signed)
[facility] ED CM noted Cm consult for [name] as home health agency CM reviewed previous EPIC notes to find pt was active with [name] CM called [facility] home care coordinator to discuss admission to [facility]. She will check to see if pt is "still active" and follow as needed

## 2012-06-03 ENCOUNTER — Encounter (HOSPITAL_COMMUNITY): Payer: Self-pay | Admitting: Radiology

## 2012-06-03 ENCOUNTER — Inpatient Hospital Stay (HOSPITAL_COMMUNITY): Payer: Medicare Other

## 2012-06-03 DIAGNOSIS — N179 Acute kidney failure, unspecified: Secondary | ICD-10-CM

## 2012-06-03 LAB — URINE CULTURE

## 2012-06-03 LAB — CBC
HCT: 30.8 % — ABNORMAL LOW (ref 39.0–52.0)
HCT: 31.3 % — ABNORMAL LOW (ref 39.0–52.0)
MCV: 95.7 fL (ref 78.0–100.0)
MCV: 95.4 fL (ref 78.0–100.0)
Platelets: 144 10*3/uL — ABNORMAL LOW (ref 150–400)
RBC: 3.22 MIL/uL — ABNORMAL LOW (ref 4.22–5.81)
RBC: 3.28 MIL/uL — ABNORMAL LOW (ref 4.22–5.81)
WBC: 4.8 10*3/uL (ref 4.0–10.5)
WBC: 6.4 10*3/uL (ref 4.0–10.5)

## 2012-06-03 LAB — PROTIME-INR
INR: 5.77 (ref 0.00–1.49)
Prothrombin Time: 48 seconds — ABNORMAL HIGH (ref 11.6–15.2)

## 2012-06-03 LAB — GLUCOSE, CAPILLARY: Glucose-Capillary: 158 mg/dL — ABNORMAL HIGH (ref 70–99)

## 2012-06-03 LAB — COMPREHENSIVE METABOLIC PANEL
Albumin: 2.2 g/dL — ABNORMAL LOW (ref 3.5–5.2)
BUN: 40 mg/dL — ABNORMAL HIGH (ref 6–23)
Calcium: 8.6 mg/dL (ref 8.4–10.5)
Creatinine, Ser: 1.37 mg/dL — ABNORMAL HIGH (ref 0.50–1.35)
Total Protein: 5.6 g/dL — ABNORMAL LOW (ref 6.0–8.3)

## 2012-06-03 LAB — OCCULT BLOOD X 1 CARD TO LAB, STOOL: Fecal Occult Bld: POSITIVE — AB

## 2012-06-03 LAB — TROPONIN I: Troponin I: <0.3 ng/mL (ref <0.30)

## 2012-06-03 LAB — TECHNOLOGIST SMEAR REVIEW

## 2012-06-03 MED ORDER — INSULIN ASPART 100 UNIT/ML ~~LOC~~ SOLN
0.0000 [IU] | Freq: Three times a day (TID) | SUBCUTANEOUS | Status: DC
  Administered 2012-06-03: 3 [IU] via SUBCUTANEOUS
  Administered 2012-06-03: 1 [IU] via SUBCUTANEOUS
  Administered 2012-06-03: 2 [IU] via SUBCUTANEOUS

## 2012-06-03 MED ORDER — IOHEXOL 300 MG/ML  SOLN: 25.0000 mL | INTRAMUSCULAR | Status: AC

## 2012-06-03 MED ORDER — CHLORHEXIDINE GLUCONATE 0.12 % MT SOLN
15.0000 mL | Freq: Two times a day (BID) | OROMUCOSAL | Status: DC
  Administered 2012-06-03 – 2012-06-09 (×13): 15 mL via OROMUCOSAL
  Filled 2012-06-03 (×15): qty 15

## 2012-06-03 MED ORDER — METRONIDAZOLE 500 MG PO TABS
500.0000 mg | ORAL_TABLET | Freq: Three times a day (TID) | ORAL | Status: DC
  Administered 2012-06-03 – 2012-06-07 (×12): 500 mg via ORAL
  Filled 2012-06-03 (×15): qty 1

## 2012-06-03 MED ORDER — SODIUM CHLORIDE 0.9 % IV SOLN
INTRAVENOUS | Status: DC
  Administered 2012-06-03 – 2012-06-04 (×2): via INTRAVENOUS

## 2012-06-03 MED ORDER — IOHEXOL 300 MG/ML  SOLN
100.0000 mL | Freq: Once | INTRAMUSCULAR | Status: AC | PRN
  Administered 2012-06-03: 100 mL via INTRAVENOUS

## 2012-06-03 MED ORDER — BIOTENE DRY MOUTH MT LIQD
15.0000 mL | Freq: Two times a day (BID) | OROMUCOSAL | Status: DC
  Administered 2012-06-03 – 2012-06-09 (×14): 15 mL via OROMUCOSAL

## 2012-06-03 NOTE — Evaluation (Signed)
Physical Therapy Evaluation Patient Details Name: [name] MRN: [id number] DOB: Jan 20, 1933 Today's Date: 06/03/2012 Time: 4098-1191 PT Time Calculation (min): 21 min  PT Assessment / Plan / Recommendation Clinical Impression  Pt. is 79 yo admitted 1/30 for decreased apetite, confusion mixed metabolic/respiratory acidosis. Pt. has h/o rectal cancer and s/p colostomy. Pt was ambulatory PTA. Pt. is very weak and tremulous. Pt. will benefit from PT while in acute care. Recommend HHPT if wife is able to provide care.    PT Assessment  Patient needs continued PT services    Follow Up Recommendations  Home health PT;Supervision/Assistance - 24 hour    Does the patient have the potential to tolerate intense rehabilitation      Barriers to Discharge Decreased caregiver support      Equipment Recommendations  None recommended by PT    Recommendations for Other Services     Frequency Min 3X/week    Precautions / Restrictions Precautions Precautions: Fall Precaution Comments: monitor O2 sats   Pertinent Vitals/Pain sats 91 %3 l. And dyspnea 4/4     Mobility  Bed Mobility Bed Mobility: Supine to Sit Supine to Sit: 4: Min guard;HOB elevated Details for Bed Mobility Assistance: Pt had no difficulty mobilizing self to EOB. Transfers Sit to Stand: 4: Min assist;With upper extremity assist;From bed Stand to Sit: 4: Min guard;With upper extremity assist;With armrests;To chair/3-in-1 Details for Transfer Assistance: min cues for hand placement and safety. Ambulation/Gait Ambulation/Gait Assistance: 1: +2 Total assist Ambulation/Gait: Patient Percentage: 70% Ambulation Distance (Feet): 5 Feet Assistive device: Rolling walker Ambulation/Gait Assistance Details: Pt compromised by respiratory status which limits activity level. Gait Pattern: Step-to pattern    Shoulder Instructions     Exercises     PT Diagnosis:    PT Problem List: Decreased strength;Decreased activity  tolerance;Decreased mobility;Decreased knowledge of use of DME;Decreased knowledge of precautions;Cardiopulmonary status limiting activity PT Treatment Interventions: DME instruction;Gait training;Functional mobility training;Stair training;Therapeutic activities;Therapeutic exercise;Patient/family education   PT Goals Acute Rehab PT Goals PT Goal Formulation: With patient Time For Goal Achievement: 06/17/12 Potential to Achieve Goals: Good Pt will go Supine/Side to Sit: with modified independence PT Goal: Supine/Side to Sit - Progress: Goal set today Pt will go Sit to Supine/Side: with modified independence PT Goal: Sit to Supine/Side - Progress: Goal set today Pt will go Sit to Stand: with modified independence PT Goal: Sit to Stand - Progress: Goal set today Pt will go Stand to Sit: with modified independence PT Goal: Stand to Sit - Progress: Goal set today Pt will Ambulate: 51 - 150 feet;with supervision;with rolling walker PT Goal: Ambulate - Progress: Goal set today Pt will Go Up / Down Stairs: 3-5 stairs;with supervision;with least restrictive assistive device PT Goal: Up/Down Stairs - Progress: Goal set today Pt will Perform Home Exercise Program: with supervision, verbal cues required/provided PT Goal: Perform Home Exercise Program - Progress: Goal set today  Visit Information  Last PT Received On: 06/03/12 Assistance Needed: +2 PT/OT Co-Evaluation/Treatment: Yes    Subjective Data  Subjective: I don't think I can  go that far. I will not go back to rehab. Patient Stated Goal: to go home   Prior Functioning  Home Living Lives With: Spouse Available Help at Discharge: Family Type of Home: House Home Access: Stairs to enter Secretary/administrator of Steps: 3 Entrance Stairs-Rails: Left Home Layout: One level Bathroom Shower/Tub: Health visitor: Standard Home Adaptive Equipment: Built-in shower seat;Bedside commode/3-in-1;Straight cane;Walker -  rolling;Wheelchair - manual Prior Function Level  of Independence: Needs assistance Needs Assistance: Light Housekeeping;Meal Prep Meal Prep: Total Light Housekeeping: Total Able to Take Stairs?: Yes Driving: Yes Vocation: Retired Comments: O2 at home Communication Communication: No difficulties Dominant Hand: Right    Cognition  Overall Cognitive Status: Appears within functional limits for tasks assessed/performed Arousal/Alertness: Awake/alert Orientation Level: Appears intact for tasks assessed Behavior During Session: [facility] for tasks performed    Extremity/Trunk Assessment Right Upper Extremity Assessment RUE ROM/Strength/Tone: [facility] for tasks assessed Left Upper Extremity Assessment LUE ROM/Strength/Tone: WFL for tasks assessed Right Lower Extremity Assessment RLE ROM/Strength/Tone: Deficits RLE ROM/Strength/Tone Deficits: grossly WFL for stnding taking a few steps. RLE Sensation: History of peripheral neuropathy Left Lower Extremity Assessment LLE ROM/Strength/Tone: Deficits LLE ROM/Strength/Tone Deficits: grossly  WFL. for standing,  LLE Sensation: History of peripheral neuropathy   Balance Static Sitting Balance Static Sitting - Balance Support: No upper extremity supported;Feet supported Static Sitting - Level of Assistance: 6: Modified independent (Device/Increase time)  End of Session PT - End of Session Activity Tolerance: Patient limited by fatigue;Treatment limited secondary to medical complications (Comment) (respiratory) Nurse Communication: Mobility status  GP     [name] 06/03/2012, 9:56 AM  [phone]

## 2012-06-03 NOTE — Evaluation (Signed)
Occupational Therapy Evaluation Patient Details Name: Vincent [name] MRN: [id number] DOB: 27-Feb-1933 Today's Date: 06/03/2012 Time: 0272-5366 OT Time Calculation (min): 28 min  OT Assessment / Plan / Recommendation Clinical Impression  The patient is a 79 y.o. year-old male with history of COPD, atrial fibrillation, diabetes, and rectal cancer s/p colostomy with multiple episodes of SBO this year s/p ex-lap on 11/2. Pt mobilized well on 2L of O2 with SaO2 remaining in the 90s. Skillled OT indicated to maximize independence with BADLs to supervision level in prep for d/c home.    OT Assessment  Patient needs continued OT Services    Follow Up Recommendations  Home health OT    Barriers to Discharge      Equipment Recommendations  None recommended by OT    Recommendations for Other Services    Frequency  Min 2X/week    Precautions / Restrictions Precautions Precautions: Fall Precaution Comments: monitor O2 sats   Pertinent Vitals/Pain Pt denied pain    ADL  Grooming: Wash/dry face;Set up Where Assessed - Grooming: Unsupported sitting Upper Body Bathing: Set up Where Assessed - Upper Body Bathing: Unsupported sitting Lower Body Bathing: Minimal assistance Where Assessed - Lower Body Bathing: Supported sit to stand Upper Body Dressing: Set up Where Assessed - Upper Body Dressing: Unsupported sitting Lower Body Dressing: Moderate assistance Where Assessed - Lower Body Dressing: Supported sit to Pharmacist, hospital: Minimal assistance Statistician Method: Sit to Barista: Other (comment) (recliner) Toileting - Clothing Manipulation and Hygiene: Minimal assistance Where Assessed - Toileting Clothing Manipulation and Hygiene: Standing Transfers/Ambulation Related to ADLs: Pt only tolerated a few steps to the chair. Sat EOB ~8 minutes. ADL Comments: Pt with UE tremor that worsens with activity. Fatigues very quickly. Sats remained stable with  activity. Pt has had a colostomy bag that he reports he changes on his own at home.    OT Diagnosis: Generalized weakness  OT Problem List: Decreased activity tolerance;Decreased knowledge of use of DME or AE OT Treatment Interventions: Self-care/ADL training;Therapeutic activities;DME and/or AE instruction;Patient/family education   OT Goals Acute Rehab OT Goals OT Goal Formulation: With patient Time For Goal Achievement: 06/17/12 Potential to Achieve Goals: Good ADL Goals Pt Will Perform Grooming: with supervision;Standing at sink ADL Goal: Grooming - Progress: Goal set today Pt Will Transfer to Toilet: with supervision;Ambulation;with DME ADL Goal: Toilet Transfer - Progress: Goal set today Pt Will Perform Toileting - Clothing Manipulation: with supervision;Sitting on 3-in-1 or toilet;Standing ADL Goal: Toileting - Clothing Manipulation - Progress: Goal set today Pt Will Perform Toileting - Hygiene: with supervision;Sit to stand from 3-in-1/toilet ADL Goal: Toileting - Hygiene - Progress: Goal set today Additional ADL Goal #1: Pt will complete all aspects of LB bathing and dressing with supervision taking prn RBs without prompting. ADL Goal: Additional Goal #1 - Progress: Goal set today  Visit Information  Last OT Received On: 06/03/12 Assistance Needed: +1 PT/OT Co-Evaluation/Treatment: Yes    Subjective Data  Subjective: I'm not going to a nursing home. Patient Stated Goal: Eventually return home.   Prior Functioning     Home Living Lives With: Spouse Available Help at Discharge: Family Type of Home: House Home Access: Stairs to enter Secretary/administrator of Steps: 3 Entrance Stairs-Rails: Left Home Layout: One level Bathroom Shower/Tub: Health visitor: Standard Home Adaptive Equipment: Built-in shower seat;Bedside commode/3-in-1;Straight cane;Walker - rolling;Wheelchair - manual Prior Function Level of Independence: Needs assistance Needs  Assistance: Light Housekeeping;Meal Prep Meal Prep: Total Light  Housekeeping: Total Able to Take Stairs?: Yes Driving: Yes Vocation: Retired Comments: O2 at home Communication Communication: No difficulties Dominant Hand: Right         Vision/Perception     Cognition  Overall Cognitive Status: Appears within functional limits for tasks assessed/performed Arousal/Alertness: Awake/alert Orientation Level: Appears intact for tasks assessed Behavior During Session: [facility] for tasks performed    Extremity/Trunk Assessment Right Upper Extremity Assessment RUE ROM/Strength/Tone: [facility] for tasks assessed Left Upper Extremity Assessment LUE ROM/Strength/Tone: WFL for tasks assessed     Mobility Bed Mobility Bed Mobility: Supine to Sit Supine to Sit: 4: Min guard;HOB elevated Details for Bed Mobility Assistance: Pt had no difficulty mobilizing self to EOB. Transfers Transfers: Sit to Stand;Stand to Sit Sit to Stand: 4: Min assist;With upper extremity assist;From bed Stand to Sit: 4: Min guard;With upper extremity assist;With armrests;To chair/3-in-1 Details for Transfer Assistance: min cues for hand placement and safety.     Shoulder Instructions     Exercise     Balance Static Sitting Balance Static Sitting - Balance Support: No upper extremity supported;Feet supported Static Sitting - Level of Assistance: 6: Modified independent (Device/Increase time)   End of Session OT - End of Session Activity Tolerance: Patient limited by fatigue Patient left: in chair;with call bell/phone within reach  GO     Demarri [name] A OTR/L [phone] 06/03/2012, 9:42 AM

## 2012-06-03 NOTE — Progress Notes (Signed)
CRITICAL VALUE ALERT  Critical value received:  INR 5.77  Date of notification:  06/03/2012  Time of notification:  10:35  Critical value read back: yes  Nurse who received alert:  Vernell Leep RN  MD notified (1st page): Renae Fickle  Time of first page:  11:15  MD notified (2nd page):  Time of second page:  Responding MD:  Renae Fickle  Time MD responded:  11:20

## 2012-06-03 NOTE — Progress Notes (Signed)
[id number]/Vlada Uriostegui, RN, BSN, CCM:  CHART REVIEWED AND UPDATED.  Next chart review due on [id number]. NO DISCHARGE NEEDS PRESENT AT THIS TIME. CASE MANAGEMENT [phone] 

## 2012-06-03 NOTE — Progress Notes (Addendum)
ANTICOAGULATION CONSULT NOTE - Follow Up  Pharmacy Consult for:  Coumadin Indication:  Atrial fibrillation  No Known Allergies  Patient Measurements: Height: 5\' 11"  (180.3 cm) Weight: 176 lb 9.4 oz (80.1 kg) IBW/kg (Calculated) : 75.3   Vital Signs: Temp: 97.3 F (36.3 C) (01/31 0400) Temp src: Axillary (01/31 0400) BP: 100/54 mmHg (01/31 0700) Pulse Rate: 88  (01/31 0700)  Labs:  Basename 06/03/12 0540 06/02/12 2350 06/02/12 1744 06/02/12 1523 06/02/12 1120  HGB 9.8* -- -- -- 12.3*  HCT 30.8* -- -- -- 38.8*  PLT 137* -- -- -- 263  APTT -- -- -- 69* --  LABPROT -- -- -- 39.7* --  INR -- -- -- 4.47* --  HEPARINUNFRC -- -- -- -- --  CREATININE 1.37* -- 1.51* -- 1.43*  CKTOTAL -- -- -- -- --  CKMB -- -- -- -- --  TROPONINI <0.30 <0.30 <0.30 -- --    Estimated Creatinine Clearance: 48.1 ml/min (by C-G formula based on Cr of 1.37).  Pertinent Medications:  Aspirin 81mg  PO daily Levaquin 750mg  IV q48h   Assessment:  79 year-old male admitted 1/20 with CC: confusion and weakness.   PMH significant for: atrial fibrillation, rectal CA s/p colostomy, last chemo 05/25/12, AAA without rupture (02/2012)  Receives chronic anticoagulation at home with Coumadin.  The usual home dose was previously documented as 5 mg daily except for 2.5 mg on Mondays and Fridays.  The current instructions could not be verified.  Admission INR was supratherapeutic at 4.47.  Current INR being drawn now - no bleeding reported in chart notes.  Goals of Therapy:   INR 2-3  Plan:   Will f/u when results on INR available  Darrol Angel, PharmD Pager: [phone] 06/03/2012 9:15 AM  ADDENDUM INR remains supratherapeutic (5.77) and rising. No bleeding reported in chart notes. Will continue to hold warfarin until INR <3.   Darrol Angel, PharmD Pager: [phone] 06/03/2012 10:51 AM

## 2012-06-03 NOTE — Progress Notes (Signed)
[facility] PROGRESS NOTE  RAJENDRA SPILLER ZOX:[id number] DOB: 1932-07-24 DOA: 06/02/2012 PCP: Aura Dials, MD  Assessment/Plan  Acute respiratory failure with hypercapnea: CXR appears clear, but very hyperinflated with flattened diaphragms suggestive of COPD.  Continues to have shortness of breath and diminished breath sounds - troponins neg - Continue Solumedrol 80mg  IV BID  - Duonebs q6h  - Albuterol prn  - Levofloxacin  - Flu PCR neg - Wean to nasal canula this morning  Somnolence/AMS: Likely metabolic encephalopathy secondary to hypercapnea and moderate to severe dehydration due to recent diarrhea.  Markedly improved this morning after hydration and bipap - D/C continuous bipap - Minimize narcotics and other sedating medications  - No history of liver disease   Diarrhea, yesterday had formed stool, but now having liquid stool in ostomy.  May have had a partial SBO, infectious diarrhea.   - C diff, stool culture, and fecal lactoferrin - Clear liquid diet  - abdominal CT if able to lie flat  Dehydration, appears resolved and pt euvolemic. - d/c IVF   Hyponatremia, hypovolemic, at baseline for patient. May have reset osmostat/CHF and element of dehydration currently.  Increased modestly with hydration.   - trend sodium  Acute kidney injury, likely prerenal given clinical appearance, but at risk for ATN.  BUN rising despite IVF, although creatinine trending down. - Monitor I/O carefully this morning and if poor oral intake will restart IVF.    Metabolic and respiratory acidosis: Metabolic component is nongap and may be combination of recent diarrhea, ketosis and early RTA from ATN. Respiratory component already improving.  Bicarb 23 this morning.   -  Start diet  Weakness generalized  - PT/OT assessment   Anemia, normocytic, hgb elevated from baseline of 10 likely due to hemoconcentration.  Trended down to 9.8 this morning, which is closer to  baseline Thrombocytopenia, new and significant decline since yesterday (263 > 137).  DDx includes DIC, TTP, HUS (given profuse diarrhea and AKI although rare in adults).  No heparin administered.  Rule out GIB -  Occult stool -  INR elevated but likely due to warfarin -  Haptoglobin/LDH -  Smear review -  Fibrinogen -  Repeat CBC  PAF (paroxysmal atrial fibrillation) with RVR in ER that responded to IVF. supratherapeutic INR is likely due to diarrhea.  HR in 80s this morning.   - Continue telemetry  - Warfarin per pharmacy  - Continue bisoprolol - Continue digoxin   DM (diabetes mellitus) with elevated FS and on steroids - Increase SSI to mod dose and continue HS   HTN (hypertension), blood pressure low normal to hypotensive - Trend   Chronic diastolic CHF (congestive heart failure), grade 1:  - Monitor carefully for evidence of hypervolemia  -  Strict I/O  Rectal cancer:  - Please notify Dr. Clelia Croft tomorrow morning of admission   Diet: NPO  Access: PIV  IVF:  OFF Proph: Therapeutic warfarin   Code Status: DNR/DNI  Family Communication: spoke with patient, his daughter, wife, and sister Geraldine Contras, yesterday, who is his power of attorney because wife has Alzheimer's: [phone].  Disposition Plan:  Continue monitoring in stepdown   Consultants:  none  Procedures:  none  Antibiotics:  Levofloxacin   HPI/Subjective:  Patient states that he feels much better this morning.  He still feels Loni Abdon of breath, but states he always breaths through his mouth and has pursed lip breathing.  Denies N/V.  Has had liquid stool in his ostomy and some mucous from  the rectum per nursing report.  Denies chest pain.    Objective: Filed Vitals:   06/03/12 0443 06/03/12 0444 06/03/12 0500 06/03/12 0700  BP:  90/47 98/57 100/54  Pulse:  77 79 88  Temp:      TempSrc:      Resp:  20 19 20   Height:      Weight:      SpO2: 100% 100% 100% 99%    Intake/Output Summary (Last 24 hours)  at 06/03/12 0818 Last data filed at 06/03/12 0700  Gross per 24 hour  Intake   1710 ml  Output    675 ml  Net   1035 ml   Filed Weights   06/02/12 1530  Weight: 80.1 kg (176 lb 9.4 oz)    Exam:   General:  Thin CM, moderate respiratory distress with SCM and subcostal/intercostal retractions and pursed lip breathing  HEENT:  MMM  Cardiovascular:  IRRR, distant heart sounds, no obvious murmur, 2+ pulses  Respiratory:  Diminished bilateral breath sounds with scattered course rales, absent to very high pitched wheeze throughout and very prolonged expiratory phase.  Forced expiratory phase.    Abdomen:  NABS, soft, ND/NT, ostomy with liquid stool.    MSK:  Normal tone and bulk, 1+ lower extremity edema  Skin:  Normal skin turgor   Neuro:  Alert, quick to answer questions, but difficulty with Ryelan Kazee term memory.  No focal deficits.  No slurred speech.   Data Reviewed: Basic Metabolic Panel:  Lab 06/03/12 7846 06/02/12 1744 06/02/12 1120  NA 134* 133* 131*  K 4.7 4.8 5.0  CL 104 101 97  CO2 23 22 23   GLUCOSE 144* 201* 225*  BUN 40* 35* 30*  CREATININE 1.37* 1.51* 1.43*  CALCIUM 8.6 8.8 9.1  MG -- -- --  PHOS -- -- --   Liver Function Tests:  Lab 06/03/12 0540 06/02/12 1120  AST 18 19  ALT 14 14  ALKPHOS 65 90  BILITOT 0.2* 0.2*  PROT 5.6* 6.9  ALBUMIN 2.2* 2.8*   No results found for this basename: LIPASE:5,AMYLASE:5 in the last 168 hours No results found for this basename: AMMONIA:5 in the last 168 hours CBC:  Lab 06/03/12 0540 06/02/12 1120  WBC 4.8 9.2  NEUTROABS -- 8.0*  HGB 9.8* 12.3*  HCT 30.8* 38.8*  MCV 95.7 96.8  PLT 137* 263   Cardiac Enzymes:  Lab 06/03/12 0540 06/02/12 2350 06/02/12 1744 06/02/12 1131  CKTOTAL -- -- -- --  CKMB -- -- -- --  CKMBINDEX -- -- -- --  TROPONINI <0.30 <0.30 <0.30 <0.30   BNP (last 3 results)  Basename 06/02/12 1131 03/13/12 0515 03/08/12 0030  PROBNP 2025.0* 2539.0* 2257.0*   CBG:  Lab 06/03/12 0800  06/02/12 2219 06/02/12 1813  GLUCAP 145* 175* 177*    Recent Results (from the past 240 hour(s))  MRSA PCR SCREENING     Status: Normal   Collection Time   06/02/12  4:12 PM      Component Value Range Status Comment   MRSA by PCR NEGATIVE  NEGATIVE Final      Studies: Dg Chest Portable 1 View  06/02/2012  *RADIOLOGY REPORT*  Clinical Data: Kamir Selover of breath.  Respiratory distress.  History of colon cancer.  PORTABLE CHEST - 1 VIEW  Comparison: Chest CT 05/10/2012.  Findings: Left subclavian Port-A-Cath is present with the tip in the mid SVC.  Right costophrenic angle is excluded from view. There is no airspace disease.  No  effusion.  Cardiopericardial silhouette is within normal limits.  Severe right glenohumeral and AC joint osteoarthritis.  Left AC joint osteoarthritis. Monitoring leads are projected over the chest.  IMPRESSION: No active cardiopulmonary disease.   Original Report Authenticated By: Andreas Newport, M.D.     Scheduled Meds:   . ipratropium  0.5 mg Nebulization Q4H   And  . albuterol  2.5 mg Nebulization Q4H  . antiseptic oral rinse  15 mL Mouth Rinse q12n4p  . aspirin EC  81 mg Oral Daily  . bisoprolol  2.5 mg Oral Daily  . chlorhexidine  15 mL Mouth Rinse BID  . digoxin  0.125 mg Oral Daily  . insulin aspart  0-5 Units Subcutaneous QHS  . insulin aspart  0-9 Units Subcutaneous TID WC  . levofloxacin (LEVAQUIN) IV  750 mg Intravenous Q48H  . methylPREDNISolone (SOLU-MEDROL) injection  80 mg Intravenous Q12H  . pantoprazole  40 mg Oral Daily  . simvastatin  40 mg Oral QHS  . sodium chloride  1,000 mL Intravenous Once  . sodium chloride  3 mL Intravenous Q12H   Continuous Infusions:   . sodium chloride 10 mL/hr at 06/02/12 1138  . sodium chloride 125 mL/hr at 06/03/12 0416    Active Problems:  Abdominal pain  Hyponatremia  Weakness generalized  Anemia  PAF (paroxysmal atrial fibrillation)  DM (diabetes mellitus)  HTN (hypertension)  Chronic diastolic CHF  (congestive heart failure)  Acute respiratory failure with hypercapnia  Diarrhea  Dehydration  Acute kidney injury  Anticoagulation excessive  Tobacco abuse    Time spent: 30 min    [facility], [facility]  [facility] Pager [phone]. If 8PM-8AM, please contact night-coverage at [email or web address], password [facility] 06/03/2012, 8:18 AM  LOS: 1 day

## 2012-06-03 NOTE — Progress Notes (Signed)
CRITICAL VALUE ALERT  Critical value received:  c diff positive  Date of notification:  1/31   Time of notification:  1300  Critical value read back: yes  Nurse who received alert:  Vernell Leep RN  MD notified (1st page):  Renae Fickle  Time of first page:  1300  MD notified (2nd page):  Time of second page:  Responding MD:  Renae Fickle  Time MD responded:  [phone]

## 2012-06-04 LAB — BASIC METABOLIC PANEL
BUN: 42 mg/dL — ABNORMAL HIGH (ref 6–23)
Calcium: 8.7 mg/dL (ref 8.4–10.5)
Creatinine, Ser: 1.35 mg/dL (ref 0.50–1.35)
GFR calc Af Amer: 57 mL/min — ABNORMAL LOW (ref >90)
GFR calc non Af Amer: 49 mL/min — ABNORMAL LOW (ref >90)

## 2012-06-04 LAB — FECAL LACTOFERRIN, QUANT: Fecal Lactoferrin: POSITIVE

## 2012-06-04 LAB — MAGNESIUM: Magnesium: 1.9 mg/dL (ref 1.5–2.5)

## 2012-06-04 LAB — CBC
HCT: 29.8 % — ABNORMAL LOW (ref 39.0–52.0)
MCHC: 31.2 g/dL (ref 30.0–36.0)
MCV: 95.8 fL (ref 78.0–100.0)
RDW: 16.8 % — ABNORMAL HIGH (ref 11.5–15.5)

## 2012-06-04 LAB — PRO B NATRIURETIC PEPTIDE: Pro B Natriuretic peptide (BNP): 3452 pg/mL — ABNORMAL HIGH (ref 0–450)

## 2012-06-04 LAB — GLUCOSE, CAPILLARY: Glucose-Capillary: 150 mg/dL — ABNORMAL HIGH (ref 70–99)

## 2012-06-04 MED ORDER — ZOLPIDEM TARTRATE 5 MG PO TABS
5.0000 mg | ORAL_TABLET | Freq: Once | ORAL | Status: AC
  Administered 2012-06-04: 5 mg via ORAL
  Filled 2012-06-04: qty 1

## 2012-06-04 MED ORDER — BISOPROLOL FUMARATE 5 MG PO TABS
5.0000 mg | ORAL_TABLET | Freq: Every day | ORAL | Status: DC
  Administered 2012-06-04 – 2012-06-09 (×6): 5 mg via ORAL
  Filled 2012-06-04 (×7): qty 1

## 2012-06-04 MED ORDER — PHYTONADIONE 5 MG PO TABS
2.5000 mg | ORAL_TABLET | Freq: Once | ORAL | Status: AC
  Administered 2012-06-04: 2.5 mg via ORAL
  Filled 2012-06-04: qty 1

## 2012-06-04 MED ORDER — INSULIN ASPART 100 UNIT/ML ~~LOC~~ SOLN
0.0000 [IU] | Freq: Three times a day (TID) | SUBCUTANEOUS | Status: DC
  Administered 2012-06-04: 15 [IU] via SUBCUTANEOUS
  Administered 2012-06-05: 4 [IU] via SUBCUTANEOUS
  Administered 2012-06-05: 08:00:00 via SUBCUTANEOUS
  Administered 2012-06-05 – 2012-06-06 (×2): 4 [IU] via SUBCUTANEOUS
  Administered 2012-06-06: 7 [IU] via SUBCUTANEOUS
  Administered 2012-06-06: 4 [IU] via SUBCUTANEOUS
  Administered 2012-06-07: 3 [IU] via SUBCUTANEOUS
  Administered 2012-06-07: 7 [IU] via SUBCUTANEOUS
  Administered 2012-06-07 – 2012-06-08 (×2): 3 [IU] via SUBCUTANEOUS
  Administered 2012-06-08 – 2012-06-09 (×2): 4 [IU] via SUBCUTANEOUS

## 2012-06-04 MED ORDER — METHYLPREDNISOLONE SODIUM SUCC 125 MG IJ SOLR
80.0000 mg | Freq: Every day | INTRAMUSCULAR | Status: DC
  Administered 2012-06-04 – 2012-06-05 (×2): 80 mg via INTRAVENOUS
  Filled 2012-06-04 (×2): qty 1.28

## 2012-06-04 MED ORDER — ZOLPIDEM TARTRATE 10 MG PO TABS: 10.0000 mg | ORAL_TABLET | Freq: Once | ORAL | Status: DC

## 2012-06-04 NOTE — Progress Notes (Signed)
Discussed patient safety, use of call bell before attempting to get up.  Patient states he previously watched safety video.

## 2012-06-04 NOTE — Progress Notes (Signed)
Per ICU nurse, patient received insulin coverage for lunch.

## 2012-06-04 NOTE — Progress Notes (Signed)
ANTICOAGULATION CONSULT NOTE - Follow Up  Pharmacy Consult for:  Coumadin Indication:  Atrial fibrillation  No Known Allergies  Patient Measurements: Height: 5\' 11"  (180.3 cm) Weight: 183 lb 3.2 oz (83.1 kg) IBW/kg (Calculated) : 75.3   Vital Signs: Temp: 97.5 F (36.4 C) (02/01 0800) Temp src: Core (Comment) (02/01 0400) BP: 100/45 mmHg (02/01 0500) Pulse Rate: 75  (02/01 0500)  Labs:  Basename 06/04/12 0515 06/03/12 0930 06/03/12 0540 06/02/12 2350 06/02/12 1744 06/02/12 1523  HGB 9.3* 10.0* -- -- -- --  HCT 29.8* 31.3* 30.8* -- -- --  PLT 138* 144* 137* -- -- --  APTT -- -- -- -- -- 69*  LABPROT 58.9* 48.0* -- -- -- 39.7*  INR 7.60* 5.77* -- -- -- 4.47*  HEPARINUNFRC -- -- -- -- -- --  CREATININE 1.35 -- 1.37* -- 1.51* --  CKTOTAL -- -- -- -- -- --  CKMB -- -- -- -- -- --  TROPONINI -- -- <0.30 <0.30 <0.30 --    Estimated Creatinine Clearance: 48.8 ml/min (by C-G formula based on Cr of 1.35).  Pertinent Medications:  1/30 >> Aspirin 81mg  PO daily >> 1/30 >> Levaquin 750mg  IV q48h >> 1/31 >> Metronidazole 500mg  po TID >> 2/1 >> Vit K 2.5mg  PO x1  Assessment:  79 year-old male admitted 1/20 with CC: confusion and weakness.   PMH significant for: atrial fibrillation, rectal CA s/p colostomy, last chemo 05/25/12, AAA without rupture (02/2012)  Receives chronic anticoagulation at home with Coumadin.  The usual home dose was previously documented as 5 mg daily except for 2.5 mg on Mondays and Fridays.  The current instructions could not be verified.  Admission INR was supratherapeutic. INR today remains supratherapeutic and rising. No warfarin has been given in hospital this admission. INR elevation could be exasperated by drug interaction with Levaquin, change in dietary habits, and/or diarrhea.  No bleeding reported in chart notes  MD has ordered PO Vit K 2.5mg  x1 to be given today.  Goals of Therapy:   INR 2-3  Plan:   Continue to hold warfarin  Plan to  resume warfarin dosing once INR <3  F/U daily PT/INR  Darrol Angel, PharmD Pager: [phone] 06/04/2012 9:13 AM

## 2012-06-04 NOTE — Progress Notes (Signed)
Placed pt on bipap for rest, autobilevel mode, max ipap=20cm, min epap=5cm with 2l o2 bleedin.  Pt is wearing a full face mask as he wears this at home.  Pt is tolerating the bipap well at this time.  RN notified.  Hr 80, sats 98%. Sterile water added to max fill line of humidity chamber.

## 2012-06-04 NOTE — Progress Notes (Signed)
  Echocardiogram 2D Echocardiogram has been performed.  Vincent [name] 06/04/2012, 9:35 AM

## 2012-06-04 NOTE — Plan of Care (Signed)
Problem: Phase III Progression Outcomes Goal: Foley discontinued Outcome: Completed/Met Date Met:  06/04/12 Discontinue at 1130 by ICU nurse

## 2012-06-04 NOTE — Progress Notes (Addendum)
[facility] PROGRESS NOTE  Vincent [name] YNW:[id number] DOB: 1932-07-13 DOA: 06/02/2012 PCP: Aura Dials, MD  Assessment/Plan  Acute respiratory failure with hypercapnea: CXR appears clear, but very hyperinflated with flattened diaphragms suggestive of COPD.  Continues to have shortness of breath and full expiratory wheeze, but WOB has improved - troponins neg - Decrease Solumedrol 80mg  IV to once daily - Duonebs q6h  - Albuterol prn  - Levofloxacin  - Flu PCR neg - Continue nasal canula at home dose 2L with home bipap overnight  -  Add on BNP after giving IVF and ECHO  Somnolence/AMS: Likely metabolic encephalopathy secondary to hypercapnea and moderate to severe dehydration due to recent diarrhea.  Resolved.   - Minimize narcotics and other sedating medications   C. Diff Diarrhea, mild, with persistent liquid stools -  Flagyl, day 2   - C diff -  F/u stool culture - Advance diet  - abdominal CT neg for obstruction  Dehydration, pt euvolemic. - d/c IVF   Hyponatremia, hypovolemic, at baseline for patient. May have reset osmostat/CHF and element of dehydration currently.  Increased modestly with hydration.   - trend sodium  Acute kidney injury, likely prerenal given clinical appearance, but at risk for ATN.  BUN rising despite IVF, although creatinine trending down. - monitor i/o carefully and restart IVF prn    Weakness generalized  - PT/OT recommending home health PT/OT  Anemia, normocytic, hgb near baseline.  Occult stool positive in the setting of c. Diff, but no gross blood. -  Trend hgb -  Tx for hgb < 7  Thrombocytopenia, new and significant decline since yesterday (263 > 137).  DDx includes DIC, TTP, HUS (given profuse diarrhea and AKI although rare in adults).  No heparin administered.  Rule out GIB -  Occult stool positive -  INR elevated but likely due to warfarin -  Haptoglobin/LDH wnl -  Smear review without schistocytes -  Fibrinogen elevated -   Repeat CBC with stable plt and hgb  Elevated INR is likely secondary to diarrhea in the setting of warfarin use.  Rising despite holding warfarin -  Vitamin K 2.5mg  once   PAF (paroxysmal atrial fibrillation) with RVR in ER that responded to IVF. supratherapeutic INR is likely due to diarrhea.  HR in 80s this morning.  Had episode of NSVT this AM - Continue telemetry  - Increase bisoprolol - Continue digoxin  -  Add on magnesium to AML  DM (diabetes mellitus) with elevated FS and on steroids - Increase SSI to high dose and continue HS   HTN (hypertension), blood pressure low normal to hypotensive, but improving - Trend   Chronic diastolic CHF (congestive heart failure), grade 1:  - Monitor carefully for evidence of hypervolemia  -  Strict I/O  Rectal cancer:  - Dr. Clelia Croft was at conference on Friday.    Diet:  Diabetic diet Access: PIV  IVF:  OFF Proph: Therapeutic warfarin   Code Status: DNR/DNI  Family Communication: spoke with patient alone today.  Sister Geraldine Contras is his power of attorney because wife has Alzheimer's: [phone].  Disposition Plan:  Transfer to telemetry    Consultants:  none  Procedures:  none  Antibiotics:  Levofloxacin 1/30 >>  Flagyl 1/31 >>  HPI/Subjective:  Patient states that he feels much better this morning.  Vincent [name] of breath is improved.  Denies cough.  Denies N/V.  Has had liquid stool in his ostomy.  Denies chest pain.    Objective: Filed Vitals:  06/04/12 0000 06/04/12 0019 06/04/12 0400 06/04/12 0500  BP: 101/48  84/59 100/45  Pulse: 83  92 75  Temp: 97.8 F (36.6 C)  97 F (36.1 C)   TempSrc: Oral  Core (Comment)   Resp: 20  26 24   Height:      Weight:   83.1 kg (183 lb 3.2 oz)   SpO2: 99% 100% 99% 98%    Intake/Output Summary (Last 24 hours) at 06/04/12 0847 Last data filed at 06/04/12 0600  Gross per 24 hour  Intake   2065 ml  Output   1210 ml  Net    855 ml   Filed Weights   06/02/12 1530 06/04/12 0400   Weight: 80.1 kg (176 lb 9.4 oz) 83.1 kg (183 lb 3.2 oz)    Exam:   General:  Thin CM, mild respiratory distress with SCM and subcostal retractions.   HEENT:  MMM  Cardiovascular:  IRRR, distant heart sounds, no obvious murmur, 2+ pulses  Respiratory:  Diminished bilateral breath sounds with scattered course rales, high pitched wheeze throughout and prolonged expiratory phase.  Forced expiratory phase.    Abdomen:  NABS, soft, ND/NT, ostomy with liquid stool.    MSK:  Normal tone and bulk, 1+ lower extremity edema  Skin:  Normal skin turgor   Neuro:  Alert, quick to answer questions.  No focal deficits.  No slurred speech.   Data Reviewed: Basic Metabolic Panel:  Lab 06/04/12 1610 06/03/12 0540 06/02/12 1744 06/02/12 1120  NA 134* 134* 133* 131*  K 4.8 4.7 4.8 5.0  CL 104 104 101 97  CO2 23 23 22 23   GLUCOSE 165* 144* 201* 225*  BUN 42* 40* 35* 30*  CREATININE 1.35 1.37* 1.51* 1.43*  CALCIUM 8.7 8.6 8.8 9.1  MG -- -- -- --  PHOS -- -- -- --   Liver Function Tests:  Lab 06/03/12 0540 06/02/12 1120  AST 18 19  ALT 14 14  ALKPHOS 65 90  BILITOT 0.2* 0.2*  PROT 5.6* 6.9  ALBUMIN 2.2* 2.8*   No results found for this basename: LIPASE:5,AMYLASE:5 in the last 168 hours No results found for this basename: AMMONIA:5 in the last 168 hours CBC:  Lab 06/04/12 0515 06/03/12 0930 06/03/12 0540 06/02/12 1120  WBC 5.3 6.4 4.8 9.2  NEUTROABS -- -- -- 8.0*  HGB 9.3* 10.0* 9.8* 12.3*  HCT 29.8* 31.3* 30.8* 38.8*  MCV 95.8 95.4 95.7 96.8  PLT 138* 144* 137* 263   Cardiac Enzymes:  Lab 06/03/12 0540 06/02/12 2350 06/02/12 1744 06/02/12 1131  CKTOTAL -- -- -- --  CKMB -- -- -- --  CKMBINDEX -- -- -- --  TROPONINI <0.30 <0.30 <0.30 <0.30   BNP (last 3 results)  Basename 06/02/12 1131 03/13/12 0515 03/08/12 0030  PROBNP 2025.0* 2539.0* 2257.0*   CBG:  Lab 06/03/12 2126 06/03/12 1736 06/03/12 1136 06/03/12 0800 06/02/12 2219  GLUCAP 158* 192* 124* 145* 175*     Recent Results (from the past 240 hour(s))  URINE CULTURE     Status: Normal   Collection Time   06/02/12 11:40 AM      Component Value Range Status Comment   Specimen Description URINE, CATHETERIZED   Final    Special Requests NONE   Final    Culture  Setup Time 06/02/2012 16:04   Final    Colony Count NO GROWTH   Final    Culture NO GROWTH   Final    Report Status 06/03/2012 FINAL  Final   MRSA PCR SCREENING     Status: Normal   Collection Time   06/02/12  4:12 PM      Component Value Range Status Comment   MRSA by PCR NEGATIVE  NEGATIVE Final   CLOSTRIDIUM DIFFICILE BY PCR     Status: Abnormal   Collection Time   06/03/12  9:30 AM      Component Value Range Status Comment   C difficile by pcr POSITIVE (*) NEGATIVE Final      Studies: Ct Abdomen Pelvis W Contrast  06/03/2012  *RADIOLOGY REPORT*  Clinical Data: Abdominal pain. Colon cancer, chemotherapy in progress.  CT ABDOMEN AND PELVIS WITH CONTRAST  Technique:  Multidetector CT imaging of the abdomen and pelvis was performed following the standard protocol during bolus administration of intravenous contrast.  Contrast: OMNIPAQUE IOHEXOL 300 MG/ML  SOLN  Comparison: 05/10/2012  Findings: Multiple low-density lesions throughout the liver are again noted and unchanged.  These likely represent cysts.  No enlarging lesions.  Gallbladder, spleen, pancreas, adrenals and kidneys are unremarkable.  Stomach and small bowel grossly unremarkable.  Again noted is the irregular mixed solid and cystic mass at the rectal stump, likely extending into the prostate gland and seminal vesicles.  This measures 7.4 x 4.9 cm compared to 7.0 x 5.1 cm previously.  Slight increased central low density suggests necrosis.  Rectal wall and presacral stranding again noted, stable.  No discrete enlarged lymph nodes.  No free fluid or free air. Bilobed 4.3 cm infrarenal abdominal aortic aneurysm.  Foley catheter is present in the bladder which is  decompressed, containing a small amount of gas.  Left lower quadrant ostomy is noted.  Left-sided colonic diverticula.  No active diverticulitis.  No acute bony abnormality.  IMPRESSION: Large rectal stump necrotic appearing mass again noted with probable invasion/involvement of the prostate and seminal vesicles. Extensive perirectal and presacral stranding.  Overall size of the mass slightly larger although this may be technical given the Idamay Hosein- term follow-up.  No evidence of bowel obstruction.  Stable hepatic low density lesions, likely small cysts.  Stable abdominal aortic aneurysm.   Original Report Authenticated By: Charlett Nose, M.D.    Dg Chest Portable 1 View  06/02/2012  *RADIOLOGY REPORT*  Clinical Data: Rayleen Wyrick of breath.  Respiratory distress.  History of colon cancer.  PORTABLE CHEST - 1 VIEW  Comparison: Chest CT 05/10/2012.  Findings: Left subclavian Port-A-Cath is present with the tip in the mid SVC.  Right costophrenic angle is excluded from view. There is no airspace disease.  No effusion.  Cardiopericardial silhouette is within normal limits.  Severe right glenohumeral and AC joint osteoarthritis.  Left AC joint osteoarthritis. Monitoring leads are projected over the chest.  IMPRESSION: No active cardiopulmonary disease.   Original Report Authenticated By: Andreas Newport, M.D.     Scheduled Meds:    . ipratropium  0.5 mg Nebulization Q4H   And  . albuterol  2.5 mg Nebulization Q4H  . antiseptic oral rinse  15 mL Mouth Rinse q12n4p  . aspirin EC  81 mg Oral Daily  . bisoprolol  5 mg Oral Daily  . chlorhexidine  15 mL Mouth Rinse BID  . digoxin  0.125 mg Oral Daily  . insulin aspart  0-15 Units Subcutaneous TID WC  . insulin aspart  0-5 Units Subcutaneous QHS  . levofloxacin (LEVAQUIN) IV  750 mg Intravenous Q48H  . methylPREDNISolone (SOLU-MEDROL) injection  80 mg Intravenous Daily  . metroNIDAZOLE  500 mg  Oral Q8H  . pantoprazole  40 mg Oral Daily  . phytonadione  2.5 mg Oral  Once  . simvastatin  40 mg Oral QHS  . sodium chloride  1,000 mL Intravenous Once  . sodium chloride  3 mL Intravenous Q12H   Continuous Infusions:    . sodium chloride 10 mL/hr at 06/02/12 1138    Principal Problem:  *Acute respiratory failure with hypercapnia Active Problems:  Abdominal pain  Thrombocytopenia  Hyponatremia  Weakness generalized  Anemia  PAF (paroxysmal atrial fibrillation)  DM (diabetes mellitus)  HTN (hypertension)  Chronic diastolic CHF (congestive heart failure)  Diarrhea  Dehydration  Acute kidney injury  Anticoagulation excessive  Tobacco abuse    Time spent: 30 min    Wren Gallaga, [facility]  [facility] Pager [phone]. If 8PM-8AM, please contact night-coverage at [email or web address], password [facility] 06/04/2012, 8:47 AM  LOS: 2 days

## 2012-06-05 ENCOUNTER — Inpatient Hospital Stay (HOSPITAL_COMMUNITY): Payer: Medicare Other

## 2012-06-05 ENCOUNTER — Other Ambulatory Visit (HOSPITAL_COMMUNITY): Payer: Medicare Other

## 2012-06-05 DIAGNOSIS — A0472 Enterocolitis due to Clostridium difficile, not specified as recurrent: Secondary | ICD-10-CM

## 2012-06-05 DIAGNOSIS — I10 Essential (primary) hypertension: Secondary | ICD-10-CM

## 2012-06-05 DIAGNOSIS — J449 Chronic obstructive pulmonary disease, unspecified: Secondary | ICD-10-CM | POA: Diagnosis present

## 2012-06-05 LAB — BASIC METABOLIC PANEL
BUN: 46 mg/dL — ABNORMAL HIGH (ref 6–23)
Creatinine, Ser: 1.46 mg/dL — ABNORMAL HIGH (ref 0.50–1.35)
GFR calc Af Amer: 52 mL/min — ABNORMAL LOW (ref >90)
GFR calc non Af Amer: 45 mL/min — ABNORMAL LOW (ref >90)
Glucose, Bld: 164 mg/dL — ABNORMAL HIGH (ref 70–99)

## 2012-06-05 LAB — CBC
HCT: 29.2 % — ABNORMAL LOW (ref 39.0–52.0)
Hemoglobin: 9.4 g/dL — ABNORMAL LOW (ref 13.0–17.0)
MCHC: 32.2 g/dL (ref 30.0–36.0)
MCV: 95.7 fL (ref 78.0–100.0)
RDW: 16.7 % — ABNORMAL HIGH (ref 11.5–15.5)

## 2012-06-05 LAB — GLUCOSE, CAPILLARY
Glucose-Capillary: 189 mg/dL — ABNORMAL HIGH (ref 70–99)
Glucose-Capillary: 169 mg/dL — ABNORMAL HIGH (ref 70–99)

## 2012-06-05 MED ORDER — OXYMETAZOLINE HCL 0.05 % NA SOLN
1.0000 | Freq: Two times a day (BID) | NASAL | Status: AC
  Administered 2012-06-05 – 2012-06-07 (×5): 1 via NASAL
  Filled 2012-06-05: qty 15

## 2012-06-05 MED ORDER — PANTOPRAZOLE SODIUM 40 MG PO TBEC
40.0000 mg | DELAYED_RELEASE_TABLET | Freq: Two times a day (BID) | ORAL | Status: DC
  Administered 2012-06-05 – 2012-06-06 (×3): 40 mg via ORAL
  Filled 2012-06-05 (×4): qty 1

## 2012-06-05 MED ORDER — METHYLPREDNISOLONE SODIUM SUCC 125 MG IJ SOLR
80.0000 mg | Freq: Two times a day (BID) | INTRAMUSCULAR | Status: DC
  Administered 2012-06-05 – 2012-06-06 (×2): 80 mg via INTRAVENOUS
  Filled 2012-06-05 (×3): qty 1.28

## 2012-06-05 MED ORDER — SALINE SPRAY 0.65 % NA SOLN
1.0000 | NASAL | Status: DC | PRN
  Administered 2012-06-06: 1 via NASAL
  Filled 2012-06-05: qty 44

## 2012-06-05 MED ORDER — CLONAZEPAM 0.5 MG PO TABS
0.5000 mg | ORAL_TABLET | Freq: Two times a day (BID) | ORAL | Status: DC
  Administered 2012-06-05 – 2012-06-06 (×2): 0.5 mg via ORAL
  Filled 2012-06-05 (×2): qty 1

## 2012-06-05 MED ORDER — WARFARIN - PHARMACIST DOSING INPATIENT: Freq: Every day | Status: DC

## 2012-06-05 MED ORDER — FLUTICASONE PROPIONATE 50 MCG/ACT NA SUSP
2.0000 | Freq: Every day | NASAL | Status: DC
  Administered 2012-06-05 – 2012-06-09 (×5): 2 via NASAL
  Filled 2012-06-05 (×2): qty 16

## 2012-06-05 MED ORDER — DM-GUAIFENESIN ER 30-600 MG PO TB12
2.0000 | ORAL_TABLET | Freq: Two times a day (BID) | ORAL | Status: DC
  Administered 2012-06-05 – 2012-06-09 (×8): 2 via ORAL
  Filled 2012-06-05 (×9): qty 2

## 2012-06-05 MED ORDER — BUDESONIDE 0.5 MG/2ML IN SUSP
0.5000 mg | Freq: Two times a day (BID) | RESPIRATORY_TRACT | Status: DC
  Administered 2012-06-05 – 2012-06-09 (×8): 0.5 mg via RESPIRATORY_TRACT
  Filled 2012-06-05 (×12): qty 2

## 2012-06-05 MED ORDER — WARFARIN SODIUM 2.5 MG PO TABS
2.5000 mg | ORAL_TABLET | Freq: Once | ORAL | Status: AC
  Administered 2012-06-05: 2.5 mg via ORAL
  Filled 2012-06-05: qty 1

## 2012-06-05 NOTE — Progress Notes (Signed)
Patient refusing to get up states he is too tired and short of breath.

## 2012-06-05 NOTE — Progress Notes (Signed)
Pt seen, found already wearing bipap for rest with 2l o2 bleedin.  Pt is wearing a full face mask as he wears this at home.  Settings autobilevel, max ipap=20cm, min epap=5cm.  Pt is tolerating well at this time.  HR 80, sats 89-90%.  Sterile water was already added to max fill line of humidity chamber.

## 2012-06-05 NOTE — Plan of Care (Signed)
Problem: Phase I Progression Outcomes Goal: OOB as tolerated unless otherwise ordered Outcome: Not Progressing Refusing to get OOB to chair due to SOB with exertion

## 2012-06-05 NOTE — Progress Notes (Signed)
ANTIBIOTIC CONSULT NOTE - FOLLOW UP  Pharmacy Consult for Levaquin Indication: COPD Exacerbation  No Known Allergies  Patient Measurements: Height: 5\' 11"  (180.3 cm) Weight: 178 lb 9.6 oz (81.012 kg) IBW/kg (Calculated) : 75.3   Labs:  Basename 06/05/12 0500 06/04/12 0515 06/03/12 0930 06/03/12 0540  WBC 6.5 5.3 6.4 --  HGB 9.4* 9.3* 10.0* --  PLT 138* 138* 144* --  LABCREA -- -- -- --  CREATININE 1.46* 1.35 -- 1.37*   Estimated Creatinine Clearance: 45.1 ml/min (by C-G formula based on Cr of 1.46).   Microbiology: 1/30 urine: NG(f) 1/30 mrsa crp (-) 1/31 C.diff (+) 1/31 stool - pending  Anti-infectives     Start     Dose/Rate Route Frequency Ordered Stop   06/03/12 1430   metroNIDAZOLE (FLAGYL) tablet 500 mg        500 mg Oral 3 times per day 06/03/12 1353 06/17/12 1359   06/02/12 1800   levofloxacin (LEVAQUIN) IVPB 750 mg        750 mg 100 mL/hr over 90 Minutes Intravenous Every 48 hours 06/02/12 1738            Assessment: 79 yo M admitted 06/02/12 with COPD exacerbation. Today is Day #4 IV Levaquin for COPD and Day #3 PO flagyl (per MD) for C.diff. Patient is receiving chemotherapy for adenocarcinoma of the rectum with last treatment on 05/25/12. Pt is afebrile, WBC 6.5, CrCl~73ml/min. Levaquin dose remains appropriate.  Plan:  1) No changes to Levaquin at this time 2) F/U planned duration of therapy  Darrol Angel, PharmD Pager: [phone] 06/05/2012,9:24 AM

## 2012-06-05 NOTE — Progress Notes (Signed)
[facility] PROGRESS NOTE  Vincent [name] GMW:[id number] DOB: [birth year] DOA: 06/02/2012 PCP: Vincent Dials, MD  Assessment/Plan  Acute respiratory failure with hypercapnea: CXR appears clear, but very hyperinflated with flattened diaphragms suggestive of COPD.  Continues to have shortness of breath and full expiratory wheeze, now refusing to get out of bed due to dyspnea with exertion s/p  - troponins neg - Continue Solumedrol -  Add budesonide - Duonebs q4h  - Albuterol prn  - Levofloxacin  - Flu PCR neg - Continue nasal canula at home dose 2L with home bipap overnight  -  BNP mildly elevated from baseline, ECHO demonstrates preserved EF, but could not comment on diastolic dysfunction.   -  D/c IVF -  Start nasal saline, flonase, afrin -  Repeat CXR -  Pulmonary consultation to assist with improving dyspnea  C. Diff Diarrhea, mild, with persistent liquid stools -  Flagyl, day 3 - C diff -  F/u stool culture - abdominal CT neg for obstruction  Hyponatremia, hypovolemic, at baseline for patient. May have reset osmostat.  Stable.    Acute kidney injury, likely prerenal with ATN due to persistent severe dehydration.  Baseline Cr 0.9-1.  BUN:Cr stable to mildly elevated.  Much less likely, consider P-R syndrome - d/c IVF due to elevated BNP - Consider nephrology consultation.    Weakness generalized  - PT/OT recommending home health PT/OT  Anemia, normocytic, hgb near baseline.  Occult stool positive in the setting of c. Diff, but no gross blood. -  Tx for hgb < 7  Thrombocytopenia, stable.  No heparin administered.   -  Occult stool positive, but no gross blood -  INR elevated but likely due to warfarin -  Haptoglobin/LDH wnl -  Smear review without schistocytes -  Fibrinogen elevated -  Repeat CBC with stable plt and hgb  Elevated INR is likely secondary to diarrhea in the setting of warfarin use. Trended down quickly after holding warfarin for several days and  administering small dose of vitamin K -  Warfarin per pharmacy  PAF (paroxysmal atrial fibrillation) with RVR in ER that responded to IVF. supratherapeutic INR is likely due to diarrhea.  HR in 80s this morning.  Has had a couple episodes of nonsustained V-tach, up to 16 beats.   - Continue telemetry  -  Continue bisoprolol - Continue digoxin  -  Electrolytes are stable.    DM (diabetes mellitus) with elevated FS and on steroids, improved FS today - Continue high dose SSI  HTN (hypertension), blood pressure low normal to hypotensive, but stable - Trend   Chronic diastolic CHF (congestive heart failure), grade 1:  - Monitor carefully for evidence of hypervolemia  -  Strict I/O  Rectal cancer:  Dr. Clelia [name] was at conference on Friday.    Diet:  Diabetic diet Access: PIV  IVF:  OFF Proph:  Near-therapeutic warfarin   Code Status: DNR/DNI  Family Communication:  Spoke with patient and with his daughter Vincent [name] today.  Sister Vincent [name] is his power of attorney because wife has Alzheimer's: [phone].   Disposition Plan:  Continue telemetry    Consultants:  none  Procedures:  none  Antibiotics:  Levofloxacin 1/30 >>  Flagyl 1/31 >>  HPI/Subjective:  Patient states that he feels still very Vincent [name] of breath.  Denies cough.  Denies N/V.  Has had liquid stool in his ostomy, but less volume.  Denies chest pain.  Feels like he is going to pass out with minimal exertion.  Objective: Filed Vitals:   06/04/12 2329 06/05/12 0315 06/05/12 0524 06/05/12 0746  BP:   106/51   Pulse:      Temp:   97.3 F (36.3 C)   TempSrc:   Axillary   Resp:   18   Height:      Weight:   81.012 kg (178 lb 9.6 oz)   SpO2: 98% 95% 97% 96%    Intake/Output Summary (Last 24 hours) at 06/05/12 1159 Last data filed at 06/05/12 1100  Gross per 24 hour  Intake 1070.67 ml  Output    955 ml  Net 115.67 ml   Filed Weights   06/02/12 1530 06/04/12 0400 06/05/12 0524  Weight: 80.1 kg (176 lb 9.4 oz)  83.1 kg (183 lb 3.2 oz) 81.012 kg (178 lb 9.6 oz)    Exam:   General:  Thin CM, mild respiratory distress with SCM and subcostal retractions and pursed lip breathing  HEENT:  MMM  Cardiovascular:  IRRR, distant heart sounds, no obvious murmur, 2+ pulses  Respiratory:  Diminished bilateral breath sounds with scattered course rales, very high pitched wheeze throughout and prolonged expiratory phase.  Forced expiratory phase.    Abdomen:  NABS, soft, ND/NT, ostomy with liquid stool.    MSK:  Normal tone and bulk, trace lower extremity edema  Skin:  Normal skin turgor   Neuro:  Alert, quick to answer questions.  No focal deficits.  No slurred speech.   Data Reviewed: Basic Metabolic Panel:  Lab 06/05/12 1478 06/04/12 0515 06/03/12 0540 06/02/12 1744 06/02/12 1120  NA 134* 134* 134* 133* 131*  K 4.5 4.8 4.7 4.8 5.0  CL 105 104 104 101 97  CO2 23 23 23 22 23   GLUCOSE 164* 165* 144* 201* 225*  BUN 46* 42* 40* 35* 30*  CREATININE 1.46* 1.35 1.37* 1.51* 1.43*  CALCIUM 9.0 8.7 8.6 8.8 9.1  MG 1.9 1.9 -- -- --  PHOS -- -- -- -- --   Liver Function Tests:  Lab 06/03/12 0540 06/02/12 1120  AST 18 19  ALT 14 14  ALKPHOS 65 90  BILITOT 0.2* 0.2*  PROT 5.6* 6.9  ALBUMIN 2.2* 2.8*   No results found for this basename: LIPASE:5,AMYLASE:5 in the last 168 hours No results found for this basename: AMMONIA:5 in the last 168 hours CBC:  Lab 06/05/12 0500 06/04/12 0515 06/03/12 0930 06/03/12 0540 06/02/12 1120  WBC 6.5 5.3 6.4 4.8 9.2  NEUTROABS -- -- -- -- 8.0*  HGB 9.4* 9.3* 10.0* 9.8* 12.3*  HCT 29.2* 29.8* 31.3* 30.8* 38.8*  MCV 95.7 95.8 95.4 95.7 96.8  PLT 138* 138* 144* 137* 263   Cardiac Enzymes:  Lab 06/03/12 0540 06/02/12 2350 06/02/12 1744 06/02/12 1131  CKTOTAL -- -- -- --  CKMB -- -- -- --  CKMBINDEX -- -- -- --  TROPONINI <0.30 <0.30 <0.30 <0.30   BNP (last 3 results)  Basename 06/04/12 0515 06/02/12 1131 03/13/12 0515  PROBNP 3452.0* 2025.0* 2539.0*    CBG:  Lab 06/05/12 1126 06/05/12 0748 06/04/12 2111 06/04/12 1716 06/04/12 1138  GLUCAP 169* 149* 189* 120* 339*    Recent Results (from the past 240 hour(s))  URINE CULTURE     Status: Normal   Collection Time   06/02/12 11:40 AM      Component Value Range Status Comment   Specimen Description URINE, CATHETERIZED   Final    Special Requests NONE   Final    Culture  Setup Time 06/02/2012 16:04  Final    Colony Count NO GROWTH   Final    Culture NO GROWTH   Final    Report Status 06/03/2012 FINAL   Final   MRSA PCR SCREENING     Status: Normal   Collection Time   06/02/12  4:12 PM      Component Value Range Status Comment   MRSA by PCR NEGATIVE  NEGATIVE Final   STOOL CULTURE     Status: Normal (Preliminary result)   Collection Time   06/03/12  9:30 AM      Component Value Range Status Comment   Specimen Description STOOL   Final    Special Requests Normal   Final    Culture Culture reincubated for better growth   Final    Report Status PENDING   Incomplete   CLOSTRIDIUM DIFFICILE BY PCR     Status: Abnormal   Collection Time   06/03/12  9:30 AM      Component Value Range Status Comment   C difficile by pcr POSITIVE (*) NEGATIVE Final      Studies: No results found.  Scheduled Meds:    . ipratropium  0.5 mg Nebulization Q4H   And  . albuterol  2.5 mg Nebulization Q4H  . antiseptic oral rinse  15 mL Mouth Rinse q12n4p  . aspirin EC  81 mg Oral Daily  . bisoprolol  5 mg Oral Daily  . chlorhexidine  15 mL Mouth Rinse BID  . digoxin  0.125 mg Oral Daily  . fluticasone  2 spray Each Nare Daily  . insulin aspart  0-20 Units Subcutaneous TID WC  . insulin aspart  0-5 Units Subcutaneous QHS  . levofloxacin (LEVAQUIN) IV  750 mg Intravenous Q48H  . methylPREDNISolone (SOLU-MEDROL) injection  80 mg Intravenous Daily  . metroNIDAZOLE  500 mg Oral Q8H  . oxymetazoline  1 spray Each Nare BID  . pantoprazole  40 mg Oral Daily  . simvastatin  40 mg Oral QHS  . sodium  chloride  1,000 mL Intravenous Once  . sodium chloride  3 mL Intravenous Q12H  . warfarin  2.5 mg Oral ONCE-1800  . Warfarin - Pharmacist Dosing Inpatient   Does not apply q1800   Continuous Infusions:    . sodium chloride 20 mL/hr at 06/04/12 1700    Principal Problem:  *Acute respiratory failure with hypercapnia Active Problems:  Abdominal pain  Thrombocytopenia  Hyponatremia  Weakness generalized  Anemia  PAF (paroxysmal atrial fibrillation)  DM (diabetes mellitus)  HTN (hypertension)  Chronic diastolic CHF (congestive heart failure)  C. difficile diarrhea  Dehydration  Acute kidney injury  Anticoagulation excessive  Tobacco abuse    Time spent: 30 min    Eleftheria Taborn  [facility] Pager [phone]. If 7PM-7AM, please contact night-coverage at [email or web address], password [facility] 06/05/2012, 11:59 AM  LOS: 3 days

## 2012-06-05 NOTE — Progress Notes (Signed)
Attempted to get patient down to x-ray when order placed.  Patient lunch arrived and he wanted to eat.

## 2012-06-05 NOTE — Consult Note (Signed)
PULMONARY  / CRITICAL CARE MEDICINE  Name: Vincent [name] MRN: [id number] DOB: 24-Oct-1932    ADMISSION DATE:  06/02/2012 CONSULTATION DATE:  06/05/12  REFERRING MD :  [facility]/Dr Short  CHIEF COMPLAINT:  sob  BRIEF PATIENT DESCRIPTION:  50 yowm quit smoking 3 years pta with no pft's on file but clinically severe copd 02 dep 2 lpm 24/7 with baseline room to room sob x sev months gradually worse esp x 6 weeks while recovering from recurrent colon ca and undergoing chemo 1/122.  Assoc with congested rattling cough while on ACEI  but no cp and found to be hypercarbic on arrival > better on bipap worse off it with resting sob so PCCM asked to see pm 2/2  At baseline doe room to room and using combivent and nebs as maint rx, did not feel spiriva helped.  No obvious daytime variabilty or assoc chronic cough or cp or chest tightness, subjective wheeze overt sinus or hb symptoms. No unusual exp hx or h/o childhood pna/ asthma or premature birth to his knowledge.   Sleeping ok without nocturnal  or early am exacerbation  of respiratory  c/o's or need for noct saba. Also denies any obvious fluctuation of symptoms with weather or environmental changes or other aggravating or alleviating factors except as outlined above  ROS  The following are not active complaints unless bolded sore throat, dysphagia, dental problems, itching, sneezing,  nasal congestion or excess/ purulent secretions, ear ache,   fever, chills, sweats, unintended wt loss, pleuritic or exertional cp, hemoptysis,  orthopnea pnd or leg swelling, presyncope, palpitations, heartburn, abdominal pain, anorexia, nausea, vomiting, diarrhea  or change in bowel or urinary habits, change in stools or urine, dysuria,hematuria,  rash, arthralgias, visual complaints, headache, numbness weakness or ataxia or problems with walking or coordination,  change in mood/affect or memory.        PAST MEDICAL HISTORY :  Past Medical History  Diagnosis Date  .  Hypertension   . Arthritis   . Heart disease   . SOB (shortness of breath)   . Malignant neoplasm of hepatic flexure   . Malignant neoplasm of rectosigmoid junction   . Cancer     and/or colon polyp  . Malignant neoplasm of hepatic flexure   . Malignant neoplasm of rectosigmoid junction   . Diabetes mellitus   . Atrial fibrillation   . Colostomy care   . AAA (abdominal aortic aneurysm) without rupture 03/02/2012  . COPD (chronic obstructive pulmonary disease)   . Tobacco abuse   . Diabetic neuropathy   . Acute kidney injury 06/02/2012   Past Surgical History  Procedure Date  . Back surgery 1982-1983    ruptured disk  . Arthroscopic knee 1992  . 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 Exploratory laparotomy 02/2010  . Rectosigmoid colon resection 02/2010  . Colostomy 02/2010  . Drainage of intra-abdominal abscess 02/2010  . Flexible sigmoidoscopy 12/18/2011    Procedure: FLEXIBLE SIGMOIDOSCOPY;  Surgeon: Beverley Fiedler, MD;  Location: [facility];  Service: Gastroenterology;  Laterality: N/A;  . Colon surgery 12/2011  . Cosmetic surgery   . Laparotomy 03/05/2012    Procedure: EXPLORATORY LAPAROTOMY;  Surgeon: Lodema Pilot, DO;  Location: [facility];  Service: General;  Laterality: N/A;  . Laparoscopic lysis of adhesions 03/05/2012    Procedure: LAPAROSCOPIC LYSIS OF ADHESIONS;  Surgeon: Lodema Pilot, DO;  Location: [facility];  Service: General;  Laterality: N/A;  . Back surgery 1970s    ruptured disk  Prior to Admission medications   Medication Sig Start Date End Date Taking? Authorizing Provider  albuterol (PROVENTIL HFA;VENTOLIN HFA) 108 (90 BASE) MCG/ACT inhaler Inhale 2 puffs into the lungs every 6 (six) hours as needed. For shortness of breath.   Yes Historical Provider, MD  albuterol-ipratropium (COMBIVENT) 18-103 MCG/ACT inhaler Inhale 1 puff into the lungs every 4 (four) hours as needed. WHEEZING 12/28/11  Yes Alison Murray, MD  aspirin EC 81 MG tablet Take 81 mg by mouth  daily. 01/16/12 01/15/13 Yes Laveda Norman, MD  carvedilol (COREG) 3.125 MG tablet Take 3.125 mg by mouth 2 (two) times daily with a meal.   Yes Historical Provider, MD  carvedilol (COREG) 6.25 MG tablet Take 6.25 mg by mouth 2 (two) times daily with a meal.   Yes Historical Provider, MD  digoxin (LANOXIN) 0.125 MG tablet Take 0.125 mg by mouth daily.   Yes Historical Provider, MD  Fluticasone-Salmeterol (ADVAIR DISKUS) 500-50 MCG/DOSE AEPB Inhale 1 puff into the lungs every 12 (twelve) hours. 12/28/11  Yes Alison Murray, MD  HYDROmorphone (DILAUDID) 4 MG tablet Take 1 tablet (4 mg total) by mouth every 6 (six) hours as needed. Pain 03/14/12  Yes Christina P Rama, MD  lisinopril (PRINIVIL,ZESTRIL) 20 MG tablet Take 20 mg by mouth daily.   Yes Historical Provider, MD  Melatonin 3 MG CAPS Take 1 capsule by mouth at bedtime.   Yes Historical Provider, MD  metFORMIN (GLUCOPHAGE) 500 MG tablet Take 500 mg by mouth daily with breakfast.  11/12/11  Yes Historical Provider, MD  nitroGLYCERIN (NITROSTAT) 0.3 MG SL tablet Place 0.3 mg under the tongue every 5 (five) minutes as needed. For chest pain.   Yes Historical Provider, MD  oxyCODONE (OXY IR/ROXICODONE) 5 MG immediate release tablet Take 1 tablet (5 mg total) by mouth every 6 (six) hours as needed. Breakthrough pain 03/14/12  Yes Christina P Rama, MD  pantoprazole (PROTONIX) 40 MG tablet Take 40 mg by mouth daily.   Yes Historical Provider, MD  PRESCRIPTION MEDICATION Inject into the vein every 14 (fourteen) days. Adrucil, Leucovorin, and Camptosar infusion every 2 weeks.   Yes Historical Provider, MD  simvastatin (ZOCOR) 40 MG tablet Take 1 tablet (40 mg total) by mouth at bedtime. 12/28/11  Yes Alison Murray, MD  temazepam (RESTORIL) 15 MG capsule Take 15 mg by mouth at bedtime as needed. For sleep.   Yes Historical Provider, MD  warfarin (COUMADIN) 5 MG tablet Take 5 mg by mouth.   Yes Historical Provider, MD  ondansetron (ZOFRAN) 4 MG tablet Take 1 tablet  (4 mg total) by mouth every 6 (six) hours as needed for nausea. 03/14/12   Maryruth Bun Rama, MD   No Known Allergies  FAMILY HISTORY:  Family History  Problem Relation Age of Onset  . Cancer Father   . Diabetes Mother   . Diabetes Son    SOCIAL HISTORY:  reports that he has been smoking Cigarettes.  He has a 30 pack-year smoking history. He has never used smokeless tobacco. He reports that he does not drink alcohol or use illicit drugs.    SUBJECTIVE:  Sob at rest on 02 despite adequate sats  VITAL SIGNS: Temp:  [97.3 F (36.3 C)-97.4 F (36.3 C)] 97.3 F (36.3 C) (02/02 0524) Pulse Rate:  [83] 83  (02/01 2110) Resp:  [16-18] 18  (02/02 0524) BP: (99-106)/(47-51) 106/51 mmHg (02/02 0524) SpO2:  [95 %-100 %] 100 % (02/02 1203) Weight:  [178 lb 9.6  oz (81.012 kg)] 178 lb 9.6 oz (81.012 kg) (02/02 0524)  FIO2  2lpm       INTAKE / OUTPUT: Intake/Output      02/01 0701 - 02/02 0700 02/02 0701 - 02/03 0700   P.O. 480 240   I.V. (mL/kg) 650.7 (8)    Other 240    Total Intake(mL/kg) 1370.7 (16.9) 240 (3)   Urine (mL/kg/hr) 725 (0.4) 680 (1)   Stool 50    Total Output 775 680   Net +595.7 -440          PHYSICAL EXAMINATION: General:  Chronically ill wm with mild increased wob at rest 30 degree hob  .mcod    LABS:  Lab 06/05/12 0500 06/04/12 0515 06/03/12 0930 06/03/12 0540 06/02/12 2350 06/02/12 1744 06/02/12 1523 06/02/12 1440 06/02/12 1247 06/02/12 1150 06/02/12 1131 06/02/12 1120  HGB 9.4* 9.3* 10.0* -- -- -- -- -- -- -- -- --  WBC 6.5 5.3 6.4 -- -- -- -- -- -- -- -- --  PLT 138* 138* 144* -- -- -- -- -- -- -- -- --  NA 134* 134* -- 134* -- -- -- -- -- -- -- --  K 4.5 4.8 -- -- -- -- -- -- -- -- -- --  CL 105 104 -- 104 -- -- -- -- -- -- -- --  CO2 23 23 -- 23 -- -- -- -- -- -- -- --  GLUCOSE 164* 165* -- 144* -- -- -- -- -- -- -- --  BUN 46* 42* -- 40* -- -- -- -- -- -- -- --  CREATININE 1.46* 1.35 -- 1.37* -- -- -- -- -- -- -- --  CALCIUM 9.0 8.7 -- 8.6  -- -- -- -- -- -- -- --  MG 1.9 1.9 -- -- -- -- -- -- -- -- -- --  PHOS -- -- -- -- -- -- -- -- -- -- -- --  AST -- -- -- 18 -- -- -- -- -- -- -- 19  ALT -- -- -- 14 -- -- -- -- -- -- -- 14  ALKPHOS -- -- -- 65 -- -- -- -- -- -- -- 90  BILITOT -- -- -- 0.2* -- -- -- -- -- -- -- 0.2*  PROT -- -- -- 5.6* -- -- -- -- -- -- -- 6.9  ALBUMIN -- -- -- 2.2* -- -- -- -- -- -- -- 2.8*  APTT -- -- -- -- -- -- 69* -- -- -- -- --  INR 1.94* 7.60* 5.77* -- -- -- -- -- -- -- -- --  LATICACIDVEN -- -- -- -- -- -- -- -- -- -- 1.3 --  TROPONINI -- -- -- <0.30 <0.30 <0.30 -- -- -- -- -- --  PROCALCITON -- -- -- -- -- -- -- -- -- -- -- --  PROBNP -- 3452.0* -- -- -- -- -- -- -- -- 2025.0* --  O2SATVEN -- -- -- -- -- -- -- -- -- -- -- --  PHART -- -- -- -- -- -- -- 7.250* 7.214* 7.177* -- --  PCO2ART -- -- -- -- -- -- -- 51.7* 55.8* 61.5* -- --  PO2ART -- -- -- -- -- -- -- 90.4 102.0* 112.0* -- --    Lab 06/05/12 1126 06/05/12 0748 06/04/12 2111 06/04/12 1716 06/04/12 1138  GLUCAP 169* 149* 189* 120* 339*    pCXR 06/05/12 No acute disease   ASSESSMENT / PLAN:   1) Severe copd, sob at rest despite adequate sats assoc with  congested cough and retained secretions  DDX of  difficult airways managment all start with A and  include Adherence, Ace Inhibitors, Acid Reflux, Active Sinus Disease, Alpha 1 Antitripsin deficiency, Anxiety masquerading as Airways dz,  ABPA,  allergy(esp in young), Aspiration (esp in elderly), Adverse effects of DPI,  Active smokers, plus two Bs  = Bronchiectasis and Beta blocker use..and one C= CHF   ? Active smoking > denies at present   ?  ACEi related > off them now but takes a full 6 weeks to appreciate the benefit  ? Beta blocker related > agree with coreg  ? CHF >  Note mod LAE vs Mild RAE is c/w diast dysfunction or mitral valve dz  ? Active or acute sinus dz > sinus ct to be complete  ? Anxiety > try very low dose clonazepam and titrate up if needed   overall  concerned spending too much time in bed and becoming overall deconditioned so desperately needs some pt/ ? Formal rehab   For now add flutter, mucinex dm, agree maintain off acei  DNR appropriate   Sandrea Hughs, MD Pulmonary and Critical Care Medicine [facility] Cell [phone] After 5:30 PM or weekends, call [phone]

## 2012-06-05 NOTE — Progress Notes (Signed)
Patients daughter Selena Batten called to find out if Md has seen patient today.  She can be reached at [phone].

## 2012-06-05 NOTE — Progress Notes (Signed)
Discussed with patient the importance of getting to the chair and increasing activity as a part of the healing process.  Patient states a few girls before thought they were going to make him get to a chair.   Offered to help patient to chair.  He said "maybe" after lunch.

## 2012-06-05 NOTE — Progress Notes (Signed)
ANTICOAGULATION CONSULT NOTE - Follow Up  Pharmacy Consult for:  Coumadin Indication:  Atrial fibrillation  No Known Allergies  Patient Measurements: Height: 5\' 11"  (180.3 cm) Weight: 178 lb 9.6 oz (81.012 kg) IBW/kg (Calculated) : 75.3   Vital Signs: Temp: 97.3 F (36.3 C) (02/02 0524) Temp src: Axillary (02/02 0524) BP: 106/51 mmHg (02/02 0524)  Labs:  Basename 06/05/12 0500 06/04/12 0515 06/03/12 0930 06/03/12 0540 06/02/12 2350 06/02/12 1744 06/02/12 1523  HGB 9.4* 9.3* -- -- -- -- --  HCT 29.2* 29.8* 31.3* -- -- -- --  PLT 138* 138* 144* -- -- -- --  APTT -- -- -- -- -- -- 69*  LABPROT 21.4* 58.9* 48.0* -- -- -- --  INR 1.94* 7.60* 5.77* -- -- -- --  HEPARINUNFRC -- -- -- -- -- -- --  CREATININE 1.46* 1.35 -- 1.37* -- -- --  CKTOTAL -- -- -- -- -- -- --  CKMB -- -- -- -- -- -- --  TROPONINI -- -- -- <0.30 <0.30 <0.30 --    Estimated Creatinine Clearance: 45.1 ml/min (by C-G formula based on Cr of 1.46).  Pertinent Medications:  1/30 >> Aspirin 81mg  PO daily >> 1/30 >> Levaquin 750mg  IV q48h >> 1/31 >> Metronidazole 500mg  po TID >> 2/1 >> Vit K 2.5mg  PO x1  Assessment:  79 year-old male admitted 1/20 with CC: confusion and weakness.   PMH significant for: atrial fibrillation, rectal CA s/p colostomy, last chemo 05/25/12, AAA without rupture (02/2012)  Receives chronic anticoagulation at home with Coumadin.  The usual home dose was previously documented as 5 mg daily except for 2.5 mg on Mondays and Fridays.  The current instructions could not be verified with patient or family.  Admission INR was supratherapeutic.  INR elevation could have been exasperated by drug interaction with Levaquin, change in dietary habits, and/or diarrhea.  INR today is now slightly subtherapeutic (1.94) after receiving 2.5mg  PO Vit K on 2/1. No warfarin has been given in hospital this admission. Will resume warfarin dosing tonight.  No bleeding reported in chart notes  Goals of  Therapy:   INR 2-3  Plan:   Warfarin 2.5mg  PO x1 at 18:00 tonight  F/U daily PT/INR  Darrol Angel, PharmD Pager: [phone] 06/05/2012 9:16 AM

## 2012-06-05 NOTE — Progress Notes (Signed)
06-05-12 NSG:Pt had 16 beats of V-tach at 7:03 then returned to afib  In the 70's.  I was alerted at 0730 and pt asymptomatic, vss, this is not his first SVT episode per PN on 2-1.  Dr. [name] notified by [facility].  New orders received.  Will continue to monitor.  Strip placed in chart

## 2012-06-05 NOTE — Progress Notes (Signed)
Patient states he feels the same as yesterday, increased SOB with movement.  Offered to get to chair, patient refused.  States it take all the energy out of him to stand.

## 2012-06-05 NOTE — Progress Notes (Signed)
Patient moved from bed to chair with stand by assist.  Tolerated well.  Stayed in chair for dinner.

## 2012-06-06 ENCOUNTER — Inpatient Hospital Stay (HOSPITAL_COMMUNITY): Payer: Medicare Other

## 2012-06-06 DIAGNOSIS — Z9981 Dependence on supplemental oxygen: Secondary | ICD-10-CM

## 2012-06-06 DIAGNOSIS — R109 Unspecified abdominal pain: Secondary | ICD-10-CM

## 2012-06-06 DIAGNOSIS — J9610 Chronic respiratory failure, unspecified whether with hypoxia or hypercapnia: Secondary | ICD-10-CM

## 2012-06-06 LAB — CBC
HCT: 28.7 % — ABNORMAL LOW (ref 39.0–52.0)
Hemoglobin: 9.1 g/dL — ABNORMAL LOW (ref 13.0–17.0)
MCH: 30.3 pg (ref 26.0–34.0)
MCHC: 31.7 g/dL (ref 30.0–36.0)
MCV: 95.7 fL (ref 78.0–100.0)
Platelets: 123 10*3/uL — ABNORMAL LOW (ref 150–400)
RBC: 3 MIL/uL — ABNORMAL LOW (ref 4.22–5.81)
RDW: 16.8 % — ABNORMAL HIGH (ref 11.5–15.5)
WBC: 4.3 10*3/uL (ref 4.0–10.5)

## 2012-06-06 LAB — STOOL CULTURE: Special Requests: NORMAL

## 2012-06-06 LAB — BASIC METABOLIC PANEL
BUN: 43 mg/dL — ABNORMAL HIGH (ref 6–23)
GFR calc Af Amer: 63 mL/min — ABNORMAL LOW (ref >90)
GFR calc non Af Amer: 54 mL/min — ABNORMAL LOW (ref >90)
Potassium: 4.5 mEq/L (ref 3.5–5.1)

## 2012-06-06 LAB — GLUCOSE, CAPILLARY
Glucose-Capillary: 229 mg/dL — ABNORMAL HIGH (ref 70–99)
Glucose-Capillary: 195 mg/dL — ABNORMAL HIGH (ref 70–99)
Glucose-Capillary: 195 mg/dL — ABNORMAL HIGH (ref 70–99)

## 2012-06-06 LAB — PROTIME-INR
INR: 1.43 (ref 0.00–1.49)
Prothrombin Time: 17.1 seconds — ABNORMAL HIGH (ref 11.6–15.2)

## 2012-06-06 MED ORDER — METHYLPREDNISOLONE SODIUM SUCC 125 MG IJ SOLR
80.0000 mg | Freq: Every day | INTRAMUSCULAR | Status: DC
  Administered 2012-06-07: 80 mg via INTRAVENOUS
  Filled 2012-06-06: qty 1.28

## 2012-06-06 MED ORDER — ARFORMOTEROL TARTRATE 15 MCG/2ML IN NEBU
15.0000 ug | INHALATION_SOLUTION | Freq: Two times a day (BID) | RESPIRATORY_TRACT | Status: DC
  Administered 2012-06-06 – 2012-06-09 (×6): 15 ug via RESPIRATORY_TRACT
  Filled 2012-06-06 (×8): qty 2

## 2012-06-06 MED ORDER — MORPHINE SULFATE (CONCENTRATE) 10 MG /0.5 ML PO SOLN
10.0000 mg | ORAL | Status: DC | PRN
  Administered 2012-06-07: 20 mg via ORAL
  Filled 2012-06-06: qty 1

## 2012-06-06 MED ORDER — IPRATROPIUM BROMIDE 0.02 % IN SOLN: 0.5000 mg | Freq: Four times a day (QID) | RESPIRATORY_TRACT | Status: DC | PRN

## 2012-06-06 MED ORDER — WARFARIN SODIUM 2.5 MG PO TABS
2.5000 mg | ORAL_TABLET | Freq: Once | ORAL | Status: AC
  Administered 2012-06-06: 2.5 mg via ORAL
  Filled 2012-06-06: qty 1

## 2012-06-06 MED ORDER — ALBUTEROL SULFATE (5 MG/ML) 0.5% IN NEBU: 2.5000 mg | INHALATION_SOLUTION | Freq: Four times a day (QID) | RESPIRATORY_TRACT | Status: DC | PRN

## 2012-06-06 MED ORDER — CLONAZEPAM 0.5 MG PO TABS: 0.2500 mg | ORAL_TABLET | Freq: Two times a day (BID) | ORAL | Status: DC

## 2012-06-06 MED ORDER — MORPHINE SULFATE (CONCENTRATE) 10 MG /0.5 ML PO SOLN: 10.0000 mg | ORAL | Status: DC | PRN

## 2012-06-06 MED ORDER — PANTOPRAZOLE SODIUM 40 MG PO TBEC
40.0000 mg | DELAYED_RELEASE_TABLET | Freq: Two times a day (BID) | ORAL | Status: DC
  Administered 2012-06-06 – 2012-06-09 (×6): 40 mg via ORAL
  Filled 2012-06-06 (×7): qty 1

## 2012-06-06 NOTE — Progress Notes (Signed)
Occupational Therapy Treatment Patient Details Name: Vincent [name] MRN: [id number] DOB: 1932/12/08 Today's Date: 06/06/2012 Time: 7829-5621 OT Time Calculation (min): 24 min  OT Assessment / Plan / Recommendation Comments on Treatment Session fatigued quickly    Follow Up Recommendations  Home health OT             Frequency Min 2X/week   Plan Discharge plan remains appropriate    Precautions / Restrictions Precautions Precautions: Fall Precaution Comments: Pt does become SOB quickly and did agree to get in chair.       ADL  Grooming: Performed;Wash/dry face;Wash/dry hands;Brushing hair;Minimal assistance;Other (comment) (pt did need 3 rest breaks during this sitting activity) Where Assessed - Grooming: Unsupported sitting Toilet Transfer: Performed;Moderate assistance Toilet Transfer Method: Sit to stand;Other (comment) (with urinal) Toileting - Clothing Manipulation and Hygiene: Moderate assistance;Other (comment) (pt did lose balance, but did recover with assist) Transfers/Ambulation Related to ADLs: Pt ask how long he had to stay in chair once he was sitting in chair      OT Goals ADL Goals ADL Goal: Grooming - Progress: Progressing toward goals ADL Goal: Toilet Transfer - Progress: Progressing toward goals ADL Goal: Toileting - Clothing Manipulation - Progress: Progressing toward goals  Visit Information  Last OT Received On: 06/06/12          Cognition  Cognition Overall Cognitive Status: Appears within functional limits for tasks assessed/performed Arousal/Alertness: Awake/alert Orientation Level: Appears intact for tasks assessed Behavior During Session: [facility] for tasks performed    Mobility  Bed Mobility Bed Mobility: Supine to Sit Supine to Sit: 5: Supervision;HOB elevated Details for Bed Mobility Assistance: Pt had no difficulty mobilizing self to EOB. Transfers Transfers: Sit to Stand;Stand to Sit Sit to Stand: With upper extremity assist;From  bed;3: Mod assist Stand to Sit: With upper extremity assist;With armrests;To chair/3-in-1;3: Mod assist          End of Session OT - End of Session Activity Tolerance: Patient limited by fatigue Patient left: in chair;with call bell/phone within reach Nurse Communication: Mobility status       Shamia Uppal, [place] 06/06/2012, 11:46 AM

## 2012-06-06 NOTE — Progress Notes (Addendum)
PULMONARY  / CRITICAL CARE MEDICINE  Name: Vincent [name] MRN: [id number] DOB: 04-12-1933    ADMISSION DATE:  06/02/2012 CONSULTATION DATE:  06/05/12  REFERRING MD :  [facility]/Dr Short  CHIEF COMPLAINT:  sob  BRIEF PATIENT DESCRIPTION:  43 yowm quit smoking 3 years pta with no pft's on file but clinically severe copd 02 dep 2 lpm 24/7 with baseline room to room sob x sev months gradually worse esp x 6 weeks while recovering from recurrent colon ca and undergoing chemo 1/122.  Assoc with congested rattling cough while on ACEI  but no cp and found to be hypercarbic on arrival > better on bipap worse off it with resting sob so PCCM asked to see pm 2/2  At baseline doe room to room and using combivent and nebs as maint rx, did not feel spiriva helped.        SUBJECTIVE:  Some better, congested cough  VITAL SIGNS: Temp:  [97.6 F (36.4 C)-97.7 F (36.5 C)] 97.6 F (36.4 C) (02/03 0556) Pulse Rate:  [73-90] 73  (02/03 0556) Resp:  [18] 18  (02/03 0556) BP: (119-128)/(68-69) 119/69 mmHg (02/03 0556) SpO2:  [91 %-100 %] 95 % (02/03 0758) Weight:  [83.9 kg (184 lb 15.5 oz)] 83.9 kg (184 lb 15.5 oz) (02/03 0500)  FIO2  2lpm       INTAKE / OUTPUT: Intake/Output      02/02 0701 - 02/03 0700 02/03 0701 - 02/04 0700   P.O. 1200 240   I.V. (mL/kg) 1106.3 (13.2)    Other     Total Intake(mL/kg) 2306.3 (27.5) 240 (2.9)   Urine (mL/kg/hr) 2105 (1) 150 (0.3)   Stool 300    Total Output 2405 150   Net -98.7 +90          PHYSICAL EXAMINATION: General:  Chronically ill wm . Does not feel any better HEENT mild turbinate edema.  Oropharynx no thrush or excess pnd or cobblestoning.  No JVD or cervical adenopathy. Mild accessory muscle hypertrophy. Trachea midline, nl thryroid. Chest was hyperinflated by percussion with diminished breath sounds and moderate increased exp time without wheeze. Hoover sign positive at mid inspiration. Regular rate and rhythm without murmur gallop or rub or increase  P2 or edema.  Abd: no hsm, nl excursion. Ext warm without cyanosis or clubbing.     LABS:  Lab 06/06/12 0640 06/05/12 0500 06/04/12 0515 06/03/12 0540 06/02/12 2350 06/02/12 1744 06/02/12 1523 06/02/12 1440 06/02/12 1247 06/02/12 1150 06/02/12 1131 06/02/12 1120  HGB 9.1* 9.4* 9.3* -- -- -- -- -- -- -- -- --  WBC 4.3 6.5 5.3 -- -- -- -- -- -- -- -- --  PLT 123* 138* 138* -- -- -- -- -- -- -- -- --  NA 136 134* 134* -- -- -- -- -- -- -- -- --  K 4.5 4.5 -- -- -- -- -- -- -- -- -- --  CL 104 105 104 -- -- -- -- -- -- -- -- --  CO2 24 23 23  -- -- -- -- -- -- -- -- --  GLUCOSE 269* 164* 165* -- -- -- -- -- -- -- -- --  BUN 43* 46* 42* -- -- -- -- -- -- -- -- --  CREATININE 1.24 1.46* 1.35 -- -- -- -- -- -- -- -- --  CALCIUM 8.7 9.0 8.7 -- -- -- -- -- -- -- -- --  MG -- 1.9 1.9 -- -- -- -- -- -- -- -- --  PHOS -- -- -- -- -- -- -- -- -- -- -- --  AST -- -- -- 18 -- -- -- -- -- -- -- 19  ALT -- -- -- 14 -- -- -- -- -- -- -- 14  ALKPHOS -- -- -- 65 -- -- -- -- -- -- -- 90  BILITOT -- -- -- 0.2* -- -- -- -- -- -- -- 0.2*  PROT -- -- -- 5.6* -- -- -- -- -- -- -- 6.9  ALBUMIN -- -- -- 2.2* -- -- -- -- -- -- -- 2.8*  APTT -- -- -- -- -- -- 69* -- -- -- -- --  INR 1.43 1.94* 7.60* -- -- -- -- -- -- -- -- --  LATICACIDVEN -- -- -- -- -- -- -- -- -- -- 1.3 --  TROPONINI -- -- -- <0.30 <0.30 <0.30 -- -- -- -- -- --  PROCALCITON -- -- -- -- -- -- -- -- -- -- -- --  PROBNP -- -- 3452.0* -- -- -- -- -- -- -- 2025.0* --  O2SATVEN -- -- -- -- -- -- -- -- -- -- -- --  PHART -- -- -- -- -- -- -- 7.250* 7.214* 7.177* -- --  PCO2ART -- -- -- -- -- -- -- 51.7* 55.8* 61.5* -- --  PO2ART -- -- -- -- -- -- -- 90.4 102.0* 112.0* -- --    Lab 06/06/12 1149 06/06/12 0800 06/05/12 2101 06/05/12 1704 06/05/12 1126  GLUCAP 195* 229* 175* 169* 169*    pCXR 06/05/12 No acute disease   ASSESSMENT / PLAN:   1) Severe copd, sob at rest despite adequate sats assoc with congested cough and retained  secretions  DDX of  difficult airways managment all start with A and  include Adherence, Ace Inhibitors, Acid Reflux, Active Sinus Disease, Alpha 1 Antitripsin deficiency, Anxiety masquerading as Airways dz,  ABPA,  allergy(esp in young), Aspiration (esp in elderly), Adverse effects of DPI,  Active smokers, plus two Bs  = Bronchiectasis and Beta blocker use..and one C= CHF   ? Active smoking > denies at present   ?  ACEi related > off them now but takes a full 6 weeks to appreciate the benefit  ? Beta blocker related > agree with bisoprolol rather than coreg  ? CHF >  Note mod LAE vs Mild RAE is c/w diast dysfunction or mitral valve dz  ? Active or acute sinus dz > sinus ct to be complete  ? Anxiety > try very low dose clonazepam and titrate up if needed   overall concerned spending too much time in bed and becoming overall deconditioned so desperately needs some pt/ ? Formal rehab    added flutter, mucinex dm 2/2 Changed to laba 2/3   DNR appropriate   Sandrea Hughs, MD Pulmonary and Critical Care Medicine [facility] Cell [phone] After 5:30 PM or weekends, call [phone]

## 2012-06-06 NOTE — Progress Notes (Signed)
Pt placed on bipap with 2L O2 at this time. Pt in auto titrate mode with max ipap-20 cmH2O, min epap-5 cmH2O. Pt tolerating well at this time.  Jacqulynn Cadet RRT

## 2012-06-06 NOTE — Progress Notes (Signed)
  ANTICOAGULATION CONSULT NOTE - Follow Up Consult  Pharmacy Consult for Coumadin Indication: atrial fibrillation  No Known Allergies  Patient Measurements: Height: 5\' 11"  (180.3 cm) Weight: 184 lb 15.5 oz (83.9 kg) IBW/kg (Calculated) : 75.3  Heparin Dosing Weight:   Vital Signs: Temp: 97.7 F (36.5 C) (02/03 1501) Temp src: Oral (02/03 1501) BP: 129/63 mmHg (02/03 1501) Pulse Rate: 70  (02/03 1501)  Labs:  Basename 06/06/12 0640 06/05/12 0500 06/04/12 0515  HGB 9.1* 9.4* --  HCT 28.7* 29.2* 29.8*  PLT 123* 138* 138*  APTT -- -- --  LABPROT 17.1* 21.4* 58.9*  INR 1.43 1.94* 7.60*  HEPARINUNFRC -- -- --  CREATININE 1.24 1.46* 1.35  CKTOTAL -- -- --  CKMB -- -- --  TROPONINI -- -- --    Estimated Creatinine Clearance: 53.1 ml/min (by C-G formula based on Cr of 1.24).   Medications:  Home dose: 5 mg daily except 2.5 mg on Mon & Fri per previous med list, but the current instructions could not be verified.  Assessment: 79 year-old male on chronic coumadin for PAF admitted 1/20 with CC: confusion and weakness.  PMH significant for: atrial fibrillation, rectal CA s/p colostomy, last chemo 05/25/12, AAA without rupture (02/2012) Admission INR was supratherapeutic on admission therefore coumadin held and pt given Vit K.  Coumadin resumed last night and INR remains subtherapeutic. No bleeding reported in chart notes or per RN Plts declining: 263 on admit, now 123. Hgb low, 9.1- must watch carefully.  On Levaquin for acute resp failure/COPD and flagyl D#4/14 (through 2/14) for CDiff which can both elevate INR Will dose conservatively d/t DDIs and thrombocytopenia   Goal of Therapy:  INR 2-3 Monitor platelets by anticoagulation protocol: Yes   Plan:  1.  Repeat coumadin 2.5mg  po x 1 tonight.  2.  F/u daily PT/INR, CBC  Haynes Hoehn, PharmD 06/06/2012 3:16 PM  Pager: [phone]

## 2012-06-06 NOTE — Progress Notes (Addendum)
[facility] PROGRESS NOTE  QUY LOTTS JYN:[id number] DOB: [birth year] DOA: 06/02/2012 PCP: Aura Dials, MD  Assessment/Plan  Acute on chronic respiratory failure with hypercapnea: Flu and troponins neg.  CXR appears clear, but very hyperinflated with flattened diaphragms suggestive of COPD.  Shortness of breath and full expiratory wheeze improving slowly.   - Continue nasal canula at home dose 2L with home bipap overnight  - Wean Solumedrol - Continue budesonide - Duonebs q4h  - Albuterol prn  - Levofloxacin day 5/5 -  Continue nasal saline, flonase, afrin -  Appreciate Pulmonary recommendations -  Add morphine as needed for dyspnea  C. Diff Diarrhea, mild, with more formed stools -  Flagyl, day 4 -  stool culture neg  Slurred speech:  May be side effect of medication (patient has dry mouth) and patient is on frequent atrovent and was started on clonazepam this morning.  Family is VERY concerned about stroke.  Prior to admission, patient had therapeutic to supratherapeutic INR, but currently mildly subtherapeutic.  He has had intermittent slurring for > 5 days.  No focal deficits on exam except for difficulty articulating, which is improved with a sip of water.   -  D/C clonazepam -  Patient agreeable to attempting an MRI in the morning, however, I am unsure that given his degree of dyspnea, he would be able to lie flat.   -  Alternatively, could screen with CT scan -  Introduced idea of transitioning to oral anticoagulant regardless of whether we get an MRI or not.   Acute kidney injury, likely prerenal and some ATN from dehydration and hypotesion, resolving.   -  D/c labs as patient states he would just like to be comfortable.    Weakness generalized:  PT/OT recommending home health PT/OT  Thrombocytopenia, trending down slightly but generally stable.  No heparin administered.   -  Occult stool positive, but no gross blood -  Haptoglobin/LDH wnl -  Smear review  without schistocytes -  Fibrinogen elevated -  D/c labs as patient states he would just like to be comfortable.    PAF (paroxysmal atrial fibrillation) with RVR in ER that responded to IVF. supratherapeutic INR is likely due to diarrhea.  HR in 80s this morning.  Has had a couple episodes of nonsustained V-tach, up to 16 beats.   - d/c telemetry  -  Continue bisoprolol -  Continue digoxin  -  Electrolytes have been stable  DM (diabetes mellitus) with elevated FS and on steroids, elevated FS today, but reducing steroids - Continue high dose SSI  HTN (hypertension), blood pressure low normal to hypotensive, but stable  Chronic diastolic CHF (congestive heart failure), grade 1:  - Monitor carefully for evidence of hypervolemia, currently appears dry -  Strict I/O  Rectal cancer:  Patient was discussion palliative care today with me.  -  Will touch base with Dr. Clelia Croft on Tuesday.    Diet:  Diabetic diet Access: PIV  IVF:  OFF Proph:  warfarin   Code Status: DNR/DNI  Family Communication:  Spoke with patient alone today.  Family:  daughter Selena Batten.  Sister Geraldine Contras is his power of attorney because wife has Alzheimer's: [phone].   Disposition Plan:  D/c telemetry, continue to monitor for symptoms of dyspnea.  Palliative care consultation.    Consultants:  none  Procedures:  none  Antibiotics:  Levofloxacin 1/30 >> 2/3  Flagyl 1/31 >>   HPI/Subjective:  Patient states that he feels still very Isacc Turney of breath.  Denies cough.  Denies N/V.  Has had more formed stool in his ostomy, but less volume.  Denies chest pain.  Feels like he is going to pass out with minimal exertion.  He states he is tired and does not want to keep going like this.    Objective: Filed Vitals:   06/06/12 0500 06/06/12 0556 06/06/12 0758 06/06/12 1501  BP:  119/69  129/63  Pulse:  73  70  Temp:  97.6 F (36.4 C)  97.7 F (36.5 C)  TempSrc:  Oral  Oral  Resp:  18  18  Height:      Weight: 83.9 kg  (184 lb 15.5 oz)     SpO2:  98% 95% 100%    Intake/Output Summary (Last 24 hours) at 06/06/12 1651 Last data filed at 06/06/12 1500  Gross per 24 hour  Intake 2306.33 ml  Output   2075 ml  Net 231.33 ml   Filed Weights   06/04/12 0400 06/05/12 0524 06/06/12 0500  Weight: 83.1 kg (183 lb 3.2 oz) 81.012 kg (178 lb 9.6 oz) 83.9 kg (184 lb 15.5 oz)    Exam:   General:  Thin CM, mild respiratory distress with SCM and subcostal retractions and pursed lip breathing  HEENT:  MMM  Cardiovascular:  IRRR, distant heart sounds, no obvious murmur, 2+ pulses  Respiratory:  Diminished bilateral breath sounds with scattered course rales, very high pitched wheeze throughout and prolonged expiratory phase.  Forced expiratory phase.    Abdomen:  NABS, soft, ND/NT, ostomy with liquid stool.    MSK:  Normal tone and bulk, trace lower extremity edema  Skin:  Normal skin turgor   Neuro:  Alert, quick to answer questions.  No focal deficits on strength exam.  No dysmetria.  No confusion.  Sensation intact.  Mild slurred speech that improves with a sip of water.    Data Reviewed: Basic Metabolic Panel:  Lab 06/06/12 9604 06/05/12 0500 06/04/12 0515 06/03/12 0540 06/02/12 1744  NA 136 134* 134* 134* 133*  K 4.5 4.5 4.8 4.7 4.8  CL 104 105 104 104 101  CO2 24 23 23 23 22   GLUCOSE 269* 164* 165* 144* 201*  BUN 43* 46* 42* 40* 35*  CREATININE 1.24 1.46* 1.35 1.37* 1.51*  CALCIUM 8.7 9.0 8.7 8.6 8.8  MG -- 1.9 1.9 -- --  PHOS -- -- -- -- --   Liver Function Tests:  Lab 06/03/12 0540 06/02/12 1120  AST 18 19  ALT 14 14  ALKPHOS 65 90  BILITOT 0.2* 0.2*  PROT 5.6* 6.9  ALBUMIN 2.2* 2.8*   No results found for this basename: LIPASE:5,AMYLASE:5 in the last 168 hours No results found for this basename: AMMONIA:5 in the last 168 hours CBC:  Lab 06/06/12 0640 06/05/12 0500 06/04/12 0515 06/03/12 0930 06/03/12 0540 06/02/12 1120  WBC 4.3 6.5 5.3 6.4 4.8 --  NEUTROABS -- -- -- -- -- 8.0*   HGB 9.1* 9.4* 9.3* 10.0* 9.8* --  HCT 28.7* 29.2* 29.8* 31.3* 30.8* --  MCV 95.7 95.7 95.8 95.4 95.7 --  PLT 123* 138* 138* 144* 137* --   Cardiac Enzymes:  Lab 06/03/12 0540 06/02/12 2350 06/02/12 1744 06/02/12 1131  CKTOTAL -- -- -- --  CKMB -- -- -- --  CKMBINDEX -- -- -- --  TROPONINI <0.30 <0.30 <0.30 <0.30   BNP (last 3 results)  Basename 06/04/12 0515 06/02/12 1131 03/13/12 0515  PROBNP 3452.0* 2025.0* 2539.0*   CBG:  Lab 06/06/12 1149  06/06/12 0800 06/05/12 2101 06/05/12 1704 06/05/12 1126  GLUCAP 195* 229* 175* 169* 169*    Recent Results (from the past 240 hour(s))  URINE CULTURE     Status: Normal   Collection Time   06/02/12 11:40 AM      Component Value Range Status Comment   Specimen Description URINE, CATHETERIZED   Final    Special Requests NONE   Final    Culture  Setup Time 06/02/2012 16:04   Final    Colony Count NO GROWTH   Final    Culture NO GROWTH   Final    Report Status 06/03/2012 FINAL   Final   MRSA PCR SCREENING     Status: Normal   Collection Time   06/02/12  4:12 PM      Component Value Range Status Comment   MRSA by PCR NEGATIVE  NEGATIVE Final   STOOL CULTURE     Status: Normal   Collection Time   06/03/12  9:30 AM      Component Value Range Status Comment   Specimen Description STOOL   Final    Special Requests Normal   Final    Culture     Final    Value: NO SALMONELLA, SHIGELLA, CAMPYLOBACTER, YERSINIA, OR E.COLI 0157:H7 ISOLATED   Report Status 06/06/2012 FINAL   Final   CLOSTRIDIUM DIFFICILE BY PCR     Status: Abnormal   Collection Time   06/03/12  9:30 AM      Component Value Range Status Comment   C difficile by pcr POSITIVE (*) NEGATIVE Final      Studies: Dg Chest Port 1 View  06/05/2012  *RADIOLOGY REPORT*  Clinical Data: Shortness of breath, weakness.  PORTABLE CHEST - 1 VIEW  Comparison: 06/02/2012  Findings: Left subclavian port catheter stable, tip mid SVC. Lungs clear.  Heart size and pulmonary vascularity normal.   No effusion. Visualized bones unremarkable.  IMPRESSION: No acute disease   Original Report Authenticated By: D. Andria Rhein, MD     Scheduled Meds:    . ipratropium  0.5 mg Nebulization Q4H   And  . albuterol  2.5 mg Nebulization Q4H  . antiseptic oral rinse  15 mL Mouth Rinse q12n4p  . aspirin EC  81 mg Oral Daily  . bisoprolol  5 mg Oral Daily  . budesonide (PULMICORT) nebulizer solution  0.5 mg Nebulization BID  . chlorhexidine  15 mL Mouth Rinse BID  . clonazePAM  0.5 mg Oral BID  . dextromethorphan-guaiFENesin  2 tablet Oral BID  . digoxin  0.125 mg Oral Daily  . fluticasone  2 spray Each Nare Daily  . insulin aspart  0-20 Units Subcutaneous TID WC  . insulin aspart  0-5 Units Subcutaneous QHS  . levofloxacin (LEVAQUIN) IV  750 mg Intravenous Q48H  . methylPREDNISolone (SOLU-MEDROL) injection  80 mg Intravenous Q12H  . metroNIDAZOLE  500 mg Oral Q8H  . oxymetazoline  1 spray Each Nare BID  . pantoprazole  40 mg Oral BID AC  . simvastatin  40 mg Oral QHS  . sodium chloride  1,000 mL Intravenous Once  . sodium chloride  3 mL Intravenous Q12H  . warfarin  2.5 mg Oral ONCE-1800  . Warfarin - Pharmacist Dosing Inpatient   Does not apply q1800   Continuous Infusions:    . sodium chloride 20 mL/hr (06/06/12 0836)    Principal Problem:  *Acute respiratory failure with hypercapnia Active Problems:  Abdominal pain  Thrombocytopenia  Hyponatremia  Weakness generalized  Anemia  PAF (paroxysmal atrial fibrillation)  DM (diabetes mellitus)  HTN (hypertension)  Chronic diastolic CHF (congestive heart failure)  C. difficile diarrhea  Dehydration  Acute kidney injury  Anticoagulation excessive  Tobacco abuse  COPD (chronic obstructive pulmonary disease)    Time spent: 30 min    Chibuikem Thang, [facility]  [facility] Pager [phone]. If 7PM-7AM, please contact night-coverage at [email or web address], password [facility] 06/06/2012, 4:51 PM  LOS: 4 days

## 2012-06-06 NOTE — Progress Notes (Signed)
Physical Therapy Treatment Patient Details Name: Vincent [name] MRN: [id number] DOB: [birth year] Today's Date: 06/06/2012 Time: 8413-2440 PT Time Calculation (min): 17 min  PT Assessment / Plan / Recommendation Comments on Treatment Session  Pt OOB in recliner c/o back pain, declining amb and requested to go back to bed.     Follow Up Recommendations  Home health PT;Supervision/Assistance - 24 hour     Does the patient have the potential to tolerate intense rehabilitation     Barriers to Discharge        Equipment Recommendations  None recommended by PT    Recommendations for Other Services    Frequency Min 3X/week   Plan Discharge plan remains appropriate    Precautions / Restrictions Precautions Precautions: Fall Precaution Comments: home O2 Restrictions Weight Bearing Restrictions: No   Pertinent Vitals/Pain C/o back pain    Mobility  Bed Mobility Bed Mobility: Sit to Supine Supine to Sit: 5: Supervision;HOB elevated Sit to Supine: 4: Min guard Details for Bed Mobility Assistance: Min Guard assist to support B LE up onto bed and pull away balnkets Transfers Transfers: Sit to Stand;Stand to Sit Sit to Stand: 4: Min assist;From chair/3-in-1 Stand to Sit: 4: Min assist;To bed Details for Transfer Assistance: required increased time Ambulation/Gait Ambulation/Gait Assistance Details: Pt declined amb due to max fatigue sitting in recliner a few hours    Exercises      PT Goals    Visit Information  Last PT Received On: 06/06/12 Assistance Needed: +2 (for amb safety/equipment)    Subjective Data  Subjective: I am ready to lay back down, my back hurts Patient Stated Goal: home   Cognition  Cognition Overall Cognitive Status: Appears within functional limits for tasks assessed/performed Arousal/Alertness: Awake/alert Orientation Level: Appears intact for tasks assessed Behavior During Session: [facility] for tasks performed    Balance     End of Session PT -  End of Session Equipment Utilized During Treatment: Gait belt Activity Tolerance: Patient limited by fatigue Patient left: in bed;with call bell/phone within reach   Felecia Shelling  PTA [facility]  Acute  Rehab Pager      [phone]

## 2012-06-07 ENCOUNTER — Inpatient Hospital Stay (HOSPITAL_COMMUNITY): Payer: Medicare Other

## 2012-06-07 DIAGNOSIS — J9620 Acute and chronic respiratory failure, unspecified whether with hypoxia or hypercapnia: Secondary | ICD-10-CM

## 2012-06-07 DIAGNOSIS — J329 Chronic sinusitis, unspecified: Secondary | ICD-10-CM | POA: Diagnosis present

## 2012-06-07 LAB — GLUCOSE, CAPILLARY
Glucose-Capillary: 216 mg/dL — ABNORMAL HIGH (ref 70–99)
Glucose-Capillary: 143 mg/dL — ABNORMAL HIGH (ref 70–99)

## 2012-06-07 LAB — PROTIME-INR: INR: 2.11 — ABNORMAL HIGH (ref 0.00–1.49)

## 2012-06-07 MED ORDER — SODIUM CHLORIDE 0.9 % IJ SOLN
10.0000 mL | INTRAMUSCULAR | Status: DC | PRN
  Administered 2012-06-07 – 2012-06-09 (×2): 10 mL

## 2012-06-07 MED ORDER — LEVOFLOXACIN 750 MG PO TABS
750.0000 mg | ORAL_TABLET | Freq: Every day | ORAL | Status: DC
  Administered 2012-06-07 – 2012-06-08 (×2): 750 mg via ORAL
  Filled 2012-06-07 (×3): qty 1

## 2012-06-07 MED ORDER — PREDNISONE 50 MG PO TABS
60.0000 mg | ORAL_TABLET | Freq: Every day | ORAL | Status: DC
  Administered 2012-06-08 – 2012-06-09 (×2): 60 mg via ORAL
  Filled 2012-06-07 (×4): qty 1

## 2012-06-07 MED ORDER — VANCOMYCIN 50 MG/ML ORAL SOLUTION
125.0000 mg | Freq: Four times a day (QID) | ORAL | Status: DC
  Administered 2012-06-07 – 2012-06-09 (×9): 125 mg via ORAL
  Filled 2012-06-07 (×12): qty 2.5

## 2012-06-07 NOTE — Progress Notes (Signed)
PULMONARY  / CRITICAL CARE MEDICINE  Name: Vincent [name] MRN: [id number] DOB: 26-Mar-1933    ADMISSION DATE:  06/02/2012 CONSULTATION DATE:  06/05/12  REFERRING MD :  [facility]/Dr Short  CHIEF COMPLAINT:  sob  BRIEF PATIENT DESCRIPTION:  10 yowm quit smoking 3 years pta with no pft's on file but clinically severe copd 02 dep 2 lpm 24/7 with baseline room to room sob x sev months gradually worse esp x 6 weeks while recovering from recurrent colon ca and undergoing chemo 1/122.  Assoc with congested rattling cough while on ACEI  but no cp and found to be hypercarbic on arrival > better on bipap worse off it with resting sob so PCCM asked to see pm 2/2  At baseline doe room to room and using combivent and nebs as maint rx, did not feel spiriva helped.        SUBJECTIVE:  NSC  VITAL SIGNS: Temp:  [97.3 F (36.3 C)-97.7 F (36.5 C)] 97.5 F (36.4 C) (02/04 0620) Pulse Rate:  [60-70] 70  (02/04 0620) Resp:  [18-20] 20  (02/04 0620) BP: (116-129)/(58-63) 118/61 mmHg (02/04 0620) SpO2:  [95 %-100 %] 95 % (02/04 0902) Weight:  [83.961 kg (185 lb 1.6 oz)] 83.961 kg (185 lb 1.6 oz) (02/04 0620)  FIO2  1lpm       INTAKE / OUTPUT: Intake/Output      02/03 0701 - 02/04 0700 02/04 0701 - 02/05 0700   P.O. 720    I.V. (mL/kg) 470 (5.6) 150 (1.8)   IV Piggyback 300    Total Intake(mL/kg) 1490 (17.7) 150 (1.8)   Urine (mL/kg/hr) 1075 (0.5)    Stool 525    Total Output 1600    Net -110 +150          PHYSICAL EXAMINATION: General:  Chronically ill wm . Does not feel any better, but looks better HEENT No LAN . Chest : Decreased bs Cards: . Regular rate and rhythm without murmur gallop or rub or increase P2 or edema.  Abd: no hsm, nl excursion.  Ext warm without cyanosis or clubbing.     LABS:  Lab 06/07/12 0442 06/06/12 0640 06/05/12 0500 06/04/12 0515 06/03/12 0540 06/02/12 2350 06/02/12 1744 06/02/12 1523 06/02/12 1440 06/02/12 1247 06/02/12 1150 06/02/12 1131 06/02/12 1120  HGB  -- 9.1* 9.4* 9.3* -- -- -- -- -- -- -- -- --  WBC -- 4.3 6.5 5.3 -- -- -- -- -- -- -- -- --  PLT -- 123* 138* 138* -- -- -- -- -- -- -- -- --  NA -- 136 134* 134* -- -- -- -- -- -- -- -- --  K -- 4.5 4.5 -- -- -- -- -- -- -- -- -- --  CL -- 104 105 104 -- -- -- -- -- -- -- -- --  CO2 -- 24 23 23  -- -- -- -- -- -- -- -- --  GLUCOSE -- 269* 164* 165* -- -- -- -- -- -- -- -- --  BUN -- 43* 46* 42* -- -- -- -- -- -- -- -- --  CREATININE -- 1.24 1.46* 1.35 -- -- -- -- -- -- -- -- --  CALCIUM -- 8.7 9.0 8.7 -- -- -- -- -- -- -- -- --  MG -- -- 1.9 1.9 -- -- -- -- -- -- -- -- --  PHOS -- -- -- -- -- -- -- -- -- -- -- -- --  AST -- -- -- -- 18 -- -- -- -- -- -- --  19  ALT -- -- -- -- 14 -- -- -- -- -- -- -- 14  ALKPHOS -- -- -- -- 65 -- -- -- -- -- -- -- 90  BILITOT -- -- -- -- 0.2* -- -- -- -- -- -- -- 0.2*  PROT -- -- -- -- 5.6* -- -- -- -- -- -- -- 6.9  ALBUMIN -- -- -- -- 2.2* -- -- -- -- -- -- -- 2.8*  APTT -- -- -- -- -- -- -- 69* -- -- -- -- --  INR 2.11* 1.43 1.94* -- -- -- -- -- -- -- -- -- --  LATICACIDVEN -- -- -- -- -- -- -- -- -- -- -- 1.3 --  TROPONINI -- -- -- -- <0.30 <0.30 <0.30 -- -- -- -- -- --  PROCALCITON -- -- -- -- -- -- -- -- -- -- -- -- --  PROBNP -- -- -- 3452.0* -- -- -- -- -- -- -- 2025.0* --  O2SATVEN -- -- -- -- -- -- -- -- -- -- -- -- --  PHART -- -- -- -- -- -- -- -- 7.250* 7.214* 7.177* -- --  PCO2ART -- -- -- -- -- -- -- -- 51.7* 55.8* 61.5* -- --  PO2ART -- -- -- -- -- -- -- -- 90.4 102.0* 112.0* -- --    Lab 06/07/12 1141 06/07/12 0735 06/06/12 2238 06/06/12 1730 06/06/12 1149  GLUCAP 230* 140* 216* 195* 195*    pCXR 06/05/12 No acute disease   ASSESSMENT / PLAN:   1) Severe copd, sob at rest despite adequate sats assoc with congested cough and retained secretions  DDX of  difficult airways managment all start with A and  include Adherence, Ace Inhibitors, Acid Reflux, Active Sinus Disease, Alpha 1 Antitripsin deficiency, Anxiety masquerading as  Airways dz,  ABPA,  allergy(esp in young), Aspiration (esp in elderly), Adverse effects of DPI,  Active smokers, plus two Bs  = Bronchiectasis and Beta blocker use..and one C= CHF   ? Active smoking > denies at present   ?  ACEi related > off them now but takes a full 6 weeks to appreciate the benefit  ? Beta blocker related > agree with bisoprolol rather than coreg  ? CHF >  Note mod LAE vs Mild RAE is c/w diast dysfunction or mitral valve dz  ? Active or acute sinus dz > sinus ct POS ACUTE SINUSITIS  ? Anxiety > try very low dose clonazepam and titrate up if needed   overall concerned spending too much time in bed and becoming overall deconditioned so desperately needs some pt/ ? Formal rehab   added flutter, mucinex dm 2/2 Changed to laba 2/3  Rx per [facility] for sinusitis reviewed 2./4    DNR appropriate but would be happy to see again as inpt or outpt at your request > signing off for now   Sandrea Hughs, MD Pulmonary and Critical Care Medicine [facility] Cell [phone] After 5:30 PM or weekends, call [phone]

## 2012-06-07 NOTE — Progress Notes (Signed)
Patient evaluated for long-term disease management services with [facility] Care Management Program. Spoke with patient at bedside to explain [facility] Care Management services. Reports he lives with his wife and plans to return home at discharge. After discussion about [facility] Care Management, patient states he is not interested. States he has [employer] and does not "want to step on their toes". Tried to explain in more detail about program and how it could benefit him. Despite details, patient pleasantly declined. Left brochure and contact information at bedside should he change his mind.   Raiford Noble, MSN-Ed, RN,BSN [facility] Liaison [phone]

## 2012-06-07 NOTE — Progress Notes (Signed)
Spoke with son and offered support. Son aware of our services from father's previous hospitalizations and his own hospitalization. Presence; listening. Will continue to support father (patient) and son.

## 2012-06-07 NOTE — Progress Notes (Signed)
Patient is active with [facility] for home health care as prior to admission; Venia Minks RN with [facility] is aware of the patient and is following the patient for DCP; B Shelba Flake

## 2012-06-07 NOTE — Progress Notes (Signed)
Inpatient Diabetes Program Recommendations  AACE/ADA: New Consensus Statement on Inpatient Glycemic Control (2013)  Target Ranges:  Prepandial:   less than 140 mg/dL      Peak postprandial:   less than 180 mg/dL (1-2 hours)      Critically ill patients:  140 - 180 mg/dL   Reason for Visit: Hyperglycemia  HPI:  The patient is a 79 y.o. year-old male with history of COPD, atrial fibrillation, diabetes, and rectal cancer s/p colostomy with multiple episodes of SBO this year s/p ex-lap on 11/2. He is currently receiving chemotherapy, last dose dose was on 1/22, which "wiped him out." He has been living at home, ambulatory, and doing well/at baseline until 5 days prior to presentation. His family contributes to the history at the patient is still confused and has bipap on. Five days ago, his appetite decreased. He did not complain of nausea, but he did not want to eat. Results for DANIS, PEMBLETON (MRN [id number]) as of 06/07/2012 09:24  Ref. Range 06/06/2012 08:00 06/06/2012 11:49 06/06/2012 17:30 06/06/2012 22:38 06/07/2012 07:35  Glucose-Capillary Latest Range: 70-99 mg/dL 409 (H) 811 (H) 914 (H) 216 (H) 140 (H)    Results for JONAVON, TRIEU (MRN [id number]) as of 06/07/2012 09:24  Ref. Range 06/06/2012 06:40  Sodium Latest Range: 136-145 mEq/L 136  Potassium Latest Range: 3.5-5.1 mEq/L 4.5  Chloride Latest Range: 96-112 mEq/L 104  CO2 Latest Range: 19-32 mEq/L 24  BUN Latest Range: 7.0-26.0 mg/dL 43 (H)  Creatinine Latest Range: 0.50-1.35 mg/dL 0.86  Calcium Latest Range: 8.4-10.5 mg/dL 8.7  GFR calc non Af Amer Latest Range: >90 mL/min 54 (L)  GFR calc Af Amer Latest Range: >90 mL/min 63 (L)  Glucose Latest Range: 70-99 mg/dl 578 (H)   Inpatient Diabetes Program Recommendations Insulin - Meal Coverage: Add meal coverage insulin while on steroids - Novolog 3 units tidwc if pt eats >50% meal HgbA1C: Check HgbA1C to assess glycemic control prior to hospitalization - Last one - 02/07/2010 - 6.0%  Note:  Will continue to follow.  Thank you. Ailene Ards, RD, LDN, CDE Inpatient Diabetes Coordinator [phone]

## 2012-06-07 NOTE — Progress Notes (Signed)
Room  1426 - [name] - PMT-Palliative Medicine Team - RN Liaison Visit  PMT consult ordered for GOC, hospice options.  Discussed with pt and his POA (sister Vincent [name] [phone]) -she states she just got off the phone with Dr. [name], pt, son and dtr discussing everything and didn't see the need to schedule another appointment.   Advised sister Dr. [name] will place a note in the chart and we will follow up as needed.  No PMT meeting scheduled at this time.  Will follow.    Please call PMT phone @ [phone] with any questions.  Thank you. [name], RN  Palliative Medicine Team RN Liaison    [phone]

## 2012-06-07 NOTE — Progress Notes (Addendum)
[facility] PROGRESS NOTE  [name]:[id number] DOB: March 27, 1933 DOA: 06/02/2012 PCP: Aura Dials, MD  Assessment/Plan  The patient is a 79 yo M with hx of rectal cancer s/p colostomy and recurrent SBO, currently on chemotherapy who presented with several days of N/V/D, severe dehydration, and respiratory failure requiring bipap.  He was hydrated, transitioned back to nasal canula.  He was c. Diff positive and was started on treatment.  He has been very [name] of breath and deconditioned.  He is going to meet again with oncology today to discuss his chemotherapy and will also meet with palliative care to discuss symptom management, goals of care, and possibility of hospice.  He refuses SNF and wants to go home.  He lives with his wife who has dementia.  A family member may be able to stay with them for a few days.  Will need [facility] home services.    Acute on chronic respiratory failure with hypercapnea: Flu and troponins neg.  CXR appears clear, but very hyperinflated with flattened diaphragms suggestive of COPD.  Shortness of breath and full expiratory wheeze improving slowly.   - Continue nasal canula at home dose 2L with home bipap overnight  - Transition to prednisone tomorrow - Continue budesonide and brovana - Duonebs prn   -  Appreciate Pulmonary recommendations -  Add morphine as needed for dyspnea  Bilateral ethmoid sinusitis -  Avoid augmentin and clinda in the setting of active C. Diff diarrhea -  Levofloxacin x 3 weeks -  Continue nasal saline, flonase, afrin  C. Diff Diarrhea, mild, with more formed stools -  Flagyl, day 5 today.  Spoke with pharmacist who states she is having a difficult time getting patient's INR stable at goal and requests changing to vancomycin for now -  D/c flagyl and start vanco -  stool culture neg  Slurred speech:  May be side effect of medication (patient has dry mouth) and patient is on frequent atrovent and was started on clonazepam  which may have contributed.  Speech improved today.  Family VERY concerned about stroke.   -  MRI today -  Consider transitioning warfarin to new oral anticoagulant, but given increased risk of GIB, would not start now in the setting of C. Diff diarrhea and occult positive stool.      PAF (paroxysmal atrial fibrillation) with RVR in ER that responded to IVF. supratherapeutic INR is likely due to diarrhea.  HR in 80s this morning.  Has had a couple episodes of nonsustained V-tach, up to 16 beats.   - d/c telemetry  -  Continue bisoprolol -  Continue digoxin  -  Electrolytes have been stable  DM (diabetes mellitus) with elevated FS and on steroids, elevated FS today, but reducing steroids - Continue high dose SSI and wean steroids  HTN (hypertension), blood pressure low normal to hypotensive, but stable  Chronic diastolic CHF (congestive heart failure), grade 1:  - Monitor carefully for evidence of hypervolemia, currently appears euvolemic  -  Strict I/O  Rectal cancer:  Patient was discussion palliative care today with me.  -  Will touch base with Dr. Clelia Croft on Tuesday.    Acute kidney injury, likely prerenal and some ATN from dehydration and hypotesion, trended down with hydration.   -  D/c'd labs as patient states he would just like to be comfortable.   Thrombocytopenia, trended down slightly but generally stable.  No heparin administered.   -  Occult stool positive, but no gross blood -  Haptoglobin/LDH wnl -  Smear review without schistocytes -  Fibrinogen elevated  Weakness generalized:  PT/OT recommending home health PT/OT   Diet:  Diabetic diet Access: PIV  IVF:  OFF Proph:  warfarin   Code Status: DNR/DNI  Family Communication:  Spoke with patient and his daughter Selena Batten, son, and Geraldine Contras the [place].  Family:  daughter Selena Batten.  Sister Geraldine Contras is his power of attorney because wife has Alzheimer's: [phone].   Disposition Plan:  Pending oncology visit, palliative care consult,  improvement in dyspnea, home services arranged (PT/OT/aid/RN).    Consultants:  Pulmonology  Palliative care  Oncology  Procedures:  none  Antibiotics:  Levofloxacin 1/30 >> Duration of 3 weeks for chronic sinusitis  Flagyl 1/31 >> 2/4  Vanco oral 2/4 >>  Continue for an additional 1-2 weeks post completion of levofloxacin therapy  HPI/Subjective:  Patient states that he feels very Vincent [name] of breath but is closer to his baseline. Has cough productive of white phlegm.  Denies nausea and vomiting and eating some.  Has had more formed stool in his ostomy but still soupy.  Denies chest pain.  Sleepy today.    Objective: Filed Vitals:   06/06/12 2037 06/06/12 2234 06/07/12 0620 06/07/12 0902  BP:  116/58 118/61   Pulse:  60 70   Temp:  97.3 F (36.3 C) 97.5 F (36.4 C)   TempSrc:  Axillary Axillary   Resp: 18 18 20    Height:      Weight:   83.961 kg (185 lb 1.6 oz)   SpO2:  100% 97% 95%    Intake/Output Summary (Last 24 hours) at 06/07/12 1406 Last data filed at 06/07/12 1330  Gross per 24 hour  Intake   1010 ml  Output   1900 ml  Net   -890 ml   Filed Weights   06/05/12 0524 06/06/12 0500 06/07/12 0620  Weight: 81.012 kg (178 lb 9.6 oz) 83.9 kg (184 lb 15.5 oz) 83.961 kg (185 lb 1.6 oz)    Exam:   General:  Thin CM, mild respiratory distress with SCM and subcostal retractions and pursed lip breathing.  Mildly gray skin.    HEENT:  MMM  Cardiovascular:  IRRR, distant heart sounds, no obvious murmur, 2+ pulses  Respiratory:  Diminished bilateral breath sounds with scattered course rales, very high pitched wheeze throughout and prolonged expiratory phase.  Abdomen:  NABS, soft, ND/NT, ostomy with liquid stool.    MSK:  Normal tone and bulk, trace lower extremity edema  Skin:  Normal skin turgor   Neuro:  Awake, alert.  No focal deficits on strength exam.  No dysmetria.  No confusion.  Sensation intact.  No slurred speech this morning.    Data  Reviewed: Basic Metabolic Panel:  Lab 06/06/12 8657 06/05/12 0500 06/04/12 0515 06/03/12 0540 06/02/12 1744  NA 136 134* 134* 134* 133*  K 4.5 4.5 4.8 4.7 4.8  CL 104 105 104 104 101  CO2 24 23 23 23 22   GLUCOSE 269* 164* 165* 144* 201*  BUN 43* 46* 42* 40* 35*  CREATININE 1.24 1.46* 1.35 1.37* 1.51*  CALCIUM 8.7 9.0 8.7 8.6 8.8  MG -- 1.9 1.9 -- --  PHOS -- -- -- -- --   Liver Function Tests:  Lab 06/03/12 0540 06/02/12 1120  AST 18 19  ALT 14 14  ALKPHOS 65 90  BILITOT 0.2* 0.2*  PROT 5.6* 6.9  ALBUMIN 2.2* 2.8*   No results found for this basename: LIPASE:5,AMYLASE:5 in the  last 168 hours No results found for this basename: AMMONIA:5 in the last 168 hours CBC:  Lab 06/06/12 0640 06/05/12 0500 06/04/12 0515 06/03/12 0930 06/03/12 0540 06/02/12 1120  WBC 4.3 6.5 5.3 6.4 4.8 --  NEUTROABS -- -- -- -- -- 8.0*  HGB 9.1* 9.4* 9.3* 10.0* 9.8* --  HCT 28.7* 29.2* 29.8* 31.3* 30.8* --  MCV 95.7 95.7 95.8 95.4 95.7 --  PLT 123* 138* 138* 144* 137* --   Cardiac Enzymes:  Lab 06/03/12 0540 06/02/12 2350 06/02/12 1744 06/02/12 1131  CKTOTAL -- -- -- --  CKMB -- -- -- --  CKMBINDEX -- -- -- --  TROPONINI <0.30 <0.30 <0.30 <0.30   BNP (last 3 results)  Basename 06/04/12 0515 06/02/12 1131 03/13/12 0515  PROBNP 3452.0* 2025.0* 2539.0*   CBG:  Lab 06/07/12 1141 06/07/12 0735 06/06/12 2238 06/06/12 1730 06/06/12 1149  GLUCAP 230* 140* 216* 195* 195*    Recent Results (from the past 240 hour(s))  URINE CULTURE     Status: Normal   Collection Time   06/02/12 11:40 AM      Component Value Range Status Comment   Specimen Description URINE, CATHETERIZED   Final    Special Requests NONE   Final    Culture  Setup Time 06/02/2012 16:04   Final    Colony Count NO GROWTH   Final    Culture NO GROWTH   Final    Report Status 06/03/2012 FINAL   Final   MRSA PCR SCREENING     Status: Normal   Collection Time   06/02/12  4:12 PM      Component Value Range Status Comment   MRSA  by PCR NEGATIVE  NEGATIVE Final   STOOL CULTURE     Status: Normal   Collection Time   06/03/12  9:30 AM      Component Value Range Status Comment   Specimen Description STOOL   Final    Special Requests Normal   Final    Culture     Final    Value: NO SALMONELLA, SHIGELLA, CAMPYLOBACTER, YERSINIA, OR E.COLI 0157:H7 ISOLATED   Report Status 06/06/2012 FINAL   Final   CLOSTRIDIUM DIFFICILE BY PCR     Status: Abnormal   Collection Time   06/03/12  9:30 AM      Component Value Range Status Comment   C difficile by pcr POSITIVE (*) NEGATIVE Final      Studies: Dg Chest Port 1 View  06/05/2012  *RADIOLOGY REPORT*  Clinical Data: Shortness of breath, weakness.  PORTABLE CHEST - 1 VIEW  Comparison: 06/02/2012  Findings: Left subclavian port catheter stable, tip mid SVC. Lungs clear.  Heart size and pulmonary vascularity normal.  No effusion. Visualized bones unremarkable.  IMPRESSION: No acute disease   Original Report Authenticated By: D. Andria Rhein, MD    Ct Maxillofacial  Ltd Wo Cm  06/07/2012  *RADIOLOGY REPORT*  Clinical Data:  Persistent cough  CT PARANASAL SINUS LIMITED WITHOUT CONTRAST  Technique:  Multidetector CT images of the paranasal sinuses were obtained in a single plane without contrast.  Comparison:   None.  Findings:  Air fluid levels in the maxillary sinuses bilaterally.  Moderate mucosal thickening / opacification of the ethmoid sinus air cells bilaterally.  The air fluid levels within the sphenoid sinus air cells bilaterally.  Left sphenoid sinus air cell is dominant.  The sphenoid septum is directed towards the left internal carotid artery.  Visualized frontal sinuses are clear.  Visualized mastoid air cells and middle ear cavities are clear.  Visualized intracranial structures reveals atrophy.  Visualized orbital structures unremarkable.  IMPRESSION: Air fluid levels in the maxillary sinuses bilaterally.  Moderate mucosal thickening / opacification of the ethmoid sinus air cells  bilaterally.  Air fluid levels within the sphenoid sinus air cells bilaterally. Left sphenoid sinus air cell is dominant.  The sphenoid septum is directed towards the left internal carotid artery.  Visualized frontal sinuses are clear.   Original Report Authenticated By: Lacy Duverney, M.D.     Scheduled Meds:    . antiseptic oral rinse  15 mL Mouth Rinse q12n4p  . arformoterol  15 mcg Nebulization BID  . aspirin EC  81 mg Oral Daily  . bisoprolol  5 mg Oral Daily  . budesonide (PULMICORT) nebulizer solution  0.5 mg Nebulization BID  . chlorhexidine  15 mL Mouth Rinse BID  . dextromethorphan-guaiFENesin  2 tablet Oral BID  . digoxin  0.125 mg Oral Daily  . fluticasone  2 spray Each Nare Daily  . insulin aspart  0-20 Units Subcutaneous TID WC  . insulin aspart  0-5 Units Subcutaneous QHS  . methylPREDNISolone (SOLU-MEDROL) injection  80 mg Intravenous Daily  . oxymetazoline  1 spray Each Nare BID  . pantoprazole  40 mg Oral BID AC  . simvastatin  40 mg Oral QHS  . sodium chloride  1,000 mL Intravenous Once  . sodium chloride  3 mL Intravenous Q12H  . vancomycin  125 mg Oral Q6H  . Warfarin - Pharmacist Dosing Inpatient   Does not apply q1800   Continuous Infusions:    . sodium chloride 20 mL/hr (06/06/12 0836)    Principal Problem:  *Acute respiratory failure with hypercapnia Active Problems:  Abdominal pain  Thrombocytopenia  Hyponatremia  Weakness generalized  Anemia  PAF (paroxysmal atrial fibrillation)  DM (diabetes mellitus)  HTN (hypertension)  Chronic diastolic CHF (congestive heart failure)  C. difficile diarrhea  Dehydration  Acute kidney injury  Anticoagulation excessive  Tobacco abuse  COPD (chronic obstructive pulmonary disease)    Time spent: 30 min    Cerena Baine, [facility]  [facility] Pager [phone]. If 7PM-7AM, please contact night-coverage at [email or web address], password [facility] 06/07/2012, 2:06 PM  LOS: 5 days

## 2012-06-07 NOTE — Progress Notes (Signed)
Pt has been wearing BiPap prior till tonight. Pt states that he would like to wear canula tonight. He said he will call if he has any concerns. RN notified. RT will continue  To monitor.

## 2012-06-07 NOTE — Progress Notes (Signed)
PT Cancellation Note  ___Treatment cancelled today due to medical issues with patient which prohibited therapy  _X_ Treatment cancelled today due to patient out of room receiving procedure or test .......Marland Kitchenbrain MRI  ___ Treatment cancelled today due to patient's refusal to participate   ___ Treatment cancelled today due to  Felecia Shelling  PTA [facility]  Acute  Rehab Pager      [phone]

## 2012-06-07 NOTE — Progress Notes (Signed)
Follow up from this morning's visit with son per his request. Patient awake. Offered support. He did not need chaplain support at this time.

## 2012-06-07 NOTE — Progress Notes (Signed)
  ANTICOAGULATION CONSULT NOTE - Follow Up Consult  Pharmacy Consult for Coumadin Indication: atrial fibrillation  No Known Allergies  Patient Measurements: Height: 5\' 11"  (180.3 cm) Weight: 185 lb 1.6 oz (83.961 kg) IBW/kg (Calculated) : 75.3  Heparin Dosing Weight:   Vital Signs: Temp: 97.5 F (36.4 C) (02/04 0620) Temp src: Axillary (02/04 0620) BP: 118/61 mmHg (02/04 0620) Pulse Rate: 70  (02/04 0620)  Labs:  Basename 06/07/12 0442 06/06/12 0640 06/05/12 0500  HGB -- 9.1* 9.4*  HCT -- 28.7* 29.2*  PLT -- 123* 138*  APTT -- -- --  LABPROT 22.8* 17.1* 21.4*  INR 2.11* 1.43 1.94*  HEPARINUNFRC -- -- --  CREATININE -- 1.24 1.46*  CKTOTAL -- -- --  CKMB -- -- --  TROPONINI -- -- --    Estimated Creatinine Clearance: 53.1 ml/min (by C-G formula based on Cr of 1.24).   Medications:  Home dose: 5 mg daily except 2.5 mg on Mon & Fri per previous med list, but the current instructions could not be verified.  Assessment: 79 year-old male on chronic coumadin for PAF admitted 1/20 with CC: confusion and weakness.  PMH significant for: atrial fibrillation, rectal CA s/p colostomy, last chemo 05/25/12, AAA without rupture (02/2012) Admission INR was supratherapeutic therefore coumadin held 3 days and pt given Vit K.  INR increased significantly overnight after only 2 days of coumadin, currently 2.11.   No bleeding reported in chart notes or per RN No new CBC today -Plts have been declining: 263 on admit, now 123. Hgb low, 9.1- must watch carefully.  S/p 5 days Levaquin for acute resp failure/COPD and flagyl D#5/14 (through 2/14) for CDiff which can both elevate INR I anticipate that INR will continue to rise, especially with flagyl interaction, therefore will hold coumadin tonight.     Goal of Therapy:  INR 2-3 Monitor platelets by anticoagulation protocol: Yes   Plan:  1.  Hold coumadin tonight 2.  F/u daily PT/INR,  Haynes Hoehn, PharmD 06/07/2012 11:44 AM  Pager:  [phone]

## 2012-06-08 ENCOUNTER — Ambulatory Visit: Payer: Medicare Other | Admitting: Oncology

## 2012-06-08 ENCOUNTER — Inpatient Hospital Stay: Payer: Medicare Other

## 2012-06-08 ENCOUNTER — Other Ambulatory Visit: Payer: Medicare Other | Admitting: Lab

## 2012-06-08 DIAGNOSIS — J329 Chronic sinusitis, unspecified: Secondary | ICD-10-CM

## 2012-06-08 DIAGNOSIS — R5381 Other malaise: Secondary | ICD-10-CM

## 2012-06-08 DIAGNOSIS — I714 Abdominal aortic aneurysm, without rupture: Secondary | ICD-10-CM

## 2012-06-08 DIAGNOSIS — C20 Malignant neoplasm of rectum: Secondary | ICD-10-CM

## 2012-06-08 DIAGNOSIS — R0609 Other forms of dyspnea: Secondary | ICD-10-CM

## 2012-06-08 DIAGNOSIS — Z515 Encounter for palliative care: Secondary | ICD-10-CM

## 2012-06-08 LAB — PROTIME-INR
INR: 2.21 — ABNORMAL HIGH (ref 0.00–1.49)
Prothrombin Time: 23.6 seconds — ABNORMAL HIGH (ref 11.6–15.2)

## 2012-06-08 LAB — GLUCOSE, CAPILLARY: Glucose-Capillary: 193 mg/dL — ABNORMAL HIGH (ref 70–99)

## 2012-06-08 MED ORDER — WARFARIN SODIUM 2.5 MG PO TABS
2.5000 mg | ORAL_TABLET | Freq: Once | ORAL | Status: AC
  Administered 2012-06-08: 2.5 mg via ORAL
  Filled 2012-06-08 (×2): qty 1

## 2012-06-08 NOTE — Progress Notes (Signed)
  ANTICOAGULATION CONSULT NOTE - Follow Up Consult  Pharmacy Consult for Coumadin Indication: atrial fibrillation  No Known Allergies  Patient Measurements: Height: 5\' 11"  (180.3 cm) Weight: 176 lb 1.6 oz (79.878 kg) IBW/kg (Calculated) : 75.3  Heparin Dosing Weight:   Vital Signs: Temp: 97.7 F (36.5 C) (02/05 0528) Temp src: Oral (02/05 0528) BP: 124/59 mmHg (02/05 0528) Pulse Rate: 77  (02/05 0528)  Labs:  Basename 06/08/12 0605 06/07/12 0442 06/06/12 0640  HGB -- -- 9.1*  HCT -- -- 28.7*  PLT -- -- 123*  APTT -- -- --  LABPROT 23.6* 22.8* 17.1*  INR 2.21* 2.11* 1.43  HEPARINUNFRC -- -- --  CREATININE -- -- 1.24  CKTOTAL -- -- --  CKMB -- -- --  TROPONINI -- -- --    Estimated Creatinine Clearance: 53.1 ml/min (by C-G formula based on Cr of 1.24).   Medications:  Home dose: 5 mg daily except 2.5 mg on Mon & Fri per previous med list, but the current instructions could not be verified.  Assessment: 79 year-old male on chronic coumadin for PAF admitted 1/20 with CC: confusion and weakness.  PMH significant for: atrial fibrillation, rectal CA s/p colostomy, last chemo 05/25/12, AAA without rupture (02/2012) Admission INR was supratherapeutic therefore coumadin held 3 days and pt given Vit K.  INR increased significantly overnight after only 2 days of coumadin, held last night.  INR today is tx @ 2.21.   No bleeding reported in chart notes or per RN No new CBC today -Plts have been declining: 263 on admit, now 123. Hgb low, 9.1- must watch carefully.  DDIs:  Levaquin and flagyl can both cause increased serum concentrations of warfarin and elevated INRs.  Pt to remain on Levaquin for 3 weeks for bilateral ethmoid sinusitis.  Dr. [name] ok'd switch from flagyl to po vanc for CDiff to avoid DDI with warfarin.   Will resume coumadin tonight at low dose  Goal of Therapy:  INR 2-3 Monitor platelets by anticoagulation protocol: Yes   Plan:  1.  Coumadin 2.5mg  po x 1  tonight 2.  F/u daily PT/INR,  Haynes Hoehn, PharmD 06/08/2012 12:51 PM  Pager: [phone]

## 2012-06-08 NOTE — Progress Notes (Signed)
Patient ID: Vincent [name], male   DOB: Jan 14, 1933, 79 y.o.   MRN: [id number]  [facility] PROGRESS NOTE  LAWYER WASHABAUGH WUJ:[id number] DOB: March 23, 1933 DOA: 06/02/2012 PCP: Aura Dials, MD  Brief narrative: The patient is a 79 yo M with hx of rectal cancer s/p colostomy and recurrent SBO, currently on chemotherapy, severe COPD, oxygen dependent 2 L, continuous flow who presented with several days of N/V/D, severe dehydration, and progressively worsening shortness of breath that initially started 6 weeks prior to this admission while patient was recovering from recurrent colon cancer and undergoing chemotherapy.Marland Kitchen He was hydrated, transitioned back to nasal canula. He was c. Diff positive and was started on treatment. Required an admission 01/31. At baseline, patient uses Combivent nebulizers as maintenance treatment, did not feel Spiriva helped.  Acute on chronic respiratory failure with hypercapnea:  - Secondary to COPD extirpation - Patient is clinically improving and maintaining oxygen saturations at target range on nasal cannula - Patient is on oxygen at home, 2 L continuous flow - Plan on transitioning to prednisone today - Continue budesonide, duonebs prn  - Appreciate Pulmonary recommendations  - Add morphine as needed for dyspnea  Bilateral ethmoid sinusitis  - Levofloxacin x 3 weeks  - Continue nasal saline, flonase, afrin  C. Diff Diarrhea, mild, with more formed stools  - Patient was transition from Flagyl to oral vancomycin due to difficulty adjusting INR - stool culture neg  Slurred speech:  - Now resolved - MRI of the brain negative PAF (paroxysmal atrial fibrillation) with RVR in ER that responded to IVF.  - Rate controlled, plan on discontinuing telemetry  - Continue bisoprolol  - Continue digoxin  - Electrolytes have been stable  DM (diabetes mellitus)  - Continue high dose SSI and wean steroids  HTN (hypertension) - blood pressure stable Chronic diastolic  CHF (congestive heart failure), grade 1:  - Monitor carefully for evidence of hypervolemia, currently appears euvolemic  - Strict I/O  Rectal cancer:  - Plan to meed with PCT today  Acute kidney failure  - likely prerenal and some ATN from dehydration and hypotesion, trended down with hydration.  - D/c'd labs as patient states he would just like to be comfortable.  Weakness generalized:  - PT/OT recommending home health PT/OT   Code Status: DNR/DNI   Consultants:  Pulmonology  Palliative care  Oncology Antibiotics:  Levofloxacin 1/30 >> Duration of 3 weeks for chronic sinusitis  Flagyl 1/31 >> 2/4  Vanco oral 2/4 >> Continue for an additional 1-2 weeks post completion of levofloxacin therapy Procedures/Studies: Dg Eye Foreign Body 06/07/2012   Negative for radiopaque orbital foreign body.  Metal in the right supraorbital scalp.  The patient is cleared for MRI.   Mr Brain Wo Contrast 06/07/2012   No acute intracranial abnormality.  Sinusitis with multiple air-fluid levels.    Ct Maxillofacial  Ltd Wo Cm 06/07/2012    Air fluid levels in the maxillary sinuses bilaterally.  Moderate mucosal thickening / opacification of the ethmoid sinus air cells bilaterally.  Air fluid levels within the sphenoid sinus air cells bilaterally. Left sphenoid sinus air cell is dominant.  The sphenoid septum is directed towards the left internal carotid artery.  Visualized frontal sinuses are clear  HPI/Subjective: No events overnight.   Objective: Filed Vitals:   06/07/12 2040 06/07/12 2052 06/08/12 0528 06/08/12 0823  BP: 106/66  124/59   Pulse: 87 68 77   Temp: 98.1 F (36.7 C)  97.7 F (36.5 C)  TempSrc: Oral  Oral   Resp: 18 18 18    Height:      Weight:   79.878 kg (176 lb 1.6 oz)   SpO2: 100% 98% 100% 100%    Intake/Output Summary (Last 24 hours) at 06/08/12 1446 Last data filed at 06/08/12 1000  Gross per 24 hour  Intake   1620 ml  Output   1700 ml  Net    -80 ml     Exam:   General:  Pt is alert, follows commands appropriately, not in acute distress  Cardiovascular: Irregular rate and rhythm, no rubs, no gallops  Respiratory: Clear to auscultation bilaterally, decreased breath sounds at bases  Abdomen: Soft, non tender, non distended, bowel sounds present, no guarding  Extremities: No edema, pulses DP and PT palpable bilaterally  Neuro: Grossly nonfocal  Data Reviewed: Basic Metabolic Panel:  Lab 06/06/12 1610 06/05/12 0500 06/04/12 0515 06/03/12 0540 06/02/12 1744  NA 136 134* 134* 134* 133*  K 4.5 4.5 4.8 4.7 4.8  CL 104 105 104 104 101  CO2 24 23 23 23 22   GLUCOSE 269* 164* 165* 144* 201*  BUN 43* 46* 42* 40* 35*  CREATININE 1.24 1.46* 1.35 1.37* 1.51*  CALCIUM 8.7 9.0 8.7 8.6 8.8  MG -- 1.9 1.9 -- --  PHOS -- -- -- -- --   Liver Function Tests:  Lab 06/03/12 0540 06/02/12 1120  AST 18 19  ALT 14 14  ALKPHOS 65 90  BILITOT 0.2* 0.2*  PROT 5.6* 6.9  ALBUMIN 2.2* 2.8*   CBC:  Lab 06/06/12 0640 06/05/12 0500 06/04/12 0515 06/03/12 0930 06/03/12 0540 06/02/12 1120  WBC 4.3 6.5 5.3 6.4 4.8 --  NEUTROABS -- -- -- -- -- 8.0*  HGB 9.1* 9.4* 9.3* 10.0* 9.8* --  HCT 28.7* 29.2* 29.8* 31.3* 30.8* --  MCV 95.7 95.7 95.8 95.4 95.7 --  PLT 123* 138* 138* 144* 137* --   Cardiac Enzymes:  Lab 06/03/12 0540 06/02/12 2350 06/02/12 1744 06/02/12 1131  CKTOTAL -- -- -- --  CKMB -- -- -- --  CKMBINDEX -- -- -- --  TROPONINI <0.30 <0.30 <0.30 <0.30   CBG:  Lab 06/08/12 1155 06/08/12 0747 06/07/12 2039 06/07/12 1638 06/07/12 1141  GLUCAP 127* 92 93 143* 230*    Recent Results (from the past 240 hour(s))  URINE CULTURE     Status: Normal   Collection Time   06/02/12 11:40 AM      Component Value Range Status Comment   Specimen Description URINE, CATHETERIZED   Final    Special Requests NONE   Final    Culture  Setup Time 06/02/2012 16:04   Final    Colony Count NO GROWTH   Final    Culture NO GROWTH   Final    Report  Status 06/03/2012 FINAL   Final   MRSA PCR SCREENING     Status: Normal   Collection Time   06/02/12  4:12 PM      Component Value Range Status Comment   MRSA by PCR NEGATIVE  NEGATIVE Final   STOOL CULTURE     Status: Normal   Collection Time   06/03/12  9:30 AM      Component Value Range Status Comment   Specimen Description STOOL   Final    Special Requests Normal   Final    Culture     Final    Value: NO SALMONELLA, SHIGELLA, CAMPYLOBACTER, YERSINIA, OR E.COLI 0157:H7 ISOLATED   Report Status  06/06/2012 FINAL   Final   CLOSTRIDIUM DIFFICILE BY PCR     Status: Abnormal   Collection Time   06/03/12  9:30 AM      Component Value Range Status Comment   C difficile by pcr POSITIVE (*) NEGATIVE Final      Scheduled Meds:   . arformoterol  15 mcg Nebulization BID  . aspirin EC  81 mg Oral Daily  . bisoprolol  5 mg Oral Daily  . budesonide neb  0.5 mg Nebulization BID  . digoxin  0.125 mg Oral Daily  . fluticasone  2 spray Each Nare Daily  . insulin aspart  0-20 Units Subcutaneous TID WC  . insulin aspart  0-5 Units Subcutaneous QHS  . levofloxacin  750 mg Oral Q2000  . pantoprazole  40 mg Oral BID AC  . predniSONE  60 mg Oral Q breakfast  . simvastatin  40 mg Oral QHS  . vancomycin  125 mg Oral Q6H  . warfarin  2.5 mg Oral ONCE-1800   Continuous Infusions:   . sodium chloride 20 mL/hr (06/08/12 1040)     Debbora Presto, MD  [facility] Pager [phone]  If 7PM-7AM, please contact night-coverage [email or web address] Password [email or web address] 06/08/2012, 2:46 PM   LOS: 6 days

## 2012-06-08 NOTE — Progress Notes (Signed)
Pt does not wish to wear CPAP again tonight. He stated that he will wear his when he goes home. He said that his nose is raw inside and does not want to wear it. RN will be notified. RT will continue to monitor.

## 2012-06-08 NOTE — Progress Notes (Signed)
ANTIBIOTIC CONSULT NOTE - FOLLOW UP  Pharmacy Consult for Levaquin Indication: BL ethmoid sinusitis  No Known Allergies  Patient Measurements: Height: 5\' 11"  (180.3 cm) Weight: 176 lb 1.6 oz (79.878 kg) IBW/kg (Calculated) : 75.3  Adjusted Body Weight:   Vital Signs: Temp: 97.7 F (36.5 C) (02/05 0528) Temp src: Oral (02/05 0528) BP: 124/59 mmHg (02/05 0528) Pulse Rate: 77  (02/05 0528) Intake/Output from previous day: 02/04 0701 - 02/05 0700 In: 1860 [P.O.:1620; I.V.:240] Out: 1700 [Urine:1500; Stool:200] Intake/Output from this shift: Total I/O In: 240 [P.O.:240] Out: 450 [Urine:200; Stool:250]  Labs:  [facility] 06/06/12 0640  WBC 4.3  HGB 9.1*  PLT 123*  LABCREA --  CREATININE 1.24   Estimated Creatinine Clearance: 53.1 ml/min (by C-G formula based on Cr of 1.24). No results found for this basename: VANCOTROUGH:2,VANCOPEAK:2,VANCORANDOM:2,GENTTROUGH:2,GENTPEAK:2,GENTRANDOM:2,TOBRATROUGH:2,TOBRAPEAK:2,TOBRARND:2,AMIKACINPEAK:2,AMIKACINTROU:2,AMIKACIN:2, in the last 72 hours   Microbiology: Recent Results (from the past 720 hour(s))  URINE CULTURE     Status: Normal   Collection Time   06/02/12 11:40 AM      Component Value Range Status Comment   Specimen Description URINE, CATHETERIZED   Final    Special Requests NONE   Final    Culture  Setup Time 06/02/2012 16:04   Final    Colony Count NO GROWTH   Final    Culture NO GROWTH   Final    Report Status 06/03/2012 FINAL   Final   MRSA PCR SCREENING     Status: Normal   Collection Time   06/02/12  4:12 PM      Component Value Range Status Comment   MRSA by PCR NEGATIVE  NEGATIVE Final   STOOL CULTURE     Status: Normal   Collection Time   06/03/12  9:30 AM      Component Value Range Status Comment   Specimen Description STOOL   Final    Special Requests Normal   Final    Culture     Final    Value: NO SALMONELLA, SHIGELLA, CAMPYLOBACTER, YERSINIA, OR E.COLI 0157:H7 ISOLATED   Report Status 06/06/2012 FINAL    Final   CLOSTRIDIUM DIFFICILE BY PCR     Status: Abnormal   Collection Time   06/03/12  9:30 AM      Component Value Range Status Comment   C difficile by pcr POSITIVE (*) NEGATIVE Final     Anti-infectives     Start     Dose/Rate Route Frequency Ordered Stop   06/07/12 2000   levofloxacin (LEVAQUIN) tablet 750 mg        750 mg Oral Daily 06/07/12 1442     06/07/12 1200   vancomycin (VANCOCIN) 50 mg/mL oral solution 125 mg        125 mg Oral 4 times per day 06/07/12 1156 06/17/12 1159   06/03/12 1430   metroNIDAZOLE (FLAGYL) tablet 500 mg  Status:  Discontinued        500 mg Oral 3 times per day 06/03/12 1353 06/07/12 1154   06/02/12 1800   levofloxacin (LEVAQUIN) IVPB 750 mg  Status:  Discontinued        750 mg 100 mL/hr over 90 Minutes Intravenous Every 48 hours 06/02/12 1738 06/07/12 1144          Assessment: 79 yoM on D#7 levaquin for BL ethmoid sinusitis with planned therapy x 3 weeks.  Pt also on PO vancomycin for CDiff tx.    Currently Afeb  WBC WNL  Renal: SCr ok, CrCl ~53  ml/min  Levaquin changed from q 48h to q 24h on 2/4 due to improved CrCl > 52ml/min.   DDI with warfarin - may cause elevations in INR, will f/u closely. Also can cause/exacerbate CDiff.     Plan:  1.  Continue levaquin 750mg  PO q 24 hours 2.  F/u renal fxn, clinical course  Haynes Hoehn, PharmD 06/08/2012 1:08 PM  Pager: [phone]

## 2012-06-08 NOTE — Progress Notes (Signed)
Occupational Therapy Treatment Patient Details Name: Vincent [name] MRN: [id number] DOB: 01-21-1933 Today's Date: 06/08/2012 Time: 4098-1191 OT Time Calculation (min): 14 min  OT Assessment / Plan / Recommendation Comments on Treatment Session Pt continues to fatigue quickly on 2L of O2. Only agreeable to back to bed.    Follow Up Recommendations  Home health OT    Barriers to Discharge       Equipment Recommendations  None recommended by OT    Recommendations for Other Services    Frequency Min 2X/week   Plan Discharge plan remains appropriate    Precautions / Restrictions Precautions Precautions: Fall Precaution Comments: home O2 2 lts past 2 years   Pertinent Vitals/Pain Denied pain    ADL  Toilet Transfer: Min Pension scheme manager Method: Sit to stand ADL Comments: Pt only agreeable to allow therapist to assist him back to bed.    OT Diagnosis:    OT Problem List:   OT Treatment Interventions:     OT Goals ADL Goals ADL Goal: Toilet Transfer - Progress: Progressing toward goals  Visit Information  Last OT Received On: 06/08/12 Assistance Needed: +1    Subjective Data  Subjective: I think I need to go back to bed.   Prior Functioning       Cognition  Cognition Overall Cognitive Status: Appears within functional limits for tasks assessed/performed Arousal/Alertness: Awake/alert Orientation Level: Appears intact for tasks assessed Behavior During Session: [facility] for tasks performed    Mobility  Bed Mobility Bed Mobility: Scooting to HOB Sit to Supine: 5: Supervision Scooting to [facility]: 5: Supervision Details for Bed Mobility Assistance: No assistance needed just increased time and use of rail Transfers Sit to Stand: 4: Min guard Stand to Sit: 4: Min guard Details for Transfer Assistance: increased time slightly slow and sluggish    Exercises      Balance Static Standing Balance Static Standing - Balance Support: Bilateral upper extremity  supported Static Standing - Level of Assistance: 5: Stand by assistance   End of Session    GO     Dravin Lance A OTR/L [phone] 06/08/2012, 2:41 PM

## 2012-06-08 NOTE — Progress Notes (Signed)
Follow-up  call back from patient's son Roe Coombs who confirmed PMT meeting scheduled for today Wed 2/5 @ 4:00 pm  Valente David, RN 06/08/2012, 10:01 AM Palliative Medicine Team RN Liaison [phone]

## 2012-06-08 NOTE — Progress Notes (Signed)
Physical Therapy Treatment Patient Details Name: Vincent [name] MRN: [id number] DOB: 1932-07-18 Today's Date: 06/08/2012 Time: 5284-1324 PT Time Calculation (min): 43 min  PT Assessment / Plan / Recommendation Comments on Treatment Session  Pt appears at prior level.  Admits he only leaves the house for MD appts and has been on O2 past 2 years. Assisted pt OOB to BR in which he performed own hygiene and colostomy care. Amb to recliner with no LOB.  Pt demon good safety cognition.  Pt main c/o is lack of sleep here at the hospital.  Pt eager to D/C to home where he states his wife has been helping him.    Follow Up Recommendations  Home health PT     Does the patient have the potential to tolerate intense rehabilitation     Barriers to Discharge        Equipment Recommendations  None recommended by PT (has all equipment and home O2)    Recommendations for Other Services    Frequency Min 3X/week   Plan Discharge plan remains appropriate    Precautions / Restrictions Precautions Precautions: Fall Precaution Comments: home O2 2 lts past 2 years Restrictions Weight Bearing Restrictions: No   Pertinent Vitals/Pain No c/o pain    Mobility  Bed Mobility Bed Mobility: Supine to Sit Supine to Sit: 6: Modified independent (Device/Increase time) Details for Bed Mobility Assistance: No assistance needed just increased time and use of rail Transfers Transfers: Sit to Stand;Stand to Sit Sit to Stand: 5: Supervision;4: Min guard;From toilet;From bed Stand to Sit: 5: Supervision;4: Min guard;To toilet;To chair/3-in-1 Details for Transfer Assistance: increased time slightly slow and sluggish Ambulation/Gait Ambulation/Gait Assistance: 4: Min guard;4: Min assist Ambulation Distance (Feet): 24 Feet Assistive device: Rolling walker Ambulation/Gait Assistance Details: amb to and from bathroom with RW and slightly slow, sluggish, drunken gait in which pt replys "I ain't awake yet". Gait  Pattern: Step-through pattern;Shuffle Gait velocity: decreased     PT Goals                          progressing    Visit Information  Last PT Received On: 06/08/12 Assistance Needed: +1    Subjective Data  Subjective: I'm not going to no damn nursing home Patient Stated Goal: home   Cognition    good   Balance   fair depending on level of fatigue  End of Session PT - End of Session Equipment Utilized During Treatment: Gait belt Activity Tolerance: Patient tolerated treatment well Patient left: in chair   Felecia Shelling  PTA [facility]  Acute  Rehab Pager      [phone]

## 2012-06-09 DIAGNOSIS — K651 Peritoneal abscess: Secondary | ICD-10-CM

## 2012-06-09 LAB — CBC
HCT: 28.9 % — ABNORMAL LOW (ref 39.0–52.0)
Hemoglobin: 9.4 g/dL — ABNORMAL LOW (ref 13.0–17.0)
MCH: 30.8 pg (ref 26.0–34.0)
MCHC: 32.5 g/dL (ref 30.0–36.0)
RBC: 3.05 MIL/uL — ABNORMAL LOW (ref 4.22–5.81)

## 2012-06-09 LAB — GLUCOSE, CAPILLARY: Glucose-Capillary: 78 mg/dL (ref 70–99)

## 2012-06-09 LAB — BASIC METABOLIC PANEL
BUN: 32 mg/dL — ABNORMAL HIGH (ref 6–23)
CO2: 31 mEq/L (ref 19–32)
GFR calc non Af Amer: 64 mL/min — ABNORMAL LOW (ref >90)
Glucose, Bld: 105 mg/dL — ABNORMAL HIGH (ref 70–99)
Potassium: 4.1 mEq/L (ref 3.5–5.1)
Sodium: 135 mEq/L (ref 135–145)

## 2012-06-09 LAB — PROTIME-INR: Prothrombin Time: 26.3 seconds — ABNORMAL HIGH (ref 11.6–15.2)

## 2012-06-09 MED ORDER — LEVOFLOXACIN 750 MG PO TABS: 750.0000 mg | ORAL_TABLET | Freq: Every day | ORAL | Status: DC

## 2012-06-09 MED ORDER — HYDROMORPHONE HCL 4 MG PO TABS: 4.0000 mg | ORAL_TABLET | Freq: Four times a day (QID) | ORAL | Status: DC | PRN

## 2012-06-09 MED ORDER — DM-GUAIFENESIN ER 30-600 MG PO TB12: 2.0000 | ORAL_TABLET | Freq: Two times a day (BID) | ORAL | Status: DC

## 2012-06-09 MED ORDER — VANCOMYCIN 50 MG/ML ORAL SOLUTION: 125.0000 mg | Freq: Four times a day (QID) | ORAL | Status: AC

## 2012-06-09 MED ORDER — HEPARIN SOD (PORK) LOCK FLUSH 100 UNIT/ML IV SOLN
500.0000 [IU] | INTRAVENOUS | Status: AC | PRN
  Administered 2012-06-09: 500 [IU]

## 2012-06-09 MED ORDER — WARFARIN SODIUM 1 MG PO TABS
1.5000 mg | ORAL_TABLET | Freq: Once | ORAL | Status: DC
  Filled 2012-06-09: qty 1

## 2012-06-09 MED ORDER — OXYCODONE HCL 5 MG PO TABS: 5.0000 mg | ORAL_TABLET | Freq: Four times a day (QID) | ORAL | Status: AC | PRN

## 2012-06-09 MED ORDER — PREDNISONE 50 MG PO TABS: ORAL_TABLET | ORAL | Status: DC

## 2012-06-09 MED ORDER — BUDESONIDE 0.5 MG/2ML IN SUSP: 0.5000 mg | Freq: Two times a day (BID) | RESPIRATORY_TRACT | Status: DC

## 2012-06-09 NOTE — Progress Notes (Signed)
Patient Vincent [name]      DOB: Oct 01, 1932      AVW:[id number]  Summary of Goals of Care, full note to follow:  Met with patient Son-Don, Daughter, and on phone with Sister (POA) Sisters gives future permission to take patient and children's word regarding care choices.  Patient desires to understand his continued treatment options .  He states if offered and able would take further Chemo.  Not open at this time to Hospice.  States he does not want to "go quietly into the night".  Patient with some unreasonable ideas about his ability to care for himself and his wife's ability (she has dementia).  I will contact Oncology in the am and discuss his case.  No changes to current treatment.  Will need to help family optimize services at discharge if patient not open to hospice care.    Total time:  430 pm- 515 pm  Thomasa Heidler L. Ladona Ridgel, MD MBA The Palliative Medicine Team at [facility] Phone: [phone] Pager: [phone]

## 2012-06-09 NOTE — Progress Notes (Signed)
  ANTICOAGULATION CONSULT NOTE - Follow Up Consult  Pharmacy Consult for Coumadin Indication: atrial fibrillation  No Known Allergies  Patient Measurements: Height: 5\' 11"  (180.3 cm) Weight: 180 lb 12.8 oz (82.01 kg) IBW/kg (Calculated) : 75.3   Vital Signs: Temp: 97.8 F (36.6 C) (02/06 0516) Temp src: Oral (02/06 0516) BP: 117/53 mmHg (02/06 1045) Pulse Rate: 63  (02/06 1045)  Labs:  Basename 06/09/12 0520 06/08/12 0605 06/07/12 0442  HGB 9.4* -- --  HCT 28.9* -- --  PLT 137* -- --  APTT -- -- --  LABPROT 26.3* 23.6* 22.8*  INR 2.56* 2.21* 2.11*  HEPARINUNFRC -- -- --  CREATININE 1.08 -- --  CKTOTAL -- -- --  CKMB -- -- --  TROPONINI -- -- --    Estimated Creatinine Clearance: 61 ml/min (by C-G formula based on Cr of 1.08).   Medications:  Home dose: 5 mg daily except 2.5 mg on Mon & Fri per previous med list, but the current instructions could not be verified.  Assessment: 79 year-old male on chronic coumadin for PAF admitted 1/20 with CC: confusion and weakness.  PMH significant for: atrial fibrillation, rectal CA s/p colostomy, last chemo 05/25/12, AAA without rupture (02/2012) 1/30 admission INR was supratherapeutic therefore coumadin held 3 days and pt given Vit K.  INR remains therapeutic, but slowly trending up. DDIs:  Levaquin can cause increased serum concentrations of warfarin and elevated INRs.   H/H low but stable. Plts have been declining: 263 on admit, now 137.   No bleeding reported in chart notes or per RN Will use lower warfarin dose tonight due to INR trending up and drug interaction with levaquin.  Goal of Therapy:  INR 2-3 Monitor platelets by anticoagulation protocol: Yes   Plan:  1.  Coumadin 1.5 mg po x 1 tonight 2.  F/u daily PT/INR  Darrol Angel, PharmD Pager: [phone] 06/09/2012 11:37 AM

## 2012-06-09 NOTE — Plan of Care (Signed)
Problem: Discharge Progression Outcomes Goal: O2 sats > or equal 90% or at baseline Outcome: Not Met (add Reason) Patient uses oxygen at home. Goal: Home O2 if indicated Outcome: Completed/Met Date Met:  06/09/12 Already on oxygen at home.

## 2012-06-09 NOTE — Consult Note (Signed)
Patient Vincent [name]      DOB: 12-23-1932      AVW:[id number]     Consult Note from the Palliative Medicine Team at [facility]    Consult Requested by: Dr.     PCP: Aura Dials, MD Reason for Consultation:Goals of care     Phone Number:[phone]  Assessment of patients Current state: 79 year old white male with rectal cancer admitted with shortness of breath and confusion.  Found to have sinusitis and a COPD exacerbation.  Patient desires to discharge to home and continue with chemotherapy.  Needs to talk with Dr. Clelia Croft when he returns to town regarding further treatements.     Goals of Care: 1.  Code Status: DNR already established   2. Scope of Treatment: Diontay relates that he plans on proceeding with further chemo if it is offered.  Dr. Clelia Croft has been out of town. I will try to contact him in the am to update him on Vincent [name]'s wishes.  Raza feels that he will be able to get around at home but his family is not so sure that he is strong enough.  They are going to therefore take some time off to get him settled at home.   4. Disposition: Home with maximal home health   3. Symptom Management:   1. Pain:Currently controlled on as needed oxycodone/dilaudid 2. Bowel Regimen: should be added if experiencing constipation on opiates.  LBM 2/5  3. Dyspnea: improved with agressive pulmonary toilet and steroids  4. Psychosocial:  Lives at home with spouse who has advancing dementia.  Sister is POA and has deferred to the patient and son and daughter per phone conversation with her.  5. Spiritual:  Not able to explore fully with this patient who is guarded with respect to his personal life        Patient Documents Completed or Given: Document Given Completed  Advanced Directives Pkt    MOST    DNR    Gone from My Sight    Hard Choices      Brief HPI: 79 year old white male with past medical history for rectal cancer, admitted with shortness of breath and confusion.  Patient being treated for sinus infection and COPD exacerbation.  Patient is severely debilitated but desires to continue to pursue chemotherapy options with Dr. Clelia Croft.  We were asked to elicit goals of care.   ROS: patient states he is doing well .  Agrees he is a little weak but over all denies any shortness of breath, n, v, diarrhea    PMH:  Past Medical History  Diagnosis Date  . Hypertension   . Arthritis   . Heart disease   . SOB (shortness of breath)   . Malignant neoplasm of hepatic flexure   . Malignant neoplasm of rectosigmoid junction   . Cancer     and/or colon polyp  . Malignant neoplasm of hepatic flexure   . Malignant neoplasm of rectosigmoid junction   . Diabetes mellitus   . Atrial fibrillation   . Colostomy care   . AAA (abdominal aortic aneurysm) without rupture 03/02/2012  . COPD (chronic obstructive pulmonary disease)   . Tobacco abuse   . Diabetic neuropathy   . Acute kidney injury 06/02/2012     PSH: Past Surgical History  Procedure Date  . Back surgery 1982-1983    ruptured disk  . Arthroscopic knee 1992  . 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 Exploratory laparotomy 02/2010  .  Rectosigmoid colon resection 02/2010  . Colostomy 02/2010  . Drainage of intra-abdominal abscess 02/2010  . Flexible sigmoidoscopy 12/18/2011    Procedure: FLEXIBLE SIGMOIDOSCOPY;  Surgeon: Beverley Fiedler, MD;  Location: [facility] ENDOSCOPY;  Service: Gastroenterology;  Laterality: N/A;  . Colon surgery 12/2011  . Cosmetic surgery   . Laparotomy 03/05/2012    Procedure: EXPLORATORY LAPAROTOMY;  Surgeon: Lodema Pilot, DO;  Location: [facility] ORS;  Service: General;  Laterality: N/A;  . Laparoscopic lysis of adhesions 03/05/2012    Procedure: LAPAROSCOPIC LYSIS OF ADHESIONS;  Surgeon: Lodema Pilot, DO;  Location: [facility] ORS;  Service: General;  Laterality: N/A;  . Back surgery 1970s    ruptured disk   I have reviewed the FH and SH and  If appropriate update it with new information. No  Known Allergies Scheduled Meds:   . antiseptic oral rinse  15 mL Mouth Rinse q12n4p  . arformoterol  15 mcg Nebulization BID  . aspirin EC  81 mg Oral Daily  . bisoprolol  5 mg Oral Daily  . budesonide (PULMICORT) nebulizer solution  0.5 mg Nebulization BID  . chlorhexidine  15 mL Mouth Rinse BID  . dextromethorphan-guaiFENesin  2 tablet Oral BID  . digoxin  0.125 mg Oral Daily  . fluticasone  2 spray Each Nare Daily  . insulin aspart  0-20 Units Subcutaneous TID WC  . insulin aspart  0-5 Units Subcutaneous QHS  . levofloxacin  750 mg Oral Q2000  . pantoprazole  40 mg Oral BID AC  . predniSONE  60 mg Oral Q breakfast  . simvastatin  40 mg Oral QHS  . sodium chloride  1,000 mL Intravenous Once  . sodium chloride  3 mL Intravenous Q12H  . vancomycin  125 mg Oral Q6H  . Warfarin - Pharmacist Dosing Inpatient   Does not apply q1800   Continuous Infusions:   . sodium chloride 20 mL/hr (06/08/12 1040)   PRN Meds:.acetaminophen, acetaminophen, albuterol, ipratropium, morphine CONCENTRATE, ondansetron (ZOFRAN) IV, ondansetron, sodium chloride, sodium chloride    BP 112/63  Pulse 63  Temp 97.8 F (36.6 C) (Oral)  Resp 18  Ht 5\' 11"  (1.803 m)  Wt 82.01 kg (180 lb 12.8 oz)  BMI 25.22 kg/m2  SpO2 100%   PPS: 40/%   Intake/Output Summary (Last 24 hours) at 06/09/12 0700 Last data filed at 06/09/12 1610  Gross per 24 hour  Intake    854 ml  Output   1950 ml  Net  -1096 ml   LBM: 2/5                    Stool Softner: monitor  Physical Exam:  General: weak but in no acute distress, able to complete sentances HEENT:  PERRL, EOMI, anicteric, mmm, no oral lesions Chest:   Decreased with distant breath sounds, no wheezing at present CVS: regular, rate and rhythm, S1, S2, Abdomen: soft, scaphoid, not tender, not distended Ext: warm, no mottling,  Neuro: awake , alert oriented , judgement and insight may be slightly off but he does have capacity for decision  making  Labs: CBC    Component Value Date/Time   WBC 3.1* 06/09/2012 0520   WBC 9.6 05/25/2012 1110   RBC 3.05* 06/09/2012 0520   RBC 3.39* 05/25/2012 1110   HGB 9.4* 06/09/2012 0520   HGB 10.7* 05/25/2012 1110   HCT 28.9* 06/09/2012 0520   HCT 32.2* 05/25/2012 1110   PLT 137* 06/09/2012 0520   PLT 237 05/25/2012 1110  MCV 94.8 06/09/2012 0520   MCV 94.8 05/25/2012 1110   MCH 30.8 06/09/2012 0520   MCH 31.6 05/25/2012 1110   MCHC 32.5 06/09/2012 0520   MCHC 33.3 05/25/2012 1110   RDW 16.8* 06/09/2012 0520   RDW 17.8* 05/25/2012 1110   LYMPHSABS 0.8 06/02/2012 1120   LYMPHSABS 0.4* 05/25/2012 1110   MONOABS 0.3 06/02/2012 1120   MONOABS 0.9 05/25/2012 1110   EOSABS 0.0 06/02/2012 1120   EOSABS 0.1 05/25/2012 1110   BASOSABS 0.0 06/02/2012 1120   BASOSABS 0.0 05/25/2012 1110      CMP     Component Value Date/Time   NA 135 06/09/2012 0520   NA 136 05/25/2012 1110   NA 133 04/09/2011 0838   K 4.1 06/09/2012 0520   K 3.5 05/25/2012 1110   K 4.6 04/09/2011 0838   CL 100 06/09/2012 0520   CL 100 05/25/2012 1110   CL 98 04/09/2011 0838   CO2 31 06/09/2012 0520   CO2 27 05/25/2012 1110   CO2 31 04/09/2011 0838   GLUCOSE 105* 06/09/2012 0520   GLUCOSE 124* 05/25/2012 1110   GLUCOSE 136* 04/09/2011 0838   BUN 32* 06/09/2012 0520   BUN 26.0 05/25/2012 1110   BUN 18 04/09/2011 0838   CREATININE 1.08 06/09/2012 0520   CREATININE 1.2 05/25/2012 1110   CREATININE 1.3* 04/09/2011 0838   CALCIUM 8.3* 06/09/2012 0520   CALCIUM 9.6 05/25/2012 1110   CALCIUM 8.8 04/09/2011 0838   PROT 5.6* 06/03/2012 0540   PROT 6.9 05/25/2012 1110   PROT 6.5 04/09/2011 0838   ALBUMIN 2.2* 06/03/2012 0540   ALBUMIN 2.7* 05/25/2012 1110   AST 18 06/03/2012 0540   AST 12 05/25/2012 1110   AST 18 04/09/2011 0838   ALT 14 06/03/2012 0540   ALT 10 05/25/2012 1110   ALKPHOS 65 06/03/2012 0540   ALKPHOS 82 05/25/2012 1110   ALKPHOS 67 04/09/2011 0838   BILITOT 0.2* 06/03/2012 0540   BILITOT 0.76 05/25/2012 1110   BILITOT 0.70 04/09/2011 0838   GFRNONAA 64*  06/09/2012 0520   GFRAA 74* 06/09/2012 0520    Chest Xray Reviewed/Impressions: No active cardiopulmonary disease  CT scan of the abd Reviewed/Impressions: 1. Suspected continued slow growth of complex pelvic mass  involving the rectum and prostate gland most consistent with  recurrent tumor. Correlate clinically.  2. No distant metastases identified.  3. Resolved small bowel obstruction.   MRI with sinusitis multiple air fluid levels.  Time In Time Out Total Time Spent with Patient Total Overall Time  430 pm 515 pm 45 min 45 min    Greater than 50%  of this time was spent counseling and coordinating care related to the above assessment and plan.  Carrick Rijos L. Ladona Ridgel, MD MBA The Palliative Medicine Team at [facility] Phone: [phone] Pager: [phone]

## 2012-06-09 NOTE — Progress Notes (Signed)
Patient discharged home with wife. Discharge instructions given and explained to patient/family and they verbalized understanding. Patient denies any pain. Skin intact with bruises on BUE. Son at the bedside during discharge. Accompanied home by wife/friend.

## 2012-06-09 NOTE — Discharge Summary (Signed)
Physician Discharge Summary  Vincent [name] OZH:[id number] DOB: 11-17-1932 DOA: 06/02/2012  PCP: Aura Dials, MD  Admit date: 06/02/2012 Discharge date: 06/09/2012  Recommendations for Outpatient Follow-up:  1. Pt will need to follow up with PCP in 2-3 weeks post discharge 2. Please obtain BMP to evaluate electrolytes and kidney function 3. Please also check CBC to evaluate Hg and Hct levels 4. Please note that pt was discharged on Levaquin and prednisone taper pack 5. Pt also discharge on Vancomycin oral solution to complete the therapy for C. DIff  Discharge Diagnoses:  *Acute respiratory failure with hypercapnia secondary to COPD Principal Problem:  *Acute respiratory failure with hypercapnia Active Problems:  Abdominal pain  Thrombocytopenia  Hyponatremia  Weakness generalized  Anemia  PAF (paroxysmal atrial fibrillation)  DM (diabetes mellitus)  HTN (hypertension)  Chronic diastolic CHF (congestive heart failure)  C. difficile diarrhea  Dehydration  Acute kidney injury  Anticoagulation excessive  Tobacco abuse  COPD (chronic obstructive pulmonary disease)  Sinusitis  Discharge Condition: Stable  Diet recommendation: Heart healthy diet discussed in details   Brief narrative:  The patient is a 79 yo M with hx of rectal cancer s/p colostomy and recurrent SBO, currently on chemotherapy, severe COPD, oxygen dependent 2 L, continuous flow who presented with several days of N/V/D, severe dehydration, and progressively worsening shortness of breath that initially started 6 weeks prior to this admission while patient was recovering from recurrent colon cancer and undergoing chemotherapy.Marland Kitchen He was hydrated, transitioned back to nasal canula. He was c. Diff positive and was started on treatment. Required an admission 01/31. At baseline, patient uses Combivent nebulizers as maintenance treatment, did not feel Spiriva helped.   Acute on chronic respiratory failure with hypercapnea:   - Secondary to COPD exacerbation - Patient is clinically improving and maintaining oxygen saturations at target range on nasal cannula  - Patient is on oxygen at home, 2 L continuous flow and will continue to use oxygen at home upon discharge  - Continue budesonide, duonebs prn  Bilateral ethmoid sinusitis  - Levofloxacin x 5 more days post discharge  - Continued nasal saline, flonase, afrin  C. Diff Diarrhea, mild, with more formed stools  - Patient was transition from Flagyl to oral vancomycin due to difficulty adjusting INR  - stool culture neg  - diarrhea resolved - pt will be discharged on oral Vancomycin oral solution  Slurred speech:  - Now resolved  - MRI of the brain negative  PAF (paroxysmal atrial fibrillation) with RVR in ER that responded to IVF.  - Rate controlled, no events on telemetry over 72 hours - Continue bisoprolol  - Continue digoxin  - Electrolytes have been stable  DM (diabetes mellitus)  - Continued high dose SSI and control of CBG reasonable while inpatient  HTN (hypertension)  - blood pressure stable  Chronic diastolic CHF (congestive heart failure), grade 1:  - Monitor carefully for evidence of hypervolemia, currently appears euvolemic  Rectal cancer:  Acute kidney failure  - likely prerenal and some ATN from dehydration and hypotesion, trended down with hydration.  - D/c'd labs as patient stated he would just like to be comfortable.  Weakness generalized:  - PT/OT recommending home health PT/OT   Code Status: DNR/DNI   Consultants:  Pulmonology  Palliative care  Oncology Antibiotics:  Levofloxacin 1/30 >> Duration of 3 weeks for chronic sinusitis  Flagyl 1/31 >> 2/4  Vanco oral 2/4 >> Continue for an additional 1-2 weeks post completion of levofloxacin therapy  Procedures/Studies:  Dg Eye Foreign Body  06/07/2012  Negative for radiopaque orbital foreign body. Metal in the right supraorbital scalp. The patient is cleared for MRI.  Mr Brain  Wo Contrast  06/07/2012  No acute intracranial abnormality. Sinusitis with multiple air-fluid levels.  Ct Maxillofacial Ltd Wo Cm  06/07/2012  Air fluid levels in the maxillary sinuses bilaterally. Moderate mucosal thickening / opacification of the ethmoid sinus air cells bilaterally. Air fluid levels within the sphenoid sinus air cells bilaterally. Left sphenoid sinus air cell is dominant. The sphenoid septum is directed towards the left internal carotid artery. Visualized frontal sinuses are clear   Discharge Exam: Filed Vitals:   06/09/12 1417  BP: 136/47  Pulse: 81  Temp: 98.2 F (36.8 C)  Resp: 18   Filed Vitals:   06/09/12 0746 06/09/12 1045 06/09/12 1417 06/09/12 1428  BP:  117/53 136/47   Pulse:  63 81   Temp:   98.2 F (36.8 C)   TempSrc:   Oral   Resp:   18   Height:      Weight:      SpO2: 96%  96% 89%    General: Pt is alert, follows commands appropriately, not in acute distress Cardiovascular: Regular rate and rhythm, S1/S2 +, no murmurs, no rubs, no gallops Respiratory: Clear to auscultation bilaterally, no wheezing, decreased breath sounds at bases Abdominal: Soft, non tender, non distended, bowel sounds +, no guarding Extremities: no edema, no cyanosis, pulses palpable bilaterally DP and PT Neuro: Grossly nonfocal  Discharge Instructions  Discharge Orders    Future Appointments: Provider: Department: Dept Phone: Center:   06/10/2012 11:00 AM [facility] [phone] None     Future Orders Please Complete By Expires   Diet - low sodium heart healthy      Increase activity slowly          Medication List     As of 06/09/2012  3:11 PM    STOP taking these medications         albuterol 108 (90 BASE) MCG/ACT inhaler   Commonly known as: PROVENTIL HFA;VENTOLIN HFA      TAKE these medications         albuterol-ipratropium 18-103 MCG/ACT inhaler   Commonly known as: COMBIVENT   Inhale 1 puff into the  lungs every 4 (four) hours as needed. WHEEZING      aspirin EC 81 MG tablet   Take 81 mg by mouth daily.      budesonide 0.5 MG/2ML nebulizer solution   Commonly known as: PULMICORT   Take 2 mLs (0.5 mg total) by nebulization 2 (two) times daily.      carvedilol 3.125 MG tablet   Commonly known as: COREG   Take 3.125 mg by mouth 2 (two) times daily with a meal.      dextromethorphan-guaiFENesin 30-600 MG per 12 hr tablet   Commonly known as: MUCINEX DM   Take 2 tablets by mouth 2 (two) times daily.      digoxin 0.125 MG tablet   Commonly known as: LANOXIN   Take 0.125 mg by mouth daily.      Fluticasone-Salmeterol 500-50 MCG/DOSE Aepb   Commonly known as: ADVAIR   Inhale 1 puff into the lungs every 12 (twelve) hours.      HYDROmorphone 4 MG tablet   Commonly known as: DILAUDID   Take 1 tablet (4 mg total) by mouth every 6 (six) hours as needed. Pain  levofloxacin 750 MG tablet   Commonly known as: LEVAQUIN   Take 1 tablet (750 mg total) by mouth daily at 8 pm.      lisinopril 20 MG tablet   Commonly known as: PRINIVIL,ZESTRIL   Take 20 mg by mouth daily.      Melatonin 3 MG Caps   Take 1 capsule by mouth at bedtime.      metFORMIN 500 MG tablet   Commonly known as: GLUCOPHAGE   Take 500 mg by mouth daily with breakfast.      nitroGLYCERIN 0.3 MG SL tablet   Commonly known as: NITROSTAT   Place 0.3 mg under the tongue every 5 (five) minutes as needed. For chest pain.      ondansetron 4 MG tablet   Commonly known as: ZOFRAN   Take 1 tablet (4 mg total) by mouth every 6 (six) hours as needed for nausea.      oxyCODONE 5 MG immediate release tablet   Commonly known as: Oxy IR/ROXICODONE   Take 1 tablet (5 mg total) by mouth every 6 (six) hours as needed. Breakthrough pain      pantoprazole 40 MG tablet   Commonly known as: PROTONIX   Take 40 mg by mouth daily.      predniSONE 50 MG tablet   Commonly known as: DELTASONE   Take 50 mg tablet today and taper  down by 10 mg daily until completed      PRESCRIPTION MEDICATION   Inject into the vein every 14 (fourteen) days. Adrucil, Leucovorin, and Camptosar infusion every 2 weeks.      simvastatin 40 MG tablet   Commonly known as: ZOCOR   Take 1 tablet (40 mg total) by mouth at bedtime.      temazepam 15 MG capsule   Commonly known as: RESTORIL   Take 15 mg by mouth at bedtime as needed. For sleep.      vancomycin 50 mg/mL oral solution   Commonly known as: VANCOCIN   Take 2.5 mLs (125 mg total) by mouth every 6 (six) hours.      warfarin 5 MG tablet   Commonly known as: COUMADIN   Take 5 mg by mouth.           Follow-up Information    Follow up with BOUSKA,DAVID E, MD. In 2 weeks.   Contact information:   [address] [place] [address] [phone]           The results of significant diagnostics from this hospitalization (including imaging, microbiology, ancillary and laboratory) are listed below for reference.     Microbiology: Recent Results (from the past 240 hour(s))  URINE CULTURE     Status: Normal   Collection Time   06/02/12 11:40 AM      Component Value Range Status Comment   Specimen Description URINE, CATHETERIZED   Final    Special Requests NONE   Final    Culture  Setup Time 06/02/2012 16:04   Final    Colony Count NO GROWTH   Final    Culture NO GROWTH   Final    Report Status 06/03/2012 FINAL   Final   MRSA PCR SCREENING     Status: Normal   Collection Time   06/02/12  4:12 PM      Component Value Range Status Comment   MRSA by PCR NEGATIVE  NEGATIVE Final   STOOL CULTURE     Status: Normal   Collection Time  06/03/12  9:30 AM      Component Value Range Status Comment   Specimen Description STOOL   Final    Special Requests Normal   Final    Culture     Final    Value: NO SALMONELLA, SHIGELLA, CAMPYLOBACTER, YERSINIA, OR E.COLI 0157:H7 ISOLATED   Report Status 06/06/2012 FINAL   Final   CLOSTRIDIUM DIFFICILE BY PCR     Status:  Abnormal   Collection Time   06/03/12  9:30 AM      Component Value Range Status Comment   C difficile by pcr POSITIVE (*) NEGATIVE Final      Labs: Basic Metabolic Panel:  Lab 06/09/12 1610 06/06/12 0640 06/05/12 0500 06/04/12 0515 06/03/12 0540  NA 135 136 134* 134* 134*  K 4.1 4.5 4.5 4.8 4.7  CL 100 104 105 104 104  CO2 31 24 23 23 23   GLUCOSE 105* 269* 164* 165* 144*  BUN 32* 43* 46* 42* 40*  CREATININE 1.08 1.24 1.46* 1.35 1.37*  CALCIUM 8.3* 8.7 9.0 8.7 8.6  MG -- -- 1.9 1.9 --  PHOS -- -- -- -- --   Liver Function Tests:  Lab 06/03/12 0540  AST 18  ALT 14  ALKPHOS 65  BILITOT 0.2*  PROT 5.6*  ALBUMIN 2.2*   CBC:  Lab 06/09/12 0520 06/06/12 0640 06/05/12 0500 06/04/12 0515 06/03/12 0930  WBC 3.1* 4.3 6.5 5.3 6.4  NEUTROABS -- -- -- -- --  HGB 9.4* 9.1* 9.4* 9.3* 10.0*  HCT 28.9* 28.7* 29.2* 29.8* 31.3*  MCV 94.8 95.7 95.7 95.8 95.4  PLT 137* 123* 138* 138* 144*   Cardiac Enzymes:  Lab 06/03/12 0540 06/02/12 2350 06/02/12 1744  CKTOTAL -- -- --  CKMB -- -- --  CKMBINDEX -- -- --  TROPONINI <0.30 <0.30 <0.30   BNP: BNP (last 3 results)  Basename 06/04/12 0515 06/02/12 1131 03/13/12 0515  PROBNP 3452.0* 2025.0* 2539.0*   CBG:  Lab 06/09/12 1152 06/09/12 0731 06/08/12 2251 06/08/12 1658 06/08/12 1155  GLUCAP 162* 78 193* 160* 127*     SIGNED: Time coordinating discharge: Over 30 minutes  Debbora Presto, MD  [facility] 06/09/2012, 3:11 PM Pager [phone]  If 7PM-7AM, please contact night-coverage [email or web address] Password [email or web address]

## 2012-06-14 ENCOUNTER — Other Ambulatory Visit: Payer: Self-pay | Admitting: Oncology

## 2012-06-14 ENCOUNTER — Telehealth: Payer: Self-pay | Admitting: *Deleted

## 2012-06-14 DIAGNOSIS — C20 Malignant neoplasm of rectum: Secondary | ICD-10-CM

## 2012-06-14 NOTE — Telephone Encounter (Signed)
Patient calling to say he was in the hospital last week and missed his last appt, for lab,dr, and chemo. Per dr Clelia Croft. pof done and he should be scheduled on 06/16/12. A scheduler will be calling him with an appt time. Patient verbalized understanding.

## 2012-06-16 ENCOUNTER — Telehealth: Payer: Self-pay | Admitting: Oncology

## 2012-06-21 ENCOUNTER — Telehealth: Payer: Self-pay | Admitting: Oncology

## 2012-06-21 ENCOUNTER — Telehealth: Payer: Self-pay | Admitting: *Deleted

## 2012-06-21 NOTE — Telephone Encounter (Signed)
Spoke with patient, gave date and time for appt  06/24/12 @ 3:00 for lab and 3:30 dr Clelia Croft., patient wrote information down.

## 2012-06-21 NOTE — Telephone Encounter (Signed)
Added appt for 2/21 per desk nurse. Per desk nurse she will call pt re appt.

## 2012-06-24 ENCOUNTER — Other Ambulatory Visit (HOSPITAL_BASED_OUTPATIENT_CLINIC_OR_DEPARTMENT_OTHER): Payer: Medicare Other

## 2012-06-24 ENCOUNTER — Telehealth: Payer: Self-pay | Admitting: Oncology

## 2012-06-24 ENCOUNTER — Ambulatory Visit (HOSPITAL_BASED_OUTPATIENT_CLINIC_OR_DEPARTMENT_OTHER): Payer: Medicare Other | Admitting: Oncology

## 2012-06-24 VITALS — BP 118/69 | HR 82 | Temp 98.0°F | Resp 20 | Ht 71.0 in | Wt 187.2 lb

## 2012-06-24 DIAGNOSIS — C20 Malignant neoplasm of rectum: Secondary | ICD-10-CM

## 2012-06-24 DIAGNOSIS — D649 Anemia, unspecified: Secondary | ICD-10-CM

## 2012-06-24 DIAGNOSIS — I4891 Unspecified atrial fibrillation: Secondary | ICD-10-CM

## 2012-06-24 DIAGNOSIS — G609 Hereditary and idiopathic neuropathy, unspecified: Secondary | ICD-10-CM

## 2012-06-24 LAB — COMPREHENSIVE METABOLIC PANEL (CC13)
ALT: 17 U/L (ref 0–55)
CO2: 33 mEq/L — ABNORMAL HIGH (ref 22–29)
Calcium: 9 mg/dL (ref 8.4–10.4)
Chloride: 103 mEq/L (ref 98–107)
Potassium: 4.8 mEq/L (ref 3.5–5.1)
Sodium: 142 mEq/L (ref 136–145)
Total Protein: 6 g/dL — ABNORMAL LOW (ref 6.4–8.3)

## 2012-06-24 LAB — CBC WITH DIFFERENTIAL/PLATELET
BASO%: 0.7 % (ref 0.0–2.0)
HCT: 29.7 % — ABNORMAL LOW (ref 38.4–49.9)
MCHC: 33.5 g/dL (ref 32.0–36.0)
MONO#: 0.6 10*3/uL (ref 0.1–0.9)
NEUT%: 75.2 % — ABNORMAL HIGH (ref 39.0–75.0)
RBC: 3.18 10*6/uL — ABNORMAL LOW (ref 4.20–5.82)
RDW: 19.4 % — ABNORMAL HIGH (ref 11.0–14.6)
WBC: 5.6 10*3/uL (ref 4.0–10.3)
lymph#: 0.7 10*3/uL — ABNORMAL LOW (ref 0.9–3.3)

## 2012-06-24 NOTE — Progress Notes (Signed)
Hematology and Oncology Follow Up Visit  Vincent [name] [id number] [birth year] 79 y.o. 06/24/2012 4:00 PM  CC: Velora Heckler, MD  Tracey Harries, M.D.  Iva Boop, MD,FACG  Billie Lade, Ph.D., M.D.    Principle Diagnosis: This is a 79 year old gentleman diagnosed with adenocarcinoma of the rectum.  He had T4a N2 disease diagnosed in 2011.  He presented obstruction of the distal rectal area. Now has local relapse.    Prior Therapy: 1. Status post rectosigmoid resection and segmental resection on February 13, 2010.  Tumor was poorly differentiated adenocarcinoma, 8 out of 16 lymph nodes involved, tumor not completely resected at that time.  2. The patient received radiation therapy adamantly concomitantly with Xeloda.  Therapy concluded in January 2012.  3. Treated with adjuvant FOLFOX for a total of 7 cycles of therapy.  The therapy concluded Sep 16, 2010.  Current therapy: FOLFIRI for salvage purposes. He started FOLFIRI on 9/25. He is here for evaluation before cycle cycle 8.   Interim History:  Vincent [name] presents today for an office followup visit. He has been doing well on chemotherapy. He was hospitalized recently between 1/30 to 2/6 for COPD and CHF exacerbation as well as respiratory failure. He is feeling better since his discharge. He has not had any abdominal pain. No nausea or vomiting. No problems with constipation.  Continues to have peripheral neuropathy that is unchanged. He denies any headaches, any visual changes, any dysphagia, odynophagia, any nausea, vomiting, diarrhea, constipation, chest pain, shortness of breath, productive cough, abdominal pain, abdominal swelling, lower extremity paresthesia, bowel or bladder incontinence, any obvious bleeding such as melena, hematochezia, or hematuria.  He denies any fevers, chills, or night sweats. Appetite is improving at this time. No new complications from chemotherapy.   Medications: I have reviewed the patient's current  medications. Current outpatient prescriptions:albuterol-ipratropium (COMBIVENT) 18-103 MCG/ACT inhaler, Inhale 1 puff into the lungs every 4 (four) hours as needed. WHEEZING, Disp: , Rfl: ;  aspirin EC 81 MG tablet, Take 81 mg by mouth daily., Disp: , Rfl: ;  budesonide (PULMICORT) 0.5 MG/2ML nebulizer solution, Take 2 mLs (0.5 mg total) by nebulization 2 (two) times daily., Disp: 2 mL, Rfl: 3 carvedilol (COREG) 3.125 MG tablet, Take 3.125 mg by mouth 2 (two) times daily with a meal., Disp: , Rfl: ;  dextromethorphan-guaiFENesin (MUCINEX DM) 30-600 MG per 12 hr tablet, Take 2 tablets by mouth 2 (two) times daily., Disp: 45 tablet, Rfl: 0;  digoxin (LANOXIN) 0.125 MG tablet, Take 0.125 mg by mouth daily., Disp: , Rfl:  Fluticasone-Salmeterol (ADVAIR DISKUS) 500-50 MCG/DOSE AEPB, Inhale 1 puff into the lungs every 12 (twelve) hours., Disp: 60 each, Rfl: 3;  HYDROmorphone (DILAUDID) 4 MG tablet, Take 1 tablet (4 mg total) by mouth every 6 (six) hours as needed. Pain, Disp: 45 tablet, Rfl: 0;  levofloxacin (LEVAQUIN) 750 MG tablet, Take 1 tablet (750 mg total) by mouth daily at 8 pm., Disp: 5 tablet, Rfl: 0 lisinopril (PRINIVIL,ZESTRIL) 20 MG tablet, Take 20 mg by mouth daily., Disp: , Rfl: ;  Melatonin 3 MG CAPS, Take 1 capsule by mouth at bedtime., Disp: , Rfl: ;  metFORMIN (GLUCOPHAGE) 500 MG tablet, Take 500 mg by mouth daily with breakfast. , Disp: , Rfl: ;  nitroGLYCERIN (NITROSTAT) 0.3 MG SL tablet, Place 0.3 mg under the tongue every 5 (five) minutes as needed. For chest pain., Disp: , Rfl:  ondansetron (ZOFRAN) 4 MG tablet, Take 1 tablet (4 mg total) by mouth every  6 (six) hours as needed for nausea., Disp: 20 tablet, Rfl: 0;  oxyCODONE (OXY IR/ROXICODONE) 5 MG immediate release tablet, Take 1 tablet (5 mg total) by mouth every 6 (six) hours as needed. Breakthrough pain, Disp: 30 tablet, Rfl: 0;  pantoprazole (PROTONIX) 40 MG tablet, Take 40 mg by mouth daily., Disp: , Rfl:  predniSONE (DELTASONE) 50 MG  tablet, Take 50 mg tablet today and taper down by 10 mg daily until completed, Disp: 15 tablet, Rfl: 0;  PRESCRIPTION MEDICATION, Inject into the vein every 14 (fourteen) days. Adrucil, Leucovorin, and Camptosar infusion every 2 weeks., Disp: , Rfl: ;  simvastatin (ZOCOR) 40 MG tablet, Take 1 tablet (40 mg total) by mouth at bedtime., Disp: 30 tablet, Rfl: 0 temazepam (RESTORIL) 15 MG capsule, Take 15 mg by mouth at bedtime as needed. For sleep., Disp: , Rfl: ;  warfarin (COUMADIN) 5 MG tablet, Take 5 mg by mouth., Disp: , Rfl:   Allergies: No Known Allergies  Past Medical History, Surgical history, Social history, and Family History were reviewed and updated.  Review of Systems: Constitutional:  Negative for fever, chills, night sweats, anorexia, weight loss, pain. Cardiovascular: no chest pain or dyspnea on exertion Respiratory: no cough, shortness of breath, or wheezing Neurological: no TIA or stroke symptoms Dermatological: negative ENT: negative Skin: Negative. Gastrointestinal: no abdominal pain, change in bowel habits, or black or bloody stools Genito-Urinary: no dysuria, trouble voiding, or hematuria Hematological and Lymphatic: negative Breast: negative Musculoskeletal: negative Remaining ROS negative.  Physical Exam: Blood pressure 118/69, pulse 82, temperature 98 F (36.7 C), temperature source Oral, resp. rate 20, height 5\' 11"  (1.803 m), weight 187 lb 3.2 oz (84.913 kg). ECOG: 2 General appearance: alert Head: Normocephalic, without obvious abnormality, atraumatic Neck: no adenopathy, no carotid bruit, no JVD, supple, symmetrical, trachea midline and thyroid not enlarged, symmetric, no tenderness/mass/nodules Lymph nodes: Cervical, supraclavicular, and axillary nodes normal. Heart:regular rate and rhythm, S1, S2 normal, no murmur, click, rub or gallop Lung:chest clear, no wheezing, rales, normal symmetric air entry Abdomen: soft, non-tender, without masses or  organomegaly EXT:no erythema, induration, or nodules   Lab Results: Lab Results  Component Value Date   WBC 5.6 06/24/2012   HGB 10.0* 06/24/2012   HCT 29.7* 06/24/2012   MCV 93.5 06/24/2012   PLT 168 06/24/2012    Impression and Plan: This is a pleasant 79 year old gentleman with the following issues:   1. Advanced rectal carcinoma, presented with a T4 N2 disease.  He presented with a complete obstruction.  After segmental resection, he underwent radiation therapy with Xeloda and subsequently systemic chemotherapy.  At this time, he has  evidence of  recurrent disease in the pelvic area. After 5 cycles of chemotherapy, his disease is rather stable based on the CT scan.   He is ready to proceed with cycle 8 of chemotherapy. This has been delayed due to his recent hospitalizations. We will proceed with the next cycle of chemotherapy next week. I will repeat CT scan after short rest period in 08/2012.  2. Anemia. Likely multi-factorial. He has no active bleeding. No transfusion is indicated. He remains on ferrous sulfate. 3. Constipation: Improved at this time. 4. Protein calorie malnutrition: Appetite is stable.   5. Port-A-Cath management. This will be used for chemotherapy.   6. Atrial fibrillation. Anticoagulated with Coumadin. 7. Hypertension.  Normotensive without medication at this time. 8. DM. On Metformin. 9. Followup: in 08/2012 after a scan.    [facility] 2/21/20144:00 PM

## 2012-06-25 ENCOUNTER — Telehealth: Payer: Self-pay | Admitting: *Deleted

## 2012-06-25 LAB — CEA: CEA: 7.1 ng/mL — ABNORMAL HIGH (ref 0.0–5.0)

## 2012-06-25 NOTE — Telephone Encounter (Signed)
Per staff message and POF I have scheduled appts.  JMW  

## 2012-06-28 ENCOUNTER — Ambulatory Visit (HOSPITAL_BASED_OUTPATIENT_CLINIC_OR_DEPARTMENT_OTHER): Payer: Medicare Other

## 2012-06-28 ENCOUNTER — Other Ambulatory Visit (HOSPITAL_BASED_OUTPATIENT_CLINIC_OR_DEPARTMENT_OTHER): Payer: Medicare Other | Admitting: Lab

## 2012-06-28 VITALS — BP 151/57 | HR 65 | Temp 97.6°F | Resp 18

## 2012-06-28 DIAGNOSIS — C20 Malignant neoplasm of rectum: Secondary | ICD-10-CM

## 2012-06-28 DIAGNOSIS — Z5111 Encounter for antineoplastic chemotherapy: Secondary | ICD-10-CM

## 2012-06-28 LAB — CBC WITH DIFFERENTIAL/PLATELET
BASO%: 0.7 % (ref 0.0–2.0)
EOS%: 2.2 % (ref 0.0–7.0)
MCH: 31.2 pg (ref 27.2–33.4)
MCHC: 33.1 g/dL (ref 32.0–36.0)
MCV: 94.3 fL (ref 79.3–98.0)
MONO%: 13.4 % (ref 0.0–14.0)
NEUT%: 73.7 % (ref 39.0–75.0)
RDW: 20.2 % — ABNORMAL HIGH (ref 11.0–14.6)
lymph#: 0.5 10*3/uL — ABNORMAL LOW (ref 0.9–3.3)

## 2012-06-28 LAB — COMPREHENSIVE METABOLIC PANEL (CC13)
ALT: 17 U/L (ref 0–55)
AST: 18 U/L (ref 5–34)
Alkaline Phosphatase: 83 U/L (ref 40–150)
Calcium: 9.4 mg/dL (ref 8.4–10.4)
Chloride: 101 mEq/L (ref 98–107)
Creatinine: 1.2 mg/dL (ref 0.7–1.3)
Potassium: 4.9 mEq/L (ref 3.5–5.1)

## 2012-06-28 MED ORDER — ONDANSETRON 16 MG/50ML IVPB (CHCC)
16.0000 mg | Freq: Once | INTRAVENOUS | Status: AC
  Administered 2012-06-28: 16 mg via INTRAVENOUS

## 2012-06-28 MED ORDER — HEPARIN SOD (PORK) LOCK FLUSH 100 UNIT/ML IV SOLN
500.0000 [IU] | Freq: Once | INTRAVENOUS | Status: DC | PRN
  Filled 2012-06-28: qty 5

## 2012-06-28 MED ORDER — SODIUM CHLORIDE 0.9 % IJ SOLN
10.0000 mL | INTRAMUSCULAR | Status: DC | PRN
  Filled 2012-06-28: qty 10

## 2012-06-28 MED ORDER — SODIUM CHLORIDE 0.9 % IV SOLN
Freq: Once | INTRAVENOUS | Status: AC
  Administered 2012-06-28: 11:00:00 via INTRAVENOUS

## 2012-06-28 MED ORDER — IRINOTECAN HCL CHEMO INJECTION 100 MG/5ML
135.0000 mg/m2 | Freq: Once | INTRAVENOUS | Status: AC
  Administered 2012-06-28: 284 mg via INTRAVENOUS
  Filled 2012-06-28: qty 14.2

## 2012-06-28 MED ORDER — FLUOROURACIL CHEMO INJECTION 2.5 GM/50ML
300.0000 mg/m2 | Freq: Once | INTRAVENOUS | Status: AC
  Administered 2012-06-28: 650 mg via INTRAVENOUS
  Filled 2012-06-28: qty 13

## 2012-06-28 MED ORDER — SODIUM CHLORIDE 0.9 % IV SOLN
1800.0000 mg/m2 | INTRAVENOUS | Status: DC
  Administered 2012-06-28: 3800 mg via INTRAVENOUS
  Filled 2012-06-28: qty 76

## 2012-06-28 MED ORDER — LEUCOVORIN CALCIUM INJECTION 350 MG
300.0000 mg/m2 | Freq: Once | INTRAVENOUS | Status: AC
  Administered 2012-06-28: 634 mg via INTRAVENOUS
  Filled 2012-06-28: qty 31.7

## 2012-06-28 MED ORDER — ATROPINE SULFATE 1 MG/ML IJ SOLN: 0.5000 mg | Freq: Once | INTRAMUSCULAR | Status: DC | PRN

## 2012-06-28 MED ORDER — DEXAMETHASONE SODIUM PHOSPHATE 4 MG/ML IJ SOLN
20.0000 mg | Freq: Once | INTRAMUSCULAR | Status: AC
  Administered 2012-06-28: 20 mg via INTRAVENOUS

## 2012-06-28 NOTE — Patient Instructions (Signed)
[facility] Discharge Instructions for Patients Receiving Chemotherapy  Today you received the following chemotherapy agents Camptosar, Leucovorin and 5 FU.  To help prevent nausea and vomiting after your treatment, we encourage you to take your nausea medication as prescribed.    If you develop nausea and vomiting that is not controlled by your nausea medication, call the clinic. If it is after clinic hours your family physician or the after hours number for the clinic or go to the Emergency Department.   BELOW ARE SYMPTOMS THAT SHOULD BE REPORTED IMMEDIATELY:  *FEVER GREATER THAN 100.5 F  *CHILLS WITH OR WITHOUT FEVER  NAUSEA AND VOMITING THAT IS NOT CONTROLLED WITH YOUR NAUSEA MEDICATION  *UNUSUAL SHORTNESS OF BREATH  *UNUSUAL BRUISING OR BLEEDING  TENDERNESS IN MOUTH AND THROAT WITH OR WITHOUT PRESENCE OF ULCERS  *URINARY PROBLEMS  *BOWEL PROBLEMS  UNUSUAL RASH Items with * indicate a potential emergency and should be followed up as soon as possible.  Please let the nurse know about any problems that you may have experienced. Feel free to call the clinic you have any questions or concerns. The clinic phone number is [phone].   I have been informed and understand all the instructions given to me. I know to contact the clinic, my physician, or go to the Emergency Department if any problems should occur. I do not have any questions at this time, but understand that I may call the clinic during office hours   should I have any questions or need assistance in obtaining follow up care.    __________________________________________  _____________  __________ Signature of Patient or Authorized Representative            Date                   Time    __________________________________________ Nurse's Signature

## 2012-06-30 ENCOUNTER — Ambulatory Visit (HOSPITAL_BASED_OUTPATIENT_CLINIC_OR_DEPARTMENT_OTHER): Payer: Medicare Other

## 2012-06-30 VITALS — BP 120/65 | HR 91 | Temp 97.5°F

## 2012-06-30 DIAGNOSIS — C20 Malignant neoplasm of rectum: Secondary | ICD-10-CM

## 2012-06-30 DIAGNOSIS — Z452 Encounter for adjustment and management of vascular access device: Secondary | ICD-10-CM

## 2012-06-30 MED ORDER — SODIUM CHLORIDE 0.9 % IJ SOLN
10.0000 mL | INTRAMUSCULAR | Status: DC | PRN
  Administered 2012-06-30: 10 mL
  Filled 2012-06-30: qty 10

## 2012-06-30 MED ORDER — HEPARIN SOD (PORK) LOCK FLUSH 100 UNIT/ML IV SOLN
500.0000 [IU] | Freq: Once | INTRAVENOUS | Status: AC | PRN
  Administered 2012-06-30: 500 [IU]
  Filled 2012-06-30: qty 5

## 2012-07-13 ENCOUNTER — Ambulatory Visit (INDEPENDENT_AMBULATORY_CARE_PROVIDER_SITE_OTHER): Payer: Medicare Other | Admitting: Internal Medicine

## 2012-07-13 ENCOUNTER — Ambulatory Visit (INDEPENDENT_AMBULATORY_CARE_PROVIDER_SITE_OTHER)
Admission: RE | Admit: 2012-07-13 | Discharge: 2012-07-13 | Disposition: A | Discharge status: Home / Self Care | Payer: Medicare Other | Source: Ambulatory Visit | Attending: Internal Medicine | Admitting: Internal Medicine

## 2012-07-13 ENCOUNTER — Encounter: Payer: Self-pay | Admitting: Internal Medicine

## 2012-07-13 VITALS — BP 112/62 | HR 88 | Temp 98.3°F | Ht 70.0 in | Wt 185.0 lb

## 2012-07-13 DIAGNOSIS — I10 Essential (primary) hypertension: Secondary | ICD-10-CM

## 2012-07-13 DIAGNOSIS — J449 Chronic obstructive pulmonary disease, unspecified: Secondary | ICD-10-CM

## 2012-07-13 DIAGNOSIS — J9610 Chronic respiratory failure, unspecified whether with hypoxia or hypercapnia: Secondary | ICD-10-CM

## 2012-07-13 MED ORDER — PREDNISONE (PAK) 10 MG PO TABS: ORAL_TABLET | ORAL | Status: DC

## 2012-07-13 MED ORDER — BISOPROLOL FUMARATE 10 MG PO TABS: 10.0000 mg | ORAL_TABLET | Freq: Every day | ORAL | Status: DC

## 2012-07-13 MED ORDER — BUDESONIDE-FORMOTEROL FUMARATE 160-4.5 MCG/ACT IN AERO: INHALATION_SPRAY | RESPIRATORY_TRACT | Status: DC

## 2012-07-13 NOTE — Patient Instructions (Addendum)
Stop advair, lisinopril and coreg  Bisoprolol 10 mg daily  02 is 2.lpm sleeping and walking but ok to leave off at rest  Start new Plan A= automatic symbicort 160 Take 2 puffs first thing in am and then another 2 puffs about 12 hours later.    Only use your albuterol (plan B Combivent,  PLan C is nebulizer with albuterol/ipatropium = duoneb) as a rescue medication to be used if you can't catch your breath by resting or doing a relaxed purse lip breathing pattern. The less you use it, the better it will work when you need it.   Work on inhaler technique:  relax and gently blow all the way out then take a nice smooth deep breath back in, triggering the inhaler at same time you start breathing in.  Hold for up to 5 seconds if you can.  Rinse and gargle with water when done  Prednisone 10 mg take  4 each am x 2 days,   2 each am x 2 days,  1 each am x 2days and stop   See Tammy NP w/in 2 weeks with all your medications, even over the counter meds, separated in two separate bags, the ones you take no matter (including your pillbox and pillbox meds)  what vs the ones you stop once you feel better and take only as needed when you feel you need them.   Tammy  will generate for you a new user friendly medication calendar that will put [place] all on the same page re: your medication use.     Without this process, it simply isn't possible to assure that we are providing  your outpatient care  with  the attention to detail we feel you deserve.   If we cannot assure that you're getting that kind of care,  then we cannot manage your problem effectively from this clinic.  Once you have seen Tammy and we are sure that we're all on the same page with your medication use she will arrange follow up with me.      Please remember to go to the xray department downstairs for your tests - we will call you with the results when they are available.

## 2012-07-13 NOTE — Progress Notes (Signed)
Subjective:    Patient ID: Vincent [name], male    DOB: 1932-08-27  MRN: [id number]  HPI  79 yowm quit smoking Oct 2010 on home 02 concentrator since at least 2013   Admit date: 06/02/2012  Discharge date: 06/09/2012  Discharge Diagnoses: *Acute respiratory failure with hypercapnia secondary to COPD  Principal Problem:  *Acute respiratory failure with hypercapnia  Active Problems:  Abdominal pain  Thrombocytopenia  Hyponatremia  Weakness generalized  Anemia  PAF (paroxysmal atrial fibrillation)  DM (diabetes mellitus)  HTN (hypertension)  Chronic diastolic CHF (congestive heart failure)  C. difficile diarrhea  Dehydration  Acute kidney injury  Anticoagulation excessive  Tobacco abuse  COPD (chronic obstructive pulmonary disease)  Sinusitis  Discharge Condition: Stable  Diet recommendation: Heart healthy diet discussed in details  Brief narrative:  The patient is a 79 yo M with hx of rectal cancer s/p colostomy and recurrent SBO, currently on chemotherapy, severe COPD, oxygen dependent 2 L, continuous flow who presented with several days of N/V/D, severe dehydration, and progressively worsening shortness of breath that initially started 6 weeks prior to this admission while patient was recovering from recurrent colon cancer and undergoing chemotherapy.Marland Kitchen He was hydrated, transitioned back to nasal canula. He was c. Diff positive and was started on treatment. Required an admission 01/31. At baseline, patient uses Combivent nebulizers as maintenance treatment, did not feel Spiriva helped.  Acute on chronic respiratory failure with hypercapnea:  - Secondary to COPD exacerbation  - Patient is clinically improving and maintaining oxygen saturations at target range on nasal cannula  - Patient is on oxygen at home, 2 L continuous flow and will continue to use oxygen at home upon discharge  - Continue budesonide, duonebs prn  Bilateral ethmoid sinusitis  - Levofloxacin x 5 more days post  discharge  - Continued nasal saline, flonase, afrin  C. Diff Diarrhea, mild, with more formed stools  - Patient was transition from Flagyl to oral vancomycin due to difficulty adjusting INR  - stool culture neg  - diarrhea resolved  - pt will be discharged on oral Vancomycin oral solution  Slurred speech:  - Now resolved  - MRI of the brain negative  PAF (paroxysmal atrial fibrillation) with RVR in ER that responded to IVF.  - Rate controlled, no events on telemetry over 72 hours  - Continue bisoprolol  - Continue digoxin  - Electrolytes have been stable  DM (diabetes mellitus)  - Continued high dose SSI and control of CBG reasonable while inpatient  HTN (hypertension)  - blood pressure stable  Chronic diastolic CHF (congestive heart failure), grade 1:  - Monitor carefully for evidence of hypervolemia, currently appears euvolemic  Rectal cancer:  Acute kidney failure  - likely prerenal and some ATN from dehydration and hypotesion, trended down with hydration.  - D/c'd labs as patient stated he would just like to be comfortable.  Weakness generalized:  - PT/OT recommending home health PT/OT   07/13/2012 1st pulmonary outpt eval ? Back on ACEI  [facility] walked slowly around the ward s 02 at [facility] lost ground w/in 2 weeks of discharge doing his own meds but very poor memory and understanding of meds/ 02 with worsening fatigue > sob  No obvious daytime variabilty or assoc chronic cough or cp or chest tightness, subjective wheeze overt sinus or hb symptoms. No unusual exp hx or h/o childhood pna/ asthma or premature birth to his knowledge.    No obvious daytime variabilty or assoc purulent sputum  production or cp or chest tightness, subjective wheeze overt sinus or hb symptoms. No unusual exp hx or h/o childhood pna/ asthma or premature birth to his knowledge.   Sleeping ok on cpap/02 without nocturnal  or early am exacerbation  of respiratory  c/o's or need for noct saba. Also denies  any obvious fluctuation of symptoms with weather or environmental changes or other aggravating or alleviating factors except as outlined above    Review of Systems  Constitutional: Negative for fever, chills, activity change, appetite change and unexpected weight change.  HENT: Negative for congestion, sore throat, rhinorrhea, sneezing, trouble swallowing, dental problem, voice change and postnasal drip.   Eyes: Negative for visual disturbance.  Respiratory: Positive for cough and shortness of breath. Negative for choking.   Cardiovascular: Negative for chest pain and leg swelling.  Gastrointestinal: Negative for nausea, vomiting and abdominal pain.  Genitourinary: Negative for difficulty urinating.  Musculoskeletal: Negative for arthralgias.  Skin: Negative for rash.  Psychiatric/Behavioral: Negative for behavioral problems and confusion.       Objective:   Physical Exam  Depressed appearing amb wm  failed to answer a single question asked in a straightforward manner, tending to go off on tangents or answer questions with ambiguous medical terms or diagnoses and seemed aggravated  when asked the same question more than once for clarification.  Wt Readings from Last 3 Encounters:  07/13/12 185 lb (83.915 kg)  06/24/12 187 lb 3.2 oz (84.913 kg)  06/09/12 180 lb 12.8 oz (82.01 kg)    HEENT mild turbinate edema.  Oropharynx no thrush or excess pnd or cobblestoning.  No JVD or cervical adenopathy. Mild accessory muscle hypertrophy. Trachea midline, nl thryroid. Chest was hyperinflated by percussion with diminished breath sounds and moderate increased exp time without wheeze. Hoover sign positive at mid inspiration. Regular rate and rhythm without murmur gallop or rub or increase P2 or edema.  Abd: no hsm, nl excursion. Ext warm without cyanosis or clubbing.    CXR  07/13/2012 :    1. No acute cardiopulmonary abnormalities.  2. Chronic interstitial change of COPD.        Assessment &  Plan:

## 2012-07-13 NOTE — Progress Notes (Signed)
Quick Note:  Spoke with pt and notified of results per Dr. Wert. Pt verbalized understanding and denied any questions.  ______ 

## 2012-07-14 DIAGNOSIS — J9610 Chronic respiratory failure, unspecified whether with hypoxia or hypercapnia: Secondary | ICD-10-CM | POA: Insufficient documentation

## 2012-07-14 NOTE — Assessment & Plan Note (Signed)
07/13/12 - o2 sat 82% ra---increased to 92%2 lpm continuous for now

## 2012-07-14 NOTE — Assessment & Plan Note (Signed)
Strongly prefer in this setting: Bystolic, the most beta -1  selective Beta blocker available in sample form, with bisoprolol the most selective generic choice  on the market.   Avoid acei both for his airway and cri issues

## 2012-07-14 NOTE — Assessment & Plan Note (Addendum)
DDX of  difficult airways managment all start with A and  include Adherence, Ace Inhibitors, Acid Reflux, Active Sinus Disease, Alpha 1 Antitripsin deficiency, Anxiety masquerading as Airways dz,  ABPA,  allergy(esp in young), Aspiration (esp in elderly), Adverse effects of DPI,  Active smokers, plus two Bs  = Bronchiectasis and Beta blocker use..and one C= CHF  Adherence is always the initial "prime suspect" and is a multilayered concern that requires a "trust but verify" approach in every patient - starting with knowing how to use medications, especially inhalers, correctly, keeping up with refills and understanding the fundamental difference between maintenance and prns vs those medications only taken for a very short course and then stopped and not refilled.  The proper method of use, as well as anticipated side effects, of a metered-dose inhaler are discussed and demonstrated to the patient. Improved effectiveness after extensive coaching during this visit to a level of approximately  75% so try symbicort 160 2bid and off advair  ? Acei effect > d/c permanently given reasonable alternatives  ? Adverse effect of dpi > try off advair  ? Active sinus dz >  POS 06/09/12 so low threshold to repeat scan, avoid empiric abx if possible due to c diff concerns  He struggles with concept of med reconciliation >  To keep things simple, I have asked the patient to first separate medicines that are perceived as maintenance, that is to be taken daily "no matter what", from those medicines that are taken on only on an as-needed basis and I have given the patient examples of both, and then return to see our NP to generate a  detailed  medication calendar which should be followed until the next physician sees the patient and updates it.

## 2012-07-18 ENCOUNTER — Ambulatory Visit: Payer: Self-pay | Admitting: Cardiovascular Disease

## 2012-07-18 DIAGNOSIS — I480 Paroxysmal atrial fibrillation: Secondary | ICD-10-CM

## 2012-07-18 DIAGNOSIS — Z7901 Long term (current) use of anticoagulants: Secondary | ICD-10-CM | POA: Insufficient documentation

## 2012-07-19 ENCOUNTER — Ambulatory Visit (INDEPENDENT_AMBULATORY_CARE_PROVIDER_SITE_OTHER): Payer: Medicare Other | Admitting: Adult Health

## 2012-07-19 ENCOUNTER — Encounter: Payer: Self-pay | Admitting: Adult Health

## 2012-07-19 VITALS — BP 118/60 | HR 54 | Temp 97.4°F | Ht 71.0 in | Wt 193.6 lb

## 2012-07-19 DIAGNOSIS — J449 Chronic obstructive pulmonary disease, unspecified: Secondary | ICD-10-CM

## 2012-07-19 NOTE — Progress Notes (Signed)
Subjective:    Patient ID: Vincent [name], male    DOB: Jun 12, 1932  MRN: [id number]  HPI  79 yowm quit smoking Oct 2010 on home 02 concentrator since at least 2013   Admit date: 06/02/2012  Discharge date: 06/09/2012  Discharge Diagnoses: *Acute respiratory failure with hypercapnia secondary to COPD  Principal Problem:  *Acute respiratory failure with hypercapnia  Active Problems:  Abdominal pain  Thrombocytopenia  Hyponatremia  Weakness generalized  Anemia  PAF (paroxysmal atrial fibrillation)  DM (diabetes mellitus)  HTN (hypertension)  Chronic diastolic CHF (congestive heart failure)  C. difficile diarrhea  Dehydration  Acute kidney injury  Anticoagulation excessive  Tobacco abuse  COPD (chronic obstructive pulmonary disease)  Sinusitis  Discharge Condition: Stable  Diet recommendation: Heart healthy diet discussed in details  Brief narrative:  The patient is a 79 yo M with hx of rectal cancer s/p colostomy and recurrent SBO, currently on chemotherapy, severe COPD, oxygen dependent 2 L, continuous flow who presented with several days of N/V/D, severe dehydration, and progressively worsening shortness of breath that initially started 6 weeks prior to this admission while patient was recovering from recurrent colon cancer and undergoing chemotherapy.Marland Kitchen He was hydrated, transitioned back to nasal canula. He was c. Diff positive and was started on treatment. Required an admission 01/31. At baseline, patient uses Combivent nebulizers as maintenance treatment, did not feel Spiriva helped.  Acute on chronic respiratory failure with hypercapnea:  - Secondary to COPD exacerbation  - Patient is clinically improving and maintaining oxygen saturations at target range on nasal cannula  - Patient is on oxygen at home, 2 L continuous flow and will continue to use oxygen at home upon discharge  - Continue budesonide, duonebs prn  Bilateral ethmoid sinusitis  - Levofloxacin x 5 more days post  discharge  - Continued nasal saline, flonase, afrin  C. Diff Diarrhea, mild, with more formed stools  - Patient was transition from Flagyl to oral vancomycin due to difficulty adjusting INR  - stool culture neg  - diarrhea resolved  - pt will be discharged on oral Vancomycin oral solution  Slurred speech:  - Now resolved  - MRI of the brain negative  PAF (paroxysmal atrial fibrillation) with RVR in ER that responded to IVF.  - Rate controlled, no events on telemetry over 72 hours  - Continue bisoprolol  - Continue digoxin  - Electrolytes have been stable  DM (diabetes mellitus)  - Continued high dose SSI and control of CBG reasonable while inpatient  HTN (hypertension)  - blood pressure stable  Chronic diastolic CHF (congestive heart failure), grade 1:  - Monitor carefully for evidence of hypervolemia, currently appears euvolemic  Rectal cancer:  Acute kidney failure  - likely prerenal and some ATN from dehydration and hypotesion, trended down with hydration.  - D/c'd labs as patient stated he would just like to be comfortable.  Weakness generalized:  - PT/OT recommending home health PT/OT   07/13/2012 1st pulmonary outpt eval ? Back on ACEI  [facility] walked slowly around the ward s 02 at [facility] ground w/in 2 weeks of discharge doing his own meds but very poor memory and understanding of meds/ 02 with worsening fatigue > sob  >>change off lisinopril , advair and coreg to Bystolic and symbicort   07/19/2012 Follow up and med review  Pt returns for 1 week follow up for med calendar .  Seen for pulmonary consult last week for cough and dyspnea/wheezing. Had recent COPD exacerbaiton  with hospitalization complicated by sinusitis , C Diff colitis .  He finished last treatment of chemo last week. Has repeat CT abd in few weeks.   Feels about the same , no significant change in cough or dsypena.  No fever, discolored mucus. Dyspnea is mainly with activity  No dyspnea at rest.  No  increased swelling.   We reviewed all his meds and organized them into a med calendar with pt education .appears to be taking meds correctly.      Review of Systems  Constitutional: Negative for fever, chills, activity change, appetite change and unexpected weight change.  HENT: Negative for congestion, sore throat, rhinorrhea, sneezing, trouble swallowing, dental problem, voice change and postnasal drip.   Eyes: Negative for visual disturbance.  Respiratory: Positive for cough and shortness of breath. Negative for choking.   Cardiovascular: Negative for chest pain and leg swelling.  Gastrointestinal: Negative for nausea, vomiting and abdominal pain.  Genitourinary: Negative for difficulty urinating.  Musculoskeletal: Negative for arthralgias.  Skin: Negative for rash.  Psychiatric/Behavioral: Negative for behavioral problems and confusion.       Objective:   Physical Exam 07/19/2012 193    HEENT mild turbinate edema.  Oropharynx no thrush or excess pnd or cobblestoning.  No JVD or cervical adenopathy. Mild accessory muscle hypertrophy. Trachea midline, nl thryroid. Chest was hyperinflated by percussion with diminished breath sounds and moderate increased exp time without wheeze. Hoover sign positive at mid inspiration. Regular rate and rhythm without murmur gallop or rub or increase P2 or edema.  Abd: no hsm, nl excursion. Ext warm without cyanosis or clubbing.    CXR  07/13/2012 :    1. No acute cardiopulmonary abnormalities.  2. Chronic interstitial change of COPD.        Assessment & Plan:

## 2012-07-19 NOTE — Assessment & Plan Note (Signed)
Continue on current regimen for now  Will have him return for PFT  Consider adding spiriva in future once we get his baseline fxn  Also will be off ACE and nonselective BB on return for at least 4 weeks to see if cough/wheezing are decreased.   Patient's medications were reviewed today and patient education was given. Computerized medication calendar was adjusted/completed

## 2012-07-19 NOTE — Patient Instructions (Addendum)
Continue on current regimen.  Follow med calendar closely and bring to each visit.  follow up Dr. Sherene Sires  In 4 weeks with PFT

## 2012-07-25 ENCOUNTER — Encounter: Payer: Self-pay | Admitting: Oncology

## 2012-07-27 ENCOUNTER — Encounter: Payer: Medicare Other | Admitting: Adult Health

## 2012-07-29 NOTE — Addendum Note (Signed)
Addended by: Boone Master E on: 07/29/2012 01:47 PM   Modules accepted: Orders, Medications

## 2012-08-02 ENCOUNTER — Other Ambulatory Visit (HOSPITAL_BASED_OUTPATIENT_CLINIC_OR_DEPARTMENT_OTHER): Payer: Medicare Other

## 2012-08-02 ENCOUNTER — Encounter (HOSPITAL_COMMUNITY): Payer: Self-pay

## 2012-08-02 ENCOUNTER — Ambulatory Visit (HOSPITAL_COMMUNITY)
Admission: RE | Admit: 2012-08-02 | Discharge: 2012-08-02 | Disposition: A | Discharge status: Home / Self Care | Payer: Medicare Other | Source: Ambulatory Visit | Attending: Oncology | Admitting: Oncology

## 2012-08-02 DIAGNOSIS — C20 Malignant neoplasm of rectum: Secondary | ICD-10-CM | POA: Insufficient documentation

## 2012-08-02 DIAGNOSIS — J90 Pleural effusion, not elsewhere classified: Secondary | ICD-10-CM | POA: Insufficient documentation

## 2012-08-02 DIAGNOSIS — Z923 Personal history of irradiation: Secondary | ICD-10-CM | POA: Insufficient documentation

## 2012-08-02 DIAGNOSIS — I059 Rheumatic mitral valve disease, unspecified: Secondary | ICD-10-CM | POA: Insufficient documentation

## 2012-08-02 DIAGNOSIS — K5730 Diverticulosis of large intestine without perforation or abscess without bleeding: Secondary | ICD-10-CM | POA: Insufficient documentation

## 2012-08-02 DIAGNOSIS — C7919 Secondary malignant neoplasm of other urinary organs: Secondary | ICD-10-CM | POA: Insufficient documentation

## 2012-08-02 DIAGNOSIS — Z9221 Personal history of antineoplastic chemotherapy: Secondary | ICD-10-CM | POA: Insufficient documentation

## 2012-08-02 DIAGNOSIS — K439 Ventral hernia without obstruction or gangrene: Secondary | ICD-10-CM | POA: Insufficient documentation

## 2012-08-02 DIAGNOSIS — C7982 Secondary malignant neoplasm of genital organs: Secondary | ICD-10-CM | POA: Insufficient documentation

## 2012-08-02 DIAGNOSIS — I714 Abdominal aortic aneurysm, without rupture, unspecified: Secondary | ICD-10-CM | POA: Insufficient documentation

## 2012-08-02 DIAGNOSIS — I700 Atherosclerosis of aorta: Secondary | ICD-10-CM | POA: Insufficient documentation

## 2012-08-02 DIAGNOSIS — I2510 Atherosclerotic heart disease of native coronary artery without angina pectoris: Secondary | ICD-10-CM | POA: Insufficient documentation

## 2012-08-02 DIAGNOSIS — N289 Disorder of kidney and ureter, unspecified: Secondary | ICD-10-CM | POA: Insufficient documentation

## 2012-08-02 DIAGNOSIS — Z933 Colostomy status: Secondary | ICD-10-CM | POA: Insufficient documentation

## 2012-08-02 DIAGNOSIS — K7689 Other specified diseases of liver: Secondary | ICD-10-CM | POA: Insufficient documentation

## 2012-08-02 DIAGNOSIS — J438 Other emphysema: Secondary | ICD-10-CM | POA: Insufficient documentation

## 2012-08-02 LAB — CBC WITH DIFFERENTIAL/PLATELET
Eosinophils Absolute: 0.1 10*3/uL (ref 0.0–0.5)
MCV: 93 fL (ref 79.3–98.0)
MONO%: 8 % (ref 0.0–14.0)
NEUT#: 5 10*3/uL (ref 1.5–6.5)
RBC: 3.04 10*6/uL — ABNORMAL LOW (ref 4.20–5.82)
RDW: 20.1 % — ABNORMAL HIGH (ref 11.0–14.6)
WBC: 6.1 10*3/uL (ref 4.0–10.3)

## 2012-08-02 LAB — COMPREHENSIVE METABOLIC PANEL (CC13)
AST: 12 U/L (ref 5–34)
Albumin: 2.4 g/dL — ABNORMAL LOW (ref 3.5–5.0)
Alkaline Phosphatase: 68 U/L (ref 40–150)
Glucose: 109 mg/dl — ABNORMAL HIGH (ref 70–99)
Potassium: 4.1 mEq/L (ref 3.5–5.1)
Sodium: 141 mEq/L (ref 136–145)
Total Protein: 6.6 g/dL (ref 6.4–8.3)

## 2012-08-02 MED ORDER — IOHEXOL 300 MG/ML  SOLN
100.0000 mL | Freq: Once | INTRAMUSCULAR | Status: AC | PRN
  Administered 2012-08-02: 100 mL via INTRAVENOUS

## 2012-08-05 ENCOUNTER — Telehealth: Payer: Self-pay | Admitting: *Deleted

## 2012-08-05 ENCOUNTER — Ambulatory Visit: Payer: Medicare Other | Admitting: Oncology

## 2012-08-10 ENCOUNTER — Ambulatory Visit (HOSPITAL_BASED_OUTPATIENT_CLINIC_OR_DEPARTMENT_OTHER): Payer: Medicare Other | Admitting: Oncology

## 2012-08-10 ENCOUNTER — Telehealth: Payer: Self-pay | Admitting: Oncology

## 2012-08-10 VITALS — BP 118/76 | HR 76 | Temp 97.3°F | Resp 24 | Wt 199.0 lb

## 2012-08-10 DIAGNOSIS — D649 Anemia, unspecified: Secondary | ICD-10-CM

## 2012-08-10 DIAGNOSIS — C20 Malignant neoplasm of rectum: Secondary | ICD-10-CM

## 2012-08-10 DIAGNOSIS — I4891 Unspecified atrial fibrillation: Secondary | ICD-10-CM

## 2012-08-10 DIAGNOSIS — G609 Hereditary and idiopathic neuropathy, unspecified: Secondary | ICD-10-CM

## 2012-08-10 MED ORDER — HEPARIN SOD (PORK) LOCK FLUSH 100 UNIT/ML IV SOLN
500.0000 [IU] | Freq: Once | INTRAVENOUS | Status: AC
  Administered 2012-08-10: 500 [IU] via INTRAVENOUS
  Filled 2012-08-10: qty 5

## 2012-08-10 MED ORDER — SODIUM CHLORIDE 0.9 % IJ SOLN
10.0000 mL | INTRAMUSCULAR | Status: DC | PRN
  Administered 2012-08-10: 10 mL via INTRAVENOUS
  Filled 2012-08-10: qty 10

## 2012-08-10 NOTE — Progress Notes (Signed)
Hematology and Oncology Follow Up Visit  Vincent [name] [id number] 05/01/1933 79 y.o. 08/10/2012 1:54 PM  CC: Velora Heckler, MD  Tracey Harries, M.D.  Iva Boop, MD,FACG  Billie Lade, Ph.D., M.D.    Principle Diagnosis: This is a 79 year old gentleman diagnosed with adenocarcinoma of the rectum.  He had T4a N2 disease diagnosed in 2011.  He presented obstruction of the distal rectal area. Now has local relapse.    Prior Therapy: 1. Status post rectosigmoid resection and segmental resection on February 13, 2010.  Tumor was poorly differentiated adenocarcinoma, 8 out of 16 lymph nodes involved, tumor not completely resected at that time.  2. The patient received radiation therapy adamantly concomitantly with Xeloda.  Therapy concluded in January 2012.  3. Treated with adjuvant FOLFOX for a total of 7 cycles of therapy.  The therapy concluded Sep 16, 2010.  Current therapy: FOLFIRI for salvage purposes. He started FOLFIRI on 9/25. He is S/P cycle 8 on 06/24/2012.   Interim History:  Vincent [name] presents today for an office followup visit. He has done [facility] on chemotherapy. He was hospitalized between 1/30 to 2/6 for COPD and CHF exacerbation as well as respiratory failure. He is feeling better and following with Dr. Sherene Sires.  He has not had any abdominal pain. No nausea or vomiting. No problems with constipation.  Continues to have peripheral neuropathy that is unchanged. He denies any headaches, any visual changes, any dysphagia, odynophagia, any nausea, vomiting, diarrhea, constipation, chest pain, shortness of breath, productive cough, abdominal pain, abdominal swelling, lower extremity paresthesia, bowel or bladder incontinence, but report frequency with urination.   Medications: I have reviewed the patient's current medications. Current outpatient prescriptions:albuterol-ipratropium (COMBIVENT) 18-103 MCG/ACT inhaler, Inhale 2 puffs into the lungs every 4 (four) hours as needed for wheezing or  shortness of breath. , Disp: , Rfl: ;  aspirin EC 81 MG tablet, Take 81 mg by mouth daily., Disp: , Rfl: ;  budesonide-formoterol (SYMBICORT) 160-4.5 MCG/ACT inhaler, Take 2 puffs first thing in am and then another 2 puffs about 12 hours later., Disp: , Rfl:  fluticasone (FLONASE) 50 MCG/ACT nasal spray, Place 2 sprays into the nose every 12 (twelve) hours as needed for rhinitis., Disp: , Rfl: ;  HYDROmorphone (DILAUDID) 4 MG tablet, Take 1 tablet (4 mg total) by mouth every 6 (six) hours as needed. Pain, Disp: 45 tablet, Rfl: 0;  metFORMIN (GLUCOPHAGE) 500 MG tablet, Take 500 mg by mouth daily with breakfast. , Disp: , Rfl: ;  nebivolol (BYSTOLIC) 10 MG tablet, Take 10 mg by mouth daily., Disp: , Rfl:  nitroGLYCERIN (NITROSTAT) 0.3 MG SL tablet, Place 0.3 mg under the tongue every 5 (five) minutes as needed. For chest pain., Disp: , Rfl: ;  oxyCODONE (OXY IR/ROXICODONE) 5 MG immediate release tablet, Take 1 tablet (5 mg total) by mouth every 6 (six) hours as needed. Breakthrough pain, Disp: 30 tablet, Rfl: 0;  polyethylene glycol powder (GLYCOLAX/MIRALAX) powder, Take 17 g by mouth daily., Disp: , Rfl:  predniSONE (STERAPRED UNI-PAK) 10 MG tablet, Prednisone 10 mg take  4 each am x 2 days,   2 each am x 2 days,  1 each am x2days and stop, Disp: 14 tablet, Rfl: 0;  simvastatin (ZOCOR) 40 MG tablet, Take 1 tablet (40 mg total) by mouth at bedtime., Disp: 30 tablet, Rfl: 0;  sodium chloride (OCEAN) 0.65 % nasal spray, Place 2 sprays into the nose every 4 (four) hours as needed for congestion., Disp: ,  Rfl:  temazepam (RESTORIL) 15 MG capsule, Take 15 mg by mouth at bedtime as needed for sleep., Disp: , Rfl: ;  warfarin (COUMADIN) 5 MG tablet, Take 5 mg by mouth., Disp: , Rfl:   Allergies: No Known Allergies  Past Medical History, Surgical history, Social history, and Family History were reviewed and updated.  Review of Systems: Constitutional:  Negative for fever, chills, night sweats, anorexia, weight  loss, pain. Cardiovascular: no chest pain or dyspnea on exertion Respiratory: no cough, shortness of breath, or wheezing Neurological: no TIA or stroke symptoms Dermatological: negative ENT: negative Skin: Negative. Gastrointestinal: no abdominal pain, change in bowel habits, or black or bloody stools Genito-Urinary: no dysuria, trouble voiding, or hematuria Hematological and Lymphatic: negative Breast: negative Musculoskeletal: negative Remaining ROS negative.  Physical Exam: There were no vitals taken for this visit. ECOG: 2 General appearance: alert Head: Normocephalic, without obvious abnormality, atraumatic Neck: no adenopathy, no carotid bruit, no JVD, supple, symmetrical, trachea midline and thyroid not enlarged, symmetric, no tenderness/mass/nodules Lymph nodes: Cervical, supraclavicular, and axillary nodes normal. Heart:regular rate and rhythm, S1, S2 normal, no murmur, click, rub or gallop Lung:chest clear, no wheezing, rales, normal symmetric air entry Abdomen: soft, non-tender, without masses or organomegaly EXT:no erythema, induration, or nodules   Lab Results: Lab Results  Component Value Date   WBC 6.1 08/02/2012   HGB 9.1* 08/02/2012   HCT 28.3* 08/02/2012   MCV 93.0 08/02/2012   PLT 233 08/02/2012    Impression and Plan: This is a pleasant 79 year old gentleman with the following issues:   1. Advanced rectal carcinoma, presented with a T4 N2 disease.  He presented with a complete obstruction.  After segmental resection, he underwent radiation therapy with Xeloda and subsequently systemic chemotherapy.  At this time, he has  evidence of  recurrent disease in the pelvic area. After 8 cycles of chemotherapy, he had a scan on 08/02/2012 that was discussed today. It appears that his pelvic tumor has increased in size (I doubt this is an abscess or infection). He has really no good options of treatments. No radiation, surgery or further chemo could help. I suggested hospice and  supportive care at this time. I think his prognosis is poor and life expectancy of months (6 or less). He will think about it and let me know weather he wants hospice involved.  2. Anemia. Likely multi-factorial. He has no active bleeding. No transfusion is indicated. He remains on ferrous sulfate. 3. Constipation: Improved at this time. 4. Protein calorie malnutrition: Appetite is stable.   5. Port-A-Cath management. This will be flushed today. 6. Atrial fibrillation. Anticoagulated with Coumadin. 7. Hypertension.  Normotensive without medication at this time. 8. DM. On Metformin. 9. Followup: in 6 weeks for a visit and port flush.   SHADAD,FIRAS 4/9/20141:54 PM

## 2012-08-16 ENCOUNTER — Encounter: Payer: Self-pay | Admitting: Internal Medicine

## 2012-08-16 ENCOUNTER — Ambulatory Visit (INDEPENDENT_AMBULATORY_CARE_PROVIDER_SITE_OTHER): Payer: Medicare Other | Admitting: Internal Medicine

## 2012-08-16 VITALS — BP 120/70 | HR 73 | Temp 97.2°F | Ht 70.0 in | Wt 206.0 lb

## 2012-08-16 DIAGNOSIS — J449 Chronic obstructive pulmonary disease, unspecified: Secondary | ICD-10-CM

## 2012-08-16 DIAGNOSIS — J9610 Chronic respiratory failure, unspecified whether with hypoxia or hypercapnia: Secondary | ICD-10-CM

## 2012-08-16 MED ORDER — ACLIDINIUM BROMIDE 400 MCG/ACT IN AEPB: 1.0000 | INHALATION_SPRAY | Freq: Two times a day (BID) | RESPIRATORY_TRACT | Status: DC

## 2012-08-16 NOTE — Progress Notes (Signed)
Subjective:    Patient ID: Vincent [name], male    DOB: 1933-02-23  MRN: [id number]  HPI  72 yowm quit smoking Oct 2010 on home 02 concentrator since at least 2013   Admit date: 06/02/2012  Discharge date: 06/09/2012  Discharge Diagnoses: *Acute respiratory failure with hypercapnia secondary to COPD  Principal Problem:  *Acute respiratory failure with hypercapnia  Active Problems:  Abdominal pain  Thrombocytopenia  Hyponatremia  Weakness generalized  Anemia  PAF (paroxysmal atrial fibrillation)  DM (diabetes mellitus)  HTN (hypertension)  Chronic diastolic CHF (congestive heart failure)  C. difficile diarrhea  Dehydration  Acute kidney injury  Anticoagulation excessive  Tobacco abuse  COPD (chronic obstructive pulmonary disease)  Sinusitis  Discharge Condition: Stable  Diet recommendation: Heart healthy diet discussed in details  Brief narrative:  The patient is a 79 yo M with hx of rectal cancer s/p colostomy and recurrent SBO, currently on chemotherapy, severe COPD, oxygen dependent 2 L, continuous flow who presented with several days of N/V/D, severe dehydration, and progressively worsening shortness of breath that initially started 6 weeks prior to this admission while patient was recovering from recurrent colon cancer and undergoing chemotherapy.Marland Kitchen He was hydrated, transitioned back to nasal canula. He was c. Diff positive and was started on treatment. Required an admission 01/31. At baseline, patient uses Combivent nebulizers as maintenance treatment, did not feel Spiriva helped.  Acute on chronic respiratory failure with hypercapnea:  - Secondary to COPD exacerbation  - Patient is clinically improving and maintaining oxygen saturations at target range on nasal cannula  - Patient is on oxygen at home, 2 L continuous flow and will continue to use oxygen at home upon discharge  - Continue budesonide, duonebs prn  Bilateral ethmoid sinusitis  - Levofloxacin x 5 more days post  discharge  - Continued nasal saline, flonase, afrin  C. Diff Diarrhea, mild, with more formed stools  - Patient was transition from Flagyl to oral vancomycin due to difficulty adjusting INR  - stool culture neg  - diarrhea resolved  - pt will be discharged on oral Vancomycin oral solution  Slurred speech:  - Now resolved  - MRI of the brain negative  PAF (paroxysmal atrial fibrillation) with RVR in ER that responded to IVF.  - Rate controlled, no events on telemetry over 72 hours  - Continue bisoprolol  - Continue digoxin  - Electrolytes have been stable  DM (diabetes mellitus)  - Continued high dose SSI and control of CBG reasonable while inpatient  HTN (hypertension)  - blood pressure stable  Chronic diastolic CHF (congestive heart failure), grade 1:  - Monitor carefully for evidence of hypervolemia, currently appears euvolemic  Rectal cancer:  Acute kidney failure  - likely prerenal and some ATN from dehydration and hypotesion, trended down with hydration.  - D/c'd labs as patient stated he would just like to be comfortable.  Weakness generalized:  - PT/OT recommending home health PT/OT   07/13/2012 1st pulmonary outpt eval ? Back on ACEI  [facility] walked slowly around the ward s 02 at [facility] lost ground w/in 2 weeks of discharge doing his own meds but very poor memory and understanding of meds/ 02 with worsening fatigue > sob  >>change off lisinopril , advair and coreg to Bystolic and symbicort   07/19/2012 Follow up and med review  Pt returns for 1 week follow up for med calendar .  Seen for pulmonary consult last week for cough and dyspnea/wheezing. Had recent COPD exacerbaiton  with hospitalization complicated by sinusitis , C Diff colitis .  He finished last treatment of chemo last week. Has repeat CT abd in few weeks.  Feels about the same , no significant change in cough or dsypena.  rec  use med calendar, no change in meds  08/16/2012 f/u ov/Wert re copd no med  calendar Chief Complaint  Patient presents with  . Follow-up    SOB no change.   very sedentary, does do some shopping pushing basket s 02 on symbicort 2 bid but using lots of combivent to get through the day, up to "2 puffs every 4 hours" using one respimat every couple of weeks  No obvious daytime variabilty or assoc chronic cough or cp or chest tightness, subjective wheeze overt sinus or hb symptoms. No unusual exp hx or h/o childhood pna/ asthma or premature birth to his knowledge.   Sleeping ok without nocturnal  or early am exacerbation  of respiratory  c/o's or need for noct saba. Also denies any obvious fluctuation of symptoms with weather or environmental changes or other aggravating or alleviating factors except as outlined above  ROS  The following are not active complaints unless bolded sore throat, dysphagia, dental problems, itching, sneezing,  nasal congestion or excess/ purulent secretions, ear ache,   fever, chills, sweats, unintended wt loss, pleuritic or exertional cp, hemoptysis,  orthopnea pnd or leg swelling, presyncope, palpitations, heartburn, abdominal pain, anorexia, nausea, vomiting, diarrhea  or change in bowel or urinary habits, change in stools or urine, dysuria,hematuria,  rash, arthralgias, visual complaints, headache, numbness weakness or ataxia or problems with walking or coordination,  change in mood/affect or memory.                 Objective:   Physical Exam  07/19/2012 193 > 08/16/2012 206    HEENT mild turbinate edema.  Oropharynx no thrush or excess pnd or cobblestoning.  No JVD or cervical adenopathy. Mild accessory muscle hypertrophy. Trachea midline, nl thryroid. Chest was hyperinflated by percussion with diminished breath sounds and moderate increased exp time without wheeze. Hoover sign positive at mid inspiration. Regular rate and rhythm without murmur gallop or rub or increase P2 or edema.  Abd: no hsm, nl excursion. Ext warm without cyanosis or  clubbing.    CXR  07/13/2012 :    1. No acute cardiopulmonary abnormalities.  2. Chronic interstitial change of COPD.        Assessment & Plan:

## 2012-08-16 NOTE — Progress Notes (Signed)
PFT done today. 

## 2012-08-16 NOTE — Patient Instructions (Addendum)
Add tudorza one twice daily right after taking your symbicort  As you improve you should note less need for combent  Please schedule a follow up office visit in 4 weeks, sooner if needed - bring you medication calendar with you - update it with the tudorza and bring all your medications with you so I can verify the calendar is correct Late add: check 02 use on next ov ? D/c

## 2012-08-18 NOTE — Assessment & Plan Note (Signed)
sats ok on RA need to confirm on next ov ? D/c 02

## 2012-08-18 NOTE — Assessment & Plan Note (Signed)
-   hfa 75% 07/13/12 -med calendar 07/19/2012  - PFTs 08/16/2012 FEV1  1.06 (39%) ratio 37 and no better p B2,  DLCO 47% - tudorza added 08/16/12  The proper method of use, as well as anticipated side effects, of a metered-dose inhaler are discussed and demonstrated to the patient. Improved effectiveness after extensive coaching during this visit to a level of approximately  90% so add tudorza with goal of improving ex tol and reducing dependency on sama an saba    Each maintenance medication was reviewed in detail including most importantly the difference between maintenance and as needed and under what circumstances the prns are to be used. This was done in the context of a medication calendar review which provided the patient with a user-friendly unambiguous mechanism for medication administration and reconciliation and provides an action plan for all active problems. It is critical that this be shown to every doctor  for modification during the office visit if necessary so the patient can use it as a working document.

## 2012-09-13 ENCOUNTER — Encounter: Payer: Self-pay | Admitting: Internal Medicine

## 2012-09-13 ENCOUNTER — Ambulatory Visit (INDEPENDENT_AMBULATORY_CARE_PROVIDER_SITE_OTHER): Payer: Medicare Other | Admitting: Internal Medicine

## 2012-09-13 ENCOUNTER — Telehealth: Payer: Self-pay | Admitting: *Deleted

## 2012-09-13 VITALS — BP 90/60 | HR 65 | Temp 97.8°F | Ht 70.0 in | Wt 187.0 lb

## 2012-09-13 DIAGNOSIS — J9610 Chronic respiratory failure, unspecified whether with hypoxia or hypercapnia: Secondary | ICD-10-CM

## 2012-09-13 DIAGNOSIS — J449 Chronic obstructive pulmonary disease, unspecified: Secondary | ICD-10-CM

## 2012-09-13 MED ORDER — BUDESONIDE-FORMOTEROL FUMARATE 160-4.5 MCG/ACT IN AERO: INHALATION_SPRAY | RESPIRATORY_TRACT | Status: DC

## 2012-09-13 MED ORDER — ALBUTEROL SULFATE HFA 108 (90 BASE) MCG/ACT IN AERS: 2.0000 | INHALATION_SPRAY | Freq: Four times a day (QID) | RESPIRATORY_TRACT | Status: DC | PRN

## 2012-09-13 MED ORDER — ACLIDINIUM BROMIDE 400 MCG/ACT IN AEPB: 1.0000 | INHALATION_SPRAY | Freq: Two times a day (BID) | RESPIRATORY_TRACT | Status: DC

## 2012-09-13 NOTE — Patient Instructions (Signed)
symbicort Take 2 puffs first thing in am and then another 2 puffs about 12 hours later.    follow with turdorza one twice daily as per your med calendar  Only use your albuterol(proaire) as a rescue medication to be used if you can't catch your breath by resting or doing a relaxed purse lip breathing pattern. The less you use it, the better it will work when you need it.   Please schedule a follow up visit in 3 months but call sooner if needed to see Tammy NP on return with all meds in hand

## 2012-09-13 NOTE — Telephone Encounter (Signed)
Spoke with pt to verify o2 use He states that he is using the o2 only with sleep at 2lpm

## 2012-09-13 NOTE — Progress Notes (Signed)
Subjective:    Patient ID: Vincent [name], male    DOB: 06/13/1932  MRN: [id number]  HPI  25 yowm quit smoking Oct 2010 on home 02 concentrator since at least 2013   Admit date: 06/02/2012  Discharge date: 06/09/2012  Discharge Diagnoses: *Acute respiratory failure with hypercapnia secondary to COPD  Principal Problem:  *Acute respiratory failure with hypercapnia  Active Problems:  Abdominal pain  Thrombocytopenia  Hyponatremia  Weakness generalized  Anemia  PAF (paroxysmal atrial fibrillation)  DM (diabetes mellitus)  HTN (hypertension)  Chronic diastolic CHF (congestive heart failure)  C. difficile diarrhea  Dehydration  Acute kidney injury  Anticoagulation excessive  Tobacco abuse  COPD (chronic obstructive pulmonary disease)  Sinusitis  Discharge Condition: Stable  Diet recommendation: Heart healthy diet discussed in details  Brief narrative:  The patient is a 79 yo M with hx of rectal cancer s/p colostomy and recurrent SBO, currently on chemotherapy, severe COPD, oxygen dependent 2 L, continuous flow who presented with several days of N/V/D, severe dehydration, and progressively worsening shortness of breath that initially started 6 weeks prior to this admission while patient was recovering from recurrent colon cancer and undergoing chemotherapy.Marland Kitchen He was hydrated, transitioned back to nasal canula. He was c. Diff positive and was started on treatment. Required an admission 01/31. At baseline, patient uses Combivent nebulizers as maintenance treatment, did not feel Spiriva helped.  Acute on chronic respiratory failure with hypercapnea:  - Secondary to COPD exacerbation  - Patient is clinically improving and maintaining oxygen saturations at target range on nasal cannula  - Patient is on oxygen at home, 2 L continuous flow and will continue to use oxygen at home upon discharge  - Continue budesonide, duonebs prn  Bilateral ethmoid sinusitis  - Levofloxacin x 5 more days post  discharge  - Continued nasal saline, flonase, afrin  C. Diff Diarrhea, mild, with more formed stools  - Patient was transition from Flagyl to oral vancomycin due to difficulty adjusting INR  - stool culture neg  - diarrhea resolved  - pt will be discharged on oral Vancomycin oral solution  Slurred speech:  - Now resolved  - MRI of the brain negative  PAF (paroxysmal atrial fibrillation) with RVR in ER that responded to IVF.  - Rate controlled, no events on telemetry over 72 hours  - Continue bisoprolol  - Continue digoxin  - Electrolytes have been stable  DM (diabetes mellitus)  - Continued high dose SSI and control of CBG reasonable while inpatient  HTN (hypertension)  - blood pressure stable  Chronic diastolic CHF (congestive heart failure), grade 1:  - Monitor carefully for evidence of hypervolemia, currently appears euvolemic  Rectal cancer:  Acute kidney failure  - likely prerenal and some ATN from dehydration and hypotesion, trended down with hydration.  - D/c'd labs as patient stated he would just like to be comfortable.  Weakness generalized:  - PT/OT recommending home health PT/OT   07/13/2012 1st pulmonary outpt eval ? Back on ACEI  [facility] walked slowly around the ward s 02 at [facility] lost ground w/in 2 weeks of discharge doing his own meds but very poor memory and understanding of meds/ 02 with worsening fatigue > sob  >>change off lisinopril , advair and coreg to Bystolic and symbicort   07/19/2012 Follow up and med review  Pt returns for 1 week follow up for med calendar .  Seen for pulmonary consult last week for cough and dyspnea/wheezing. Had recent COPD exacerbaiton  with hospitalization complicated by sinusitis , C Diff colitis .  He finished last treatment of chemo last week. Has repeat CT abd in few weeks.  Feels about the same , no significant change in cough or dsypena.  rec  use med calendar, no change in meds  08/16/2012 f/u ov/Vincent [name] re copd no med  calendar Chief Complaint  Patient presents with  . Follow-up    SOB no change.   very sedentary, does do some shopping pushing basket s 02 on symbicort 2 bid but using lots of combivent to get through the day, up to "2 puffs every 4 hours" using one respimat every couple of weeks rec Add tudorza one twice daily right after taking your symbicort As you improve you should note less need for combent      09/13/2012 f/u ov/Vincent [name] re copd Chief Complaint  Patient presents with  . Follow-up    Breathing had improved on tudorza and symbicort, but he ran out 2 days ago and having increased SOB since then.    not using combivent as much, really liked the tudorza until it ran out.   No obvious daytime variabilty or assoc chronic cough or cp or chest tightness, subjective wheeze overt sinus or hb symptoms. No unusual exp hx or h/o childhood pna/ asthma or premature birth to his knowledge.   Sleeping ok without nocturnal  or early am exacerbation  of respiratory  c/o's or need for noct saba. Also denies any obvious fluctuation of symptoms with weather or environmental changes or other aggravating or alleviating factors except as outlined above  ROS  The following are not active complaints unless bolded sore throat, dysphagia, dental problems, itching, sneezing,  nasal congestion or excess/ purulent secretions, ear ache,   fever, chills, sweats, unintended wt loss, pleuritic or exertional cp, hemoptysis,  orthopnea pnd or leg swelling, presyncope, palpitations, heartburn, abdominal pain, anorexia, nausea, vomiting, diarrhea  or change in bowel or urinary habits, change in stools or urine, dysuria,hematuria,  rash, arthralgias, visual complaints, headache, numbness weakness or ataxia or problems with walking or coordination,  change in mood/affect or memory.                 Objective:   Physical Exam  07/19/2012 193 > 08/16/2012 206 >  09/13/2012  187  Elderly amb wm nad RA with nl vital signs    HEENT mild turbinate edema.  Oropharynx no thrush or excess pnd or cobblestoning.  No JVD or cervical adenopathy. Mild accessory muscle hypertrophy. Trachea midline, nl thryroid. Chest was hyperinflated by percussion with diminished breath sounds and moderate increased exp time without wheeze. Hoover sign positive at mid inspiration. Regular rate and rhythm without murmur gallop or rub or increase P2 or edema.  Abd: no hsm, nl excursion. Ext warm without cyanosis or clubbing.    CXR  07/13/2012 :    1. No acute cardiopulmonary abnormalities.  2. Chronic interstitial change of COPD.        Assessment & Plan:   Outpatient Encounter Prescriptions as of 09/13/2012  Medication Sig Dispense Refill  . aspirin EC 81 MG tablet Take 81 mg by mouth daily.      . ciprofloxacin (CIPRO) 250 MG tablet Take 250 mg by mouth 2 (two) times daily.      . fluticasone (FLONASE) 50 MCG/ACT nasal spray Place 2 sprays into the nose every 12 (twelve) hours as needed for rhinitis.      . hydrochlorothiazide (HYDRODIURIL) 12.5 MG tablet Take  12.5 mg by mouth daily.      Marland Kitchen HYDROmorphone (DILAUDID) 4 MG tablet Take 1 tablet (4 mg total) by mouth every 6 (six) hours as needed. Pain  45 tablet  0  . nebivolol (BYSTOLIC) 10 MG tablet Take 10 mg by mouth daily.      . nitroGLYCERIN (NITROSTAT) 0.3 MG SL tablet Place 0.3 mg under the tongue every 5 (five) minutes as needed. For chest pain.      Marland Kitchen oxyCODONE (OXY IR/ROXICODONE) 5 MG immediate release tablet Take 1 tablet (5 mg total) by mouth every 6 (six) hours as needed. Breakthrough pain  30 tablet  0  . polyethylene glycol powder (GLYCOLAX/MIRALAX) powder Take 17 g by mouth daily.      . simvastatin (ZOCOR) 40 MG tablet Take 1 tablet (40 mg total) by mouth at bedtime.  30 tablet  0  . sodium chloride (OCEAN) 0.65 % nasal spray Place 2 sprays into the nose every 4 (four) hours as needed for congestion.      . temazepam (RESTORIL) 15 MG capsule Take 15 mg by mouth at  bedtime as needed for sleep.      Marland Kitchen warfarin (COUMADIN) 5 MG tablet Take 5 mg by mouth.      . [DISCONTINUED] albuterol-ipratropium (COMBIVENT) 18-103 MCG/ACT inhaler Inhale 2 puffs into the lungs every 4 (four) hours as needed for wheezing or shortness of breath.       . Aclidinium Bromide (TUDORZA PRESSAIR) 400 MCG/ACT AEPB Inhale 1 puff into the lungs 2 (two) times daily. One twice daily  1 each  11  . albuterol (PROAIR HFA) 108 (90 BASE) MCG/ACT inhaler Inhale 2 puffs into the lungs every 6 (six) hours as needed for wheezing. 2 puffs every 4 hours as needed only  if your can't catch your breath  1 Inhaler  11  . budesonide-formoterol (SYMBICORT) 160-4.5 MCG/ACT inhaler Take 2 puffs first thing in am and then another 2 puffs about 12 hours later.  1 Inhaler  11  . [DISCONTINUED] Aclidinium Bromide (TUDORZA PRESSAIR) 400 MCG/ACT AEPB Inhale 1 puff into the lungs 2 (two) times daily. One twice daily      . [DISCONTINUED] budesonide-formoterol (SYMBICORT) 160-4.5 MCG/ACT inhaler Take 2 puffs first thing in am and then another 2 puffs about 12 hours later.       No facility-administered encounter medications on file as of 09/13/2012.

## 2012-09-13 NOTE — Assessment & Plan Note (Addendum)
-  med calendar 07/19/2012  - PFTs 08/16/2012 FEV1  1.06 (39%) ratio 37 and no better p B2,  DLCO 47% - tudorza added 08/16/12 - hfa 90% 09/13/2012   DDX of  difficult airways managment all start with A and  include Adherence, Ace Inhibitors, Acid Reflux, Active Sinus Disease, Alpha 1 Antitripsin deficiency, Anxiety masquerading as Airways dz,  ABPA,  allergy(esp in young), Aspiration (esp in elderly), Adverse effects of DPI,  Active smokers, plus two Bs  = Bronchiectasis and Beta blocker use..and one C= CHF   Adherence is always the initial "prime suspect" and is a multilayered concern that requires a "trust but verify" approach in every patient - starting with knowing how to use medications, especially inhalers, correctly, keeping up with refills and understanding the fundamental difference between maintenance and prns vs those medications only taken for a very short course and then stopped and not refilled.  He needs to keep up with refills, discussed  The proper method of use, as well as anticipated side effects, of a metered-dose inhaler are discussed and demonstrated to the patient. Improved effectiveness after extensive coaching during this visit to a level of approximately  90% so should do fine on symbicort and tudorza but change rescue to saba hfa only and d/c combivent

## 2012-09-13 NOTE — Telephone Encounter (Signed)
Ok to continue, will document under chronic resp failure

## 2012-09-13 NOTE — Assessment & Plan Note (Signed)
-   07/13/12 - o2 sat 82% ra---increased to 92% 2 lpm continuous  Sats ok RA 09/13/2012 > ok to d/c if not using

## 2012-09-13 NOTE — Telephone Encounter (Signed)
Message copied by Christen Butter on Tue Sep 13, 2012  3:27 PM ------      Message from: Sandrea Hughs B      Created: Tue Sep 13, 2012  1:21 PM       He does not appear to be using 02 at all at this point - make sure this is true and if so d/c 02 effective today ------

## 2012-09-13 NOTE — Telephone Encounter (Signed)
Pt aware.

## 2012-09-21 ENCOUNTER — Telehealth: Payer: Self-pay | Admitting: Oncology

## 2012-09-21 ENCOUNTER — Ambulatory Visit (HOSPITAL_BASED_OUTPATIENT_CLINIC_OR_DEPARTMENT_OTHER): Payer: Medicare Other | Admitting: Oncology

## 2012-09-21 ENCOUNTER — Other Ambulatory Visit (HOSPITAL_BASED_OUTPATIENT_CLINIC_OR_DEPARTMENT_OTHER): Payer: Medicare Other | Admitting: Lab

## 2012-09-21 ENCOUNTER — Encounter: Payer: Self-pay | Admitting: Oncology

## 2012-09-21 VITALS — BP 96/55 | HR 62 | Temp 98.2°F | Resp 17 | Ht 70.0 in | Wt 188.6 lb

## 2012-09-21 DIAGNOSIS — C20 Malignant neoplasm of rectum: Secondary | ICD-10-CM

## 2012-09-21 LAB — COMPREHENSIVE METABOLIC PANEL (CC13)
Albumin: 2.9 g/dL — ABNORMAL LOW (ref 3.5–5.0)
Alkaline Phosphatase: 71 U/L (ref 40–150)
BUN: 34.6 mg/dL — ABNORMAL HIGH (ref 7.0–26.0)
CO2: 27 mEq/L (ref 22–29)
Glucose: 145 mg/dl — ABNORMAL HIGH (ref 70–99)
Potassium: 4.1 mEq/L (ref 3.5–5.1)

## 2012-09-21 LAB — CBC WITH DIFFERENTIAL/PLATELET
Basophils Absolute: 0 10*3/uL (ref 0.0–0.1)
Eosinophils Absolute: 0.1 10*3/uL (ref 0.0–0.5)
HGB: 11.3 g/dL — ABNORMAL LOW (ref 13.0–17.1)
LYMPH%: 10.1 % — ABNORMAL LOW (ref 14.0–49.0)
MCV: 90.7 fL (ref 79.3–98.0)
MONO#: 0.6 10*3/uL (ref 0.1–0.9)
MONO%: 7.3 % (ref 0.0–14.0)
NEUT#: 6.2 10*3/uL (ref 1.5–6.5)
Platelets: 236 10*3/uL (ref 140–400)

## 2012-09-21 MED ORDER — SODIUM CHLORIDE 0.9 % IJ SOLN
10.0000 mL | INTRAMUSCULAR | Status: DC | PRN
  Administered 2012-09-21: 10 mL via INTRAVENOUS
  Filled 2012-09-21: qty 10

## 2012-09-21 MED ORDER — HEPARIN SOD (PORK) LOCK FLUSH 100 UNIT/ML IV SOLN
500.0000 [IU] | Freq: Once | INTRAVENOUS | Status: AC
  Administered 2012-09-21: 500 [IU] via INTRAVENOUS
  Filled 2012-09-21: qty 5

## 2012-09-21 NOTE — Telephone Encounter (Signed)
gv and printed appt sched and avs for pt  °

## 2012-09-21 NOTE — Progress Notes (Signed)
Hematology and Oncology Follow Up Visit  Vincent [name] [id number] Nov 22, 1932 79 y.o. 09/21/2012 4:06 PM  CC: Velora Heckler, MD  Tracey Harries, M.D.  Iva Boop, MD,FACG  Billie Lade, Ph.D., M.D.    Principle Diagnosis: This is a 79 year old gentleman diagnosed with adenocarcinoma of the rectum.  He had T4a N2 disease diagnosed in 2011.  He presented obstruction of the distal rectal area. Now has local relapse.    Prior Therapy: 1. Status post rectosigmoid resection and segmental resection on February 13, 2010.  Tumor was poorly differentiated adenocarcinoma, 8 out of 16 lymph nodes involved, tumor not completely resected at that time.  2. The patient received radiation therapy adamantly concomitantly with Xeloda.  Therapy concluded in January 2012.  3. Treated with adjuvant FOLFOX for a total of 7 cycles of therapy.  The therapy concluded Sep 16, 2010. 4. FOLFIRI for salvage purposes. He started FOLFIRI on 9/25. He is S/P cycle 8 on 06/28/2012. Chemotherapy was stopped due to disease progression. 5.  Current therapy:  Supportive care only.  Interim History:  Vincent [name] presents today for an office followup visit by himself. He has been doing fair since his last visit. He has not had any abdominal pain. No nausea or vomiting. No problems with constipation.  Continues to have peripheral neuropathy that is unchanged. He denies any headaches, any visual changes, any dysphagia, odynophagia, any nausea, vomiting, diarrhea, constipation, chest pain, shortness of breath, productive cough, abdominal pain, abdominal swelling, lower extremity paresthesia, bowel or bladder incontinence, but reports frequency with urination. States that he was seen by urology yesterday and a urinalysis pending.  Medications: I have reviewed the patient's current medications. Current outpatient prescriptions:Aclidinium Bromide (TUDORZA PRESSAIR) 400 MCG/ACT AEPB, Inhale 1 puff into the lungs 2 (two) times daily. One  twice daily, Disp: 1 each, Rfl: 11;  albuterol (PROAIR HFA) 108 (90 BASE) MCG/ACT inhaler, Inhale 2 puffs into the lungs every 6 (six) hours as needed for wheezing. 2 puffs every 4 hours as needed only  if your can't catch your breath, Disp: 1 Inhaler, Rfl: 11 aspirin EC 81 MG tablet, Take 81 mg by mouth daily., Disp: , Rfl: ;  budesonide-formoterol (SYMBICORT) 160-4.5 MCG/ACT inhaler, Take 2 puffs first thing in am and then another 2 puffs about 12 hours later., Disp: 1 Inhaler, Rfl: 11;  fluticasone (FLONASE) 50 MCG/ACT nasal spray, Place 2 sprays into the nose every 12 (twelve) hours as needed for rhinitis., Disp: , Rfl:  hydrochlorothiazide (HYDRODIURIL) 12.5 MG tablet, Take 12.5 mg by mouth daily., Disp: , Rfl: ;  nebivolol (BYSTOLIC) 10 MG tablet, Take 10 mg by mouth daily., Disp: , Rfl: ;  nitroGLYCERIN (NITROSTAT) 0.3 MG SL tablet, Place 0.3 mg under the tongue every 5 (five) minutes as needed. For chest pain., Disp: , Rfl:  oxyCODONE (OXY IR/ROXICODONE) 5 MG immediate release tablet, Take 1 tablet (5 mg total) by mouth every 6 (six) hours as needed. Breakthrough pain, Disp: 30 tablet, Rfl: 0;  polyethylene glycol powder (GLYCOLAX/MIRALAX) powder, Take 17 g by mouth daily., Disp: , Rfl: ;  simvastatin (ZOCOR) 40 MG tablet, Take 1 tablet (40 mg total) by mouth at bedtime., Disp: 30 tablet, Rfl: 0 sodium chloride (OCEAN) 0.65 % nasal spray, Place 2 sprays into the nose every 4 (four) hours as needed for congestion., Disp: , Rfl: ;  temazepam (RESTORIL) 15 MG capsule, Take 15 mg by mouth at bedtime as needed for sleep., Disp: , Rfl: ;  warfarin (COUMADIN)  5 MG tablet, Take 5 mg by mouth., Disp: , Rfl:  Current facility-administered medications:sodium chloride 0.9 % injection 10 mL, 10 mL, Intravenous, PRN, Myrtis Ser, NP, 10 mL at 09/21/12 1550  Allergies: No Known Allergies  Past Medical History, Surgical history, Social history, and Family History were reviewed and updated.  Review of  Systems: Constitutional:  Negative for fever, chills, night sweats, anorexia, weight loss, pain. Cardiovascular: no chest pain or dyspnea on exertion Respiratory: no cough, shortness of breath, or wheezing Neurological: no TIA or stroke symptoms Dermatological: negative ENT: negative Skin: Negative. Gastrointestinal: no abdominal pain, change in bowel habits, or black or bloody stools Genito-Urinary: no dysuria, trouble voiding, or hematuria Hematological and Lymphatic: negative Breast: negative Musculoskeletal: negative Remaining ROS negative.  Physical Exam: Blood pressure 96/55, pulse 62, temperature 98.2 F (36.8 C), temperature source Oral, resp. rate 17, height 5\' 10"  (1.778 m), weight 188 lb 9.6 oz (85.548 kg). ECOG: 2 General appearance: alert Head: Normocephalic, without obvious abnormality, atraumatic Neck: no adenopathy, no carotid bruit, no JVD, supple, symmetrical, trachea midline and thyroid not enlarged, symmetric, no tenderness/mass/nodules Lymph nodes: Cervical, supraclavicular, and axillary nodes normal. Heart:regular rate and rhythm, S1, S2 normal, no murmur, click, rub or gallop Lung:chest clear, no wheezing, rales, normal symmetric air entry Abdomen: soft, non-tender, without masses or organomegaly EXT:no erythema, induration, or nodules   Lab Results: Lab Results  Component Value Date   WBC 7.7 09/21/2012   HGB 11.3* 09/21/2012   HCT 35.0* 09/21/2012   MCV 90.7 09/21/2012   PLT 236 09/21/2012    Impression and Plan: This is a pleasant 79 year old gentleman with the following issues:   1. Advanced rectal carcinoma, presented with a T4 N2 disease.  He presented with a complete obstruction.  After segmental resection, he underwent radiation therapy with Xeloda and subsequently systemic chemotherapy.  At this time, he has  evidence of  recurrent disease in the pelvic area. After 8 cycles of chemotherapy, he had a scan on 08/02/2012. It appears that his pelvic tumor  has increased in size (I doubt this is an abscess or infection). He has really no good options of treatments. No radiation, surgery or further chemo could help. I suggested hospice and supportive care at this time. I have again discussed hospice and supportive care. He does not want to pursue hospice at this time. We will continue supportive care only. 2. Anemia. Likely multi-factorial. He has no active bleeding. No transfusion is indicated. He remains on ferrous sulfate. 3. Constipation: Improved at this time. 4. Protein calorie malnutrition: Appetite is stable.   5. Port-A-Cath management. Port-A-Cath was flushed today. This will need to be flushed again at his next visit. 6. Atrial fibrillation. Anticoagulated with Coumadin. 7. Hypertension.  Normotensive without medication at this time. 8. DM. On Metformin. 9. Followup: In 2 months for a visit and Port-A-Cath flush.   [place], [place] 5/21/20144:06 PM

## 2012-09-30 ENCOUNTER — Ambulatory Visit (INDEPENDENT_AMBULATORY_CARE_PROVIDER_SITE_OTHER): Payer: Medicare Other | Admitting: Pharmacist Clinician (PhC)/ Clinical Pharmacy Specialist

## 2012-09-30 VITALS — BP 110/70 | HR 84

## 2012-09-30 DIAGNOSIS — Z7901 Long term (current) use of anticoagulants: Secondary | ICD-10-CM

## 2012-09-30 DIAGNOSIS — I4891 Unspecified atrial fibrillation: Secondary | ICD-10-CM

## 2012-09-30 DIAGNOSIS — I480 Paroxysmal atrial fibrillation: Secondary | ICD-10-CM

## 2012-09-30 LAB — POCT INR: INR: 2.2

## 2012-10-24 ENCOUNTER — Telehealth: Payer: Self-pay | Admitting: Pulmonary Disease

## 2012-10-24 DIAGNOSIS — J449 Chronic obstructive pulmonary disease, unspecified: Secondary | ICD-10-CM

## 2012-10-24 MED ORDER — BUDESONIDE-FORMOTEROL FUMARATE 160-4.5 MCG/ACT IN AERO: INHALATION_SPRAY | RESPIRATORY_TRACT | Status: DC

## 2012-10-24 MED ORDER — ALBUTEROL SULFATE HFA 108 (90 BASE) MCG/ACT IN AERS: 2.0000 | INHALATION_SPRAY | Freq: Four times a day (QID) | RESPIRATORY_TRACT | Status: AC | PRN

## 2012-10-24 MED ORDER — ACLIDINIUM BROMIDE 400 MCG/ACT IN AEPB: 1.0000 | INHALATION_SPRAY | Freq: Two times a day (BID) | RESPIRATORY_TRACT | Status: DC

## 2012-10-24 NOTE — Telephone Encounter (Signed)
Spoke with Vincent [name] I advised that I have refilled the proair, tudorza and the symbicort The combivent is not on her med list though, do you want to refill this too? Please advise thanks

## 2012-10-26 NOTE — Telephone Encounter (Signed)
Pt's daughter Durward Mallard states that hospice doesn't cover the tudorza and it's too expensive. Can she pick up a sample of this "asap". She says pt "may not last a week and really needs this asap". Her # [phone]. Hazel Sams

## 2012-10-26 NOTE — Telephone Encounter (Signed)
Samples left up front and Selena Batten is aware

## 2012-10-26 NOTE — Telephone Encounter (Signed)
Pt's daughter, Selena Batten, has called back.  Selena Batten is upset that this has not been addressed yet & wants to speak w/ someone ASAP.  Antionette Fairy

## 2012-10-28 ENCOUNTER — Ambulatory Visit (INDEPENDENT_AMBULATORY_CARE_PROVIDER_SITE_OTHER): Admitting: Pharmacist Clinician (PhC)/ Clinical Pharmacy Specialist

## 2012-10-28 VITALS — BP 142/74 | HR 56

## 2012-10-28 DIAGNOSIS — Z7901 Long term (current) use of anticoagulants: Secondary | ICD-10-CM

## 2012-10-28 DIAGNOSIS — I4891 Unspecified atrial fibrillation: Secondary | ICD-10-CM

## 2012-10-28 DIAGNOSIS — I480 Paroxysmal atrial fibrillation: Secondary | ICD-10-CM

## 2012-10-28 LAB — POCT INR: INR: 1.1

## 2012-11-22 ENCOUNTER — Other Ambulatory Visit: Payer: Medicare Other

## 2012-11-22 ENCOUNTER — Ambulatory Visit (HOSPITAL_BASED_OUTPATIENT_CLINIC_OR_DEPARTMENT_OTHER): Payer: Medicare Other | Admitting: Oncology

## 2012-11-22 VITALS — BP 104/76 | HR 128 | Temp 97.6°F | Resp 20 | Ht 70.0 in | Wt 179.7 lb

## 2012-11-22 DIAGNOSIS — C20 Malignant neoplasm of rectum: Secondary | ICD-10-CM

## 2012-11-22 NOTE — Progress Notes (Signed)
Hematology and Oncology Follow Up Visit  Vincent [name] [id number] 1932-05-05 79 y.o. 11/22/2012 4:19 PM  CC: Vincent Heckler, MD  Vincent [name], M.D.  Vincent Boop, MD,FACG  Billie Lade, Ph.D., M.D.    Principle Diagnosis: This is a 79 year old gentleman diagnosed with adenocarcinoma of the rectum.  He had T4a N2 disease diagnosed in 2011.  He presented obstruction of the distal rectal area. Now has local relapse.    Prior Therapy: 1. Status post rectosigmoid resection and segmental resection on February 13, 2010.  Tumor was poorly differentiated adenocarcinoma, 8 out of 16 lymph nodes involved, tumor not completely resected at that time.  2. The patient received radiation therapy adamantly concomitantly with Xeloda.  Therapy concluded in January 2012.  3. Treated with adjuvant FOLFOX for a total of 7 cycles of therapy.  The therapy concluded Sep 16, 2010. 4. FOLFIRI for salvage purposes. He started FOLFIRI on 9/25. He is S/P cycle 8 on 06/28/2012. Chemotherapy was stopped due to disease progression. 5.  Current therapy:  Supportive care only.  Interim History:  [name] presents today for an office followup visit by himself. He has been doing poorly since the  last visit. He has not had any abdominal pain. No nausea or vomiting. No problems with constipation.  Continues to have peripheral neuropathy that is unchanged. He denies any headaches, any visual changes, any dysphagia, odynophagia, any nausea, vomiting, diarrhea, constipation, chest pain. He is under hospice care now and has very limited mobility. He uses a cane but spends most of the day in a chair.   Medications: I have reviewed the patient's current medications. Current Outpatient Prescriptions  Medication Sig Dispense Refill  . Aclidinium Bromide (TUDORZA PRESSAIR) 400 MCG/ACT AEPB Inhale 1 puff into the lungs 2 (two) times daily. One twice daily  1 each  11  . albuterol (PROAIR HFA) 108 (90 BASE) MCG/ACT inhaler Inhale 2  puffs into the lungs every 6 (six) hours as needed for wheezing. 2 puffs every 4 hours as needed only  if your can't catch your breath  1 Inhaler  11  . fluticasone (FLONASE) 50 MCG/ACT nasal spray Place 2 sprays into the nose every 12 (twelve) hours as needed for rhinitis.      . hydrochlorothiazide (HYDRODIURIL) 12.5 MG tablet Take 12.5 mg by mouth daily.      . nebivolol (BYSTOLIC) 10 MG tablet Take 10 mg by mouth daily.      . nitroGLYCERIN (NITROSTAT) 0.3 MG SL tablet Place 0.3 mg under the tongue every 5 (five) minutes as needed. For chest pain.      Marland Kitchen oxyCODONE (OXY IR/ROXICODONE) 5 MG immediate release tablet Take 1 tablet (5 mg total) by mouth every 6 (six) hours as needed. Breakthrough pain  30 tablet  0  . polyethylene glycol powder (GLYCOLAX/MIRALAX) powder Take 17 g by mouth daily.      . simvastatin (ZOCOR) 40 MG tablet Take 1 tablet (40 mg total) by mouth at bedtime.  30 tablet  0  . temazepam (RESTORIL) 15 MG capsule Take 15 mg by mouth at bedtime as needed for sleep.      . COMBIVENT RESPIMAT 20-100 MCG/ACT AERS respimat Inhale 1 puff into the lungs every 6 (six) hours.      . sodium chloride (OCEAN) 0.65 % nasal spray Place 2 sprays into the nose every 4 (four) hours as needed for congestion.       No current facility-administered medications for this visit.  Allergies: No Known Allergies  Past Medical History, Surgical history, Social history, and Family History were reviewed and updated.  Review of Systems: Remaining ROS negative.  Physical Exam: Blood pressure 104/76, pulse 128, temperature 97.6 F (36.4 C), temperature source Oral, resp. rate 20, height 5\' 10"  (1.778 m), weight 179 lb 11.2 oz (81.511 kg). ECOG: 3 General appearance: alert Head: Normocephalic, without obvious abnormality, atraumatic Neck: no adenopathy, no carotid bruit, no JVD, supple, symmetrical, trachea midline and thyroid not enlarged, symmetric, no tenderness/mass/nodules Lymph nodes:  Cervical, supraclavicular, and axillary nodes normal. Heart:regular rate and rhythm, S1, S2 normal, no murmur, click, rub or gallop Lung:expiratory wheezes noted.  Abdomen: soft, non-tender, without masses or organomegaly EXT:no erythema, induration, or nodules     Impression and Plan: This is a pleasant 79 year old gentleman with the following issues:   1. Advanced rectal carcinoma, presented with a T4 N2 disease/ It appears that his pelvic tumor has increased in size (I doubt this is an abscess or infection). He has really no good options of treatments. No radiation, surgery or further chemo could help. I agree with supportive care only and the current hospice care.  2. Followup: as needed. I anticipate soon he will not be able to make it out of the house and will relay on hospice for updates.    [name] 7/22/20144:19 PM

## 2012-11-29 ENCOUNTER — Telehealth: Payer: Self-pay | Admitting: Internal Medicine

## 2012-11-29 MED ORDER — ACLIDINIUM BROMIDE 400 MCG/ACT IN AEPB: 1.0000 | INHALATION_SPRAY | Freq: Two times a day (BID) | RESPIRATORY_TRACT | Status: AC

## 2012-11-29 NOTE — Telephone Encounter (Signed)
Spoke to pt's daughter. Pt actually needs samples of Tudorza. She is aware that I will leave samples up front for pick up.

## 2012-12-14 ENCOUNTER — Encounter: Payer: Medicare Other | Admitting: Adult Health

## 2012-12-23 ENCOUNTER — Encounter: Payer: Medicare Other | Admitting: Adult Health

## 2013-02-01 DEATH — deceased

## 2013-06-04 IMAGING — CT CT ABD-PELV W/ CM
2 of 5 series · 17 of 46 positions shown, 19 images · IV contrast (omnipaque)
Comparison: 12/21/2011

CLINICAL DATA: Follow up abscess drain placed 12/23/2011.

CT ABDOMEN AND PELVIS WITH CONTRAST
TECHNIQUE: Multidetector CT imaging of the abdomen and pelvis was
performed following the standard protocol during bolus
administration of intravenous contrast.
Contrast: 100mL OMNIPAQUE IOHEXOL 300 MG/ML  SOLN

[Series 2: rtn a/p with · axial · 0.82mm/px · z∈[-502,-128]mm · 14 of 85 slices shown, 16 images]
[im 5/85  soft-tissue]
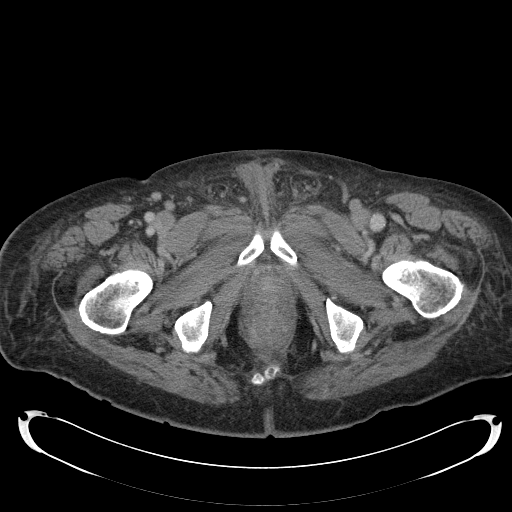
[im 5/85  bone]
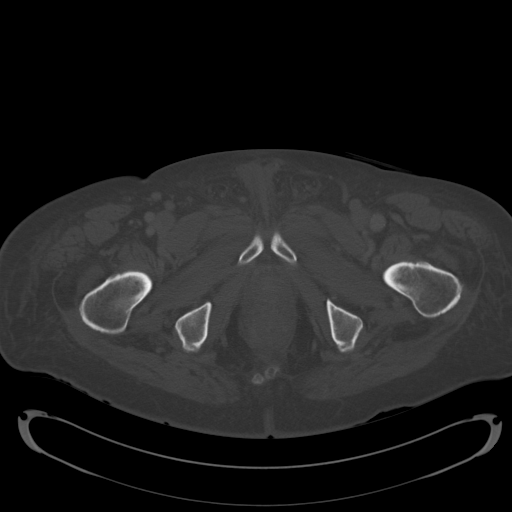
[im 9/85  soft-tissue]
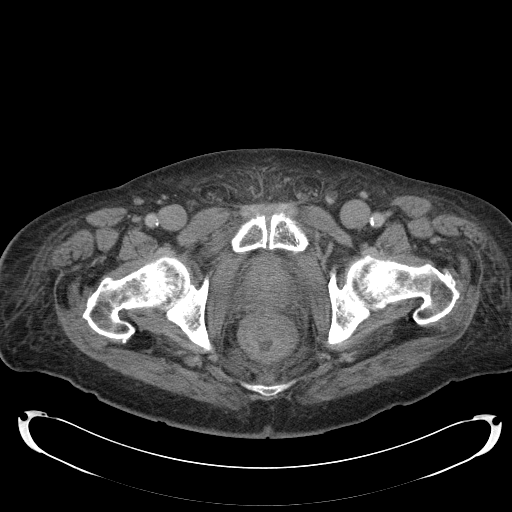
[im 18/85  soft-tissue]
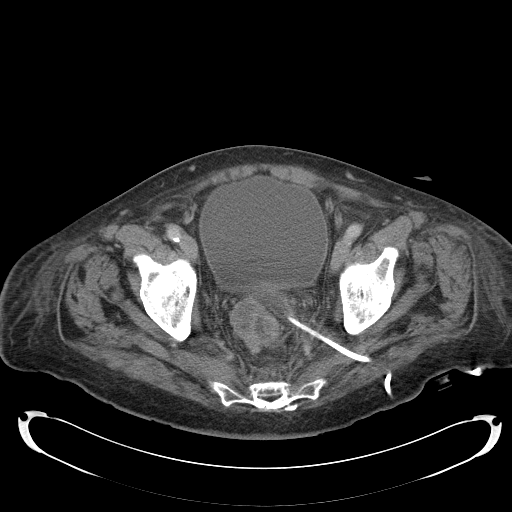
[im 23/85  soft-tissue]
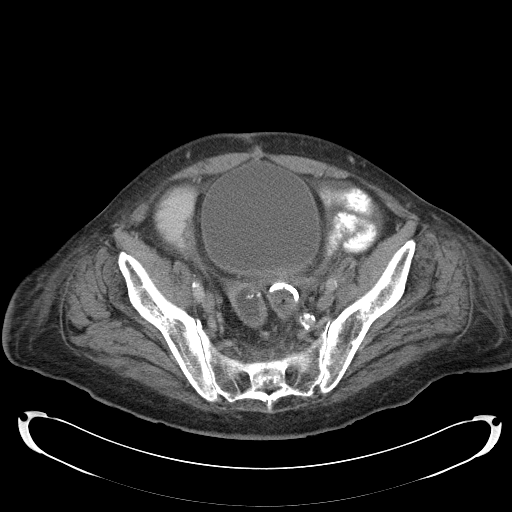
[im 27/85  soft-tissue]
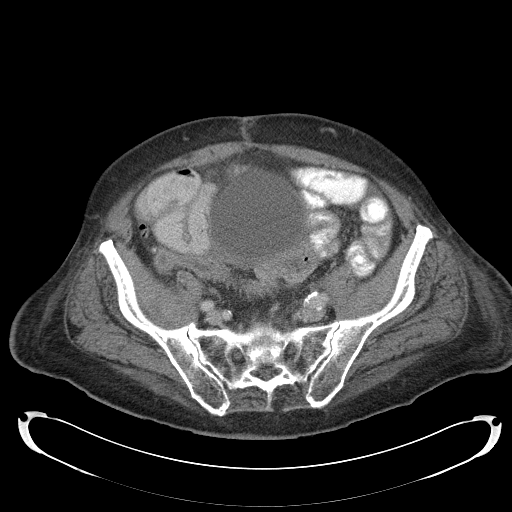
[im 36/85  soft-tissue]
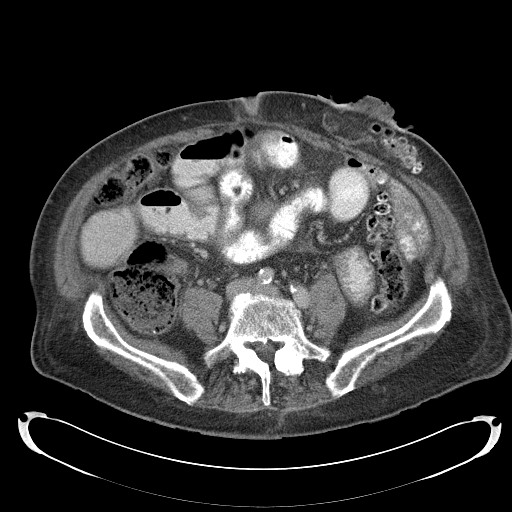
[im 40/85  soft-tissue]
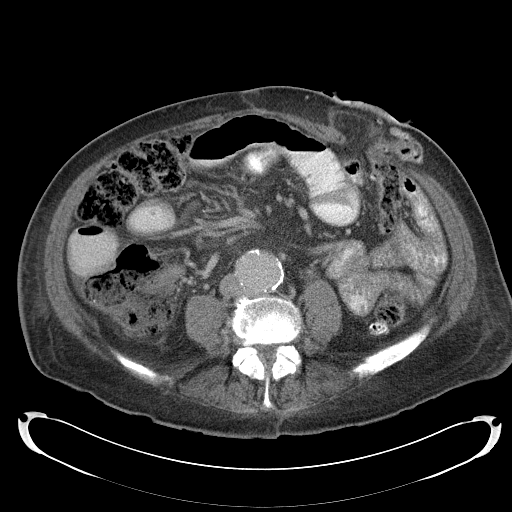
[im 45/85  soft-tissue]
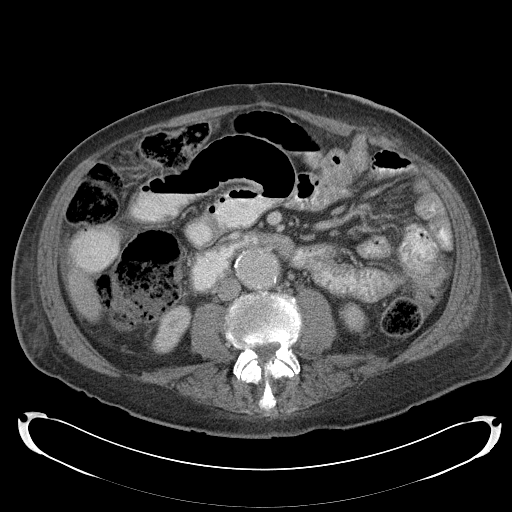
[im 49/85  soft-tissue]
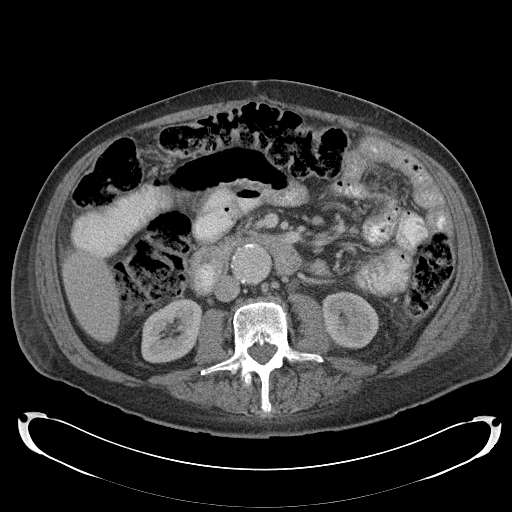
[im 49/85  bone]
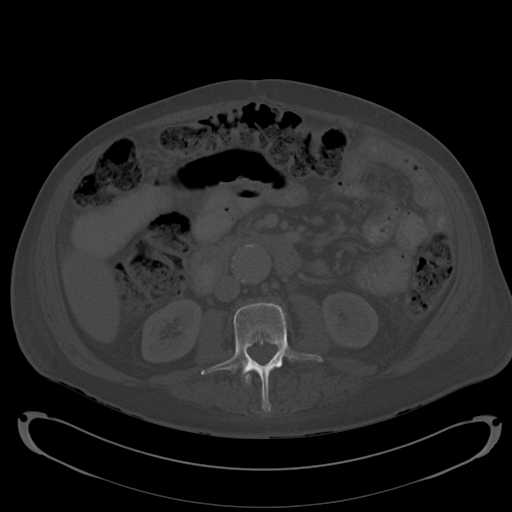
[im 58/85  soft-tissue]
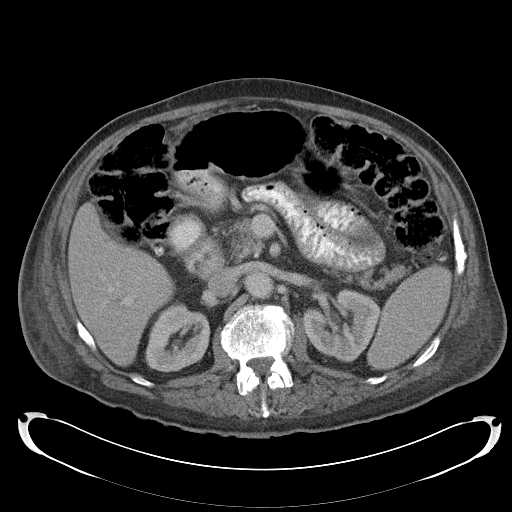
[im 62/85  soft-tissue]
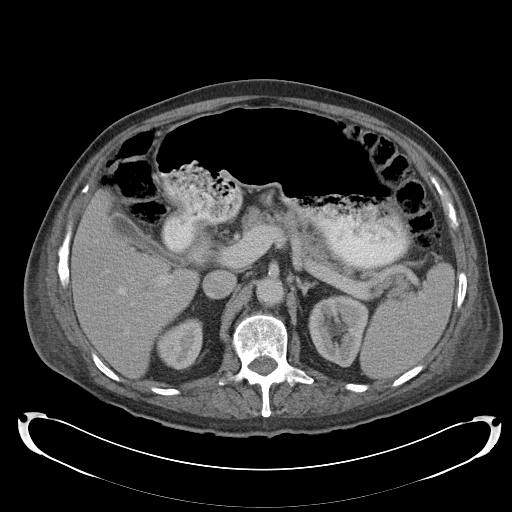
[im 67/85  soft-tissue]
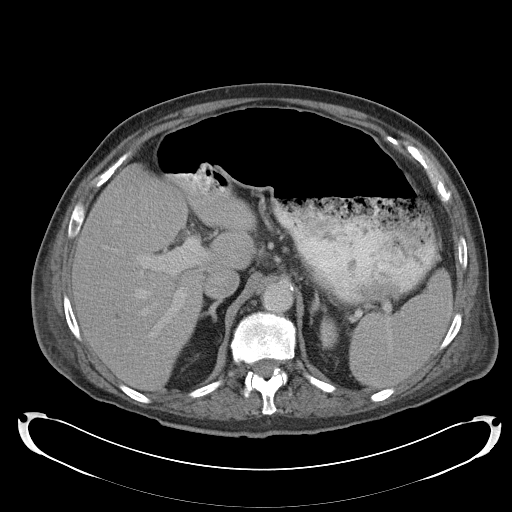
[im 76/85  soft-tissue]
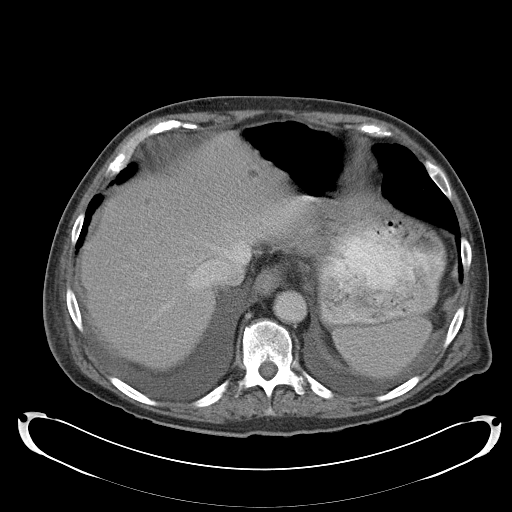
[im 80/85  soft-tissue]
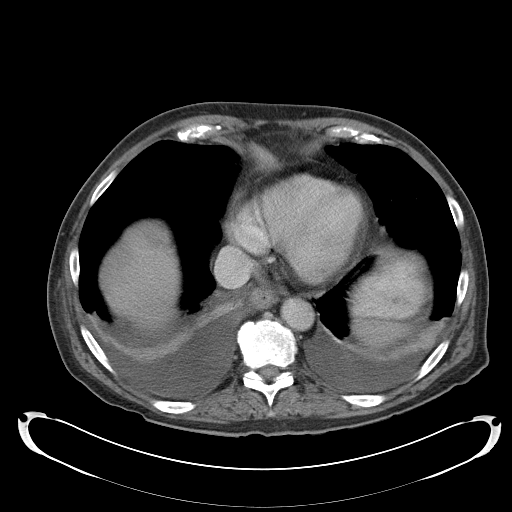

[Series 602: <mpr thick range> · coronal · 0.83mm/px · 3 of 93 slices shown]
[im 31/93  soft-tissue]
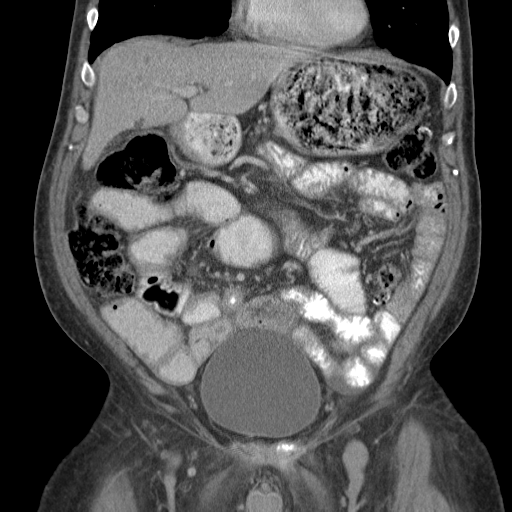
[im 41/93  soft-tissue]
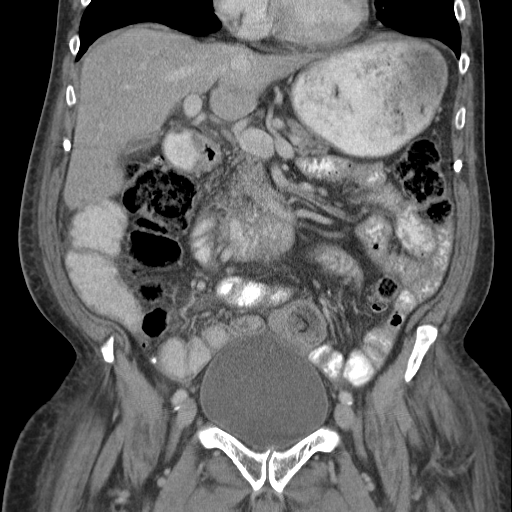
[im 52/93  soft-tissue]
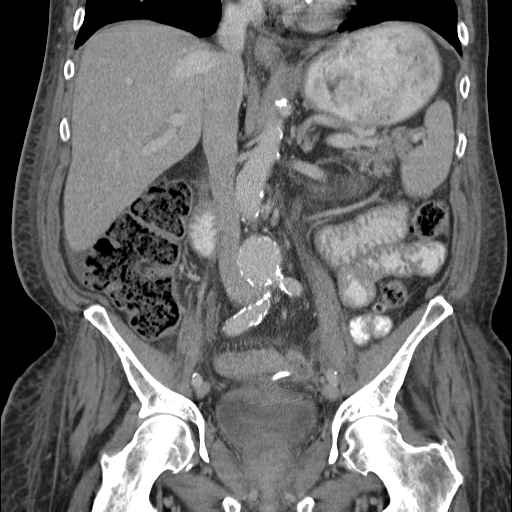

[17 of 46 positions shown; findings below may reference images not displayed]

FINDINGS: Moderate bilateral pleural effusions, right greater than
left, mildly increased.  Associated lower lobe atelectasis.

Multiple small probable hepatic cysts, unchanged.

Spleen, pancreas, and adrenal glands are within normal limits.

Gallbladder is underdistended.  No intrahepatic or extrahepatic
ductal dilatation.

1.3 cm posterior interpolar left renal cyst.  Mild fullness of the
left renal collecting system.  Right kidney is unremarkable.  No
frank hydronephrosis.

No evidence of bowel obstruction.  Left lower quadrant colostomy.
Stable rectal mass.  Right perirectal lymph nodes, unchanged.

1.5 x 2.6 cm anterior perirectal fluid collection (series 2/image
73), previously 2.4 x 3.8 cm, decreased.  Adjacent residual 2.4 x
2.0 cm left pericolonic fluid collection with indwelling pigtail
drain, previously 2.8 x 5.1 cm, improved.

Atherosclerotic calcifications of the abdominal aorta and branch
vessels.  Stable bilobed infrarenal abdominal aortic aneurysm.

Prostate and bladder are unremarkable.

Degenerative changes of the visualized thoracolumbar spine.
IMPRESSION: Residual 2.4 x 2.0 cm left pericolonic fluid collection with
indwelling pigtail drain, improved.

Additional 1.5 x 2.6 cm anterior perirectal fluid collection,
decreased.

Recurrent rectal tumor with adjacent perirectal nodal metastases,
unchanged.

Multiple additional stable ancillary findings as above.

## 2013-08-15 IMAGING — CR DG ABD PORTABLE 1V
1 series · 2 of 2 positions shown · non-contrast
Comparison: Two-view abdomen x-ray 03/04/2012, 03/03/2012.  CT
abdomen pelvis 03/01/2012.

CLINICAL DATA: Abdominal pain and distention.  Nausea.  Follow up
small bowel obstruction.

PORTABLE ABDOMEN - 1 VIEW [DATE]/2471 2472 hours:

[Series 1: ap (kub) · U · 2 of 2 slices shown]
[im 1/2]
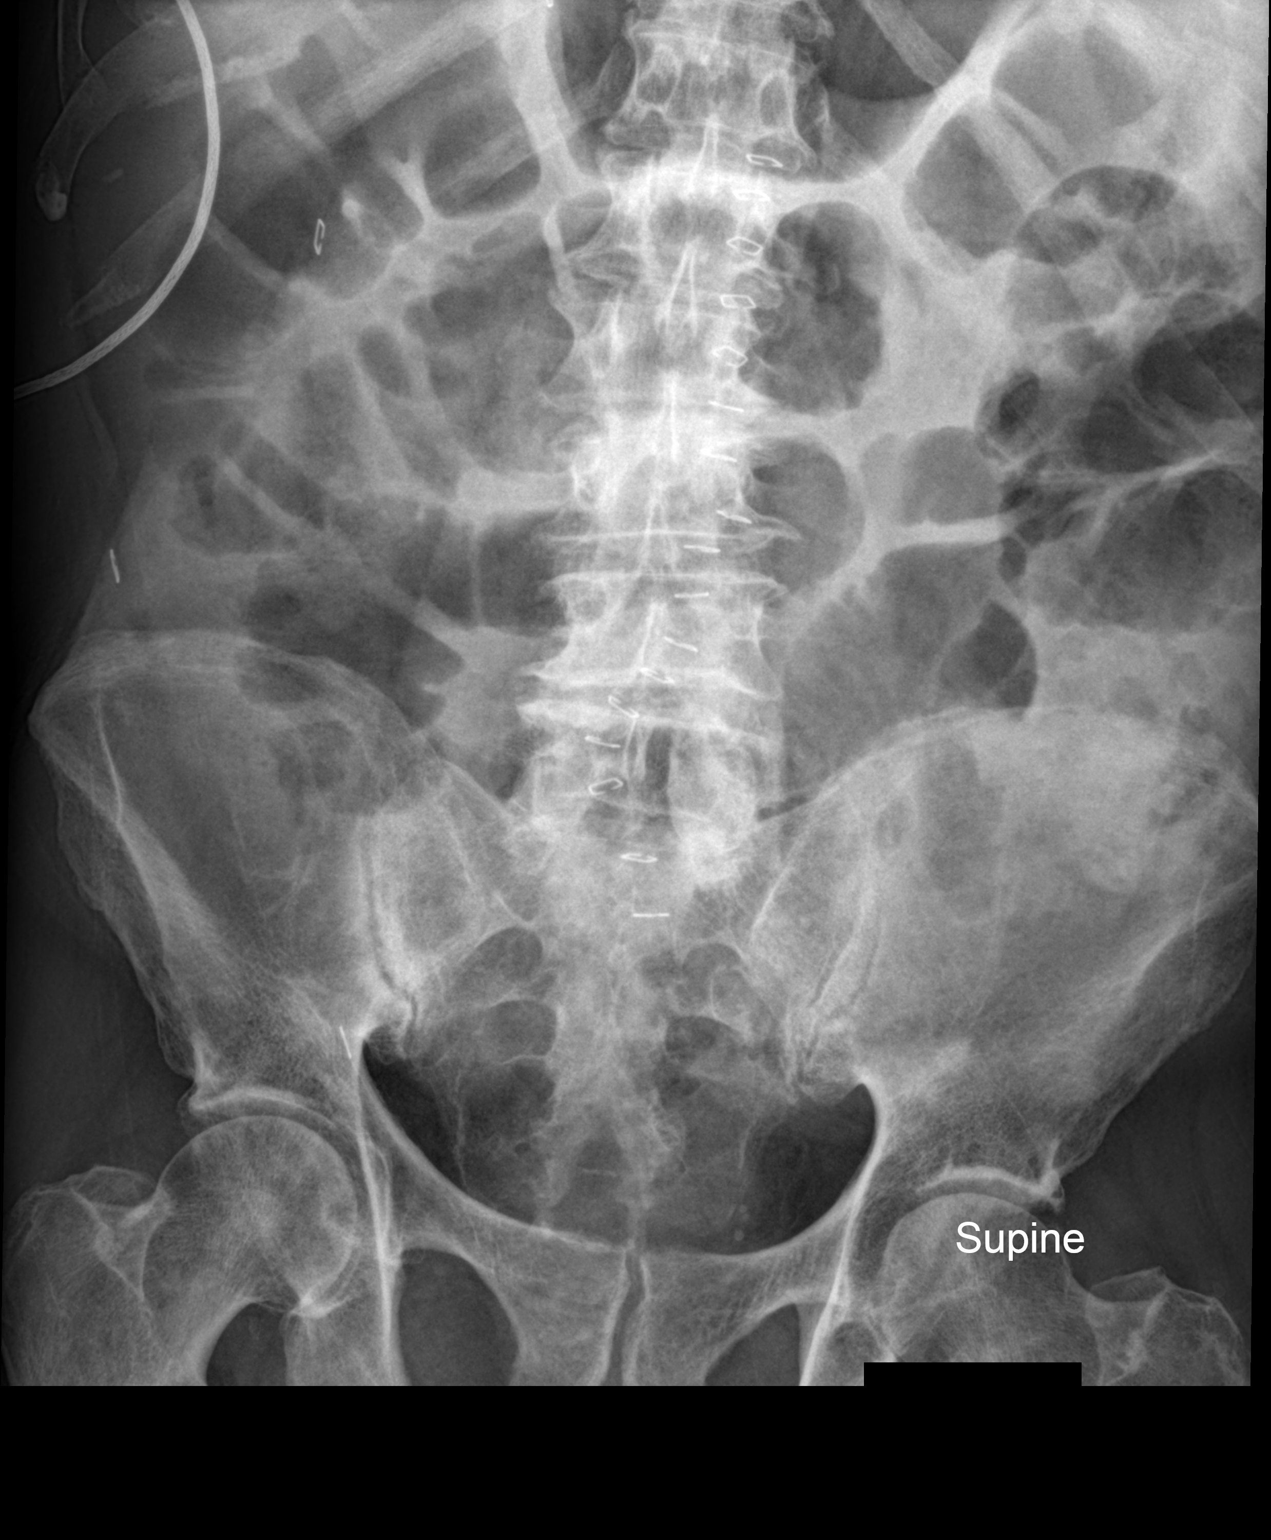
[im 2/2]
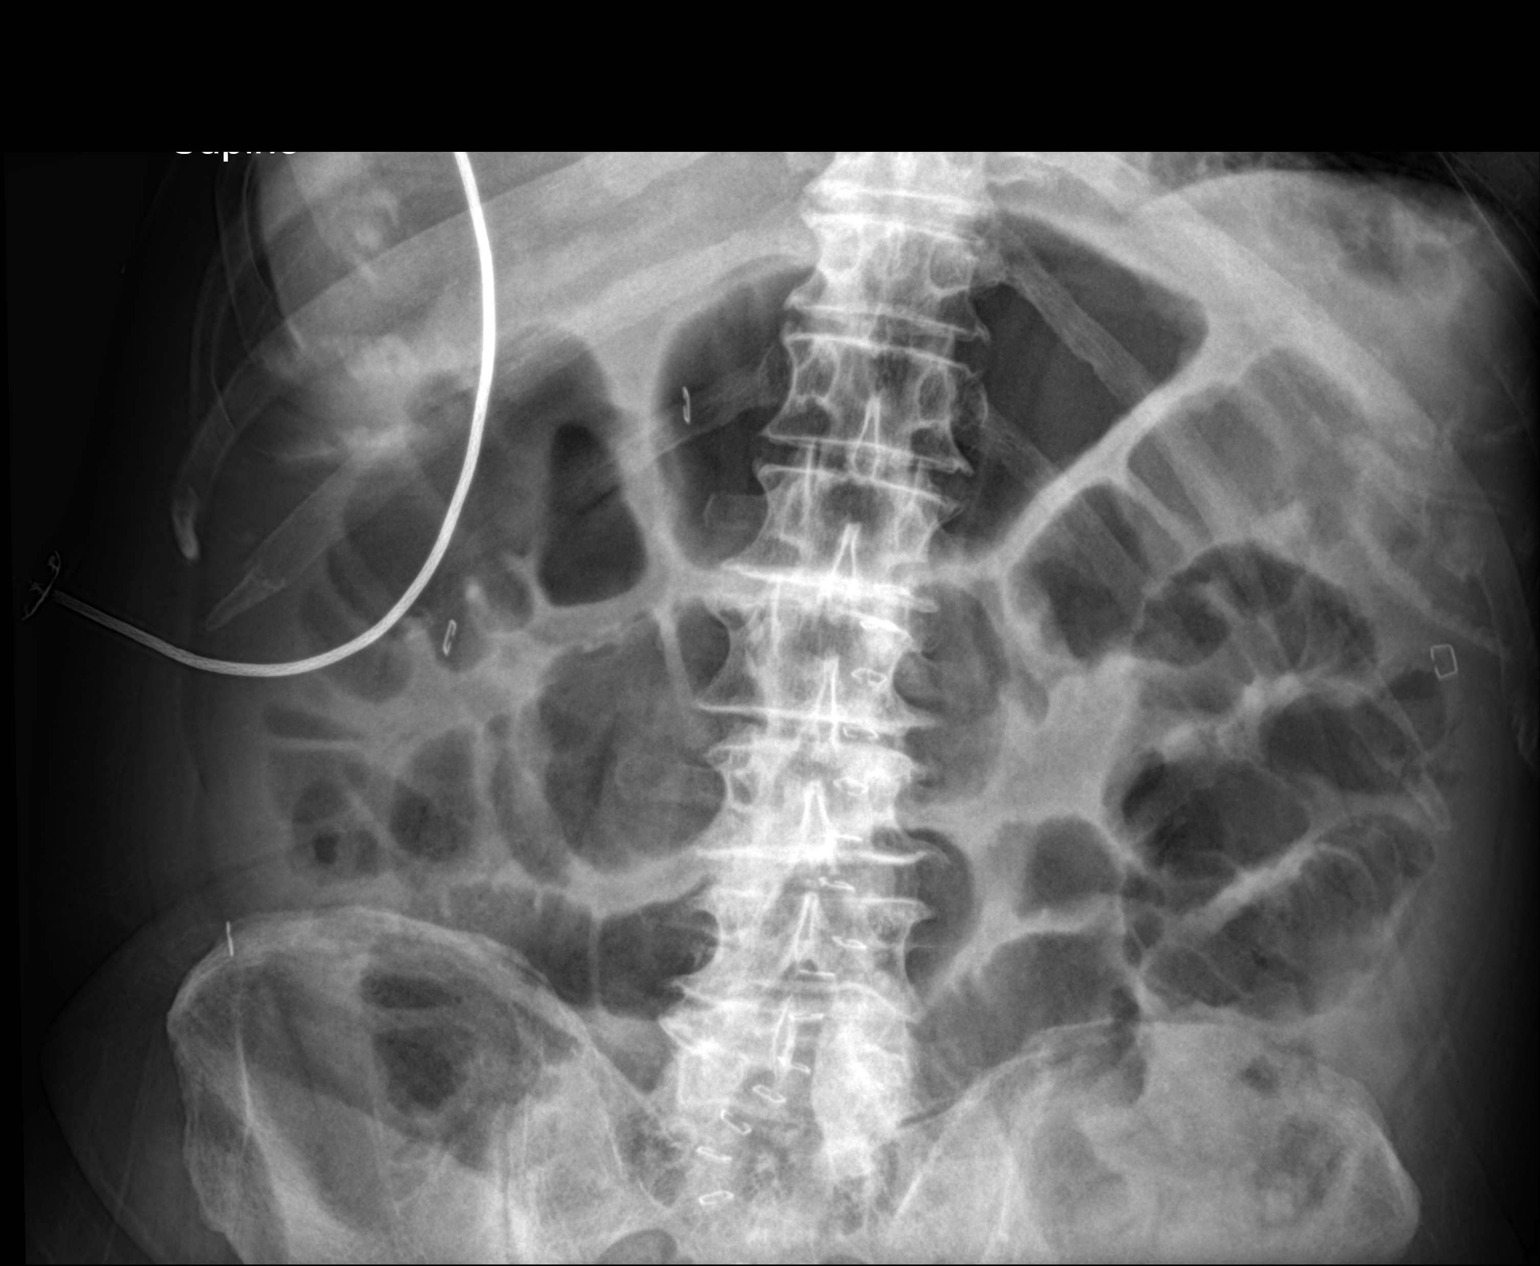

[2 of 2 positions shown; findings below may reference images not displayed]

FINDINGS: Interval laparotomy since the examination 4 days ago.
Mild gaseous distention of multiple loops of small bowel and in the
transverse colon.  No evidence of free intraperitoneal air on the
supine image.
IMPRESSION: Post-operative ileus.

## 2013-09-11 ENCOUNTER — Telehealth: Payer: Self-pay

## 2013-09-11 NOTE — Telephone Encounter (Signed)
Patient past away @ Home per [facility]bituary  °

## 2013-12-26 ENCOUNTER — Encounter: Payer: Self-pay | Admitting: *Deleted

## 2013-12-26 ENCOUNTER — Other Ambulatory Visit: Payer: Self-pay | Admitting: *Deleted
